# Patient Record
Sex: Female | Born: 1945 | Race: White | Hispanic: No | Marital: Married | Smoking: Former smoker
Health system: Southern US, Community
[De-identification: ages and names within clinical notes are randomized; demographics above are authoritative.]

## PROBLEM LIST (undated history)

## (undated) DIAGNOSIS — F32A Depression, unspecified: Secondary | ICD-10-CM

## (undated) DIAGNOSIS — R011 Cardiac murmur, unspecified: Secondary | ICD-10-CM

## (undated) DIAGNOSIS — C4491 Basal cell carcinoma of skin, unspecified: Secondary | ICD-10-CM

## (undated) DIAGNOSIS — I499 Cardiac arrhythmia, unspecified: Secondary | ICD-10-CM

## (undated) DIAGNOSIS — J40 Bronchitis, not specified as acute or chronic: Secondary | ICD-10-CM

## (undated) DIAGNOSIS — I341 Nonrheumatic mitral (valve) prolapse: Secondary | ICD-10-CM

## (undated) DIAGNOSIS — F329 Major depressive disorder, single episode, unspecified: Secondary | ICD-10-CM

## (undated) DIAGNOSIS — E785 Hyperlipidemia, unspecified: Secondary | ICD-10-CM

## (undated) DIAGNOSIS — F419 Anxiety disorder, unspecified: Secondary | ICD-10-CM

## (undated) DIAGNOSIS — C801 Malignant (primary) neoplasm, unspecified: Secondary | ICD-10-CM

## (undated) DIAGNOSIS — I471 Supraventricular tachycardia: Secondary | ICD-10-CM

## (undated) DIAGNOSIS — I10 Essential (primary) hypertension: Secondary | ICD-10-CM

## (undated) DIAGNOSIS — M1990 Unspecified osteoarthritis, unspecified site: Secondary | ICD-10-CM

## (undated) DIAGNOSIS — C439 Malignant melanoma of skin, unspecified: Secondary | ICD-10-CM

## (undated) DIAGNOSIS — L570 Actinic keratosis: Secondary | ICD-10-CM

## (undated) DIAGNOSIS — Z85820 Personal history of malignant melanoma of skin: Secondary | ICD-10-CM

## (undated) DIAGNOSIS — J45909 Unspecified asthma, uncomplicated: Secondary | ICD-10-CM

## (undated) DIAGNOSIS — A084 Viral intestinal infection, unspecified: Secondary | ICD-10-CM

## (undated) DIAGNOSIS — D229 Melanocytic nevi, unspecified: Secondary | ICD-10-CM

## (undated) HISTORY — DX: Personal history of malignant melanoma of skin: Z85.820

## (undated) HISTORY — DX: Unspecified asthma, uncomplicated: J45.909

## (undated) HISTORY — DX: Anxiety disorder, unspecified: F41.9

## (undated) HISTORY — DX: Viral intestinal infection, unspecified: A08.4

## (undated) HISTORY — DX: Depression, unspecified: F32.A

## (undated) HISTORY — DX: Nonrheumatic mitral (valve) prolapse: I34.1

## (undated) HISTORY — DX: Hyperlipidemia, unspecified: E78.5

## (undated) HISTORY — DX: Essential (primary) hypertension: I10

## (undated) HISTORY — DX: Actinic keratosis: L57.0

## (undated) HISTORY — DX: Supraventricular tachycardia: I47.1

## (undated) HISTORY — DX: Major depressive disorder, single episode, unspecified: F32.9

---
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
## 1973-02-09 HISTORY — PX: ABDOMINAL HYSTERECTOMY: SHX81

## 1974-02-09 HISTORY — PX: ENDOMETRIAL ABLATION: SHX621

## 1978-06-27 LAB — HM PAP SMEAR

## 1997-02-09 HISTORY — PX: MELANOMA EXCISION: SHX5266

## 1997-10-18 DIAGNOSIS — C439 Malignant melanoma of skin, unspecified: Secondary | ICD-10-CM

## 1997-10-18 HISTORY — DX: Malignant melanoma of skin, unspecified: C43.9

## 1997-10-31 ENCOUNTER — Ambulatory Visit (HOSPITAL_BASED_OUTPATIENT_CLINIC_OR_DEPARTMENT_OTHER): Admission: RE | Admit: 1997-10-31 | Discharge: 1997-10-31 | Payer: Self-pay | Admitting: Surgery

## 1999-08-04 ENCOUNTER — Encounter: Payer: Self-pay | Admitting: Obstetrics and Gynecology

## 1999-08-04 ENCOUNTER — Encounter: Admission: RE | Admit: 1999-08-04 | Discharge: 1999-08-04 | Payer: Self-pay | Admitting: Obstetrics and Gynecology

## 2000-08-05 ENCOUNTER — Encounter: Payer: Self-pay | Admitting: Pulmonary Disease

## 2000-08-05 ENCOUNTER — Encounter: Admission: RE | Admit: 2000-08-05 | Discharge: 2000-08-05 | Payer: Self-pay | Admitting: Pulmonary Disease

## 2001-06-10 ENCOUNTER — Encounter: Payer: Self-pay | Admitting: Pulmonary Disease

## 2001-06-10 ENCOUNTER — Encounter: Admission: RE | Admit: 2001-06-10 | Discharge: 2001-06-10 | Payer: Self-pay | Admitting: Pulmonary Disease

## 2001-12-15 ENCOUNTER — Ambulatory Visit (HOSPITAL_COMMUNITY): Admission: RE | Admit: 2001-12-15 | Discharge: 2001-12-15 | Payer: Self-pay

## 2002-06-21 ENCOUNTER — Encounter: Payer: Self-pay | Admitting: Pulmonary Disease

## 2002-06-21 ENCOUNTER — Encounter: Admission: RE | Admit: 2002-06-21 | Discharge: 2002-06-21 | Payer: Self-pay | Admitting: Pulmonary Disease

## 2002-10-23 DIAGNOSIS — C4491 Basal cell carcinoma of skin, unspecified: Secondary | ICD-10-CM

## 2002-10-23 HISTORY — DX: Basal cell carcinoma of skin, unspecified: C44.91

## 2003-05-14 ENCOUNTER — Ambulatory Visit (HOSPITAL_COMMUNITY): Admission: RE | Admit: 2003-05-14 | Discharge: 2003-05-14 | Payer: Self-pay | Admitting: Pulmonary Disease

## 2003-06-27 LAB — HM COLONOSCOPY: HM Colonoscopy: NORMAL

## 2004-04-04 ENCOUNTER — Ambulatory Visit: Payer: Self-pay | Admitting: Gastroenterology

## 2004-04-15 ENCOUNTER — Ambulatory Visit: Payer: Self-pay | Admitting: Gastroenterology

## 2004-04-15 HISTORY — PX: COLONOSCOPY: SHX174

## 2004-06-26 ENCOUNTER — Ambulatory Visit: Payer: Self-pay | Admitting: Pulmonary Disease

## 2005-03-16 ENCOUNTER — Ambulatory Visit: Payer: Self-pay | Admitting: Pulmonary Disease

## 2005-03-20 ENCOUNTER — Ambulatory Visit (HOSPITAL_COMMUNITY): Admission: RE | Admit: 2005-03-20 | Discharge: 2005-03-20 | Payer: Self-pay | Admitting: Pulmonary Disease

## 2005-07-28 ENCOUNTER — Ambulatory Visit: Payer: Self-pay | Admitting: Pulmonary Disease

## 2005-07-28 ENCOUNTER — Ambulatory Visit (HOSPITAL_COMMUNITY): Admission: RE | Admit: 2005-07-28 | Discharge: 2005-07-28 | Payer: Self-pay | Admitting: Pulmonary Disease

## 2005-07-30 ENCOUNTER — Ambulatory Visit: Payer: Self-pay | Admitting: Unknown Physician Specialty

## 2005-08-28 ENCOUNTER — Ambulatory Visit (HOSPITAL_COMMUNITY): Admission: RE | Admit: 2005-08-28 | Discharge: 2005-08-28 | Payer: Self-pay | Admitting: Orthopedic Surgery

## 2006-04-23 ENCOUNTER — Ambulatory Visit: Payer: Self-pay | Admitting: Pulmonary Disease

## 2006-04-23 LAB — CONVERTED CEMR LAB
ALT: 14 units/L (ref 0–40)
Basophils Absolute: 0 10*3/uL (ref 0.0–0.1)
Basophils Relative: 0.5 % (ref 0.0–1.0)
Bilirubin Urine: NEGATIVE
Bilirubin, Direct: 0.1 mg/dL (ref 0.0–0.3)
Calcium: 10.1 mg/dL (ref 8.4–10.5)
Chloride: 104 meq/L (ref 96–112)
Cholesterol: 215 mg/dL (ref 0–200)
Creatinine, Ser: 0.9 mg/dL (ref 0.4–1.2)
Crystals: NEGATIVE
Eosinophils Absolute: 0 10*3/uL (ref 0.0–0.6)
Eosinophils Relative: 0.6 % (ref 0.0–5.0)
GFR calc Af Amer: 82 mL/min
GFR calc non Af Amer: 68 mL/min
Glucose, Bld: 87 mg/dL (ref 70–99)
HCT: 40.7 % (ref 36.0–46.0)
Hemoglobin, Urine: NEGATIVE
Hemoglobin: 14 g/dL (ref 12.0–15.0)
MCHC: 34.5 g/dL (ref 30.0–36.0)
Neutro Abs: 3 10*3/uL (ref 1.4–7.7)
Nitrite: NEGATIVE
RBC: 4.27 M/uL (ref 3.87–5.11)
RDW: 12.1 % (ref 11.5–14.6)
Sodium: 143 meq/L (ref 135–145)
Specific Gravity, Urine: 1.015 (ref 1.000–1.03)
Total CHOL/HDL Ratio: 2.5
Urine Glucose: NEGATIVE mg/dL
Urobilinogen, UA: 0.2 (ref 0.0–1.0)
pH: 7.5 (ref 5.0–8.0)

## 2006-06-02 ENCOUNTER — Ambulatory Visit: Payer: Self-pay | Admitting: Pulmonary Disease

## 2007-06-24 ENCOUNTER — Telehealth (INDEPENDENT_AMBULATORY_CARE_PROVIDER_SITE_OTHER): Payer: Self-pay | Admitting: *Deleted

## 2007-08-19 ENCOUNTER — Encounter: Payer: Self-pay | Admitting: Pulmonary Disease

## 2007-08-26 ENCOUNTER — Encounter: Payer: Self-pay | Admitting: Pulmonary Disease

## 2007-11-10 HISTORY — PX: OTHER SURGICAL HISTORY: SHX169

## 2007-11-30 ENCOUNTER — Ambulatory Visit (HOSPITAL_BASED_OUTPATIENT_CLINIC_OR_DEPARTMENT_OTHER): Admission: RE | Admit: 2007-11-30 | Discharge: 2007-11-30 | Payer: Self-pay | Admitting: *Deleted

## 2008-02-06 ENCOUNTER — Ambulatory Visit: Payer: Self-pay | Admitting: Pulmonary Disease

## 2008-02-06 ENCOUNTER — Telehealth (INDEPENDENT_AMBULATORY_CARE_PROVIDER_SITE_OTHER): Payer: Self-pay | Admitting: *Deleted

## 2008-02-06 DIAGNOSIS — I10 Essential (primary) hypertension: Secondary | ICD-10-CM

## 2008-02-06 DIAGNOSIS — M810 Age-related osteoporosis without current pathological fracture: Secondary | ICD-10-CM

## 2008-02-06 DIAGNOSIS — J42 Unspecified chronic bronchitis: Secondary | ICD-10-CM

## 2008-02-06 DIAGNOSIS — A088 Other specified intestinal infections: Secondary | ICD-10-CM | POA: Insufficient documentation

## 2008-02-06 DIAGNOSIS — Z85820 Personal history of malignant melanoma of skin: Secondary | ICD-10-CM

## 2008-02-06 DIAGNOSIS — Z8679 Personal history of other diseases of the circulatory system: Secondary | ICD-10-CM | POA: Insufficient documentation

## 2008-02-06 DIAGNOSIS — K649 Unspecified hemorrhoids: Secondary | ICD-10-CM | POA: Insufficient documentation

## 2008-02-06 DIAGNOSIS — E785 Hyperlipidemia, unspecified: Secondary | ICD-10-CM | POA: Insufficient documentation

## 2008-02-06 DIAGNOSIS — F489 Nonpsychotic mental disorder, unspecified: Secondary | ICD-10-CM | POA: Insufficient documentation

## 2008-02-06 LAB — CONVERTED CEMR LAB
AST: 23 units/L (ref 0–37)
Albumin: 3.8 g/dL (ref 3.5–5.2)
BUN: 16 mg/dL (ref 6–23)
Basophils Absolute: 0.1 10*3/uL (ref 0.0–0.1)
Basophils Relative: 0.5 % (ref 0.0–3.0)
Bilirubin, Direct: 0.2 mg/dL (ref 0.0–0.3)
Calcium: 9.2 mg/dL (ref 8.4–10.5)
Chloride: 104 meq/L (ref 96–112)
Eosinophils Absolute: 0 10*3/uL (ref 0.0–0.7)
GFR calc Af Amer: 81 mL/min
Monocytes Relative: 4.8 % (ref 3.0–12.0)
Platelets: 143 10*3/uL — ABNORMAL LOW (ref 150–400)
Sed Rate: 22 mm/hr (ref 0–22)
Sodium: 139 meq/L (ref 135–145)
TSH: 1.84 microintl units/mL (ref 0.35–5.50)
Total Bilirubin: 1.2 mg/dL (ref 0.3–1.2)
Total Protein: 7 g/dL (ref 6.0–8.3)

## 2008-04-10 ENCOUNTER — Telehealth (INDEPENDENT_AMBULATORY_CARE_PROVIDER_SITE_OTHER): Payer: Self-pay | Admitting: *Deleted

## 2008-04-10 ENCOUNTER — Telehealth: Payer: Self-pay | Admitting: Pulmonary Disease

## 2008-04-10 ENCOUNTER — Ambulatory Visit: Payer: Self-pay | Admitting: Pulmonary Disease

## 2008-04-10 DIAGNOSIS — J209 Acute bronchitis, unspecified: Secondary | ICD-10-CM

## 2008-06-21 ENCOUNTER — Ambulatory Visit: Payer: Self-pay | Admitting: Pulmonary Disease

## 2008-06-24 LAB — CONVERTED CEMR LAB
AST: 23 units/L (ref 0–37)
Alkaline Phosphatase: 75 units/L (ref 39–117)
Basophils Absolute: 0 10*3/uL (ref 0.0–0.1)
Basophils Relative: 0.9 % (ref 0.0–3.0)
Bilirubin, Direct: 0.1 mg/dL (ref 0.0–0.3)
CO2: 31 meq/L (ref 19–32)
Chloride: 106 meq/L (ref 96–112)
Cholesterol: 193 mg/dL (ref 0–200)
Creatinine, Ser: 0.8 mg/dL (ref 0.4–1.2)
Eosinophils Absolute: 0 10*3/uL (ref 0.0–0.7)
HCT: 40.2 % (ref 36.0–46.0)
LDL Cholesterol: 110 mg/dL — ABNORMAL HIGH (ref 0–99)
Lymphocytes Relative: 31.7 % (ref 12.0–46.0)
Lymphs Abs: 1.3 10*3/uL (ref 0.7–4.0)
MCHC: 33.9 g/dL (ref 30.0–36.0)
MCV: 97.7 fL (ref 78.0–100.0)
Monocytes Absolute: 0.4 10*3/uL (ref 0.1–1.0)
Platelets: 186 10*3/uL (ref 150.0–400.0)
Potassium: 5.1 meq/L (ref 3.5–5.1)
Sed Rate: 19 mm/hr (ref 0–22)
Total Bilirubin: 0.6 mg/dL (ref 0.3–1.2)
Total Protein: 6.8 g/dL (ref 6.0–8.3)

## 2008-08-01 ENCOUNTER — Encounter: Payer: Self-pay | Admitting: Pulmonary Disease

## 2008-08-01 ENCOUNTER — Ambulatory Visit: Payer: Self-pay | Admitting: Internal Medicine

## 2008-10-12 ENCOUNTER — Telehealth: Payer: Self-pay | Admitting: Pulmonary Disease

## 2008-10-24 ENCOUNTER — Telehealth: Payer: Self-pay | Admitting: Pulmonary Disease

## 2008-11-30 ENCOUNTER — Encounter: Payer: Self-pay | Admitting: Pulmonary Disease

## 2008-12-07 ENCOUNTER — Encounter: Payer: Self-pay | Admitting: Pulmonary Disease

## 2008-12-28 ENCOUNTER — Encounter: Payer: Self-pay | Admitting: Pulmonary Disease

## 2008-12-28 HISTORY — PX: NM MYOCAR PERF WALL MOTION: HXRAD629

## 2009-01-05 ENCOUNTER — Emergency Department: Payer: Self-pay | Admitting: Emergency Medicine

## 2009-02-09 HISTORY — PX: WRIST SURGERY: SHX841

## 2009-04-24 ENCOUNTER — Encounter (INDEPENDENT_AMBULATORY_CARE_PROVIDER_SITE_OTHER): Payer: Self-pay | Admitting: *Deleted

## 2009-06-12 ENCOUNTER — Ambulatory Visit: Payer: Self-pay | Admitting: Pulmonary Disease

## 2009-07-10 ENCOUNTER — Ambulatory Visit: Payer: Self-pay | Admitting: Pulmonary Disease

## 2009-07-10 ENCOUNTER — Telehealth (INDEPENDENT_AMBULATORY_CARE_PROVIDER_SITE_OTHER): Payer: Self-pay | Admitting: *Deleted

## 2009-07-11 LAB — CONVERTED CEMR LAB
ALT: 14 units/L (ref 0–35)
Albumin: 4.1 g/dL (ref 3.5–5.2)
Basophils Absolute: 0 10*3/uL (ref 0.0–0.1)
Basophils Relative: 0.6 % (ref 0.0–3.0)
CO2: 30 meq/L (ref 19–32)
Eosinophils Absolute: 0.1 10*3/uL (ref 0.0–0.7)
Glucose, Bld: 83 mg/dL (ref 70–99)
Lymphs Abs: 1.3 10*3/uL (ref 0.7–4.0)
MCV: 94.1 fL (ref 78.0–100.0)
Neutro Abs: 1.7 10*3/uL (ref 1.4–7.7)
Neutrophils Relative %: 46.7 % (ref 43.0–77.0)
Platelets: 187 10*3/uL (ref 150.0–400.0)
Sed Rate: 24 mm/hr — ABNORMAL HIGH (ref 0–22)
Sodium: 141 meq/L (ref 135–145)
TSH: 3.88 microintl units/mL (ref 0.35–5.50)

## 2009-09-16 ENCOUNTER — Telehealth (INDEPENDENT_AMBULATORY_CARE_PROVIDER_SITE_OTHER): Payer: Self-pay | Admitting: *Deleted

## 2009-09-26 ENCOUNTER — Ambulatory Visit: Payer: Self-pay | Admitting: Pulmonary Disease

## 2009-09-28 LAB — CONVERTED CEMR LAB
Cholesterol: 194 mg/dL (ref 0–200)
HDL: 75.9 mg/dL (ref >39.00)
LDL Cholesterol: 110 mg/dL — ABNORMAL HIGH (ref 0–99)
Total CHOL/HDL Ratio: 3
Triglycerides: 42 mg/dL (ref 0.0–149.0)
VLDL: 8.4 mg/dL (ref 0.0–40.0)

## 2009-11-07 ENCOUNTER — Telehealth (INDEPENDENT_AMBULATORY_CARE_PROVIDER_SITE_OTHER): Payer: Self-pay | Admitting: *Deleted

## 2009-11-15 ENCOUNTER — Encounter: Payer: Self-pay | Admitting: Pulmonary Disease

## 2009-11-27 ENCOUNTER — Encounter: Payer: Self-pay | Admitting: Pulmonary Disease

## 2009-12-04 ENCOUNTER — Ambulatory Visit: Payer: Self-pay

## 2009-12-04 ENCOUNTER — Encounter: Payer: Self-pay | Admitting: Pulmonary Disease

## 2010-01-21 ENCOUNTER — Telehealth (INDEPENDENT_AMBULATORY_CARE_PROVIDER_SITE_OTHER): Payer: Self-pay | Admitting: *Deleted

## 2010-02-20 ENCOUNTER — Encounter: Payer: Self-pay | Admitting: Pulmonary Disease

## 2010-03-11 NOTE — Assessment & Plan Note (Signed)
Summary: bronchitis///JJ   CC:  13 month ROV & add-on for recurrent AB....  History of Present Illness: [name] here for a follow up visit... she has multiple medical problems as noted below...     ~  Mar10:  walked in requesting to be seen for "I guess I have bronchitis"...  states 4d hx runny nose w/ clear drainage, croupy cough w/o sputum, HA- all over, sl SOB & sl sharp CP w/ coughing... she notes low grade fever, chills, denies sweats... she is quite fatigued etc... she was at DrMeyerdirks office this AM for f/u of left wrist injury/ tendon surgery & decided to come here to be seen... Rx's Omnicef/ Tussionex/ Mucinex & improved...  ~  Jun 21, 2008:  states she is doing well, no new complaints or concerns...   ~  July 10, 2009:  she saw TP 11mo ago w/ URI- cough, gray mucous, drainage, wheezing, SOB> treated w/ Omnicef, Pred, Zyrtek, Hydromet (never filled this) etc... Improved & almost back to baseline then recurrent symptoms> cough, clear thick phlegm, congestion, no hemoptysis, no CP, no palpit, etc... she is very distraught over these symptoms & we discussed further eval w/ CXR (clear, NAD); and blood work (all essent WNL)... we discussed Rx w/ Depo80, Pred 20mg - 3d taper, Guaifenesin 400mg - 1-2Bid (she was intol to MucinexMax), + rest/ fluids/ Delsym/ etc...    Current Problems:   Hx of BRONCHITIS, RECURRENT (ICD-491.9) - she has a hx of recurrent bronchitic infections... ex-smoker, quit in the 1990's... seen for repeat exac 5/11 & 6/11 as above...  ~  CXR 12/09 showed mild hyperinflation, few chr markings, NAD & stable...  ~  CXR 5/11 showed old left rib fxs, clear, NAD...  HYPERTENSION (ICD-401.9) - on DIOVAN 80mg /d w/ good control... BP = 120/80 today and similar at home, tolerating med well and denies visual changes, CP, palipit, syncope, edema, etc...  MITRAL VALVE PROLAPSE, HX OF (ICD-V12.50) - followed for Cardiology by [facility]... prev intol to Verelan w/ bradycardia... she  has intermittent palpit...  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  ~  last 2DEcho 7/09 showed norm LV size & function, normal valves x mild-mod TR...  ~  EKG 5/10 showed NSR, rsr' V1-2, NAD...  ~  she saw [facility] 10/10 w/ hx palpit/ dizzy> event monitor showed sinus brady, PAC's & short run SVT; he was concerned about poss sinus node dysfunction & continues to follow her closely...  ~  2DEcho 10/10 showed normal LV w/ EF>55%, norm RV, normal valves (no MVP), etc...  ~  Myoview 10/10 showed no evid scar or ischemia, normal LV & EF=73%, no changes...  HYPERCHOLESTEROLEMIA, BORDERLINE (ICD-272.4) - hx mild elevation of cholesterol w/ excellent HDL numbers- on diet alone...  ~  FLP 3/08 showed TChol 215, TG 77, HDL 87, LDL 102  ~  FLP 7/09 by [employer] showed TChol 219, TG 65, HDL 85, LDL 97  ~  FLP 5/10 showed TChol 193, TG 41, HDL 74, LDL 110  HEMORRHOIDS (ICD-455.6) - prev GI eval by DrSam w/ colonoscopy 3/06 showing tortuous colon but essentially WNL...  OSTEOPOROSIS (ICD-733.00) - followed by DrMeisinger for GYN... she is intol to Fosamax and was on Miacalcin nasal spray but stopped this on her own last yr... she fell w/ several "cracked ribs" in 2007- eval & rx by DrDean for Ortho... she refuses Bisphosphonate therapy- "I can't take anything on an empty stomach- I get sick"...  offered Reclast infusion but she inexplicably refuses this Rx as well... she does take calcium and MVI...  ~  BMD here 5/06 showed TScores -1.18 in Spine, and -2.9 in left femoral neck.  ~  labs 5/10 showed Vit D level = 48..,. rec to take OTC Vit D supplement.  ~  BMD here 6/10 showed TScores -1.0 in Spine, and -2.6 in FemNecks> improved on Calcium, MVI, VitD...  PSYCHIATRIC DISORDER (ICD-300.9) - she is on disability from DrPlovsky & take PAXIL 20mg /d,  & ALPRAZOLAM 0.25mg  Bid...  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 she had Derm check by [facility] in 2005 w/ mult AK's frozen w/ liq nitrogen... she tells me that she is followed regularly by DrTafeen...  Health Maintenance - last saw DrMeisinger for GYN in 2008- she is due now... last Mammogram at [facility] in 2008 and due now... she had colonoscopy in 2006 as above...    Preventive Screening-Counseling & Management  Alcohol-Tobacco     Smoking Status: quit  Allergies: 1)  ! Verelan Pm (Verapamil Hcl) 2)  ! Fosamax (Alendronate Sodium) 3)  ! Avapro (Irbesartan)  Comments:  Nurse/Medical Assistant: The patient's medications and allergies were reviewed with the patient and were updated in the Medication and Allergy Lists.  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 emphysema/COPD - father, PGF, brother, sister heart disease - brother, sister, sister, mother, father rheumatism - mother cancer - PGF (lung) stroke - mother   Social History: Reviewed history from 06/12/2009 and no changes required. quit smoking in 1990, x12yrs off-and-on, 1ppd no alcohol married 1 children retired: Audiological scientist  Review of Systems      See HPI       The patient complains of dyspnea on exertion and prolonged cough.  The patient denies anorexia, fever, weight loss, weight gain, vision loss, decreased  hearing, hoarseness, chest pain, syncope, peripheral edema, headaches, hemoptysis, abdominal pain, melena, hematochezia, severe indigestion/heartburn, hematuria, incontinence, muscle weakness, suspicious skin lesions, transient blindness, difficulty walking, depression, unusual weight change, abnormal bleeding, enlarged lymph nodes, and angioedema.    Vital Signs:  Patient profile:   65 year old female Height:      64 inches Weight:      119.31 pounds BMI:     20.55 O2 Sat:      97 % on Room air Temp:     98.3 degrees F oral Pulse rate:   73 / minute BP sitting:   120 / 80  (left arm) Cuff size:   regular  Vitals Entered By: Randell Loop CMA (July 10, 2009 3:24 PM)  O2 Sat at Rest %:  97 O2 Flow:  Room air CC: 13 month ROV & add-on for recurrent AB... Is Patient Diabetic? No Pain Assessment Patient in pain? no      Comments meds updated today   Physical Exam  Additional Exam:  WD, WN, [name] in NAD... she is chr  ill appearing... GENERAL:  Alert & oriented; pleasant & cooperative... HEENT:  [place]/AT, EOM-full, PERRLA, EACs-clear, TMs-wnl, NOSE- sl red, THROAT- sl red w/ dry MM's... NECK:  Supple w/ fairROM; no JVD; normal carotid impulses w/o bruits; no thyromegaly or nodules palpated; no lymphadenopathy. CHEST:  Clear to P & A; without wheezes or rales... few scat rhonchi noted... HEART:  Regular Rhythm; without murmurs/ rubs/ or gallops detected... ABDOMEN:  Soft & nontender; normal bowel sounds; no organomegaly or masses palpated... EXT: without deformities, left wrist splint, mild arthritic changes; no varicose veins/ venous insuffic/ or edema. NEURO:  no focal neuro deficits... DERM:  No lesions noted; no rash etc... scar right post shoulder from melanoma surg...    CXR  Procedure date:  07/10/2009  Findings:      CHEST - 2 VIEW Comparison: 02/06/2008   Findings: Old left fourth and sixth rib fractures. Mild hyperinflation.  Lungs clear.  Heart size and  pulmonary vascularity normal.  No effusion.  Visualized bones unremarkable.   IMPRESSION: No acute disease   Read By:  Corlis Leak. Reuel Boom,  M.D.    MISC. Report  Procedure date:  07/10/2009  Findings:      BMP (METABOL)   Sodium                    141 mEq/L                   135-145   Potassium            [H]  5.4 mEq/L                   3.5-5.1     No visible hemolysis.   Chloride                  106 mEq/L                   96-112   Carbon Dioxide            30 mEq/L                    19-32   Glucose                   83 mg/dL                    16-10   BUN                       14 mg/dL                    9-60   Creatinine                0.9 mg/dL                   4.5-4.0   Calcium                   9.4 mg/dL                   9.8-11.9   GFR                       65.10 mL/min                >60  Hepatic/Liver Function Panel (HEPATIC)   Total Bilirubin  0.4 mg/dL                   1.6-1.0   Direct Bilirubin          0.2 mg/dL                   9.6-0.4   Alkaline Phosphatase      77 U/L                      39-117   AST                       26 U/L                      0-37   ALT                       14 U/L                      0-35   Total Protein             7.1 g/dL                    5.4-0.9   Albumin                   4.1 g/dL                    8.1-1.9  CBC Platelet w/Diff (CBCD)   White Cell Count     [L]  3.6 K/uL                    4.5-10.5   Red Cell Count            4.24 Mil/uL                 3.87-5.11   Hemoglobin                13.7 g/dL                   14.7-82.9   Hematocrit                39.9 %                      36.0-46.0   MCV                       94.1 fl                     78.0-100.0   Platelet Count            187.0 K/uL                  150.0-400.0   Neutrophil %              46.7 %                      43.0-77.0   Lymphocyte %              36.6 %                      12.0-46.0   Monocyte %           [  H]  14.5 %                       3.0-12.0   Eosinophils%              1.6 %                       0.0-5.0   Basophils %               0.6 %        Comments:      TSH (TSH)   FastTSH                   3.88 uIU/mL                 0.35-5.50  Sed Rate (ESR)   Sed Rate             [H]  24 mm/hr                    0-22   Impression & Recommendations:  Problem # 1:  Hx of BRONCHITIS, RECURRENT (ICD-491.9) She has a recurrent bronchitic exac, prob viral w/o purulent phlegm/ fever/ etc... we discussed eval w/ CXR (clear, NAD), and Labs (reviewed- no acute abnormalities)... and Rx w/ Depo80, Pred 20mg - 3d tapering sched, rest, fluids, Guaifenesin- try the 400mg  tabs 1-2 Bid since she doesn't tol the hight doses, etc... Orders: T-2 View CXR (71020TC) TLB-BMP (Basic Metabolic Panel-BMET) (80048-METABOL) TLB-Hepatic/Liver Function Pnl (80076-HEPATIC) TLB-CBC Platelet - w/Differential (85025-CBCD) TLB-TSH (Thyroid Stimulating Hormone) (84443-TSH) TLB-Sedimentation Rate (ESR) (85652-ESR)  Problem # 2:  HYPERTENSION (ICD-401.9) Controlled on med>  continue same. Her updated medication list for this problem includes:    Diovan 80 Mg Tabs (Valsartan) .Marland Kitchen... Take 1 tablet by mouth once a day  Problem # 3:  MITRAL VALVE PROLAPSE, HX OF (ICD-V12.50) Hx palpit/ dizzy followed by DrKelly>  no MVP by latest Echo... may have sinus node dysfuction w/ some bradycardia noted & he is following her closely for any additional problems...  Problem # 4:  HYPERCHOLESTEROLEMIA, BORDERLINE (ICD-272.4) She is maintained on diet Rx alone...  Problem # 5:  OSTEOPOROSIS (ICD-733.00) Her last BMD was improved on her Calcium, MVI, Vit D...  Problem # 6:  PSYCHIATRIC DISORDER (ICD-300.9) She has been followed by DrPlovsky & on disability from him... off Paxil & Alpraz> on LORAZEPAM 1mg  Prn...  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  Complete Medication List: 1)  Aspirin 81 Mg Tbec  (Aspirin) .... Take 1 tab by mouth once daily.Marland KitchenMarland Kitchen 2)  Diovan 80 Mg Tabs (Valsartan) .... Take 1 tablet by mouth once a day 3)  Calcium 600/vitamin D 600-400 Mg-unit Tabs (Calcium carbonate-vitamin d) .... Take 1 tab by mouth two times a day... 4)  Lorazepam 1 Mg Tabs (Lorazepam) .... Take as directed by drplovsky.Marland KitchenMarland Kitchen 5)  Prednisone 20 Mg Tabs (Prednisone) .... Take 1 tab by mouth two times a day for 3d, then 1 tab daily x3d, then 1/2 tab daily til gone... 6)  Guaifenesin 400 Mg Tabs (Guaifenesin) .... Take 2 tabs by mouth two times a day w/ plenty of fluids for hydration.Marland KitchenMarland Kitchen 7)  Delsym 30 Mg/99ml Lqcr (Dextromethorphan polistirex) .... Take 1-2 tsp by mouth two times a day...  Other Orders: Depo- Medrol 80mg  (J1040) Admin of Therapeutic Inj  intramuscular or subcutaneous (54098)  Patient  Instructions: 1)  Today we updated your med list- see below.... 2)  For your refractory asthmatic bronchitis:  rest at home, drink plenty of fluids & try the hot tea w/ lemon & honey to sip...  take the PREDNISONE in the tapering schedule as outlined... use the Guaifenesin 1-2 tabs twice daily w/ the fluids... and the DELSYM 1-2 tsp twice daily for the cough...  finally- take TYLENOL 2 tabs every 4-6H as needed for the aches & pains that accompany this illness... 3)  Today we did a follow up CXR & blood work... please call the "phone tree" in a few days for your lab results.Marland KitchenMarland Kitchen 4)  Call for any questions... Prescriptions: GUAIFENESIN 400 MG TABS (GUAIFENESIN) take 2 tabs by mouth two times a day w/ plenty of fluids for hydration...  #100 x prn   Entered and Authorized by:   Michele Mcalpine MD   Signed by:   Michele Mcalpine MD on 07/10/2009   Method used:   Print then Give to Patient   RxID:   [id number] PREDNISONE 20 MG TABS (PREDNISONE) take 1 tab by mouth two times a day for 3d, then 1 tab daily x3d, then 1/2 tab daily til gone...  #12 x 0   Entered and Authorized by:   Michele Mcalpine MD   Signed by:   Michele Mcalpine MD on 07/10/2009   Method used:   Print then Give to Patient   RxID:   [id number]      Medication Administration  Injection # 1:    Medication: Depo- Medrol 80mg     Diagnosis: ACUTE BRONCHITIS (ICD-466.0)    Route: IM    Site: L deltoid    Exp Date: 12/2011    Lot #: [id number]    Mfr: [employer]    Patient tolerated injection without complications    Given by: Randell Loop CMA (July 10, 2009 4:22 PM)  Orders Added: 1)  Est. Patient Level IV [84696] 2)  T-2 View CXR [71020TC] 3)  TLB-BMP (Basic Metabolic Panel-BMET) [80048-METABOL] 4)  TLB-Hepatic/Liver Function Pnl [80076-HEPATIC] 5)  TLB-CBC Platelet - w/Differential [85025-CBCD] 6)  TLB-TSH (Thyroid Stimulating Hormone) [84443-TSH] 7)  TLB-Sedimentation Rate (ESR) [85652-ESR] 8)  Depo- Medrol 80mg  [J1040] 9)  Admin of Therapeutic Inj  intramuscular or subcutaneous [29528]

## 2010-03-11 NOTE — Progress Notes (Signed)
Summary: want appt  Phone Note Call from Patient Call back at Home Phone [phone]   Caller: Patient Call For: nadel Reason for Call: Talk to Nurse Complaint: Cough/Sore throat, Headache Summary of Call: terrible cough, sob, been going on for a month.  Had antibiotics and still won't go away.  Head hurts, feels funny, gag when she coughs, feels like she needs to vomit.  Want to see SN. Initial call taken by: Eugene Gavia,  July 10, 2009 8:17 AM  Follow-up for Phone Call        called spoke with patient who c/o increased SOB, wheezing, prod cough with whitish slimy mucus, PND with nausea x2days.  pt states that the pred taper and abx did help at last OV.  pt refused appt tomorrow with SN.  per TD, okay to work pt in today at [employer].  pt okay with this time. Boone Master CNA/MA  July 10, 2009 9:35 AM

## 2010-03-11 NOTE — Assessment & Plan Note (Signed)
Summary: Acute NP office visit - bronchitis   CC:  prod cough with thick gray mucus, mostly at night; wheezing, and increased SOB x1week.  History of Present Illness: 65  y/o WF here for a follow up visit & CPX... she has multiple medical problems as noted below...     ~  Mar10:  walked in requesting to be seen for "I guess I have bronchitis"...  states 4d hx runny nose w/ clear drainage, croupy cough w/o sputum, HA- all over, sl SOB & sl sharp CP w/ coughing... she notes low grade fever, chills, denies sweats... she is quite fatigued etc... she was at [facility]Meyerdir[facility] office this AM for f/u of left wrist injury/ tendon surgery & decided to come here to be seen... Rx's Omnicef/ Tussionex/ Mucinex & improved...   ~  Jun 21, 2008:  states she is doing well, no new complaints or concerns...  Jun 12, 2009 --Presents for an acute office visit. Complains of prod cough with thick gray mucus, mostly at night; wheezing, increased SOB x1week. OTC not working. Cough is getting worse, wheezing bad at night. Lots of drainage. Denies chest pain,  orthopnea, hemoptysis, fever, n/v/d, edema, headache.   [name] here for a follow up visit & CPX... she has multiple medical problems as noted below...     ~  Mar10:  walked in requesting to be seen for "I guess I have bronchitis"...  states 4d hx runny nose w/ clear drainage, croupy cough w/o sputum, HA- all over, sl SOB & sl sharp CP w/ coughing... she notes low grade fever, chills, denies sweats... she is quite fatigued etc... she was at [facility] office this AM for f/u of left wrist injury/ tendon surgery & decided to come here to be seen... Rx's Omnicef/ Tussionex/ Mucinex & improved...   ~  Jun 21, 2008:  states she is doing well, no new complaints or concerns...    Current Problems:   Hx of BRONCHITIS, RECURRENT (ICD-491.9) - she has a hx of recurrent bronchitic infections... ex-smoker, quit in the 1990's...  denies sputum, hemoptysis, wheezing, chest  pains, snoring, daytime hypersomnolence, etc...   ~  CXR 12/09 showed mild hyperinflation, few chr markings, NAD & stable...  HYPERTENSION (ICD-401.9) - on AVAPRO 160mg /d w/ good control... BP = 122/70 today and similar at home, tolerating med well and denies visual changes, CP, palipit, syncope, edema, etc...  MITRAL VALVE PROLAPSE, HX OF (ICD-V12.50) - followed for Cardiology by [facility] & last seen in 2009... prev intol to Verelan w/ bradycardia... she has intermittent palpit...  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  ~  last 2DEcho 7/09 showed norm LV size & function, normal valves x mild-mod TR...  ~  EKG 5/10 showed NSR, rsr' V1-2, NAD...  HYPERCHOLESTEROLEMIA, BORDERLINE (ICD-272.4) - hx mild elevation of cholesterol w/ excellent HDL numbers- on diet alone...  ~  FLP 3/08 showed TChol 215, TG 77, HDL 87, LDL 102...  ~  FLP 7/09 by Howell Rucks showed TChol 219, TG 65, HDL 85, LDL 97...  ~  FLP 5/10 showed TChol =     Medications Prior to Update: 1)  Aspirin 81 Mg Tbec (Aspirin) .... Take 1 Tab By Mouth Once Daily.Marland KitchenMarland Kitchen 2)  Diovan 80 Mg Tabs (Valsartan) .... Take 1 Tablet By Mouth Once A Day 3)  Calcium 600/vitamin D 600-400 Mg-Unit Tabs (Calcium Carbonate-Vitamin D) .... Take 1 Tab By Mouth Two Times A Day... 4)  Multiple Vitamin  Tabs (Multiple Vitamin) .... Take 1 Tab By Mouth Once Daily.Marland KitchenMarland Kitchen 5)  Paxil 20 Mg Tabs (Paroxetine Hcl) .... Take As Directed By Drplovsky.Marland KitchenMarland Kitchen 6)  Lorazepam 1 Mg Tabs (Lorazepam) .... Take As Directed By Drplovsky...  Current Medications (verified): 1)  Aspirin 81 Mg Tbec (Aspirin) .... Take 1 Tab By Mouth Once Daily.Marland KitchenMarland Kitchen 2)  Diovan 80 Mg Tabs (Valsartan) .... Take 1 Tablet By Mouth Once A Day 3)  Calcium 600/vitamin D 600-400 Mg-Unit Tabs (Calcium Carbonate-Vitamin D) .... Take 1 Tab By Mouth Two Times A Day... 4)  Lorazepam 1 Mg Tabs (Lorazepam) .... Take As Directed By Drplovsky...  Allergies (verified): 1)  !  Verelan Pm (Verapamil Hcl) 2)  ! Fosamax (Alendronate Sodium) 3)  ! Avapro (Irbesartan)  Past History:  Past Medical History: Last updated: 06/21/2008 Hx of BRONCHITIS, RECURRENT (ICD-491.9) HYPERTENSION (ICD-401.9) MITRAL VALVE PROLAPSE, HX OF (ICD-V12.50) HYPERCHOLESTEROLEMIA, BORDERLINE (ICD-272.4) HEMORRHOIDS (ICD-455.6) OSTEOPOROSIS (ICD-733.00) PSYCHIATRIC DISORDER (ICD-300.9) PERSONAL HISTORY OF MALIGNANT MELANOMA OF SKIN (ICD-V10.82)  Past Surgical History: Last updated: 06/21/2008 S/P surg for endometriosis by Hulda Humphrey in 1976 S/P for superficial spreading melanoma right post shoulder 1999 by DrGruber & DrStreck S/P left thumb tendon repair 10/09 by DrMyerdirks  Family History: Last updated: 06/12/2009 emphysema/COPD - father, PGF, brother, sister heart disease - brother, sister, sister, mother, father rheumatism - mother cancer - PGF (lung) stroke - mother   Social History: Last updated: 06/12/2009 quit smoking in 1990, x63yrs off-and-on, 1ppd no alcohol married 1 children retired: Audiological scientist  Risk Factors: Smoking Status: quit (04/10/2008)  Family History: emphysema/COPD - father, PGF, brother, sister heart disease - brother, sister, sister, mother, father rheumatism - mother cancer - PGF (lung) stroke - mother   Social History: quit smoking in 1990, x17yrs off-and-on, 1ppd no alcohol married 1 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 No Comments Medications reviewed with  patient Daytime contact number verified with patient. Boone Master CNA  Jun 12, 2009 11:52 AM    Physical Exam  Additional Exam:  WD, WN, [name] in NAD... GENERAL:  Alert & oriented; pleasant & cooperative. HEENT:  [place]/AT,   EACs-clear, TMs-wnl, NOSE- sl red, THROAT- sl red w/ dry MM's... NECK:  Supple w/ fairROM; no JVD; normal carotid impulses w/o bruits; no thyromegaly or nodules palpated; no lymphadenopathy. CHEST:  Clear to P & A; without wheezes or rales... few scat rhonchi noted... HEART:  Regular Rhythm; without murmurs/ rubs/ or gallops detected... ABDOMEN:  Soft & nontender; normal bowel sounds; no organomegaly or masses palpated... EXT: without deformities, left wrist splint, mild arthritic changes; no varicose veins/ venous insuffic/ or edema. NEURO:  no focal neuro deficits... DERM:  No lesions noted; no rash etc... scar right post shoulder from melanoma surg...     Impression & Recommendations:  Problem # 1:  ACUTE BRONCHITIS (ICD-466.0)  Omnicef 300mg  two times a day  Prednisone taper over next week.  Hydromet 1/2 -1 tsp every 4-6 as needed cough , may make you sleepy  Zyrtec 10mg  at bedtime as needed drainage.  Mucinex DM two times a day as needed cough/congestion  Increase fluids.  Please contact office for sooner follow up if symptoms do not improve or worsen  Her updated medication list for this problem includes:    Cefdinir 300 Mg Caps (Cefdinir) .Marland Kitchen... 1 by mouth two times a day    Hydromet 5-1.5 Mg/43ml Syrp (Hydrocodone-homatropine) .Marland Kitchen... 1-2 tsp every 4-6 hr as needed cough  Orders: Est. Patient Level IV ([address])  Medications Added to Medication List This Visit: 1)  Cefdinir 300 Mg Caps (Cefdinir) .Marland Kitchen.. 1 by mouth two times a day 2)  Prednisone 10 Mg Tabs (Prednisone) .... 4 tabs for 2 days, then 3 tabs for 2 days, 2 tabs for 2 days, then 1 tab for 2 days, then stop 3)  Hydromet 5-1.5 Mg/22ml Syrp (Hydrocodone-homatropine) .Marland Kitchen.. 1-2 tsp every 4-6 hr as  needed cough  Complete Medication List: 1)  Aspirin 81 Mg Tbec (Aspirin) .... Take 1 tab by mouth once daily.Marland KitchenMarland Kitchen 2)  Diovan 80 Mg Tabs (Valsartan) .... Take 1 tablet by mouth once a day 3)  Calcium 600/vitamin D 600-400 Mg-unit Tabs (Calcium carbonate-vitamin d) .... Take 1 tab by mouth two times a day... 4)  Lorazepam 1 Mg Tabs (Lorazepam) .... Take as directed by drplovsky.Marland KitchenMarland Kitchen 5)  Cefdinir 300 Mg Caps (Cefdinir) .Marland Kitchen.. 1 by mouth two times a day 6)  Prednisone 10 Mg Tabs (Prednisone) .... 4 tabs for 2 days, then 3 tabs for 2 days, 2 tabs for 2 days, then 1 tab for 2 days, then stop 7)  Hydromet 5-1.5 Mg/44ml Syrp (Hydrocodone-homatropine) .Marland Kitchen.. 1-2 tsp every 4-6 hr as needed cough  Patient Instructions: 1)  Omnicef 300mg  two times a day  2)  Prednisone taper over next week.  3)  Hydromet 1/2 -1 tsp every 4-6 as needed cough , may make you sleepy  4)  Zyrtec 10mg  at bedtime as needed drainage.  5)  Mucinex DM two times a day as needed cough/congestion  6)  Increase fluids.  7)  Please contact office for sooner follow up if symptoms do not improve or worsen  Prescriptions: HYDROMET 5-1.5 MG/5ML SYRP (HYDROCODONE-HOMATROPINE) 1-2 tsp every 4-6 hr as needed cough  #8 oz x 0   Entered and Authorized by:   Rubye Oaks NP   Signed by:   Tammy Parrett NP on 06/12/2009   Method used:   Print then Give to Patient   RxID:   [id number] PREDNISONE 10 MG TABS (PREDNISONE) 4 tabs for 2 days, then 3 tabs for 2 days, 2 tabs for 2 days, then 1 tab for 2 days, then stop  #20 x 0   Entered and Authorized by:   Rubye Oaks NP   Signed by:   Tammy Parrett NP on 06/12/2009   Method used:   Electronically to        [facility] [address] (retail)       [address], [address]       [place], [place]  [address]       Ph: [phone]       Fax: [phone]   RxID:   [id number] CEFDINIR 300 MG CAPS (CEFDINIR) 1 by mouth two  times a day  #14 x 0   Entered and  Authorized by:   Rubye Oaks NP   Signed by:   Tammy Parrett NP on 06/12/2009   Method used:   Electronically to        [facility] [address] (retail)       [address], [address]       [place], [place]  [address]       Ph: [phone]       Fax: [phone]   RxID:   [id number]    Immunization History:  Influenza Immunization History:    Influenza:  historical (02/09/2009)

## 2010-03-11 NOTE — Miscellaneous (Signed)
Summary: Flu Vaccine / [facility]  Flu Vaccine / [facility]   Imported By: Lennie Odor 12/05/2009 11:30:16  _____________________________________________________________________  External Attachment:    Type:   Image     Comment:   External Document

## 2010-03-11 NOTE — Progress Notes (Signed)
Summary: bloodwork  Phone Note Call from Patient Call back at Home Phone [phone]   Caller: Patient Call For: nadel Reason for Call: Talk to Nurse Summary of Call: pt has appt on 08/18 - would like blood work order put in system so she can come in before 9 and have it drawn. Initial call taken by: Eugene Gavia,  September 16, 2009 8:33 AM  Follow-up for Phone Call        Patient aware to come fasting for appt on 8/18  @ 9am with SN and labs will be drawn after she is seen that day.  Follow-up by: Michel Bickers CMA,  September 16, 2009 10:09 AM

## 2010-03-11 NOTE — Assessment & Plan Note (Signed)
Summary: cpx/fasting/apc   CC:  2 month ROV & review of mult medical problems....  History of Present Illness: [name] here for a follow up visit... she has multiple medical problems as noted below...     She has hx of  recurrent bronchitic infections, HBP, MVP, Borderline cholesterol, Osteoporosis, Hx of melanoma, and psyche disorder followed by DrPlovsky (on disability for this)...  She is also followed by [facility] for Cards- HBP, MVP, & palpit w/ prev Holter monitor that she reports as neg, but notes that she was allergic to the pads w/ blisters ("from both types")...   ~  July 10, 2009:  she saw TP 36mo ago w/ URI- cough, gray mucous, drainage, wheezing, SOB> treated w/ Omnicef, Pred, Zyrtek, Hydromet (never filled this) etc... Improved & almost back to baseline then recurrent symptoms> cough, clear thick phlegm, congestion, no hemoptysis, no CP, no palpit, etc... she is very distraught over these symptoms & we discussed further eval w/ CXR (clear, NAD); and blood work (all essent WNL)... we discussed Rx w/ Depo80, Pred 20mg - 3d taper, Guaifenesin 400mg - 1-2Bid (she was intol to MucinexMax), + rest/ fluids/ Delsym/ etc >> all symptoms resolved.   ~  September 26, 2009:  she notes all her prev symptoms resolved & back to baseline... BP controlled on diovan & needs refill... due for f/u FLP on diet alone, also due for f/u w/ GYN & Mammogram...  we discussed taking ASA 81mg /d...    Current Problems:   Hx of BRONCHITIS, RECURRENT (ICD-491.9) - she has a hx of recurrent bronchitic infections... ex-smoker, quit in the 1990's... seen for repeat exac 5/11 & 6/11 as above...  ~  CXR 12/09 showed mild hyperinflation, few chr markings, NAD & stable...  ~  CXR 5/11 showed old left rib fxs, clear, NAD...  HYPERTENSION (ICD-401.9) - on DIOVAN 80mg /d w/ good control... BP = 120/80 today and similar at home, tolerating med well and denies visual changes, CP, syncope, edema, etc... she notes occas palpit &  EKG shows PAC.  MITRAL VALVE PROLAPSE, HX OF (ICD-V12.50) - followed for Cardiology by [facility]... prev intol to Verelan w/ bradycardia... she has intermittent palpit...  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  ~  last 2DEcho 7/09 showed norm LV size & function, normal valves x mild-mod TR...  ~  EKG 5/10 showed NSR, rsr' V1-2, NAD...  ~  she saw [facility] 10/10 w/ hx palpit/ dizzy> event monitor showed sinus brady, PAC's & short run SVT; he was concerned about poss sinus node dysfunction & continues to follow her closely...  ~  2DEcho 10/10 showed normal LV w/ EF>55%, norm RV, normal valves (no MVP), etc...  ~  Myoview 10/10 showed no evid scar or ischemia, normal LV & EF=73%, no changes...  ~  EKG 8/11 showed NSR, rare PAC, otherw WNL...  HYPERCHOLESTEROLEMIA, BORDERLINE (ICD-272.4) - hx mild elevation of cholesterol w/ excellent HDL numbers- on diet alone...  ~  FLP 3/08 showed TChol 215, TG 77, HDL 87, LDL 102  ~  FLP 7/09 by [facility] showed TChol 219, TG 65, HDL 85, LDL 97  ~  FLP 5/10 showed TChol 193, TG 41, HDL 74, LDL 110  ~  FLP 8/11 showed TChol 194, TG 42, HDL 76, LDL 110  HEMORRHOIDS (ICD-455.6) - prev GI eval by [facility] w/ colonoscopy 3/06 showing tortuous colon but essentially WNL...  OSTEOPOROSIS (ICD-733.00) - followed by DrMeisinger for GYN... she is  intol to Fosamax and was on Miacalcin nasal spray but stopped this on her own last yr... she fell w/ several "cracked ribs" in 2007- eval & rx by [facility] for Ortho... she refuses Bisphosphonate therapy- "I can't take anything on an empty stomach- I get sick"... offered Reclast infusion but she inexplicably refuses this Rx as well... she does take calcium and MVI...  ~  BMD here 5/06 showed TScores -1.18 in Spine, and -2.9 in left femoral neck.  ~  labs 5/10 showed Vit D level = 48..,. rec to take OTC Vit D supplement.  ~  BMD here 6/10 showed TScores -1.0 in Spine, and -2.6 in FemNecks>  improved on Calcium, MVI, VitD...  PSYCHIATRIC DISORDER (ICD-300.9) - she is on disability from DrPlovsky & take PAXIL 20mg /d,  & ALPRAZOLAM 0.25mg  Bid...  PERSONAL HISTORY OF MALIGNANT MELANOMA OF SKIN (ICD-V10.82) - lesion removed right post shoulder area by [facility]/ [facility] in 1999 (it was a level I lesion- completely excised)... she had Derm check by [facility] in 2005 w/ mult AK's frozen w/ liq nitrogen... she tells me that she is followed regularly by [facility] & everything has been OK.  Health Maintenance - last saw DrMeisinger for GYN in 2008- she is due now... last Mammogram at [facility] in 2008 and due now... she had colonoscopy in 2006 as above...    Preventive Screening-Counseling & Management  Alcohol-Tobacco     Smoking Status: quit     Packs/Day: 1 ppd     Year Quit: 1990's  Allergies: 1)  ! Verelan Pm (Verapamil Hcl) 2)  ! Fosamax (Alendronate Sodium) 3)  ! Avapro (Irbesartan)  Comments:  Nurse/Medical Assistant: The patient's medications and allergies were reviewed with the patient and were updated in the Medication and Allergy Lists.  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 emphysema/COPD - father, PGF, brother, sister heart disease - brother, sister, sister, mother, father rheumatism - mother cancer - PGF (lung) stroke - mother  Social History: Reviewed history from 06/12/2009 and no  changes required. 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  The patient denies anorexia, fever, weight loss, weight gain, vision loss, decreased hearing, hoarseness, chest pain, syncope, peripheral edema, prolonged cough, headaches, hemoptysis, abdominal pain, melena, hematochezia, severe indigestion/heartburn, hematuria, incontinence, suspicious skin lesions, transient blindness, difficulty walking, depression, unusual weight change, abnormal bleeding, enlarged lymph nodes, and angioedema.    Vital Signs:  Patient profile:   65 year old female Height:      64 inches Weight:      124.13 pounds BMI:     21.38 O2 Sat:      92 % on Room air Temp:     98.7 degrees F oral Pulse rate:   59 / minute BP sitting:   124 / 76  (right arm) Cuff size:   regular  Vitals Entered  By: Randell Loop CMA (September 26, 2009 8:53 AM)  O2 Sat at Rest %:  92 O2 Flow:  Room air CC: 2 month ROV & review of mult medical problems... Is Patient Diabetic? No Pain Assessment Patient in pain? no      Comments meds updated today with pt   Physical Exam  Additional Exam:  WD, WN, [name] in NAD... she is chr ill appearing... GENERAL:  Alert & oriented; pleasant & cooperative... HEENT:  [place]/AT, EOM-full, PERRLA, EACs-clear, TMs-wnl, NOSE- sl red, THROAT- sl red w/ dry MM's... NECK:  Supple w/ fairROM; no JVD; normal carotid impulses w/o bruits; no thyromegaly or nodules palpated; no lymphadenopathy. CHEST:  Clear to P & A; without wheezes or rales... few scat rhonchi noted... HEART:  Regular Rhythm; without murmurs/ rubs/ or gallops detected... ABDOMEN:  Soft & nontender; normal bowel sounds; no organomegaly or masses palpated... EXT: without deformities, left wrist splint, mild arthritic changes; no varicose veins/ venous  insuffic/ or edema. NEURO:  no focal neuro deficits... DERM:  No lesions noted; no rash etc... scar right post shoulder from melanoma surg...    EKG  Procedure date:  09/26/2009  Findings:      Sinus bradycardia with rate of:  54/min w/ one PAC... Tracing is otherw WNL, NAD...  SN   MISC. Report  Procedure date:  09/26/2009  Findings:      Lipid Panel (LIPID)   Cholesterol               194 mg/dL                   2-725   Triglycerides             42.0 mg/dL                  3.6-644.0   HDL                       34.74 mg/dL                 >25.95   LDL Cholesterol      [H]  638 mg/dL                   7-56   Impression & Recommendations:  Problem # 1:  Hx of BRONCHITIS, RECURRENT (ICD-491.9) Prev symptoms resolved & back to baseline...  Problem # 2:  HYPERTENSION (ICD-401.9) Controlled>  same med, refill Diovan. Her updated medication list for this problem includes:    Diovan 80 Mg Tabs (Valsartan) .Marland Kitchen... Take 1 tablet by mouth once a day  Problem # 3:  MITRAL VALVE PROLAPSE, HX OF (ICD-V12.50) Followed by Howell Rucks for [facility]...  Problem # 4:  HYPERCHOLESTEROLEMIA, BORDERLINE (ICD-272.4) Stable on diet Rx... Orders: TLB-Lipid Panel (80061-LIPID)  Problem # 5:  OSTEOPOROSIS (ICD-733.00) Followed by drMeisinger & due for f/u GYN eval... she has refused Bisphos Rx.  Problem # 6:  PSYCHIATRIC DISORDER (ICD-300.9) Followed by DrPlovsky...  Problem # 7:  OTHER MEDICAL PROBLEMS AS NOTED>>>  Complete Medication List: 1)  Aspirin 81 Mg Tbec (Aspirin) .... Take 1 tab by mouth once daily.Marland KitchenMarland Kitchen 2)  Diovan 80 Mg Tabs (Valsartan) .... Take 1 tablet by mouth once a day 3)  Calcium 600/vitamin D 600-400 Mg-unit Tabs (Calcium carbonate-vitamin d) .... Take 1 tab by mouth two times a day... 4)  Lorazepam 1 Mg Tabs (Lorazepam) .... Take as directed by drplovsky...  Other Orders: EKG  w/ Interpretation (93000)  Patient Instructions: 1)  Today we updated your med list- see  below.... 2)  We refilled your Diovan Rx... 3)  Today we did your follow up Fasting Lipid Profile... please call the "phone tree" in a few days for your lab results.Marland KitchenMarland Kitchen 4)  Remember to take an 81mg  buffered or coated aspirin daily.Marland KitchenMarland Kitchen 5)  Call for any questions.Marland KitchenMarland Kitchen 6)  Please schedule a follow-up appointment in 1 year, sooner as needed... Prescriptions: DIOVAN 80 MG TABS (VALSARTAN) Take 1 tablet by mouth once a day  #30 x 12   Entered and Authorized by:   Michele Mcalpine MD   Signed by:   Michele Mcalpine MD on 09/26/2009   Method used:   Print then Give to Patient   RxID:   [id number]      CardioPerfect ECG  ID: [id number] Patient: [facility] DOB: 11-08-1945 Age: 65 Years Old Sex: Female Race: White Physician: scott nadel Technician: Randell Loop CMA Height: 64 Weight: 124.13 Status: Unconfirmed Past Medical History:  Hx of BRONCHITIS, RECURRENT (ICD-491.9) HYPERTENSION (ICD-401.9) MITRAL VALVE PROLAPSE, HX OF (ICD-V12.50) HYPERCHOLESTEROLEMIA, BORDERLINE (ICD-272.4) HEMORRHOIDS (ICD-455.6) OSTEOPOROSIS (ICD-733.00) PSYCHIATRIC DISORDER (ICD-300.9) PERSONAL HISTORY OF MALIGNANT MELANOMA OF SKIN (ICD-V10.82)  Recorded: 09/26/2009 09:06 AM P/PR: 93 ms / 125 ms - Heart rate (maximum exercise) QRS: 80 QT/QTc/QTd: 455 ms / 443 ms / 25 ms - Heart rate (maximum exercise)  P/QRS/T axis: 63 deg / 60 deg / 68 deg - Heart rate (maximum exercise)  Heartrate: 53 bpm  Interpretation:  Sinus bradycardia with rate of:  54/min w/ one PAC... Tracing is otherw WNL, NAD...  SN

## 2010-03-11 NOTE — Progress Notes (Signed)
Summary: order for mammogram  Phone Note Call from Patient   Caller: Patient Call For: nadel Summary of Call: pt would like an order sent to [facility] breast care clinic. phone # [phone] Initial call taken by: Rickard Patience,  November 07, 2009 10:03 AM  Follow-up for Phone Call        [facility].  Just need to get more info on why she wants order.  Aundra Millet Reynolds LPN  November 07, 2009 10:25 AM   called and spoke with pt.  pt states she would like an order sent to [facility] to have her routine mammogram done there. ( pt denied any hx of breast ca in her family)  Pt states she lives in [place] and is easier for her to go to [facility] instead of coming to [place].  Pt would like appt for mammogram on any day but Mondays or Fridays Will forward message to SN to address. Arman Filter LPN  November 11, 2009 9:46 AM    Additional Follow-up for Phone Call Additional follow up Details #1::        per SN----ok for order to be sent to [facility] for routine mammogram.   thanks Randell Loop CMA  November 11, 2009 9:53 AM     Additional Follow-up for Phone Call Additional follow up Details #2::    Order was sent to [facility].  Follow-up by: Vernie Murders,  November 11, 2009 10:48 AM

## 2010-03-11 NOTE — Miscellaneous (Signed)
Summary: Orders approval process/[facility]  Orders approval process/[facility]   Imported By: Lester Tescott 11/19/2009 10:38:10  _____________________________________________________________________  External Attachment:    Type:   Image     Comment:   External Document

## 2010-03-11 NOTE — Letter (Signed)
Summary: Colonoscopy Letter  [facility]  [address] [place], [place] [address]   Phone: [phone]  Fax: [phone]      April 24, 2009 MRN: [id number]   [facility] [address] [place], [place]  [address]   Dear Ms. Mcglown,   According to your medical record, it is time for you to schedule a Colonoscopy. The American Cancer Society recommends this procedure as a method to detect early colon cancer. Patients with a family history of colon cancer, or a personal history of colon polyps or inflammatory bowel disease are at increased risk.  This letter has beeen generated based on the recommendations made at the time of your procedure. If you feel that in your particular situation this may no longer apply, please contact our office.  Please call our office at [phone] to schedule this appointment or to update your records at your earliest convenience.  Thank you for cooperating with [place] to provide you with the very best care possible.   Sincerely,  Barbette Hair. Arlyce Dice, M.D.  [facility] [phone]  Appended Document: Colonoscopy Letter pulled chart and reviewed Dr. Nita Sells recall sheet and he states patient due for recall colonoscopy in 03/2014   Clinical Lists Changes  Observations: Added new observation of COLONNXTDUE: 03/2014 (11/14/2009 15:52)

## 2010-03-13 NOTE — Progress Notes (Signed)
  Phone Note Other Incoming   Request: Send information Summary of Call: Patient needs records. I advised her that she would need to sign a Medical Release. 2 release forms were mailed to [address] [place], [place] [address]

## 2010-03-27 NOTE — Letter (Signed)
Summary: [facility]  [facility]   Imported By: Sherian Rein 03/17/2010 10:04:02  _____________________________________________________________________  External Attachment:    Type:   Image     Comment:   External Document

## 2010-04-07 ENCOUNTER — Telehealth (INDEPENDENT_AMBULATORY_CARE_PROVIDER_SITE_OTHER): Payer: Self-pay | Admitting: *Deleted

## 2010-04-07 ENCOUNTER — Other Ambulatory Visit: Payer: Self-pay | Admitting: Pulmonary Disease

## 2010-04-07 ENCOUNTER — Encounter: Payer: Self-pay | Admitting: Pulmonary Disease

## 2010-04-07 ENCOUNTER — Other Ambulatory Visit: Payer: Medicare Other

## 2010-04-07 ENCOUNTER — Ambulatory Visit (INDEPENDENT_AMBULATORY_CARE_PROVIDER_SITE_OTHER)
Admission: RE | Admit: 2010-04-07 | Discharge: 2010-04-07 | Disposition: A | Discharge status: Home / Self Care | Payer: Medicare Other | Source: Ambulatory Visit | Attending: Pulmonary Disease | Admitting: Pulmonary Disease

## 2010-04-07 ENCOUNTER — Ambulatory Visit (INDEPENDENT_AMBULATORY_CARE_PROVIDER_SITE_OTHER): Payer: Medicare Other | Admitting: Pulmonary Disease

## 2010-04-07 DIAGNOSIS — J42 Unspecified chronic bronchitis: Secondary | ICD-10-CM

## 2010-04-07 DIAGNOSIS — J209 Acute bronchitis, unspecified: Secondary | ICD-10-CM

## 2010-04-07 DIAGNOSIS — M810 Age-related osteoporosis without current pathological fracture: Secondary | ICD-10-CM

## 2010-04-07 DIAGNOSIS — Z8679 Personal history of other diseases of the circulatory system: Secondary | ICD-10-CM

## 2010-04-07 DIAGNOSIS — E785 Hyperlipidemia, unspecified: Secondary | ICD-10-CM

## 2010-04-07 DIAGNOSIS — I10 Essential (primary) hypertension: Secondary | ICD-10-CM

## 2010-04-07 DIAGNOSIS — F489 Nonpsychotic mental disorder, unspecified: Secondary | ICD-10-CM

## 2010-04-07 DIAGNOSIS — Z85820 Personal history of malignant melanoma of skin: Secondary | ICD-10-CM

## 2010-04-07 LAB — CBC WITH DIFFERENTIAL/PLATELET
Hemoglobin: 13.8 g/dL (ref 12.0–15.0)
Lymphocytes Relative: 21.8 % (ref 12.0–46.0)
MCV: 94.4 fl (ref 78.0–100.0)
Monocytes Absolute: 1 10*3/uL (ref 0.1–1.0)
Platelets: 218 10*3/uL (ref 150.0–400.0)
RBC: 4.24 Mil/uL (ref 3.87–5.11)
RDW: 12.7 % (ref 11.5–14.6)
WBC: 9.6 10*3/uL (ref 4.5–10.5)

## 2010-04-07 LAB — BASIC METABOLIC PANEL
CO2: 28 mEq/L (ref 19–32)
Calcium: 9.4 mg/dL (ref 8.4–10.5)
Glucose, Bld: 77 mg/dL (ref 70–99)
Potassium: 5.4 mEq/L — ABNORMAL HIGH (ref 3.5–5.1)
Sodium: 139 mEq/L (ref 135–145)

## 2010-04-07 LAB — HEPATIC FUNCTION PANEL
ALT: 9 U/L (ref 0–35)
Albumin: 4.1 g/dL (ref 3.5–5.2)
Bilirubin, Direct: 0.1 mg/dL (ref 0.0–0.3)
Total Protein: 7.3 g/dL (ref 6.0–8.3)

## 2010-04-07 LAB — TSH: TSH: 5.12 u[IU]/mL (ref 0.35–5.50)

## 2010-04-17 NOTE — Assessment & Plan Note (Signed)
Summary: SOB, cough, fever / cj   CC:  6 month ROV & add-on for bronchitis....  History of Present Illness: [name] here for a follow up visit...     She has hx of  recurrent bronchitic infections, HBP, ?MVP, Borderline cholesterol, Osteoporosis, Hx of melanoma, and psyche disorder followed by DrPlovsky (on disability for this).    She is also followed by [facility] for Cards- HBP, ?MVP, & palpit w/ prev Holter monitor that she reports as neg, but notes that she was allergic to the pads w/ blisters ("from both types").   ~  July 10, 2009:  she saw TP 9mo ago w/ URI- cough, gray mucous, drainage, wheezing, SOB> treated w/ Omnicef, Pred, Zyrtek, Hydromet (never filled this) etc... Improved & almost back to baseline then recurrent symptoms> cough, clear thick phlegm, congestion, no hemoptysis, no CP, no palpit, etc... she is very distraught over these symptoms & we discussed further eval w/ CXR (clear, NAD); and blood work (all essent WNL)... we discussed Rx w/ Depo80, Pred 20mg - 3d taper, Guaifenesin 400mg - 1-2Bid (she was intol to MucinexMax), + rest/ fluids/ Delsym/ etc >> all symptoms resolved.   ~  April 07, 2010:  47mo ROV & add-on for recurrent bronchitic symptoms> started w/ a "cold" c/o dry nonproductive cough, low grade temp to 100, SOB, chest discomfort across the costal margins from the coughing, & incr SOB "like I can't get a DB";  also notes weak & BP noted to be running low 100/64 today in office:  **we discussed further eval w/ CXR (clear, NAD); and Labs (all WNL);  Rx w/ Depo80, Pred taper, ZPak, Mucinex, Hydromet, rest, fluids, etc;  hold Diovan in light of low BP til home checks improve.    Pt saw [facility] for Cards f/u 1/12 & stable> hx palpit, norm Myoview 2005 but poor aerobic capacity noted, neg 2DEcho 10/10 w/o MVP seen (& norm LVF), subseq f/u Myoview 11/10 also reported normal...   Current Problems:   Hx of BRONCHITIS, RECURRENT (ICD-491.9) - she has a hx of recurrent  bronchitic infections... ex-smoker, quit in the 1990's... seen for repeat exac 5/11 & 6/11 as above...  ~  CXR 12/09 showed mild hyperinflation, few chr markings, NAD & stable...  ~  CXR 5/11 showed old left rib fxs, clear, NAD...  HYPERTENSION (ICD-401.9) - on DIOVAN 80mg /d w/ good control... BP = 120/80 today and similar at home, tolerating med well and denies visual changes, CP, syncope, edema, etc... she notes occas palpit & EKG shows PAC.  MITRAL VALVE PROLAPSE, HX OF (ICD-V12.50) - followed for Cardiology by [facility]... prev intol to Verelan w/ bradycardia... she has intermittent palpit...  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  ~  last 2DEcho 7/09 showed norm LV size & function, normal valves x mild-mod TR...  ~  EKG 5/10 showed NSR, rsr' V1-2, NAD...  ~  she saw [facility] 10/10 w/ hx palpit/ dizzy> event monitor showed sinus brady, PAC's & short run SVT; he was concerned about poss sinus node dysfunction & continues to follow her closely...  ~  2DEcho 10/10 showed normal LV w/ EF>55%, norm RV, normal valves (no MVP), etc...  ~  Myoview 10/10 showed no evid scar or ischemia, normal LV & EF=73%, no changes...  ~  EKG 8/11 showed NSR, rare PAC, otherw WNL...  HYPERCHOLESTEROLEMIA, BORDERLINE (ICD-272.4) - hx mild elevation of cholesterol w/ excellent HDL numbers- on diet alone...  ~  FLP 3/08 showed TChol 215, TG 77, HDL 87, LDL 102  ~  FLP 7/09 by [facility] showed TChol 219, TG 65, HDL 85, LDL 97  ~  FLP 5/10 showed TChol 193, TG 41, HDL 74, LDL 110  ~  FLP 8/11 showed TChol 194, TG 42, HDL 76, LDL 110  HEMORRHOIDS (ICD-455.6) - prev GI eval by DrSam w/ colonoscopy 3/06 showing tortuous colon but essentially WNL...  OSTEOPOROSIS (ICD-733.00) - followed by DrMeisinger for GYN... she is intol to Fosamax and was on Miacalcin nasal spray but stopped this on her own last yr... she fell w/ several "cracked ribs" in 2007- eval & rx by DrDean for  Ortho... she refuses Bisphosphonate therapy- "I can't take anything on an empty stomach- I get sick"... offered Reclast infusion but she inexplicably refuses this Rx as well... she does take calcium and MVI...  ~  BMD here 5/06 showed TScores -1.18 in Spine, and -2.9 in left femoral neck.  ~  labs 5/10 showed Vit D level = 48..,. rec to take OTC Vit D supplement.  ~  BMD here 6/10 showed TScores -1.0 in Spine, and -2.6 in FemNecks> improved on Calcium, MVI, VitD...  PSYCHIATRIC DISORDER (ICD-300.9) - she is on disability from DrPlovsky & take PAXIL 20mg /d,  & ALPRAZOLAM 0.25mg  Bid...  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 she had Derm check by [facility] in 2005 w/ mult AK's frozen w/ liq nitrogen... she tells me that she is followed regularly by [facility] & everything has been OK.  Health Maintenance - last saw DrMeisinger for GYN in 2008- she is due now... last Mammogram at [facility] in 2008 and due now... she had colonoscopy in 2006 as above...    Preventive Screening-Counseling & Management  Alcohol-Tobacco     Smoking Status: quit     Packs/Day: 1 ppd     Year Quit: 1990's  Allergies: 1)  ! Verelan Pm (Verapamil Hcl) 2)  ! Fosamax (Alendronate Sodium) 3)  ! Avapro (Irbesartan)  Comments:  Nurse/Medical Assistant: The patient's medications and allergies were reviewed with the patient and were updated in the Medication and Allergy Lists.  Past History:  Past Medical History: Hx of BRONCHITIS, RECURRENT (ICD-491.9) HYPERTENSION (ICD-401.9) MITRAL VALVE PROLAPSE, HX OF (ICD-V12.50) HYPERCHOLESTEROLEMIA, BORDERLINE (ICD-272.4) HEMORRHOIDS (ICD-455.6) OSTEOPOROSIS (ICD-733.00) PSYCHIATRIC DISORDER (ICD-300.9) PERSONAL HISTORY OF MALIGNANT MELANOMA OF SKIN (ICD-V10.82)  Past Surgical History: S/P surg for endometriosis by Hulda Humphrey in  1976 S/P for superficial spreading melanoma right post shoulder 1999 by DrGruber & DrStreck S/P left thumb tendon repair 10/09 by DrMyerdirks  Family History: Reviewed history from 09/26/2009 and no changes required. emphysema/COPD - father, PGF, brother, sister heart disease - brother, sister, sister, mother, father rheumatism - mother cancer - PGF (lung) stroke - mother  Social History: Reviewed history from 09/26/2009 and no changes required. quit smoking in 1990, x87yrs off-and-on, 1ppd no alcohol married 1 children retired: Audiological scientist  Review of Systems      See HPI       The patient complains of dyspnea on exertion, prolonged cough, and muscle weakness.  The patient denies anorexia, fever, weight loss, weight gain, vision loss, decreased hearing, hoarseness, chest pain, syncope, peripheral edema, headaches, hemoptysis, abdominal pain, melena, hematochezia, severe indigestion/heartburn, hematuria, incontinence, suspicious skin lesions, transient blindness, difficulty walking, depression, unusual weight change, abnormal bleeding, enlarged lymph nodes, and angioedema.  Vital Signs:  Patient profile:   65 year old female Height:      64 inches Weight:      120.13 pounds BMI:     20.69 O2 Sat:      99 % on Room air Temp:     98.4 degrees F oral Pulse rate:   69 / minute BP sitting:   100 / 64  (right arm) Cuff size:   regular  Vitals Entered By: Randell Loop CMA (April 07, 2010 2:27 PM)  O2 Sat at Rest %:  99 O2 Flow:  Room air CC: 6 month ROV & add-on for bronchitis... Is Patient Diabetic? No Pain Assessment Patient in pain? yes      Onset of pain  lower chest discomfort from coughing Comments no changes in meds today   Physical Exam  Additional Exam:  WD, WN, [name] in NAD... she is chr ill appearing... GENERAL:  Alert & oriented; pleasant & cooperative... HEENT:  [place]/AT, EOM-full, PERRLA, EACs-clear, TMs-wnl, NOSE- sl red, THROAT- sl red w/ dry  MM's... NECK:  Supple w/ fairROM; no JVD; normal carotid impulses w/o bruits; no thyromegaly or nodules palpated; no lymphadenopathy. CHEST:  Clear to P & A; without wheezes or rales... few scat rhonchi noted... HEART:  Regular Rhythm; without murmurs/ rubs/ or gallops detected... ABDOMEN:  Soft & nontender; normal bowel sounds; no organomegaly or masses palpated... EXT: without deformities, left wrist splint, mild arthritic changes; no varicose veins/ venous insuffic/ or edema. NEURO:  no focal neuro deficits... DERM:  No lesions noted; no rash etc... scar right post shoulder from melanoma surg...    Impression & Recommendations:  Problem # 1:  ACUTE BRONCHITIS (ICD-466.0) We discussed AB & Rx similar to prev that has worked so well for her>  Depo80, Pred taper, ZPak, Mucinex, Hydromet, rest fluids, etc... Her updated medication list for this problem includes:    Zithromax Z-pak 250 Mg Tabs (Azithromycin) .Marland Kitchen... Take as directed...    Hydromet 5-1.5 Mg/81ml Syrp (Hydrocodone-homatropine) .Marland Kitchen... 1 tsp by mouth every 4-6h as needed for cough  Orders: T-2 View CXR (71020TC) >> Clear, NAD... TLB-BMP (Basic Metabolic Panel-BMET) (80048-METABOL) >> labs reviewed & essent WNL... TLB-Hepatic/Liver Function Pnl (80076-HEPATIC) TLB-CBC Platelet - w/Differential (85025-CBCD) TLB-TSH (Thyroid Stimulating Hormone) (84443-TSH) Depo- Medrol 80mg  (J1040) Admin of Therapeutic Inj  intramuscular or subcutaneous ([address])  Problem # 2:  HYPERTENSION (ICD-401.9) She is weak & BP trending low>  rec to HOLD her Diovan for now;  check BP at home freq & restart the Diovan when BP returns to the 120-130 range... Her updated medication list for this problem includes:    Diovan 80 Mg Tabs (Valsartan) .Marland Kitchen... Take 1 tablet by mouth once a day  Problem # 3:  MITRAL VALVE PROLAPSE, HX OF (ICD-V12.50) She actually had a neg 2DEcho from DrKelly>  followed for palpit etc & stable...  Problem # 4:  HYPERCHOLESTEROLEMIA,  BORDERLINE (ICD-272.4) Stable on diet alone...  Problem # 5:  PSYCHIATRIC DISORDER (ICD-300.9) Aware>  followed by DrPlovsky...  Problem # 6:  OTHER MEDICAL ISSUES AS NOTED>>>  Complete Medication List: 1)  Aspirin 81 Mg Tbec (Aspirin) .... Take 1 tab by mouth once daily.Marland KitchenMarland Kitchen 2)  Diovan 80 Mg Tabs (Valsartan) .... Take 1 tablet by mouth once a day 3)  Calcium 600/vitamin D 600-400 Mg-unit Tabs (Calcium carbonate-vitamin d) .... Take 1 tab by mouth two times a day... 4)  Lorazepam 1 Mg Tabs (Lorazepam) .... Take as directed by drplovsky.Marland KitchenMarland Kitchen 5)  Zithromax Z-pak 250 Mg Tabs (Azithromycin) .... Take as directed... 6)  Prednisone 10 Mg Tabs (Prednisone) .... Take 1 tab by mouth two times a day x3d, then 1 tab daily x3d, then 1/2 tab daily til gone... 7)  Hydromet 5-1.5 Mg/71ml Syrp (Hydrocodone-homatropine) .Marland Kitchen.. 1 tsp by mouth every 4-6h as needed for cough  Patient Instructions: 1)  Today we updated your med list- see below.... 2)  For your Bronchitis infection:  Take the ZPAK & PREDNISONE taper as directed...  be sure to add the OTC GUAIFENESIN 400mg  tabs> 2 tabs twice daily w/ plenty of fluids...  we also wrote a perscription for HYDROMET cough syrup you can use for cough & the chest wall pain.Marland KitchenMarland Kitchen 3)  Rest at home & drink plenty of fluids.Marland KitchenMarland Kitchen 4)  HOLD your Diovan until your BP is back >120 systolic (this will help your weakness).Marland KitchenMarland Kitchen 5)  Today we did a follow up CXR & non-fasting blood work... please call the "phone tree" in a few days for your lab results.Marland KitchenMarland Kitchen 6)  Call for any questions.Marland KitchenMarland Kitchen 7)  Please schedule a follow-up appointment in 6 months, sooner as needed. Prescriptions: HYDROMET 5-1.5 MG/5ML SYRP (HYDROCODONE-HOMATROPINE) 1 tsp by mouth every 4-6H as needed for cough  #4 oz x 2   Entered and Authorized by:   Michele Mcalpine MD   Signed by:   Michele Mcalpine MD on 04/07/2010   Method used:   Print then Give to Patient   RxID:   [id number] PREDNISONE 10 MG TABS (PREDNISONE) take 1 tab by  mouth two times a day x3d, then 1 tab daily x3d, then 1/2 tab daily til gone...  #12 x 0   Entered and Authorized by:   Michele Mcalpine MD   Signed by:   Michele Mcalpine MD on 04/07/2010   Method used:   Print then Give to Patient   RxID:   [id number] ZITHROMAX Z-PAK 250 MG TABS (AZITHROMYCIN) take as directed...  #1 pack x 2   Entered and Authorized by:   Michele Mcalpine MD   Signed by:   Michele Mcalpine MD on 04/07/2010   Method used:   Print then Give to Patient   RxID:   [id number]      Medication Administration  Injection # 1:    Medication: Depo- Medrol 80mg     Diagnosis: ACUTE BRONCHITIS (ICD-466.0)    Route: IM    Site: RUOQ gluteus    Exp Date: 08/2012    Lot #: [id number]    Mfr: Pharmacia    Patient tolerated injection without complications    Given by: Randell Loop CMA (April 07, 2010 3:47 PM)  Orders Added: 1)  Est. Patient Level IV [01027] 2)  T-2 View CXR [71020TC] 3)  TLB-BMP (Basic Metabolic Panel-BMET) [80048-METABOL] 4)  TLB-Hepatic/Liver Function Pnl [80076-HEPATIC] 5)  TLB-CBC Platelet - w/Differential [85025-CBCD] 6)  TLB-TSH (Thyroid Stimulating Hormone) [84443-TSH] 7)  Depo- Medrol 80mg  [J1040] 8)  Admin of Therapeutic Inj  intramuscular or subcutaneous [25366]

## 2010-04-17 NOTE — Progress Notes (Signed)
Summary: appt  Phone Note Call from Patient Call back at Home Phone [phone]   Caller: Patient Call For: Chloe Perkins Summary of Call: Pt c/o chest congestion, sob x 3-4 days temp x 2 days wants to be seen today offered her appt with TP she refused pls advise.//[facility]  Pt called again inquiring about appt with SN.[facility]  April 07, 2010 9:23 AM  Initial call taken by: Darletta Moll,  April 07, 2010 8:07 AM  Follow-up for Phone Call        Called, spoke with pt.  She c/o SOB, nonprod cough x 5-6 days.  Fever of 100.  Requesting to see SN -  OV scheduled for today at 2:30 -- pt aware. Follow-up by: Gweneth Dimitri RN,  April 07, 2010 9:35 AM

## 2010-06-24 NOTE — Op Note (Signed)
NAMESTARLYNN, KLINKNER             ACCOUNT NO.:  [id number]   MEDICAL RECORD NO.:  [id number]          PATIENT TYPE:  AMB   LOCATION:  DSC                          FACILITY:  [facility]   PHYSICIAN:  Tennis Must Meyerdierks, M.D.DATE OF BIRTH:  Jan 09, 1946   DATE OF PROCEDURE:  11/30/2007  DATE OF DISCHARGE:                               OPERATIVE REPORT   PREOPERATIVE DIAGNOSIS:  Ruptured extensor pollicis longus tendon, left  thumb.   POSTOPERATIVE DIAGNOSIS:  Ruptured extensor pollicis longus tendon, left  thumb.   PROCEDURE:  Extensor indicis proprius tendon transfer to extensor  pollicis longus tendon, left thumb.   SURGEON:  Lowell Bouton, MD   ANESTHESIA:  General.   OPERATIVE FINDINGS:  The patient had a degenerative rupture of her EPL  tendon back at the wrist level.  The tendon appeared healthy distal to  the area of rupture.  She had an extensor indicis proprius tendon and a  functioning extensor digitorum communis tendon to the index finger.   PROCEDURE:  Under general anesthesia with a tourniquet on the left arm,  the left hand was prepped and draped in the usual fashion.  After  exsanguinating the limb, the tourniquet was inflated to 250 mmHg.  A  longitudinal incision was made over the dorsum of the first metacarpal  and carried down through the subcutaneous tissues.  Blunt dissection was  carried down to the extensor tendons and there were two slips of the EPL  tendon.  These were traced proximally and freed of any tenosynovitis.  A  transverse incision was then made over the just distal to the extensor  retinaculum and carried down through the subcutaneous tissues.  The  wrist extensors were identified and then the stump of the EPL tendon was  identified.  It was found to be braided over a distance of about 2 cm.  Hemostat was placed on the end of the tendon and it was pulled back into  the wound and the thumb IP joint fully extended.  There was good  glide.  A transverse incision was then made over just proximal to the MP of the  index finger and blunt dissection was carried down to the extensor  mechanism.  The ulnar tendon which is the extensor indicis proprius was  then transected just proximal to the extensor retinaculum.  It was freed  up proximally with a blunt dissection and then brought out through the  transverse incision in the wrist.  It was felt that the EPL stump was  long enough to perform the repair at the [facility] level.  The EIP tendon was  then braided with a Pulvertaft into the EPL tendon.  Care was taken to  remove the stump that was damage.  Three times the tendon braider was  used and a 3-0 Ethibond suture was used to secure the braided tendon.  Instruments were removed and by tenodesis, there was good tension on the  EPL tendon transfer.  The EIP was placed in with the wrist the extended  to 45 degrees and the IP of the thumb fully extended.  It would  flex  with further extension.  After braiding the tendon in 3 times, it was  brought back on itself and secured with 3 more sutures of 3-0 Ethibond.  Excess EIP tendon was then excised as was the braided stump of the EPL.  The wounds were then irrigated copiously with saline.  Marcaine 0.5% was  placed in the skin edges for pain control.  The wounds were closed with  a 3-0 subcuticular Prolene.  Steri-Strips were applied  followed by sterile dressings and a thumb spica splint with the thumb  and wrist extended.  The tourniquet was released with good circulation  of the hand.  The patient went to the recovery room awake in stable in  good condition.      Lowell Bouton, M.D.  Electronically Signed     EMM/MEDQ  D:  11/30/2007  T:  12/01/2007  Job:  [id number]

## 2010-08-12 ENCOUNTER — Other Ambulatory Visit: Payer: Self-pay | Admitting: *Deleted

## 2010-08-12 MED ORDER — VALSARTAN 80 MG PO TABS: 80.0000 mg | ORAL_TABLET | Freq: Every day | ORAL | Status: DC

## 2010-08-27 ENCOUNTER — Ambulatory Visit (INDEPENDENT_AMBULATORY_CARE_PROVIDER_SITE_OTHER): Payer: Medicare Other | Admitting: Adult Health

## 2010-08-27 ENCOUNTER — Telehealth: Payer: Self-pay | Admitting: Pulmonary Disease

## 2010-08-27 ENCOUNTER — Encounter: Payer: Self-pay | Admitting: Adult Health

## 2010-08-27 ENCOUNTER — Encounter: Payer: Self-pay | Admitting: Internal Medicine

## 2010-08-27 ENCOUNTER — Other Ambulatory Visit (INDEPENDENT_AMBULATORY_CARE_PROVIDER_SITE_OTHER): Payer: Medicare Other

## 2010-08-27 VITALS — BP 132/70 | HR 60 | Temp 97.9°F | Ht 63.0 in | Wt 119.0 lb

## 2010-08-27 DIAGNOSIS — K529 Noninfective gastroenteritis and colitis, unspecified: Secondary | ICD-10-CM | POA: Insufficient documentation

## 2010-08-27 DIAGNOSIS — R197 Diarrhea, unspecified: Secondary | ICD-10-CM

## 2010-08-27 LAB — AMYLASE: Amylase: 116 U/L (ref 27–131)

## 2010-08-27 LAB — BASIC METABOLIC PANEL
BUN: 11 mg/dL (ref 6–23)
CO2: 24 mEq/L (ref 19–32)
Chloride: 108 mEq/L (ref 96–112)
Creatinine, Ser: 1.1 mg/dL (ref 0.4–1.2)

## 2010-08-27 LAB — HEPATIC FUNCTION PANEL
ALT: 17 U/L (ref 0–35)
Albumin: 4.7 g/dL (ref 3.5–5.2)
Bilirubin, Direct: 0.1 mg/dL (ref 0.0–0.3)
Total Protein: 8.2 g/dL (ref 6.0–8.3)

## 2010-08-27 LAB — LIPASE: Lipase: 38 U/L (ref 11.0–59.0)

## 2010-08-27 LAB — CBC WITH DIFFERENTIAL/PLATELET
Basophils Absolute: 0 10*3/uL (ref 0.0–0.1)
Eosinophils Absolute: 0 10*3/uL (ref 0.0–0.7)
Eosinophils Relative: 0.3 % (ref 0.0–5.0)
HCT: 41.6 % (ref 36.0–46.0)
Hemoglobin: 13.9 g/dL (ref 12.0–15.0)
Lymphocytes Relative: 21.5 % (ref 12.0–46.0)
MCHC: 33.5 g/dL (ref 30.0–36.0)
MCV: 95.9 fl (ref 78.0–100.0)
Neutrophils Relative %: 71.2 % (ref 43.0–77.0)
Platelets: 213 10*3/uL (ref 150.0–400.0)
RDW: 12.7 % (ref 11.5–14.6)

## 2010-08-27 LAB — SEDIMENTATION RATE: Sed Rate: 17 mm/hr (ref 0–22)

## 2010-08-27 NOTE — Telephone Encounter (Signed)
Per JJ, TP will see pt but they need to come on now.  Called, spoke with Sammy.  He is aware TP will see pt but they need to go ahead and leave house and come straight here.  He verbalized understanding of this and stated they would be here shortly.

## 2010-08-27 NOTE — Telephone Encounter (Signed)
Added pt on to TP schedule for today at 10.  Thanks   If you could let the pt know.

## 2010-08-27 NOTE — Telephone Encounter (Signed)
Called, spoke with Chloe Perkins.  He was informed TP could see pt today at 10 am.  However, he states they are unable to make it at 10 am bc it will take them 15 min to get ready and 45 min to get here.  Advised would have to call him back to see if she can be worked in later.  Spoke with Chloe Perkins regarding this.  Will forward message to her -- pls advise. Thanks!

## 2010-08-27 NOTE — Progress Notes (Signed)
  Subjective:    Patient ID: Chloe Perkins, female    DOB: [birth year], 65 y.o.   MRN: [id number]  HPI Acute Ov Complains of loose stools on/off for last 6 months. Was seen  At  [facility] 7.15.12 for staff infection in fingernail  and was given bactrim. Had increased   diarrhea, weakness, "felt out of it", decreased energy/appetite, weight loss x7weeks and  went back to UC on 7.17.12 and was given fluids, labs and ekg. Records not available at present.  Ov average she goes 4 times -loose stools in am.  No relation to food. No bloody stools.No travel.  No vomitting. Appetite is better since IV fluids but not great.    Review of Systems Constitutional:   No  weight loss, night sweats,  Fevers, chills, fatigue, or  lassitude.  HEENT:   No headaches,  Difficulty swallowing,  Tooth/dental problems, or  Sore throat,                No sneezing, itching, ear ache, nasal congestion, post nasal drip,   CV:  No chest pain,  Orthopnea, PND, swelling in lower extremities, anasarca, dizziness, palpitations, syncope.   GI  No heartburn, indigestion, abdominal pain,   bloody stools.   Resp: No shortness of breath with exertion or at rest.  No excess mucus, no productive cough,  No non-productive cough,  No coughing up of blood.  No change in color of mucus.  No wheezing.  No chest wall deformity  Skin: no rash or lesions.  GU: no dysuria, change in color of urine, no urgency or frequency.  No flank pain, no hematuria   MS:  No joint pain or swelling.  No decreased range of motion.  No back pain.  Psych:  No change in mood or affect. No depression or anxiety.  No memory loss.         Objective:   Physical Exam GEN: A/Ox3; pleasant , NAD, well nourished   HEENT:  [place]/AT,  EACs-clear, TMs-wnl, NOSE-clear, THROAT-clear, no lesions, no postnasal drip or exudate noted.   NECK:  Supple w/ fair ROM; no JVD; normal carotid impulses w/o bruits; no thyromegaly or nodules palpated; no lymphadenopathy.  RESP   Clear  P & A; w/o, wheezes/ rales/ or rhonchi.no accessory muscle use, no dullness to percussion  CARD:  RRR, no m/r/g  , no peripheral edema, pulses intact, no cyanosis or clubbing.  GI:   Soft & nt; nml bowel sounds; no organomegaly or masses detected. No guarding or rebound   Musco: Warm bil, no deformities or joint swelling noted.   Neuro: alert, no focal deficits noted.    Skin: Warm, no lesions or rashes          Assessment & Plan:

## 2010-08-27 NOTE — Patient Instructions (Signed)
Advance bland diet as tolerated -begin with soups , broths, toast-avoid greasy fatty foods.  Push fluids , gatorades-mix with water , avoid sugary drinks.  Avoid lactose for 5 days  May eat yogurt daily  Begin Acidophlyllis otc daily  May take Immodium x 1 As needed  For diarrhea.  We are referring you to stomach specialist.  Please contact office for sooner follow up if symptoms do not improve or worsen or seek emergency care  I will call with lab results  Begin Prilosec 20mg  daily before meals

## 2010-08-27 NOTE — Telephone Encounter (Signed)
Called, spoke with pt's husband, Sammy.  States pt was seen at Urgent Care in [place] yesterday - was told pt is dehydrated and is "not circulating fluids properly."  States pt was given IVF and was told to see PCP this am.  He states pt is "sick" and "can't wait" - states pt needs to be seen today.  Having diarrhea, some nausea, weak, and no appetite.  Also, has lost 5 lbs since Sunday.    Dr. Lindie Spruce, pls advise if pt can be worked in.  Thanks!

## 2010-08-28 ENCOUNTER — Other Ambulatory Visit: Payer: Medicare Other

## 2010-08-28 DIAGNOSIS — K529 Noninfective gastroenteritis and colitis, unspecified: Secondary | ICD-10-CM

## 2010-09-02 ENCOUNTER — Ambulatory Visit (INDEPENDENT_AMBULATORY_CARE_PROVIDER_SITE_OTHER): Payer: Medicare Other | Admitting: Internal Medicine

## 2010-09-02 ENCOUNTER — Encounter: Payer: Self-pay | Admitting: Internal Medicine

## 2010-09-02 VITALS — BP 88/46 | HR 84 | Ht 63.0 in | Wt 117.0 lb

## 2010-09-02 DIAGNOSIS — K529 Noninfective gastroenteritis and colitis, unspecified: Secondary | ICD-10-CM

## 2010-09-02 DIAGNOSIS — F341 Dysthymic disorder: Secondary | ICD-10-CM

## 2010-09-02 DIAGNOSIS — F32A Depression, unspecified: Secondary | ICD-10-CM

## 2010-09-02 DIAGNOSIS — F329 Major depressive disorder, single episode, unspecified: Secondary | ICD-10-CM

## 2010-09-02 DIAGNOSIS — F411 Generalized anxiety disorder: Secondary | ICD-10-CM

## 2010-09-02 DIAGNOSIS — R197 Diarrhea, unspecified: Secondary | ICD-10-CM

## 2010-09-02 DIAGNOSIS — F419 Anxiety disorder, unspecified: Secondary | ICD-10-CM

## 2010-09-02 DIAGNOSIS — K589 Irritable bowel syndrome without diarrhea: Secondary | ICD-10-CM

## 2010-09-02 DIAGNOSIS — IMO0002 Reserved for concepts with insufficient information to code with codable children: Secondary | ICD-10-CM

## 2010-09-02 DIAGNOSIS — R031 Nonspecific low blood-pressure reading: Secondary | ICD-10-CM

## 2010-09-02 MED ORDER — ALIGN 4 MG PO CAPS: 1.0000 | ORAL_CAPSULE | Freq: Every day | ORAL | Status: DC

## 2010-09-02 NOTE — Patient Instructions (Signed)
Do not take your valsartan today. Take your blood pressure in the morning and call in to Dr. Jodelle Green office with the number and ask them for advice about when to restart that and followup.  I think your gastrointestinal problem is likely irritable bowel syndrome. I don't think he had a workup for that but were going to start  Align, replacing the acidophilus. Take the probiotic until the samples are finished. If you do not feel better significantly with respect to bowel movements over the next couple of weeks call us back. Lower call back if things worsen. Continue minimizing fiber in your diet like to do. Using caffeine and coffee intake as well as sugar free foods may help with the bowel movement issues as well, if you use these foods or candies.  If your finger fails to improve, but Dr. Jodelle Green office now also.

## 2010-09-02 NOTE — Progress Notes (Signed)
  Subjective:    Patient ID: Chloe Perkins, female    DOB: [birth year], 65 y.o.   MRN: [id number]  HPI 65 yo ww here with change in bowels. Several years of multiple formed bowel movements without other GI symptoms for years. Main issue now is weakness and feelking dehydrated and overall poorly. Anorexia is a problem since starting. One week ago she started Septra DS for thumb problem - staph infection. Prior to that se was feeling drained and week but it has intensified since then. BP has been < 110 systolic in the AM lateley and 6o diastolic. Has been taking valsartan.  Past 4 months somewhat worse overall. Some family deaths, increased anxiety lately. Free-floating. Did start acidophilous - feels a little better.    Review of Systems Positive for anxiety lately with situational stressors as mentioned in the history of present illness and some mild depressive symptoms. All other systems negative is about    Objective:   Physical Exam Well-developed well-nourished white woman no acute distress Eyes are anicteric pupils round and react to light Mouth posterior pharynx are free of lesions the mucous membranes appear relatively The neck is supple without thyromegaly or mass there is no supraclavicular or cervical adenopathy The lungs are clear Heart shows S1-S2 without rubs murmurs or gallops The abdomen is soft nontender without organomegaly or mass The lower extremities are free of edema The right thumb shows edema and mild puffiness without discharge at the base of the nail and cuticle region extending to the lateral aspect of the nail also. It is nontender. She says this is improved. There is no discharge. Affect appears slightly flat she is awake alert and oriented otherwise.       Assessment & Plan:

## 2010-09-03 ENCOUNTER — Telehealth: Payer: Self-pay | Admitting: Pulmonary Disease

## 2010-09-03 ENCOUNTER — Encounter: Payer: Self-pay | Admitting: Internal Medicine

## 2010-09-03 DIAGNOSIS — F329 Major depressive disorder, single episode, unspecified: Secondary | ICD-10-CM | POA: Insufficient documentation

## 2010-09-03 DIAGNOSIS — K589 Irritable bowel syndrome without diarrhea: Secondary | ICD-10-CM | POA: Insufficient documentation

## 2010-09-03 DIAGNOSIS — F419 Anxiety disorder, unspecified: Secondary | ICD-10-CM | POA: Insufficient documentation

## 2010-09-03 NOTE — Telephone Encounter (Signed)
Per SN---stay on the diovan 1/2 tab daily  And check BP daily until rov with SN in 1-2 weeks.  thanks

## 2010-09-03 NOTE — Telephone Encounter (Signed)
Pt returning call can be reached at [phone].Chloe Perkins

## 2010-09-03 NOTE — Telephone Encounter (Signed)
ATC pt back.  NA and no option to leave message.  WCB

## 2010-09-03 NOTE — Telephone Encounter (Signed)
Returning call.

## 2010-09-03 NOTE — Telephone Encounter (Signed)
Spoke with pt and notified of recs per SN. Pt verbalized understanding and ov with SN sched for 09/10/10 at 3 pm.

## 2010-09-03 NOTE — Assessment & Plan Note (Signed)
I think related to anorexia from sulfa antibiotic and decreased intake. Think diarrhea is actually IBS and not that significantre: volume loss. Told to hold BP meds, f/u PCP and force fluids.

## 2010-09-03 NOTE — Assessment & Plan Note (Signed)
Right thumb - improving on antibiotics but about to complete course and still with skin changes Told to follow-up PCP or hand surgeon if does not resolve

## 2010-09-03 NOTE — Assessment & Plan Note (Signed)
Overall pattern suggestive of this. Trial of align and low-fiber diet adherence. Consider other testing pending this. ? Celiac testing if still a problem

## 2010-09-03 NOTE — Assessment & Plan Note (Addendum)
?  etiology , May have acute gastroenteritis superimposed on IBS  Stool cx sent  With c diff  Will refer to GI for evaluation for chronic symptoms   Plan:  Advance bland diet as tolerated -begin with soups , broths, toast-avoid greasy fatty foods.  Push fluids , gatorades-mix with water , avoid sugary drinks.  Avoid lactose for 5 days  May eat yogurt daily  Begin Acidophlyllis otc daily  May take Immodium x 1 As needed  For diarrhea.  We are referring you to stomach specialist.  Please contact office for sooner follow up if symptoms do not improve or worsen or seek emergency care  I will call with lab results  Begin Prilosec 20mg  daily before meals

## 2010-09-03 NOTE — Telephone Encounter (Signed)
Pt says her BP was low when seen Dr. Leone Payor yesterday and he told her to stop her Diovan. Pt has checked her BP this morning and it is reading 119/66. She said the only new med for her is Bactrim and she finished this yesterday. She does c/o having dizziness at different times and cannot pinpoint what triggers this. Pls advise. Allergies  Allergen Reactions  . Alendronate Sodium     REACTION: pt states INTOL to Fosamax  . Avapro (Irbesartan)   . Verapamil     REACTION: Intol w/ bradycardia

## 2010-09-03 NOTE — Assessment & Plan Note (Addendum)
I think this is IBS. Trial of align and reduced fiber diet. May have some recent increase due to antibiotics - but do not think C diff

## 2010-09-03 NOTE — Telephone Encounter (Signed)
Per SN---restart the diovan 1/2 tablet by mouth every day and record BP readings daily.  thanks

## 2010-09-03 NOTE — Telephone Encounter (Signed)
LMOMTCB x 1 

## 2010-09-03 NOTE — Telephone Encounter (Signed)
Called and spoke with pt.  Informed her of SN's recs.  Pt verbalized understanding but still had questions.  Wanted to know how long she is to take 1/2 tab of Diovan daily and record her bp readings. Also wanted to know how many times a day she should take her bp.  Please advise.  Thank you.

## 2010-09-10 ENCOUNTER — Ambulatory Visit (INDEPENDENT_AMBULATORY_CARE_PROVIDER_SITE_OTHER): Payer: Medicare Other | Admitting: Pulmonary Disease

## 2010-09-10 ENCOUNTER — Encounter: Payer: Self-pay | Admitting: Pulmonary Disease

## 2010-09-10 DIAGNOSIS — M810 Age-related osteoporosis without current pathological fracture: Secondary | ICD-10-CM

## 2010-09-10 DIAGNOSIS — Z85820 Personal history of malignant melanoma of skin: Secondary | ICD-10-CM

## 2010-09-10 DIAGNOSIS — E785 Hyperlipidemia, unspecified: Secondary | ICD-10-CM

## 2010-09-10 DIAGNOSIS — K529 Noninfective gastroenteritis and colitis, unspecified: Secondary | ICD-10-CM

## 2010-09-10 DIAGNOSIS — K589 Irritable bowel syndrome without diarrhea: Secondary | ICD-10-CM

## 2010-09-10 DIAGNOSIS — F489 Nonpsychotic mental disorder, unspecified: Secondary | ICD-10-CM

## 2010-09-10 DIAGNOSIS — I10 Essential (primary) hypertension: Secondary | ICD-10-CM

## 2010-09-10 DIAGNOSIS — Z8679 Personal history of other diseases of the circulatory system: Secondary | ICD-10-CM

## 2010-09-10 DIAGNOSIS — R197 Diarrhea, unspecified: Secondary | ICD-10-CM

## 2010-09-14 ENCOUNTER — Encounter: Payer: Self-pay | Admitting: Pulmonary Disease

## 2010-09-14 NOTE — Progress Notes (Signed)
Subjective:    Patient ID: Chloe Perkins, female    DOB: 10/17/1945, 65 y.o.   MRN: [id number]  HPI [name] here for a follow up visit...     She has hx of  recurrent bronchitic infections, HBP, ?MVP, Borderline cholesterol, Osteoporosis, Hx of melanoma, and psyche disorder followed by DrPlovsky (on disability for this).    She is also followed by [facility] for Cards- HBP, ?MVP, & palpit w/ prev Holter monitor that she reports as neg, but notes that she was allergic to the pads w/ blisters ("from both types").  ~  July 10, 2009:  she saw TP 32mo ago w/ URI- cough, gray mucous, drainage, wheezing, SOB> treated w/ Omnicef, Pred, Zyrtek, Hydromet (never filled this) etc... Improved & almost back to baseline then recurrent symptoms> cough, clear thick phlegm, congestion, no hemoptysis, no CP, no palpit, etc... she is very distraught over these symptoms & we discussed further eval w/ CXR (clear, NAD); and blood work (all essent WNL)... we discussed Rx w/ Depo80, Pred 20mg - 3d taper, Guaifenesin 400mg - 1-2Bid (she was intol to MucinexMax), + rest/ fluids/ Delsym/ etc >> all symptoms resolved.  ~  April 07, 2010:  52mo ROV & add-on for recurrent bronchitic symptoms> started w/ a "cold" c/o dry nonproductive cough, low grade temp to 100, SOB, chest discomfort across the costal margins from the coughing, & incr SOB "like I can't get a DB";  also notes weak & BP noted to be running low 100/64 today in office:  **we discussed further eval w/ CXR (clear, NAD); and Labs (all WNL);  Rx w/ Depo80, Pred taper, ZPak, Mucinex, Hydromet, rest, fluids, etc;  hold Diovan in light of low BP til home checks improve.    Pt saw [facility] for Cards f/u 1/12 & stable> hx palpit, norm Myoview 2005 but poor aerobic capacity noted, neg 2DEcho 10/10 w/o MVP seen (& norm LVF), subseq f/u Myoview 11/10 also reported normal...  ~  September 10, 2010:  73mo ROV & add-on to recheck BP> recent prob w/ BP that was low in DrGessner's office &  her Diovan was held; then called & we restarted 1/2 tab daily w/ home BP checks 120-140/ 62-72 range;  Recent labs reviewed> BUN & Creat were normal, not dehydrated...  We decided to keep the Diovan at 1/2 tab; she requests ROVs every three months...    She saw DrGessner 7/12 w/ diarrhea, weak, feeling "dehydrated"; had Staph infection of her thumb (paronychia) & given SeptraDS; increased anxiety w/ death in the family... He felt likely IBS & rec Align & fiber adjustment, CDiff was neg...   Current Problems:   Hx of BRONCHITIS, RECURRENT (ICD-491.9) - she has a hx of recurrent bronchitic infections... ex-smoker, quit in the 1990's... seen for repeat exac 5/11 & 6/11 as above... ~  CXR 12/09 showed mild hyperinflation, few chr markings, NAD & stable... ~  CXR 5/11 showed old left rib fxs, clear, NAD.Marland Kitchen. ~  CXR 2/12 showed clear lungs, sl pectus deformity, NAD...  HYPERTENSION (ICD-401.9) - on DIOVAN 80mg - now down to 1/2 tab w/ good control... BP = 120/78 today and similar at home, tolerating med well and denies visual changes, CP, syncope, edema, etc... she notes occas palpit & EKG shows PAC.  MITRAL VALVE PROLAPSE, HX OF (ICD-V12.50) - followed for Cardiology by [facility]... prev intol to Verelan w/ bradycardia... she has intermittent palpit... 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 ~  last 2DEcho 7/09 showed norm LV size & function, normal valves x mild-mod TR... ~  EKG 5/10 showed NSR, rsr' V1-2, NAD... ~  she saw [facility] 10/10 w/ hx palpit/ dizzy> event monitor showed sinus brady, PAC's & short run SVT; he was concerned about poss sinus node dysfunction & continues to follow her closely... ~  2DEcho 10/10 showed normal LV w/ EF>55%, norm RV, normal valves (no MVP), etc... ~  Myoview 10/10 showed no evid scar or ischemia, normal LV & EF=73%, no changes... ~  EKG 8/11 showed NSR, rare PAC, otherw WNL...  HYPERCHOLESTEROLEMIA, BORDERLINE  (ICD-272.4) - hx mild elevation of cholesterol w/ excellent HDL numbers- on diet alone... ~  FLP 3/08 showed TChol 215, TG 77, HDL 87, LDL 102 ~  FLP 7/09 by [facility] showed TChol 219, TG 65, HDL 85, LDL 97 ~  FLP 5/10 showed TChol 193, TG 41, HDL 74, LDL 110 ~  FLP 8/11 showed TChol 194, TG 42, HDL 76, LDL 110  HEMORRHOIDS (ICD-455.6) - prev GI eval by DrSam w/ colonoscopy 3/06 showing tortuous colon but essentially WNL...  OSTEOPOROSIS (ICD-733.00) - followed by DrMeisinger for GYN... she is intol to Fosamax and was on Miacalcin nasal spray but stopped this on her own last yr... she fell w/ several "cracked ribs" in 2007- eval & rx by DrDean for Ortho... she refuses Bisphosphonate therapy- "I can't take anything on an empty stomach- I get sick"... offered Reclast infusion but she inexplicably refuses this Rx as well... she does take calcium and MVI... ~  BMD here 5/06 showed TScores -1.18 in Spine, and -2.9 in left femoral neck. ~  labs 5/10 showed Vit D level = 48..,. rec to take OTC Vit D supplement. ~  BMD here 6/10 showed TScores -1.0 in Spine, and -2.6 in FemNecks> improved on Calcium, MVI, VitD...  PSYCHIATRIC DISORDER (ICD-300.9) - she is on disability from DrPlovsky- prev on Paxil & Alpraz, but now just on ATIVAN 0.5mg - taking 1/2 tab Bid.  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 she had Derm check by [facility] in 2005 w/ mult AK's frozen w/ liq nitrogen... she tells me that she is followed regularly by [facility] & everything has been OK.  Health Maintenance - last saw DrMeisinger for GYN in 2008- she is due now... last Mammogram at [facility] in 2008 and due now... she had colonoscopy in 2006 as above...    Past Surgical History  Procedure Date  . Endometrial ablation 1976  . Melanoma excision 1999    superficial spreading melanoma right post  shoulder  . Left thumb tendon repair 10/09  . Abdominal hysterectomy 1975  . Colonoscopy 04/15/2004    2006: Normal    Outpatient Encounter Prescriptions as of 09/10/2010  Medication Sig Dispense Refill  . aspirin 81 MG tablet Take 81 mg by mouth daily.        . calcium-vitamin D (OSCAL WITH D) 500-200 MG-UNIT per tablet Take 1 tablet by mouth 2 (two) times daily.        Marland Kitchen LORazepam (ATIVAN) 0.5 MG tablet 1/2 tab by mouth twice daily       . Probiotic Product (ALIGN) 4 MG CAPS Take 1 capsule by mouth daily.  28 capsule  0  . valsartan (DIOVAN) 80 MG tablet Take 1/2 tablet by mouth once daily       . DISCONTD:  valsartan (DIOVAN) 80 MG tablet Take 1 tablet (80 mg total) by mouth daily.  30 tablet  5  . DISCONTD: sulfamethoxazole-trimethoprim (BACTRIM DS) 800-160 MG per tablet Take 1 tablet by mouth 2 (two) times daily.         Allergies  Allergen Reactions  . Alendronate Sodium     REACTION: pt states INTOL to Fosamax  . Avapro (Irbesartan)   . Verapamil     REACTION: Intol w/ bradycardia    Current Medications, Allergies, Past Medical History, Past Surgical History, Family History, and Social History were reviewed in [employer] record.    Review of Systems      See HPI - all other systems neg except as noted...  The patient complains of dyspnea on exertion, poor appetite, and muscle weakness.  The patient denies fever, weight loss, weight gain, vision loss, decreased hearing, hoarseness, chest pain, syncope, peripheral edema, headaches, hemoptysis, abdominal pain, melena, hematochezia, severe indigestion/heartburn, hematuria, incontinence, suspicious skin lesions, transient blindness, difficulty walking, depression, unusual weight change, abnormal bleeding, enlarged lymph nodes, and angioedema.     Objective:   Physical Exam    WD, WN, [name] in NAD... she is chr ill appearing... GENERAL:  Alert & oriented; pleasant & cooperative... HEENT:  [place]/AT,  EOM-full, PERRLA, EACs-clear, TMs-wnl, NOSE- sl red, THROAT- sl red w/ dry MM's... NECK:  Supple w/ fairROM; no JVD; normal carotid impulses w/o bruits; no thyromegaly or nodules palpated; no lymphadenopathy. CHEST:  Clear to P & A; without wheezes or rales... few scat rhonchi noted... HEART:  Regular Rhythm; without murmurs/ rubs/ or gallops detected... ABDOMEN:  Soft & nontender; normal bowel sounds; no organomegaly or masses palpated... EXT: without deformities, left wrist splint, mild arthritic changes; no varicose veins/ venous insuffic/ or edema. NEURO:  no focal neuro deficits... DERM:  No lesions noted; no rash etc... scar right post shoulder from melanoma surg...   Assessment & Plan:   HBP>  BP controlled w/ the Diovan, now taking 1/2 of the 80mg  tab daily;  She will continue to monitor BP at home...  MVP>  Followed for Cards by [facility], hx palpit & dizzy- all test neg & she was reassured...  CHOL>  Sl incr LDL chol on diet alone...  GI>  IBS, Hems>  Now on Align & adjusted fiber content, followed by DrGessner- feeling sl better; CDiff neg etc...  Osteoporosis> on Calcium, MVI, VitD- she has refused all other meds/ Bisphosphonates, Reclast etc; followed by DrMeisinger for GYN...  Psyche>  States she is disabled due to her nerves & followed by DrPlovsky on Ativan 0.5mg  1/2 tab Bid at present.Marland KitchenMarland Kitchen

## 2010-09-14 NOTE — Patient Instructions (Signed)
Today we updated your med list in EPIC...  Continue your current meds the same & the Diovan at 1/2 tab daily...  Call for any questions...  Let's plan a follow up visit in 3 months.Marland KitchenMarland Kitchen

## 2010-10-16 HISTORY — PX: US ECHOCARDIOGRAPHY: HXRAD669

## 2010-11-11 ENCOUNTER — Telehealth: Payer: Self-pay | Admitting: Pulmonary Disease

## 2010-11-11 DIAGNOSIS — Z1231 Encounter for screening mammogram for malignant neoplasm of breast: Secondary | ICD-10-CM

## 2010-11-11 LAB — BASIC METABOLIC PANEL
BUN: 13
Calcium: 9.6
Chloride: 105
Creatinine, Ser: 0.81
GFR calc Af Amer: >60
GFR calc non Af Amer: >60

## 2010-11-11 LAB — POCT HEMOGLOBIN-HEMACUE: Hemoglobin: 14

## 2010-11-11 NOTE — Telephone Encounter (Signed)
I see where she is now due for mammo, last one done in 08 per SN's ov note. Order sent to [facility]. Pt aware.

## 2010-11-28 ENCOUNTER — Encounter: Payer: Self-pay | Admitting: Internal Medicine

## 2010-11-28 ENCOUNTER — Ambulatory Visit (INDEPENDENT_AMBULATORY_CARE_PROVIDER_SITE_OTHER): Payer: Medicare Other | Admitting: Internal Medicine

## 2010-11-28 DIAGNOSIS — R002 Palpitations: Secondary | ICD-10-CM

## 2010-11-28 DIAGNOSIS — I4949 Other premature depolarization: Secondary | ICD-10-CM

## 2010-11-28 DIAGNOSIS — I10 Essential (primary) hypertension: Secondary | ICD-10-CM

## 2010-11-28 MED ORDER — FLECAINIDE ACETATE 50 MG PO TABS: 50.0000 mg | ORAL_TABLET | Freq: Two times a day (BID) | ORAL | Status: DC

## 2010-11-28 NOTE — Patient Instructions (Signed)
Your physician has recommended you make the following change in your medication: 1)start Flecainide 50mg  one twice daily  Your physician has requested that you have an exercise tolerance test. For further information please visit [email or web address] Please also follow instruction sheet, as given.  2 weeks with Dr Ladona Ridgel

## 2010-11-28 NOTE — Assessment & Plan Note (Signed)
Her blood pressure is slightly elevated. I have asked her to maintain a low-sodium diet.

## 2010-11-28 NOTE — Progress Notes (Signed)
HPI Chloe Perkins is referred by Drs. Alanda Amass and Nicholaus Bloom for evaluation of palpitations.  The patient has had a h/o tachypalpitations and documented SVT. She has been on calcium channel blockers but despite this continues to have episodes of irregular heart beats for which she remains quite anxious. His never had syncope. She has worn cardiac markers in the past. These have historically demonstrated nonsustained SVT. In addition, she has had bradycardia on beta blockers. No syncope. Allergies  Allergen Reactions  . Alendronate Sodium     REACTION: pt states INTOL to Fosamax  . Avapro (Irbesartan)   . Verapamil     REACTION: Intol w/ bradycardia     Current Outpatient Prescriptions  Medication Sig Dispense Refill  . buPROPion (WELLBUTRIN SR) 150 MG 12 hr tablet Take 150 mg by mouth 2 (two) times daily.        . calcium-vitamin D (OSCAL WITH D) 500-200 MG-UNIT per tablet Take 1 tablet by mouth 2 (two) times daily.        Marland Kitchen diltiazem (CARDIZEM CD) 120 MG 24 hr capsule Take 120 mg by mouth daily.        Marland Kitchen LORazepam (ATIVAN) 0.5 MG tablet 1/2 tab by mouth twice daily       . Probiotic Product (ALIGN) 4 MG CAPS Take 1 capsule by mouth daily.  28 capsule  0  . valsartan (DIOVAN) 80 MG tablet 80 mg.       . flecainide (TAMBOCOR) 50 MG tablet Take 1 tablet (50 mg total) by mouth 2 (two) times daily.  60 tablet  3     Past Medical History  Diagnosis Date  . Hypertension   . Mitral valve prolapse   . HLD (hyperlipidemia)   . Hemorrhoids   . Osteoporosis   . Personal history of malignant melanoma of skin   . Viral gastroenteritis   . Anxiety and depression   . History of melanoma   . Asthmatic bronchitis     ROS:   All systems reviewed and negative except as noted in the HPI.   Past Surgical History  Procedure Date  . Endometrial ablation 1976  . Melanoma excision 1999    superficial spreading melanoma right post shoulder  . Left thumb tendon repair 10/09  . Abdominal  hysterectomy 1975  . Colonoscopy 04/15/2004    2006: Normal     Family History  Problem Relation Age of Onset  . Emphysema Father     PGF, brother, sister  . COPD Father     PGF, brother, sister  . Rheum arthritis Mother   . Lung cancer Paternal Grandfather   . Stroke Mother   . Heart disease Mother     Father, brother, sister  . Ovarian cancer Paternal Aunt   . Breast cancer Cousin      History   Social History  . Marital Status: Married    Spouse Name: N/A    Number of Children: 1  . Years of Education: N/A   Occupational History  . Retired    Social History Main Topics  . Smoking status: Former Smoker -- 0.5 packs/day for 20 years    Types: Cigarettes    Quit date: 02/09/1990  . Smokeless tobacco: Not on file  . Alcohol Use: No  . Drug Use: No  . Sexually Active: Not on file   Other Topics Concern  . Not on file   Social History Narrative  . No narrative on file     There  were no vitals taken for this visit.  Physical Exam:  Well appearing middle-aged woman,NAD HEENT: Unremarkable Neck:  No JVD, no thyromegally Lymphatics:  No adenopathy Back:  No CVA tenderness Lungs:  Clear with no wheezes, rales, or rhonchi. HEART:  Regular rate rhythm, no murmurs, no rubs, no clicks Abd:  soft, positive bowel sounds, no organomegally, no rebound, no guarding Ext:  2 plus pulses, no edema, no cyanosis, no clubbing Skin:  No rashes no nodules Neuro:  CN II through XII intact, motor grossly intact  EKG Normal sinus rhythm with a short PR interval and no ventricular preexcitation  Assess/Plan:

## 2010-11-28 NOTE — Assessment & Plan Note (Signed)
The etiology of her symptoms is not totally clear. 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 She notes that her symptoms worsen with exertion. Once he has begun flecanide, will have her undergo regular exercise treadmill testing. She will continue her calcium channel blocker for now.

## 2010-12-01 ENCOUNTER — Telehealth: Payer: Self-pay | Admitting: Cardiology

## 2010-12-01 NOTE — Telephone Encounter (Signed)
Per pt call she canceled her treadmill test she is gonna try exercise and no medication

## 2010-12-02 NOTE — Telephone Encounter (Signed)
Dr Ladona Ridgel aware and will let Dr Kandis Cocking office know

## 2010-12-10 ENCOUNTER — Encounter: Payer: Medicare Other | Admitting: Internal Medicine

## 2010-12-11 ENCOUNTER — Institutional Professional Consult (permissible substitution): Payer: Medicare Other | Admitting: Internal Medicine

## 2010-12-25 ENCOUNTER — Ambulatory Visit: Payer: Self-pay | Admitting: Pulmonary Disease

## 2011-01-05 ENCOUNTER — Encounter: Payer: Self-pay | Admitting: Pulmonary Disease

## 2011-01-27 DIAGNOSIS — D229 Melanocytic nevi, unspecified: Secondary | ICD-10-CM

## 2011-01-27 HISTORY — DX: Melanocytic nevi, unspecified: D22.9

## 2011-02-18 ENCOUNTER — Encounter: Payer: Self-pay | Admitting: Internal Medicine

## 2011-03-23 ENCOUNTER — Telehealth: Payer: Self-pay | Admitting: Pulmonary Disease

## 2011-03-23 MED ORDER — VALSARTAN 80 MG PO TABS: ORAL_TABLET | ORAL | Status: DC

## 2011-03-23 NOTE — Telephone Encounter (Signed)
I spoke with pt and is requesting a diovan refill sent to [facility] in [place]. I have sent rx and nothing further was needed

## 2011-05-22 ENCOUNTER — Other Ambulatory Visit: Payer: Self-pay | Admitting: Cardiovascular Disease

## 2011-05-22 LAB — CBC WITH DIFFERENTIAL/PLATELET
Basophil %: 1.2 %
Eosinophil #: 0 10*3/uL (ref 0.0–0.7)
Eosinophil %: 1.1 %
HGB: 13.8 g/dL (ref 12.0–16.0)
Lymphocyte %: 26.9 %
MCV: 95 fL (ref 80–100)
Monocyte #: 0.3 x10 3/mm (ref 0.2–0.9)
Neutrophil %: 60.7 %
Platelet: 157 10*3/uL (ref 150–440)
WBC: 3.4 10*3/uL — ABNORMAL LOW (ref 3.6–11.0)

## 2011-05-22 LAB — COMPREHENSIVE METABOLIC PANEL
Anion Gap: 8 (ref 7–16)
Bilirubin,Total: 0.5 mg/dL (ref 0.2–1.0)
Calcium, Total: 9.1 mg/dL (ref 8.5–10.1)
Chloride: 105 mmol/L (ref 98–107)
Co2: 26 mmol/L (ref 21–32)
EGFR (African American): >60
EGFR (Non-African Amer.): >60
Osmolality: 279 (ref 275–301)
Potassium: 4.2 mmol/L (ref 3.5–5.1)
SGOT(AST): 19 U/L (ref 15–37)
Total Protein: 7.3 g/dL (ref 6.4–8.2)

## 2011-05-22 LAB — LIPID PANEL
Cholesterol: 203 mg/dL — ABNORMAL HIGH (ref 0–200)
HDL Cholesterol: 88 mg/dL — ABNORMAL HIGH (ref 40–60)
Triglycerides: 65 mg/dL (ref 0–200)
VLDL Cholesterol, Calc: 13 mg/dL (ref 5–40)

## 2011-05-22 LAB — TSH: Thyroid Stimulating Horm: 6.18 u[IU]/mL — ABNORMAL HIGH

## 2011-10-08 DIAGNOSIS — H43812 Vitreous degeneration, left eye: Secondary | ICD-10-CM | POA: Insufficient documentation

## 2011-10-08 DIAGNOSIS — H31099 Other chorioretinal scars, unspecified eye: Secondary | ICD-10-CM | POA: Insufficient documentation

## 2011-10-08 DIAGNOSIS — D3131 Benign neoplasm of right choroid: Secondary | ICD-10-CM | POA: Insufficient documentation

## 2011-11-06 ENCOUNTER — Telehealth: Payer: Self-pay | Admitting: Pulmonary Disease

## 2011-11-06 NOTE — Telephone Encounter (Signed)
Order has been printed and faxed to the pharmacy per pts request and ok per SN.. Called and lmom to make the pt aware.

## 2011-11-06 NOTE — Telephone Encounter (Signed)
Leigh asking SN about this.  

## 2011-12-02 ENCOUNTER — Telehealth: Payer: Self-pay | Admitting: Pulmonary Disease

## 2011-12-02 DIAGNOSIS — N6459 Other signs and symptoms in breast: Secondary | ICD-10-CM

## 2011-12-02 DIAGNOSIS — Z1231 Encounter for screening mammogram for malignant neoplasm of breast: Secondary | ICD-10-CM

## 2011-12-02 DIAGNOSIS — M810 Age-related osteoporosis without current pathological fracture: Secondary | ICD-10-CM

## 2011-12-02 NOTE — Telephone Encounter (Signed)
Per SN---ok to order both for the pt .  thanks

## 2011-12-02 NOTE — Telephone Encounter (Signed)
Orders for both have been placed per the pts request and the pt is requesting that these both be done at [facility] regional.

## 2011-12-02 NOTE — Telephone Encounter (Signed)
Pt calling to have her mammogram and bone density scheduled both at [facility]. Please advise if okay to order SN thanks

## 2012-01-04 ENCOUNTER — Encounter: Payer: Self-pay | Admitting: Adult Health

## 2012-01-04 ENCOUNTER — Ambulatory Visit (INDEPENDENT_AMBULATORY_CARE_PROVIDER_SITE_OTHER): Payer: Medicare Other | Admitting: Adult Health

## 2012-01-04 ENCOUNTER — Ambulatory Visit: Payer: Medicare Other | Admitting: Adult Health

## 2012-01-04 VITALS — BP 114/60 | HR 84 | Temp 100.3°F | Ht 63.0 in | Wt 119.0 lb

## 2012-01-04 DIAGNOSIS — J42 Unspecified chronic bronchitis: Secondary | ICD-10-CM

## 2012-01-04 MED ORDER — AZITHROMYCIN 250 MG PO TABS: ORAL_TABLET | ORAL | Status: AC

## 2012-01-04 MED ORDER — HYDROCODONE-HOMATROPINE 5-1.5 MG/5ML PO SYRP: 5.0000 mL | ORAL_SOLUTION | Freq: Four times a day (QID) | ORAL | Status: AC | PRN

## 2012-01-04 NOTE — Assessment & Plan Note (Signed)
Flare   Plan Z-Pak take as directed. Mucinex DM twice daily as needed. For cough and congestion. Fluids and rest. Tylenol as needed. For fever. Hydromet 1-2 teaspoons every 4-6 hours as needed. For cough, may make you sleepy. Please contact office for sooner follow up if symptoms do not improve or worsen or seek emergency care

## 2012-01-04 NOTE — Patient Instructions (Addendum)
Z-Pak take as directed. Mucinex DM twice daily as needed. For cough and congestion. Fluids and rest. Tylenol as needed. For fever. Hydromet 1-2 teaspoons every 4-6 hours as needed. For cough, may make you sleepy. Please contact office for sooner follow up if symptoms do not improve or worsen or seek emergency care  follow up with Dr. Kriste Basque  For physical

## 2012-01-04 NOTE — Progress Notes (Signed)
Subjective:    Patient ID: Chloe Perkins, female    DOB: 06/26/1945, 66 y.o.   MRN: [id number]  HPI  66 y/o WF       She has hx of  recurrent bronchitic infections, HBP, ?MVP, Borderline cholesterol, Osteoporosis, Hx of melanoma, and psyche disorder followed by DrPlovsky (on disability for this).    She is also followed by [facility] for Cards- HBP, ?MVP, & palpit w/ prev Holter monitor that she reports as neg, but notes that she was allergic to the pads w/ blisters ("from both types").  ~  July 10, 2009:  she saw TP 57mo ago w/ URI- cough, gray mucous, drainage, wheezing, SOB> treated w/ Omnicef, Pred, Zyrtek, Hydromet (never filled this) etc... Improved & almost back to baseline then recurrent symptoms> cough, clear thick phlegm, congestion, no hemoptysis, no CP, no palpit, etc... she is very distraught over these symptoms & we discussed further eval w/ CXR (clear, NAD); and blood work (all essent WNL)... we discussed Rx w/ Depo80, Pred 20mg - 3d taper, Guaifenesin 400mg - 1-2Bid (she was intol to MucinexMax), + rest/ fluids/ Delsym/ etc >> all symptoms resolved.  ~  April 07, 2010:  3mo ROV & add-on for recurrent bronchitic symptoms> started w/ a "cold" c/o dry nonproductive cough, low grade temp to 100, SOB, chest discomfort across the costal margins from the coughing, & incr SOB "like I can't get a DB";  also notes weak & BP noted to be running low 100/64 today in office:  **we discussed further eval w/ CXR (clear, NAD); and Labs (all WNL);  Rx w/ Depo80, Pred taper, ZPak, Mucinex, Hydromet, rest, fluids, etc;  hold Diovan in light of low BP til home checks improve.    Pt saw [facility] for Cards f/u 1/12 & stable> hx palpit, norm Myoview 2005 but poor aerobic capacity noted, neg 2DEcho 10/10 w/o MVP seen (& norm LVF), subseq f/u Myoview 11/10 also reported normal...  ~  September 10, 2010:  75mo ROV & add-on to recheck BP> recent prob w/ BP that was low in DrGessner's office & her Diovan was held; then  called & we restarted 1/2 tab daily w/ home BP checks 120-140/ 62-72 range;  Recent labs reviewed> BUN & Creat were normal, not dehydrated...  We decided to keep the Diovan at 1/2 tab; she requests ROVs every three months...    She saw DrGessner 7/12 w/ diarrhea, weak, feeling "dehydrated"; had Staph infection of her thumb (paronychia) & given SeptraDS; increased anxiety w/ death in the family... He felt likely IBS & rec Align & fiber adjustment, CDiff was neg...  01/04/2012 ACUTE OV  Complains of Cough with fever for 4 days .  Coughing up thick green /white mucus.  Took advil . Fever has been up to 101 initially, but low grade last couple of days  Husband has been sick as well.  No hemoptysis , chest pain or edema.  No recent travel or abx use.   Last seen > 1year , has CPX planned .      Current Problems:   Hx of BRONCHITIS, RECURRENT (ICD-491.9) - she has a hx of recurrent bronchitic infections... ex-smoker, quit in the 1990's... seen for repeat exac 5/11 & 6/11 as above... ~  CXR 12/09 showed mild hyperinflation, few chr markings, NAD & stable... ~  CXR 5/11 showed old left rib fxs, clear, NAD.Marland Kitchen. ~  CXR 2/12 showed clear lungs, sl pectus deformity, NAD...  HYPERTENSION (ICD-401.9) - on DIOVAN 80mg - now  down to 1/2 tab w/ good control... BP = 120/78 today and similar at home, tolerating med well and denies visual changes, CP, syncope, edema, etc... she notes occas palpit & EKG shows PAC.  MITRAL VALVE PROLAPSE, HX OF (ICD-V12.50) - followed for Cardiology by [facility]... prev intol to Verelan w/ bradycardia... she has intermittent palpit... 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 ~  last 2DEcho 7/09 showed norm LV size & function, normal valves x mild-mod TR... ~  EKG 5/10 showed NSR, rsr' V1-2, NAD... ~  she saw [facility] 10/10 w/ hx palpit/ dizzy> event monitor showed sinus brady, PAC's & short run SVT; he was concerned about poss  sinus node dysfunction & continues to follow her closely... ~  2DEcho 10/10 showed normal LV w/ EF>55%, norm RV, normal valves (no MVP), etc... ~  Myoview 10/10 showed no evid scar or ischemia, normal LV & EF=73%, no changes... ~  EKG 8/11 showed NSR, rare PAC, otherw WNL...  HYPERCHOLESTEROLEMIA, BORDERLINE (ICD-272.4) - hx mild elevation of cholesterol w/ excellent HDL numbers- on diet alone... ~  FLP 3/08 showed TChol 215, TG 77, HDL 87, LDL 102 ~  FLP 7/09 by [facility] showed TChol 219, TG 65, HDL 85, LDL 97 ~  FLP 5/10 showed TChol 193, TG 41, HDL 74, LDL 110 ~  FLP 8/11 showed TChol 194, TG 42, HDL 76, LDL 110  HEMORRHOIDS (ICD-455.6) - prev GI eval by DrSam w/ colonoscopy 3/06 showing tortuous colon but essentially WNL...  OSTEOPOROSIS (ICD-733.00) - followed by DrMeisinger for GYN... she is intol to Fosamax and was on Miacalcin nasal spray but stopped this on her own last yr... she fell w/ several "cracked ribs" in 2007- eval & rx by DrDean for Ortho... she refuses Bisphosphonate therapy- "I can't take anything on an empty stomach- I get sick"... offered Reclast infusion but she inexplicably refuses this Rx as well... she does take calcium and MVI... ~  BMD here 5/06 showed TScores -1.18 in Spine, and -2.9 in left femoral neck. ~  labs 5/10 showed Vit D level = 48..,. rec to take OTC Vit D supplement. ~  BMD here 6/10 showed TScores -1.0 in Spine, and -2.6 in FemNecks> improved on Calcium, MVI, VitD...  PSYCHIATRIC DISORDER (ICD-300.9) - she is on disability from DrPlovsky- prev on Paxil & Alpraz, but now just on ATIVAN 0.5mg - taking 1/2 tab Bid.  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 she had Derm check by [facility] in 2005 w/ mult AK's frozen w/ liq nitrogen... she tells me that she is followed regularly by [facility] & everything has been OK.  Health  Maintenance - last saw DrMeisinger for GYN in 2008- she is due now... last Mammogram at [facility] in 2008 and due now... she had colonoscopy in 2006 as above...    Past Surgical History  Procedure Date  . Endometrial ablation 1976  . Melanoma excision 1999    superficial spreading melanoma right post shoulder  . Left thumb tendon repair 10/09  . Abdominal hysterectomy 1975  . Colonoscopy 04/15/2004    2006: Normal    Outpatient Encounter Prescriptions as of 01/04/2012  Medication Sig Dispense Refill  . buPROPion (WELLBUTRIN XL) 300 MG 24 hr tablet Take 300 mg by mouth daily.      . calcium-vitamin D (OSCAL WITH D) 500-200 MG-UNIT  per tablet Take 1 tablet by mouth 2 (two) times daily.        Marland Kitchen diltiazem (CARDIZEM CD) 120 MG 24 hr capsule Take 120 mg by mouth daily.        Marland Kitchen LORazepam (ATIVAN) 0.5 MG tablet 1/2 tab by mouth twice daily       . valsartan (DIOVAN) 80 MG tablet One tablet once a day  30 tablet  5  . [DISCONTINUED] buPROPion (WELLBUTRIN SR) 150 MG 12 hr tablet Take 150 mg by mouth 2 (two) times daily.       . flecainide (TAMBOCOR) 50 MG tablet Take 1 tablet (50 mg total) by mouth 2 (two) times daily.  60 tablet  3  . Probiotic Product (ALIGN) 4 MG CAPS Take 1 capsule by mouth daily.  28 capsule  0    Allergies  Allergen Reactions  . Alendronate Sodium     REACTION: pt states INTOL to Fosamax  . Avapro (Irbesartan)   . Verapamil     REACTION: Intol w/ bradycardia    Current Medications, Allergies, Past Medical History, Past Surgical History, Family History, and Social History were reviewed in [employer] record.    Review of Systems       Constitutional:   No  weight loss, night sweats,   +Fevers, chills, fatigue, or  lassitude.  HEENT:   No headaches,  Difficulty swallowing,  Tooth/dental problems, or  Sore throat,                No sneezing, itching, ear ache, nasal congestion, post nasal drip,   CV:  No chest pain,  Orthopnea, PND,  swelling in lower extremities, anasarca, dizziness, palpitations, syncope.   GI  No heartburn, indigestion, abdominal pain, nausea, vomiting, diarrhea, change in bowel habits, loss of appetite, bloody stools.   Resp:    No coughing up of blood.   Marland Kitchen  No chest wall deformity  Skin: no rash or lesions.  GU: no dysuria, change in color of urine, no urgency or frequency.  No flank pain, no hematuria   MS:  No joint pain or swelling.  No decreased range of motion.  No back pain.  Psych:  No change in mood or affect. No depression or anxiety.  No memory loss.       Objective:   Physical Exam     WD, WN, 66y/o WF in NAD.  GENERAL:  Alert & oriented; pleasant & cooperative... HEENT:  [place]/AT, EOM-full, PERRLA, EACs-clear, TMs-wnl, NOSE- sl red, THROAT- sl red w/ dry MM's... NECK:  Supple w/ fairROM; no JVD; normal carotid impulses w/o bruits; no thyromegaly or nodules palpated; no lymphadenopathy. CHEST:  Clear to P & A; without wheezes or rales.  HEART:  Regular Rhythm; without murmurs/ rubs/ or gallops detected... ABDOMEN:  Soft & nontender; normal bowel sounds; no organomegaly or masses palpated... EXT: without deformities,  mild arthritic changes in hands ; no varicose veins/ venous insuffic/ or edema. NEURO:  no focal neuro deficits... DERM:  No lesions noted; no rash etc...   Assessment & Plan:

## 2012-01-26 ENCOUNTER — Ambulatory Visit: Payer: Self-pay | Admitting: Pulmonary Disease

## 2012-01-26 ENCOUNTER — Telehealth: Payer: Self-pay | Admitting: Pulmonary Disease

## 2012-01-26 NOTE — Telephone Encounter (Signed)
Forward 2 pages from [facility] to Dr. Alroy Dust for review on 01-26-12 ym

## 2012-02-08 ENCOUNTER — Ambulatory Visit: Payer: Self-pay | Admitting: Pulmonary Disease

## 2012-02-15 ENCOUNTER — Telehealth: Payer: Self-pay | Admitting: Pulmonary Disease

## 2012-02-15 NOTE — Telephone Encounter (Signed)
Forward 2 [facility] to [facility] for review on 02-15-12 ym

## 2012-02-18 ENCOUNTER — Encounter: Payer: Self-pay | Admitting: Pulmonary Disease

## 2012-05-02 ENCOUNTER — Telehealth: Payer: Self-pay | Admitting: Pulmonary Disease

## 2012-05-02 DIAGNOSIS — F419 Anxiety disorder, unspecified: Secondary | ICD-10-CM

## 2012-05-02 DIAGNOSIS — F411 Generalized anxiety disorder: Secondary | ICD-10-CM

## 2012-05-02 DIAGNOSIS — K589 Irritable bowel syndrome without diarrhea: Secondary | ICD-10-CM

## 2012-05-02 DIAGNOSIS — M810 Age-related osteoporosis without current pathological fracture: Secondary | ICD-10-CM

## 2012-05-02 DIAGNOSIS — I10 Essential (primary) hypertension: Secondary | ICD-10-CM

## 2012-05-02 DIAGNOSIS — E785 Hyperlipidemia, unspecified: Secondary | ICD-10-CM

## 2012-05-02 NOTE — Telephone Encounter (Signed)
I spoke with spouse. heis aware message was received. Please advise what labs pt will need thanks Last OV sn 09/10/10 TP 01/04/12 Pending 07/08/11

## 2012-05-02 NOTE — Telephone Encounter (Signed)
ATC patient, No answer. LMOMTCB

## 2012-05-02 NOTE — Telephone Encounter (Signed)
LMOM TCB x1 to inform pt that lab orders were placed for 5.28.14  bmet cbcd Hepatic Lipid Vitamin d tsh

## 2012-05-02 NOTE — Telephone Encounter (Signed)
Patient aware order for labs was placed.

## 2012-05-23 ENCOUNTER — Encounter: Payer: Self-pay | Admitting: Adult Health

## 2012-05-23 ENCOUNTER — Ambulatory Visit (INDEPENDENT_AMBULATORY_CARE_PROVIDER_SITE_OTHER): Payer: Medicare Other | Admitting: Adult Health

## 2012-05-23 ENCOUNTER — Other Ambulatory Visit (INDEPENDENT_AMBULATORY_CARE_PROVIDER_SITE_OTHER): Payer: Medicare Other

## 2012-05-23 ENCOUNTER — Ambulatory Visit (INDEPENDENT_AMBULATORY_CARE_PROVIDER_SITE_OTHER)
Admission: RE | Admit: 2012-05-23 | Discharge: 2012-05-23 | Disposition: A | Discharge status: Home / Self Care | Payer: Medicare Other | Source: Ambulatory Visit | Attending: Adult Health | Admitting: Adult Health

## 2012-05-23 ENCOUNTER — Telehealth: Payer: Self-pay | Admitting: Pulmonary Disease

## 2012-05-23 VITALS — BP 118/68 | HR 109 | Temp 100.4°F | Ht 62.0 in | Wt 120.4 lb

## 2012-05-23 DIAGNOSIS — H539 Unspecified visual disturbance: Secondary | ICD-10-CM

## 2012-05-23 DIAGNOSIS — R51 Headache: Secondary | ICD-10-CM

## 2012-05-23 DIAGNOSIS — R112 Nausea with vomiting, unspecified: Secondary | ICD-10-CM

## 2012-05-23 DIAGNOSIS — R519 Headache, unspecified: Secondary | ICD-10-CM | POA: Insufficient documentation

## 2012-05-23 LAB — BASIC METABOLIC PANEL
CO2: 29 mEq/L (ref 19–32)
Calcium: 9.6 mg/dL (ref 8.4–10.5)
Creatinine, Ser: 1.2 mg/dL (ref 0.4–1.2)
GFR: 48.9 mL/min — ABNORMAL LOW (ref >60.00)
Glucose, Bld: 125 mg/dL — ABNORMAL HIGH (ref 70–99)

## 2012-05-23 LAB — CBC WITH DIFFERENTIAL/PLATELET
Basophils Absolute: 0 10*3/uL (ref 0.0–0.1)
Eosinophils Relative: 0.3 % (ref 0.0–5.0)
HCT: 42.3 % (ref 36.0–46.0)
Hemoglobin: 14.5 g/dL (ref 12.0–15.0)
Lymphs Abs: 0.5 10*3/uL — ABNORMAL LOW (ref 0.7–4.0)
MCV: 93.5 fl (ref 78.0–100.0)
Monocytes Absolute: 0.4 10*3/uL (ref 0.1–1.0)
Monocytes Relative: 7.4 % (ref 3.0–12.0)
Neutro Abs: 4.3 10*3/uL (ref 1.4–7.7)
RDW: 12.6 % (ref 11.5–14.6)

## 2012-05-23 MED ORDER — ONDANSETRON HCL 4 MG PO TABS: 4.0000 mg | ORAL_TABLET | Freq: Three times a day (TID) | ORAL | Status: DC | PRN

## 2012-05-23 MED ORDER — CEFDINIR 300 MG PO CAPS: 300.0000 mg | ORAL_CAPSULE | Freq: Two times a day (BID) | ORAL | Status: DC

## 2012-05-23 NOTE — Assessment & Plan Note (Addendum)
Persistent HA ? Etiology -may be referred pain from neck strain +/- right OM/URI  Although pt has abnormal eye exam with left sided decreased upward gaze .  Will set up for stat CT head along with labs w / ESR , cbc and bmet .  Will have her see her eye doctor for follow up this week.  She says this may be a chronic problem that she has double vision if she looks up for years and eye doctor is aware .   Plan  Omnicef 300mg  Twice daily  For 7 days  Zyrtec 10mg  At bedtime  For 5 days  Zofran 4mg  every 8hrs as needed for nausea.  Fluids and rest  Saline nasal rinses as needed  Alternate ice and heat to neck  We are setting you up for CT head  Need ov with eye doctor for left eye - decreased upward gaze  I will call with lab results.  Please contact office for sooner follow up if symptoms do not improve or worsen or seek emergency care  Follow up Dr. Kriste Basque  Next month for physical and As needed

## 2012-05-23 NOTE — Patient Instructions (Addendum)
Omnicef 300mg  Twice daily  For 7 days  Zyrtec 10mg  At bedtime  For 5 days  Zofran 4mg  every 8hrs as needed for nausea.  Fluids and rest  Saline nasal rinses as needed  Alternate ice and heat to neck  We are setting you up for CT head  Need ov with eye doctor for left eye - decreased upward gaze  I will call with lab results.  Please contact office for sooner follow up if symptoms do not improve or worsen or seek emergency care  Follow up Dr. Kriste Basque  Next month for physical and As needed

## 2012-05-23 NOTE — Progress Notes (Signed)
Subjective:    Patient ID: Chloe Perkins, female    DOB: Nov 23, 1945, 67 y.o.   MRN: [id number]  HPI  67 y/o WF       She has hx of  recurrent bronchitic infections, HBP, ?MVP, Borderline cholesterol, Osteoporosis, Hx of melanoma, and psyche disorder followed by DrPlovsky (on disability for this).    She is also followed by [facility] for Cards- HBP, ?MVP, & palpit w/ prev Holter monitor that she reports as neg, but notes that she was allergic to the pads w/ blisters ("from both types").  ~  July 10, 2009:  she saw TP 68mo ago w/ URI- cough, gray mucous, drainage, wheezing, SOB> treated w/ Omnicef, Pred, Zyrtek, Hydromet (never filled this) etc... Improved & almost back to baseline then recurrent symptoms> cough, clear thick phlegm, congestion, no hemoptysis, no CP, no palpit, etc... she is very distraught over these symptoms & we discussed further eval w/ CXR (clear, NAD); and blood work (all essent WNL)... we discussed Rx w/ Depo80, Pred 20mg - 3d taper, Guaifenesin 400mg - 1-2Bid (she was intol to MucinexMax), + rest/ fluids/ Delsym/ etc >> all symptoms resolved.  ~  April 07, 2010:  68mo ROV & add-on for recurrent bronchitic symptoms> started w/ a "cold" c/o dry nonproductive cough, low grade temp to 100, SOB, chest discomfort across the costal margins from the coughing, & incr SOB "like I can't get a DB";  also notes weak & BP noted to be running low 100/64 today in office:  **we discussed further eval w/ CXR (clear, NAD); and Labs (all WNL);  Rx w/ Depo80, Pred taper, ZPak, Mucinex, Hydromet, rest, fluids, etc;  hold Diovan in light of low BP til home checks improve.    Pt saw [facility] for Cards f/u 1/12 & stable> hx palpit, norm Myoview 2005 but poor aerobic capacity noted, neg 2DEcho 10/10 w/o MVP seen (& norm LVF), subseq f/u Myoview 11/10 also reported normal...  ~  September 10, 2010:  68mo ROV & add-on to recheck BP> recent prob w/ BP that was low in DrGessner's office & her Diovan was held; then  called & we restarted 1/2 tab daily w/ home BP checks 120-140/ 62-72 range;  Recent labs reviewed> BUN & Creat were normal, not dehydrated...  We decided to keep the Diovan at 1/2 tab; she requests ROVs every three months...    She saw DrGessner 7/12 w/ diarrhea, weak, feeling "dehydrated"; had Staph infection of her thumb (paronychia) & given SeptraDS; increased anxiety w/ death in the family... He felt likely IBS & rec Align & fiber adjustment, CDiff was neg...  05/23/2012 ACUTE OV  Complains of right sided headache for 2 weeks.  Has had thick mucus in throat for last 2 days .  Had dry heaves with thick mucus.  Yesterday am vomited after eating.  Has had right ear pain for 2 weeks.  Denies diarhea, f/c/s, SOB, wheezing, chest tightness, congestion, or cough.  NO visual or speech changes , no arm weakness, no syncope or dizziness.  Last seen > 1.5 year , has CPX planned in few weeks with Dr. Kriste Basque   Taking motrin As needed  For headache.  Went to UC 2 days ago, dx with Trigeminal Neuralgia. , rx naprosyn and vicodin.  Seen ENT 1 week, no ear or sinus infection found, no rx given.  Seen ortho 2 weeks with right neck strain, prednisone taper  No records available.  Does have double vision when she looks up -"this  has been like this for years "    Current Problems:   Hx of BRONCHITIS, RECURRENT (ICD-491.9) - she has a hx of recurrent bronchitic infections... ex-smoker, quit in the 1990's... seen for repeat exac 5/11 & 6/11 as above... ~  CXR 12/09 showed mild hyperinflation, few chr markings, NAD & stable... ~  CXR 5/11 showed old left rib fxs, clear, NAD.Marland Kitchen. ~  CXR 2/12 showed clear lungs, sl pectus deformity, NAD...  HYPERTENSION (ICD-401.9) - on DIOVAN 80mg - now down to 1/2 tab w/ good control... BP = 120/78 today and similar at home, tolerating med well and denies visual changes, CP, syncope, edema, etc... she notes occas palpit & EKG shows PAC.  MITRAL VALVE PROLAPSE, HX OF (ICD-V12.50)  - followed for Cardiology by [facility]... prev intol to Verelan w/ bradycardia... she has intermittent palpit... 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 ~  last 2DEcho 7/09 showed norm LV size & function, normal valves x mild-mod TR... ~  EKG 5/10 showed NSR, rsr' V1-2, NAD... ~  she saw [facility] 10/10 w/ hx palpit/ dizzy> event monitor showed sinus brady, PAC's & short run SVT; he was concerned about poss sinus node dysfunction & continues to follow her closely... ~  2DEcho 10/10 showed normal LV w/ EF>55%, norm RV, normal valves (no MVP), etc... ~  Myoview 10/10 showed no evid scar or ischemia, normal LV & EF=73%, no changes... ~  EKG 8/11 showed NSR, rare PAC, otherw WNL...  HYPERCHOLESTEROLEMIA, BORDERLINE (ICD-272.4) - hx mild elevation of cholesterol w/ excellent HDL numbers- on diet alone... ~  FLP 3/08 showed TChol 215, TG 77, HDL 87, LDL 102 ~  FLP 7/09 by [facility] showed TChol 219, TG 65, HDL 85, LDL 97 ~  FLP 5/10 showed TChol 193, TG 41, HDL 74, LDL 110 ~  FLP 8/11 showed TChol 194, TG 42, HDL 76, LDL 110  HEMORRHOIDS (ICD-455.6) - prev GI eval by DrSam w/ colonoscopy 3/06 showing tortuous colon but essentially WNL...  OSTEOPOROSIS (ICD-733.00) - followed by DrMeisinger for GYN... she is intol to Fosamax and was on Miacalcin nasal spray but stopped this on her own last yr... she fell w/ several "cracked ribs" in 2007- eval & rx by DrDean for Ortho... she refuses Bisphosphonate therapy- "I can't take anything on an empty stomach- I get sick"... offered Reclast infusion but she inexplicably refuses this Rx as well... she does take calcium and MVI... ~  BMD here 5/06 showed TScores -1.18 in Spine, and -2.9 in left femoral neck. ~  labs 5/10 showed Vit D level = 48..,. rec to take OTC Vit D supplement. ~  BMD here 6/10 showed TScores -1.0 in Spine, and -2.6 in FemNecks> improved on Calcium, MVI, VitD...  PSYCHIATRIC DISORDER (ICD-300.9) -  she is on disability from DrPlovsky- prev on Paxil & Alpraz, but now just on ATIVAN 0.5mg - taking 1/2 tab Bid.  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 she had Derm check by [facility] in 2005 w/ mult AK's frozen w/ liq nitrogen... she tells me that she is followed regularly by [facility] & everything has been OK.  Health Maintenance - last saw DrMeisinger for GYN in 2008- she is due now... last Mammogram at [facility] in 2008 and due now... she had colonoscopy in 2006 as above...    Past Surgical History  Procedure Laterality Date  . Endometrial ablation  1976  . Melanoma excision  1999    superficial spreading melanoma right post shoulder  . Left thumb tendon repair  10/09  . Abdominal hysterectomy  1975  . Colonoscopy  04/15/2004    2006: Normal    Outpatient Encounter Prescriptions as of 05/23/2012  Medication Sig Dispense Refill  . buPROPion (WELLBUTRIN XL) 300 MG 24 hr tablet Take 300 mg by mouth daily.      . calcium-vitamin D (OSCAL WITH D) 500-200 MG-UNIT per tablet Take 1 tablet by mouth 2 (two) times daily.        Marland Kitchen diltiazem (CARDIZEM CD) 120 MG 24 hr capsule Take 120 mg by mouth daily.        . flecainide (TAMBOCOR) 50 MG tablet Take 1 tablet (50 mg total) by mouth 2 (two) times daily.  60 tablet  3  . LORazepam (ATIVAN) 0.5 MG tablet Take 0.5 mg by mouth 2 (two) times daily.       . Probiotic Product (ALIGN) 4 MG CAPS Take 1 capsule by mouth daily.  28 capsule  0  . valsartan (DIOVAN) 80 MG tablet One tablet once a day  30 tablet  5   No facility-administered encounter medications on file as of 05/23/2012.    Allergies  Allergen Reactions  . Alendronate Sodium     REACTION: pt states INTOL to Fosamax  . Avapro (Irbesartan)   . Verapamil     REACTION: Intol w/ bradycardia    Current Medications, Allergies, Past Medical History, Past  Surgical History, Family History, and Social History were reviewed in [employer] record.    Review of Systems       Constitutional:   No  weight loss, night sweats,   +Fevers, chills, fatigue, or  lassitude.  HEENT:   No Difficulty swallowing,  Tooth/dental problems, or  Sore throat,                No sneezing, itching,  +ear ache, nasal congestion, post nasal drip,   CV:  No chest pain,  Orthopnea, PND, swelling in lower extremities, anasarca, dizziness, palpitations, syncope.   GI  No heartburn, indigestion, abdominal pain, nausea, vomiting, diarrhea, change in bowel habits, loss of appetite, bloody stools.   Resp:    No coughing up of blood.   Marland Kitchen  No chest wall deformity  Skin: no rash or lesions.  GU: no dysuria, change in color of urine, no urgency or frequency.  No flank pain, no hematuria   MS:  Right sided neck and shoulder pain.  Psych:  No change in mood or affect. No depression or anxiety.  No memory loss.       Objective:   Physical Exam     WD, WN, 66y/o WF in NAD.  GENERAL:  Alert & oriented; pleasant & cooperative... HEENT:  [place]/AT,  PERRLA, EACs-clear, TMs right swollen, NOSE- sl red, THROAT- sl red w/ dry MM's...nontender sinus ,  Left upward gaze decreased , right sided EOMI w/o nystagmus.  NECK:  Supple w/ fairROM; no JVD; normal carotid impulses w/o bruits; no thyromegaly or nodules palpated; no lymphadenopathy. CHEST:  Clear to P & A; without wheezes or rales.  HEART:  Regular Rhythm; without murmurs/ rubs/ or gallops detected... ABDOMEN:  Soft & nontender; normal bowel sounds; no organomegaly or masses palpated... EXT: without deformities,  mild arthritic changes in hands ; no varicose veins/ venous insuffic/ or edema. ROM of neck  nml and shoulders nml. Tender along upper right shoulder and lateral neck.  NEURO:  no focal neuro deficits... DERM:  No lesions noted; no rash etc...   Assessment & Plan:

## 2012-05-23 NOTE — Telephone Encounter (Signed)
I spoke with pt spouse and advised him SN had no openings. Pt needs to keep her scheduled appt with TP and SN that is upcoming since she has not seen him sine 2012. He voiced his understanding, nothing further was needed

## 2012-05-25 ENCOUNTER — Emergency Department (HOSPITAL_COMMUNITY): Payer: Medicare Other

## 2012-05-25 ENCOUNTER — Telehealth: Payer: Self-pay | Admitting: Pulmonary Disease

## 2012-05-25 ENCOUNTER — Encounter (HOSPITAL_COMMUNITY): Payer: Self-pay | Admitting: Family Medicine

## 2012-05-25 ENCOUNTER — Emergency Department (HOSPITAL_COMMUNITY)
Admission: EM | Admit: 2012-05-25 | Discharge: 2012-05-25 | Disposition: A | Discharge status: Home / Self Care | Payer: Medicare Other | Attending: Emergency Medicine | Admitting: Emergency Medicine

## 2012-05-25 DIAGNOSIS — Z8619 Personal history of other infectious and parasitic diseases: Secondary | ICD-10-CM | POA: Insufficient documentation

## 2012-05-25 DIAGNOSIS — M810 Age-related osteoporosis without current pathological fracture: Secondary | ICD-10-CM | POA: Insufficient documentation

## 2012-05-25 DIAGNOSIS — Z862 Personal history of diseases of the blood and blood-forming organs and certain disorders involving the immune mechanism: Secondary | ICD-10-CM | POA: Insufficient documentation

## 2012-05-25 DIAGNOSIS — R112 Nausea with vomiting, unspecified: Secondary | ICD-10-CM | POA: Insufficient documentation

## 2012-05-25 DIAGNOSIS — Z79899 Other long term (current) drug therapy: Secondary | ICD-10-CM | POA: Insufficient documentation

## 2012-05-25 DIAGNOSIS — R51 Headache: Secondary | ICD-10-CM | POA: Insufficient documentation

## 2012-05-25 DIAGNOSIS — I10 Essential (primary) hypertension: Secondary | ICD-10-CM | POA: Insufficient documentation

## 2012-05-25 DIAGNOSIS — Z8639 Personal history of other endocrine, nutritional and metabolic disease: Secondary | ICD-10-CM | POA: Insufficient documentation

## 2012-05-25 DIAGNOSIS — Z85820 Personal history of malignant melanoma of skin: Secondary | ICD-10-CM | POA: Insufficient documentation

## 2012-05-25 DIAGNOSIS — J45909 Unspecified asthma, uncomplicated: Secondary | ICD-10-CM | POA: Insufficient documentation

## 2012-05-25 DIAGNOSIS — Z8679 Personal history of other diseases of the circulatory system: Secondary | ICD-10-CM | POA: Insufficient documentation

## 2012-05-25 DIAGNOSIS — H6690 Otitis media, unspecified, unspecified ear: Secondary | ICD-10-CM | POA: Insufficient documentation

## 2012-05-25 DIAGNOSIS — R509 Fever, unspecified: Secondary | ICD-10-CM | POA: Insufficient documentation

## 2012-05-25 DIAGNOSIS — Z87891 Personal history of nicotine dependence: Secondary | ICD-10-CM | POA: Insufficient documentation

## 2012-05-25 DIAGNOSIS — H9319 Tinnitus, unspecified ear: Secondary | ICD-10-CM | POA: Insufficient documentation

## 2012-05-25 DIAGNOSIS — F341 Dysthymic disorder: Secondary | ICD-10-CM | POA: Insufficient documentation

## 2012-05-25 DIAGNOSIS — H6691 Otitis media, unspecified, right ear: Secondary | ICD-10-CM

## 2012-05-25 LAB — CBC WITH DIFFERENTIAL/PLATELET
Basophils Absolute: 0 10*3/uL (ref 0.0–0.1)
Basophils Relative: 1 % (ref 0–1)
Eosinophils Absolute: 0.1 10*3/uL (ref 0.0–0.7)
HCT: 39.4 % (ref 36.0–46.0)
Hemoglobin: 13.7 g/dL (ref 12.0–15.0)
MCH: 31.4 pg (ref 26.0–34.0)
MCHC: 34.8 g/dL (ref 30.0–36.0)
Monocytes Absolute: 0.3 10*3/uL (ref 0.1–1.0)
Monocytes Relative: 10 % (ref 3–12)
Neutro Abs: 2 10*3/uL (ref 1.7–7.7)
Neutrophils Relative %: 68 % (ref 43–77)
RDW: 12.2 % (ref 11.5–15.5)

## 2012-05-25 LAB — POCT I-STAT, CHEM 8
Calcium, Ion: 1.2 mmol/L (ref 1.13–1.30)
Chloride: 102 mEq/L (ref 96–112)
Glucose, Bld: 93 mg/dL (ref 70–99)
HCT: 42 % (ref 36.0–46.0)
Hemoglobin: 14.3 g/dL (ref 12.0–15.0)

## 2012-05-25 LAB — SEDIMENTATION RATE: Sed Rate: 31 mm/hr — ABNORMAL HIGH (ref 0–22)

## 2012-05-25 MED ORDER — SODIUM CHLORIDE 0.9 % IV SOLN
1000.0000 mL | Freq: Once | INTRAVENOUS | Status: AC
  Administered 2012-05-25: 1000 mL via INTRAVENOUS

## 2012-05-25 MED ORDER — ACETAMINOPHEN 325 MG PO TABS
650.0000 mg | ORAL_TABLET | Freq: Once | ORAL | Status: AC
  Administered 2012-05-25: 650 mg via ORAL
  Filled 2012-05-25: qty 2

## 2012-05-25 MED ORDER — ONDANSETRON HCL 4 MG/2ML IJ SOLN
4.0000 mg | Freq: Once | INTRAMUSCULAR | Status: AC
  Administered 2012-05-25: 4 mg via INTRAVENOUS
  Filled 2012-05-25: qty 2

## 2012-05-25 MED ORDER — GADOBENATE DIMEGLUMINE 529 MG/ML IV SOLN
11.0000 mL | Freq: Once | INTRAVENOUS | Status: AC
  Administered 2012-05-25: 11 mL via INTRAVENOUS

## 2012-05-25 NOTE — ED Provider Notes (Addendum)
Pt with 2 week hx of right ear pain radiating to face.  No vision loss.  +tinnitus.  Some mild vertigo like symptoms.  No other neuro deficits.  +fevers until yesterday, tx for right OM.  Symptoms worse with associated vomiting.  Head CT negative.  Will check MRI, labs.   Date: 05/25/2012  Rate: 78  Rhythm: normal sinus rhythm  QRS Axis: normal  Intervals: normal  ST/T Wave abnormalities: normal  Conduction Disutrbances:none  Narrative Interpretation:   Old EKG Reviewed: none available    Rolan Bucco, MD 05/25/12 1751  Rolan Bucco, MD 05/25/12 1932

## 2012-05-25 NOTE — Telephone Encounter (Signed)
Per SN---  Now c/o fever, n/v, persistant headache--this will need to be worked up further--  She will need ER eval and possibly a neuro eval.   She has the zofran for the nausea Motrin for fever And should be doing a clear liquid diet.    The ct scan that was done by TP was negative.

## 2012-05-25 NOTE — Progress Notes (Signed)
Quick Note:  Pt's husband aware per 4.15.14 phone note. ______ 

## 2012-05-25 NOTE — ED Notes (Signed)
Pt in MRI, husband remains in room.

## 2012-05-25 NOTE — ED Notes (Signed)
Per pt was dx with fluid behind her ear and not getting better. Sts now having vomiting and fever.

## 2012-05-25 NOTE — Progress Notes (Signed)
Quick Note:  Pt's husband aware per 4.15.14 phone note. ______

## 2012-05-25 NOTE — ED Provider Notes (Signed)
History     CSN: [id number]  Arrival date & time 05/25/12  1602   First MD Initiated Contact with Patient 05/25/12 1635      Chief Complaint  Patient presents with  . Otalgia  . Vomiting  . Fever    (Consider location/radiation/quality/duration/timing/severity/associated sxs/prior treatment) Patient is a 67 y.o. female presenting with ear pain and fever. The history is provided by the patient, the spouse and medical records.  Otalgia Location:  Right Quality:  Aching and throbbing Severity:  Mild Onset quality:  Gradual Duration:  2 weeks Timing:  Constant Progression:  Waxing and waning Chronicity:  New Associated symptoms: fever, tinnitus and vomiting   Associated symptoms: no abdominal pain, no cough, no diarrhea, no neck pain, no rash and no sore throat   Associated symptoms comment:  Right-sided facial pain overlying distribution of right cheek  Fever:    Duration:  3 days   Progression:  Resolved Fever Associated symptoms: chills, ear pain, nausea and vomiting   Associated symptoms: no chest pain, no confusion, no cough, no diarrhea, no dysuria, no rash and no sore throat     Past Medical History  Diagnosis Date  . Hypertension   . Mitral valve prolapse   . HLD (hyperlipidemia)   . Hemorrhoids   . Osteoporosis   . Personal history of malignant melanoma of skin   . Viral gastroenteritis   . Anxiety and depression   . History of melanoma   . Asthmatic bronchitis     Past Surgical History  Procedure Laterality Date  . Endometrial ablation  1976  . Melanoma excision  1999    superficial spreading melanoma right post shoulder  . Left thumb tendon repair  10/09  . Abdominal hysterectomy  1975  . Colonoscopy  04/15/2004    2006: Normal    Family History  Problem Relation Age of Onset  . Emphysema Father     PGF, brother, sister  . COPD Father     PGF, brother, sister  . Rheum arthritis Mother   . Lung cancer Paternal Grandfather   . Stroke Mother    . Heart disease Mother     Father, brother, sister  . Ovarian cancer Paternal Aunt   . Breast cancer Cousin     History  Substance Use Topics  . Smoking status: Former Smoker -- 0.50 packs/day for 20 years    Types: Cigarettes    Quit date: 02/09/1990  . Smokeless tobacco: Not on file  . Alcohol Use: No    OB History   Grav Para Term Preterm Abortions TAB SAB Ect Mult Living                  Review of Systems  Constitutional: Positive for fever and chills. Negative for activity change and appetite change.  HENT: Positive for ear pain and tinnitus. Negative for sore throat, neck pain and neck stiffness.   Respiratory: Negative for cough, chest tightness, shortness of breath and wheezing.   Cardiovascular: Negative for chest pain and palpitations.  Gastrointestinal: Positive for nausea and vomiting. Negative for abdominal pain, diarrhea and constipation.  Genitourinary: Negative for dysuria, decreased urine volume and difficulty urinating.  Skin: Negative for rash and wound.  Neurological: Negative for dizziness, seizures, syncope, facial asymmetry, weakness and light-headedness.  Psychiatric/Behavioral: Negative for confusion and agitation. The patient is not nervous/anxious.   All other systems reviewed and are negative.    Allergies  Alendronate sodium; Avapro; and Verapamil  Home Medications  Current Outpatient Rx  Name  Route  Sig  Dispense  Refill  . buPROPion (WELLBUTRIN XL) 300 MG 24 hr tablet   Oral   Take 300 mg by mouth daily.         . calcium-vitamin D (OSCAL WITH D) 500-200 MG-UNIT per tablet   Oral   Take 1 tablet by mouth 2 (two) times daily.           . cefdinir (OMNICEF) 300 MG capsule   Oral   Take 1 capsule (300 mg total) by mouth 2 (two) times daily.   14 capsule   0   . diltiazem (CARDIZEM CD) 120 MG 24 hr capsule   Oral   Take 120 mg by mouth daily.           . flecainide (TAMBOCOR) 50 MG tablet   Oral   Take 1 tablet (50 mg  total) by mouth 2 (two) times daily.   60 tablet   3   . LORazepam (ATIVAN) 0.5 MG tablet   Oral   Take 0.5 mg by mouth 2 (two) times daily.          . ondansetron (ZOFRAN) 4 MG tablet   Oral   Take 1 tablet (4 mg total) by mouth every 8 (eight) hours as needed for nausea.   30 tablet   0   . Probiotic Product (ALIGN) 4 MG CAPS   Oral   Take 1 capsule by mouth daily.   28 capsule   0     Take 1 by mouth every day x 1 month Samples given ...   . valsartan (DIOVAN) 80 MG tablet      One tablet once a day   30 tablet   5     BP 148/80  Pulse 94  Temp(Src) 98.1 F (36.7 C) (Oral)  Resp 16  SpO2 96%  Physical Exam  Nursing note and vitals reviewed. Constitutional: She is oriented to person, place, and time. She appears well-developed and well-nourished.  HENT:  Head: Normocephalic and atraumatic.  Right Ear: External ear normal.  Left Ear: External ear normal.  Nose: Nose normal.  Mouth/Throat: Oropharynx is clear and moist. No oropharyngeal exudate.  Mild TTP overlying right face; moderate TTP overlying right pinna; mild TTP overlying right temporal artery   Eyes: Conjunctivae are normal. Pupils are equal, round, and reactive to light.  Left pupil with slightly diminished upward gaze; per pt report, unchanged for "years"   Neck: Normal range of motion. Neck supple.  Cardiovascular: Normal rate, regular rhythm, normal heart sounds and intact distal pulses.  Exam reveals no gallop and no friction rub.   No murmur heard. Pulmonary/Chest: Effort normal and breath sounds normal. No respiratory distress. She has no wheezes. She has no rales. She exhibits no tenderness.  Abdominal: Soft. Bowel sounds are normal. She exhibits no distension and no mass. There is no tenderness. There is no rebound and no guarding.  Musculoskeletal: Normal range of motion. She exhibits no edema and no tenderness.  Neurological: She is alert and oriented to person, place, and time. She  displays normal reflexes. No cranial nerve deficit. She exhibits normal muscle tone. Coordination normal.  Skin: Skin is warm and dry.  Psychiatric: She has a normal mood and affect. Her behavior is normal. Judgment and thought content normal.    ED Course  Procedures (including critical care time)  Labs Reviewed  SEDIMENTATION RATE - Abnormal; Notable for the following:  Sed Rate 31 (*)    All other components within normal limits  CBC WITH DIFFERENTIAL - Abnormal; Notable for the following:    WBC 2.9 (*)    Platelets 141 (*)    Lymphs Abs 0.6 (*)    All other components within normal limits  POCT I-STAT, CHEM 8   Mr Laqueta Jean Wo Contrast  05/25/2012  *RADIOLOGY REPORT*  Clinical Data: Otalgia on the right, vomiting, fever, tinnitus, mild vertigo like symptoms.  MRI HEAD WITHOUT AND WITH CONTRAST  Technique:  Multiplanar, multiecho pulse sequences of the brain and surrounding structures were obtained according to standard protocol without and with intravenous contrast  Contrast: 11mL MULTIHANCE GADOBENATE DIMEGLUMINE 529 MG/ML IV SOLN  Comparison: CT head 05/23/2012.CT head 03/20/2005.  Report of MR head from 2003.  Findings: There is no evidence for acute infarction, intracranial hemorrhage, mass lesion, hydrocephalus, or extra-axial fluid. There is mild age related atrophy.  Mild chronic microvascular ischemic change was described on the prior MR of the orbits, and is likely stable/age related.  No foci of chronic hemorrhage.  No large vessel cortical infarct.  No midline abnormality.  Thin section imaging through the posterior fossa reveals no evidence for posterior fossa lesions or temporal bone inflammatory process. Both right and left petrous apexes are poorly pneumatized, a normal variant.  No abnormal enhancement is seen in the temporal bones post contrast. Post contrast images were obtained through the entire brain, and there is no abnormal enhancement of brain or meninges.  Note is  made that the opening of the eustachian tube into the nasopharynx is more patulous on the left than on the right ( image 3 series 14).  It is possible there may be some right-sided nasopharyngeal inflammation and/or asymmetric adenoidal hypertrophy, but I do not see a  mucosal lesion.  There is no evidence for eustachian tube dysfunction such as asymmetric mastoid effusion on the right.  The major intracranial vessels structures are widely patent, including the dural venous sinuses.  No osseous lesions. No paranasal sinus disease.  Extracranial soft tissues unremarkable, except for bilateral posterior cervical chain lymphadenopathy, incompletely evaluated.  Correlate with physical exam.  IMPRESSION: Mild atrophy and chronic microvascular ischemic change.  No acute intracranial findings.  Special attention was directed to the posterior fossa and right temporal bone where no abnormalities are detected.  The opening of the eustachian tube into the nasopharynx is more narrowed on the right than the left.  The significance of this is uncertain.  If there is signs and symptoms of right-sided pharyngitis, elective ENT consultation may be warranted.  Bilateral posterior cervical lymphadenopathy, incompletely evaluated.   Original Report Authenticated By: Davonna Belling, M.D.      1. Headache   2. Facial pain   3. Otitis media, right      Date: 05/25/2012  Rate: 78 bpm  Rhythm: normal sinus rhythm  QRS Axis: normal  Intervals: normal  ST/T Wave abnormalities: nonspecific ST changes  Conduction Disutrbances:none  Narrative Interpretation: Sinus rhythm without acute ischemia  Old EKG Reviewed: unchanged     MDM  67 yo F presents for 2 weeks of right-sided facial and right ear pain and 3 days of fever/chills and nausea/vomiting. Febrile to 101F at home yesterday; no longer febrile. Started on [place] for acute otitis media 2 days ago by PCP; despite fever resolving, nausea/vomiting and vertigo seem to have  worsened. Exam not c/w otitis media. Vertigo positional in nature, associated with tinnitus, and worsened with palpation  of entire right face; doubt central cause of vertigo. Not localized to right temporal artery region and ESR only slightly elevated; no visual changes 2 weeks after onset of symptoms; doubt temporal arteritis. However, due to fever, vertigo, and tinnitus, brain MRI obtained and negative for acoustic neuroma, cerebellar CVA, or mastoiditis. Pt's symptoms improved with symptomatic treatment; plans to f/u with PCP later this week. Patient given return precautions, including worsening of signs or symptoms. Patient instructed to follow-up with primary care physician.           Clemetine Marker, MD 05/25/12 [phone]

## 2012-05-25 NOTE — ED Provider Notes (Signed)
I saw and evaluated the patient, reviewed the resident's note and I agree with the findings and plan.   Chloe Bucco, MD 05/25/12 [phone]

## 2012-05-25 NOTE — ED Notes (Signed)
Pt st's she started with ear ache first of week and was put on antibiotic for same.  St's she now has nausea and vomiting and ear is not any better.  Also st's she has had elevated temp.

## 2012-05-25 NOTE — Telephone Encounter (Signed)
Spoke with Sammy-pt's husband-aware of recs from SN and will get pt up to get dressed and take her to the ER today to be evaluated.

## 2012-05-25 NOTE — Telephone Encounter (Signed)
Per 4.14.14 labs and CT: Notes Recorded by Julio Sicks, NP on 05/23/2012 at 5:33 PM No evidence of acute issue Would like her to see eye doctor  May need further evaluation of left eye issues w/ neuro referral +/- MRI  Please contact office for sooner follow up if symptoms do not improve or worsen or seek emergency care   Notes Recorded by Julio Sicks, NP on 05/23/2012 at 5:31 PM Labs are unremarkable  Nothing to explain symptoms  Cont w/ ov recs  Please contact office for sooner follow up if symptoms do not improve or worsen or seek emergency care   Called spoke with patient's husband Sammy who reported pt is still: Nausea/vomiting Fever yesterday up to 102.2 - motrin helped Decreased appetite, no BM in 3 days Still has headache Not following BRAT diet Is taking the omnicef as directed Denies abdominal bloating, tenderness TP not in office today or tomorrow Last ov w/ SN 8.1.12, has cpx scheduled for 5.29.14 Dr Kriste Basque please advise, thanks. Allergies  Allergen Reactions  . Alendronate Sodium     REACTION: pt states INTOL to Fosamax  . Avapro (Irbesartan)   . Verapamil     REACTION: Intol w/ bradycardia   **husband is aware of lab/CT results.

## 2012-06-10 ENCOUNTER — Encounter: Payer: Self-pay | Admitting: Pulmonary Disease

## 2012-06-13 ENCOUNTER — Telehealth: Payer: Self-pay | Admitting: Pulmonary Disease

## 2012-06-13 NOTE — Telephone Encounter (Signed)
[email or web address].Jennifer Castillo, CMA  

## 2012-06-13 NOTE — Telephone Encounter (Signed)
Per SN - work pt in tomorrow at 2:30pm with him  [facility]

## 2012-06-13 NOTE — Telephone Encounter (Signed)
Spoke to pt. She states that she doesn't want to see a NP, she wants to see a MD. SN doesn't have anything for several weeks. Pt was asking if there was another MD in our office that she can see. I advised that with her having issues with her BP, SN would need to see her TP because SN is her PCP. She was against this because the last 2 times she saw TP, she was missed dx and had to take a bunch of medications she didn't need. Pt asked several times about being seen by Cardiology.  SN please advise what we should do with this pt. Thanks.

## 2012-06-13 NOTE — Telephone Encounter (Signed)
Spoke to pt's husband. SN had a cancellation at 3pm tomorrow. Pt has been scheduled to see SN 06/14/12 @ 3pm.

## 2012-06-14 ENCOUNTER — Encounter: Payer: Self-pay | Admitting: Pulmonary Disease

## 2012-06-14 ENCOUNTER — Ambulatory Visit (INDEPENDENT_AMBULATORY_CARE_PROVIDER_SITE_OTHER): Payer: Medicare Other | Admitting: Pulmonary Disease

## 2012-06-14 VITALS — BP 142/76 | HR 67 | Temp 98.0°F | Ht 63.0 in | Wt 117.0 lb

## 2012-06-14 DIAGNOSIS — E785 Hyperlipidemia, unspecified: Secondary | ICD-10-CM

## 2012-06-14 DIAGNOSIS — F329 Major depressive disorder, single episode, unspecified: Secondary | ICD-10-CM

## 2012-06-14 DIAGNOSIS — I10 Essential (primary) hypertension: Secondary | ICD-10-CM

## 2012-06-14 DIAGNOSIS — R002 Palpitations: Secondary | ICD-10-CM

## 2012-06-14 DIAGNOSIS — M810 Age-related osteoporosis without current pathological fracture: Secondary | ICD-10-CM

## 2012-06-14 DIAGNOSIS — K2970 Gastritis, unspecified, without bleeding: Secondary | ICD-10-CM | POA: Insufficient documentation

## 2012-06-14 DIAGNOSIS — F341 Dysthymic disorder: Secondary | ICD-10-CM

## 2012-06-14 DIAGNOSIS — R51 Headache: Secondary | ICD-10-CM

## 2012-06-14 MED ORDER — OMEPRAZOLE 20 MG PO CPDR: 20.0000 mg | DELAYED_RELEASE_CAPSULE | Freq: Every day | ORAL | Status: DC

## 2012-06-14 MED ORDER — ATENOLOL 25 MG PO TABS: 25.0000 mg | ORAL_TABLET | Freq: Every day | ORAL | Status: DC

## 2012-06-14 MED ORDER — TRAMADOL HCL 50 MG PO TABS: 50.0000 mg | ORAL_TABLET | Freq: Three times a day (TID) | ORAL | Status: DC | PRN

## 2012-06-14 NOTE — Patient Instructions (Addendum)
Today we updated your med list in our EPIC system...    Continue your current medications the same...  We decided to add the following meds at this time>>    Atenolol 25mg  one tab daily for BP...    Tramadol 50mg - one tab up to 3 times daily as needed for pain, headaches, etc...    Omeprazole 20mg - one tab taken 30 min before the 1st meal of the day...  We will plan your recheck & Physical as prev sched later this month.Marland KitchenMarland Kitchen

## 2012-06-17 ENCOUNTER — Telehealth: Payer: Self-pay | Admitting: Pulmonary Disease

## 2012-06-17 NOTE — Telephone Encounter (Signed)
Per SN---  Will do so when he gets closer to retirement----advised the pt of his recs from SN and Mr, Mcevoy stated that SN told them at her last OV that he was not seeing any sick pts and that if they dont have an appt he will not be seeing pts.  He stated that he will call and speak with primary care on the first floor on Monday and see if they can get an appt set up with a new primary care doctor.  He would like me to ask  SN if this is true or can they stay with SN until he does decide to retire.   SN please advise. Thanks  Allergies  Allergen Reactions  . Alendronate Sodium     REACTION: pt states INTOL to Fosamax  . Avapro (Irbesartan)   . Verapamil     REACTION: Intol w/ bradycardia

## 2012-06-17 NOTE — Telephone Encounter (Signed)
I spoke with the pt and pt spouse and they state that they want Dr. Kriste Basque to refer them to primary doctor on the first floor. They want to stay with this location if anyway possible and they want Dr. Jodelle Green recommendation on who to see. Please advise. Carron Curie, CMA

## 2012-06-21 ENCOUNTER — Telehealth: Payer: Self-pay | Admitting: Internal Medicine

## 2012-06-21 NOTE — Telephone Encounter (Signed)
Left message for patient to call back  

## 2012-06-21 NOTE — Telephone Encounter (Signed)
Patient is scheduled to see Mike Gip PA on 06/23/12 2:00

## 2012-06-22 ENCOUNTER — Telehealth: Payer: Self-pay | Admitting: Internal Medicine

## 2012-06-22 ENCOUNTER — Other Ambulatory Visit: Payer: Self-pay | Admitting: *Deleted

## 2012-06-22 ENCOUNTER — Encounter: Payer: Self-pay | Admitting: Neurology

## 2012-06-22 ENCOUNTER — Ambulatory Visit: Payer: Medicare Other | Admitting: Neurology

## 2012-06-22 ENCOUNTER — Telehealth: Payer: Self-pay | Admitting: Pulmonary Disease

## 2012-06-22 DIAGNOSIS — Z85820 Personal history of malignant melanoma of skin: Secondary | ICD-10-CM

## 2012-06-22 DIAGNOSIS — J45909 Unspecified asthma, uncomplicated: Secondary | ICD-10-CM

## 2012-06-22 DIAGNOSIS — A084 Viral intestinal infection, unspecified: Secondary | ICD-10-CM

## 2012-06-22 DIAGNOSIS — E785 Hyperlipidemia, unspecified: Secondary | ICD-10-CM

## 2012-06-22 DIAGNOSIS — I341 Nonrheumatic mitral (valve) prolapse: Secondary | ICD-10-CM | POA: Insufficient documentation

## 2012-06-22 NOTE — Telephone Encounter (Signed)
I spoke with the patient herself and she wants to keep the appt for tomorrow at 2:00 with Mike Gip PA

## 2012-06-22 NOTE — Telephone Encounter (Signed)
Per SN---  No openings today with SN.  She needs to see GI , will need to see Dr. Leone Payor or PA for eval of symptoms.  thanks

## 2012-06-22 NOTE — Telephone Encounter (Signed)
Called, spoke with pt.  Reports she feels "sick," has nausea, no appetite, can't eat, has a HA, and dizziness when getting up and off and on during the day x 5 wks.  Did have vomiting but has now resolved.  No diarrhea.  Temp of 99.9 off an on x 4 wks.  Feels she is worse since seeing SN on 06/14/12.  Taking zofran with relief from nausea.  Requesting OV with SN TODAY.  SN, pls advise if pt can be worked in today.  Thank you.  Pls try calling # above when calling back.  If no answer, pls call [phone].  Allergies  Allergen Reactions  . Alendronate Sodium     REACTION: pt states INTOL to Fosamax  . Avapro (Irbesartan)   . Verapamil     REACTION: Intol w/ bradycardia

## 2012-06-22 NOTE — Telephone Encounter (Signed)
Attempted to reach the patient and they did not answer, there was no machine.  The patient was offered an appt yesterday for the PA today they declined due to other appts.  If they need scheduled with the MD for follow up that will be taken care of at the PA appt.

## 2012-06-22 NOTE — Telephone Encounter (Signed)
I spoke with spouse and he is aware of SN recs. He stated GI called and pt has an appt with the PA tomorrow at 2:00. He states he is not happy with this but he guess this will have to do since they can't get in the with doctor for 3 weeks from now. Nothing further was needed

## 2012-06-22 NOTE — Telephone Encounter (Signed)
I have left a message for the patient to call back  

## 2012-06-23 ENCOUNTER — Encounter: Payer: Self-pay | Admitting: Neurology

## 2012-06-23 ENCOUNTER — Encounter: Payer: Self-pay | Admitting: Physician Assistant

## 2012-06-23 ENCOUNTER — Ambulatory Visit (INDEPENDENT_AMBULATORY_CARE_PROVIDER_SITE_OTHER): Payer: Medicare Other | Admitting: Neurology

## 2012-06-23 ENCOUNTER — Ambulatory Visit (INDEPENDENT_AMBULATORY_CARE_PROVIDER_SITE_OTHER): Payer: Medicare Other | Admitting: Physician Assistant

## 2012-06-23 ENCOUNTER — Ambulatory Visit (INDEPENDENT_AMBULATORY_CARE_PROVIDER_SITE_OTHER)
Admission: RE | Admit: 2012-06-23 | Discharge: 2012-06-23 | Disposition: A | Discharge status: Home / Self Care | Payer: Medicare Other | Source: Ambulatory Visit | Attending: Physician Assistant | Admitting: Physician Assistant

## 2012-06-23 VITALS — BP 111/71 | HR 71 | Ht 64.0 in | Wt 113.0 lb

## 2012-06-23 VITALS — BP 122/80 | HR 72 | Ht 64.0 in | Wt 114.0 lb

## 2012-06-23 DIAGNOSIS — R110 Nausea: Secondary | ICD-10-CM

## 2012-06-23 DIAGNOSIS — R634 Abnormal weight loss: Secondary | ICD-10-CM

## 2012-06-23 DIAGNOSIS — R51 Headache: Secondary | ICD-10-CM

## 2012-06-23 DIAGNOSIS — R05 Cough: Secondary | ICD-10-CM

## 2012-06-23 MED ORDER — GABAPENTIN 300 MG PO CAPS: 300.0000 mg | ORAL_CAPSULE | Freq: Three times a day (TID) | ORAL | Status: DC

## 2012-06-23 MED ORDER — ONDANSETRON HCL 4 MG PO TABS: ORAL_TABLET | ORAL | Status: DC

## 2012-06-23 MED ORDER — FLUCONAZOLE 100 MG PO TABS: 100.0000 mg | ORAL_TABLET | Freq: Every day | ORAL | Status: DC

## 2012-06-23 NOTE — Progress Notes (Signed)
Chloe Perkins is a 67 years old right-handed Caucasian female, accompanied by her husband, referred by her primary care physician Dr.Nadel and ENT Dr. Jenne Campus for evaluation of right facial pain.  She had past medical history of hypertension,, anxiety about 5 weeks ago, she had fairly acute onset right-sided facial pain, she noticed that when she lying on her right ear, a streak of sharp pain running down from her right frontal, temporal region to her right ear, also to her right jaw, it was constant achy pain, 5 out of 10, sometimes it will exacerbate it to a much more severe 9 out of 10 pain, she has been taking Motrin, which has helped some,  Concurrent with her right ear pain, she also complains of coughing, upset stomach, poor appetite, she has lost 7 pounds over past 5 weeks,.  She denies a previous history of similar headaches or right-sided hemicranial pain  She was evaluated by Dr. Jenne Campus, ENT physician, nasopharyngeal scope, laryngeal scope examination was essentially normal, nasopharynx was clear, Eustachian tube was normal, audiogram showed mild asymmetry hearing loss in the right ear compared to the left, he was at 30 decimal 76%on the right ear, 10 and 92% on the left side,  CT head without contrast was normal, MRI of the brain with and without contrast April 16th 2014, showed mild atrophy, mild chronic microvascular ischemic disease, no other abnormality,   Review of Systems  Out of a complete 14 system review, the patient complains of only the following symptoms, and all other reviewed systems are negative.   Constitutional:   Fevers/chills Cardiovascular: Palpitation Ear/Nose/Throat:  Hearing loss, ringing in ears Skin: N/A Eyes: N/A Respiratory: Cough Gastroitestinal: Constipation    Hematology/Lymphatic:  N/A Endocrine:  N/A Musculoskeletal:N/A Allergy/Immunology: N/A Neurological: Weakness, dizziness. Psychiatric:    Anxiety  PHYSICAL EXAMINATOINS:  Generalized: In  no acute distress  Neck: Supple, no carotid bruits   Cardiac: Regular rate rhythm  Pulmonary: Clear to auscultation bilaterally  Musculoskeletal: No deformity  Neurological examination  Mentation: Alert oriented to time, place, history taking, and causual conversation, very tired looking  Cranial nerve II-XII: Pupils were equal round reactive to light extraocular movements were full, visual field were full on confrontational test. facial sensation and strength were normal. hearing was intact to finger rubbing bilaterally. Uvula tongue midline.  head turning and shoulder shrug and were normal and symmetric.Tongue protrusion into cheek strength was normal.  Motor: normal tone, bulk and strength.  Sensory: Intact to fine touch, pinprick, preserved vibratory sensation, and proprioception at toes.  Coordination: Normal finger to nose, heel-to-shin bilaterally there was no truncal ataxia  Gait: Rising up from seated position without assistance, normal stance, without trunk ataxia, moderate stride, good arm swing, smooth turning, able to perform tiptoe, and heel walking without difficulty.  She had mild difficulty with tandem walking  Romberg signs: Negative  Deep tendon reflexes: Brachioradialis 2/2, biceps 2/2, triceps 2/2, patellar 2/2, Achilles 2/2, plantar responses were flexor bilaterally.  Assessment and plan: 67 years old right-handed Caucasian female, with 5 weeks history of right side pain, constant, essentially normal MRI of the brain,  1, differentiation diagnosis including migraine variants, vs. hemicranial headaches  2, gabapentin 300 mg 3 times a day  3, return to clinic in one to 2 months

## 2012-06-23 NOTE — Patient Instructions (Addendum)
Go to our Radiology department for a chest x-ray. ( basement level) We sent a refill for Zofran and a prescription for Diflucan 100 mg  To [facility], [place]. Try to eat 2 [place] yogarts a day.  [facility], [address] : Number is [phone].

## 2012-06-23 NOTE — Progress Notes (Signed)
Subjective:    Patient ID: Chloe Perkins, female    DOB: 1945-09-24, 67 y.o.   MRN: [id number]  HPI  Seva is a pleasant 75 -year-old female known to Dr. Leone Payor . She has history of IBS, was last seen in 2012 and had a normal colonoscopy done in 2006. Comes in today as an acute work in with complaints of nausea and anorexia.. She states she's been sick for about 6 weeks and had onset of symptoms with cough congestion and pain in her right E. or which she says was severe. She was initially seen by Tammy. Was given a course of antibiotics but the symptoms persisted and patient was seen in the emergency room. At that time she had MRI of her brain which was negative. She relates that she did have some low-grade fevers at the onset of her illness and occasionally still has a temp of 99 range. She was sent to ENT for evaluation. By patient report is not felt to have an ear was started on a course of steroids and Omnicef which she is supposed to take for 10 days. She says she has absolutely no appetite that food has no taste and actually tastes bad that she's having a hard time forcing herself to eat. Her husband is concerned because she is lost about 6 pounds. She says actually her facial pain in your pain is improving but she has a persistent cough which is productive of white yellow sputum. She was seen by neurology earlier today because of the headache and facial pain and was started on a trial of gabapentin. Sedimentation rate and CRP were ordered and are pending . She says she's been nauseated the entire time that she's been on antibiotics, is tired of taking so many medicines and just needs to feel better. She has Zofran to use at home and has been using this fairly regularly and says that it does help. She and her husband expressed frustration because they seen multiple blockers and still do not have a definite diagnosis for her illness..    Review of Systems  Constitutional: Positive for  activity change, appetite change, fatigue and unexpected weight change.  HENT: Positive for hearing loss and ear pain.   Eyes: Negative.   Cardiovascular: Negative.   Gastrointestinal: Positive for nausea.  Endocrine: Negative.   Genitourinary: Negative.   Musculoskeletal: Negative.   Skin: Negative.   Allergic/Immunologic: Negative.   Neurological: Positive for headaches.  Hematological: Negative.   Psychiatric/Behavioral: Negative.    Outpatient Prescriptions Prior to Visit  Medication Sig Dispense Refill  . buPROPion (WELLBUTRIN XL) 300 MG 24 hr tablet Take 300 mg by mouth daily.      . calcium-vitamin D (OSCAL WITH D) 500-200 MG-UNIT per tablet Take 1 tablet by mouth 2 (two) times daily.        . cefdinir (OMNICEF) 300 MG capsule       . diltiazem (CARDIZEM CD) 120 MG 24 hr capsule Take 120 mg by mouth daily.        Marland Kitchen LORazepam (ATIVAN) 0.5 MG tablet Take 0.5 mg by mouth 2 (two) times daily.       . valsartan (DIOVAN) 80 MG tablet One tablet once a day  30 tablet  5  . ondansetron (ZOFRAN) 4 MG tablet Take 1 tablet (4 mg total) by mouth every 8 (eight) hours as needed for nausea.  30 tablet  0  . gabapentin (NEURONTIN) 300 MG capsule Take 1 capsule (300 mg total)  by mouth 3 (three) times daily.  90 capsule  6   No facility-administered medications prior to visit.   Allergies  Allergen Reactions  . Alendronate Sodium     REACTION: pt states INTOL to Fosamax  . Avapro (Irbesartan)   . Verapamil     REACTION: Intol w/ bradycardia   Patient Active Problem List   Diagnosis Date Noted  . Mitral valve prolapse   . HLD (hyperlipidemia)   . Viral gastroenteritis   . History of melanoma   . Asthmatic bronchitis   . Unspecified gastritis and gastroduodenitis without mention of hemorrhage 06/14/2012  . Headache 05/23/2012  . Palpitations 11/28/2010  . Anxiety and depression 09/03/2010  . Paronychia 09/03/2010  . IBS (irritable bowel syndrome) 09/03/2010  . Decreased blood  pressure, not hypotension 09/03/2010  . Chronic diarrhea 08/27/2010  . HYPERCHOLESTEROLEMIA, BORDERLINE 02/06/2008  . HYPERTENSION 02/06/2008  . BRONCHITIS, RECURRENT 02/06/2008  . OSTEOPOROSIS 02/06/2008  . PERSONAL HISTORY OF MALIGNANT MELANOMA OF SKIN 02/06/2008   History  Substance Use Topics  . Smoking status: Former Smoker -- 0.50 packs/day for 20 years    Types: Cigarettes    Quit date: 02/09/1990  . Smokeless tobacco: Never Used  . Alcohol Use: No   family history includes Breast cancer in her cousin; COPD in her father; Emphysema in her father; Heart disease in her mother; Lung cancer in her paternal grandfather; Ovarian cancer in her paternal aunt; Rheum arthritis in her mother; and Stroke in her mother.     Objective:   Physical Exam Well-developed fatigued appearing 67 year old white female in no acute distress, accompanied by her husband blood pressure 122/80 pulse 72 height 5 foot 4 weight 114. HEENT; nontraumatic normocephalic EOMI PERRLA sclera anicteric oropharynx with coated dry tongue no obvious mucosal plaques,Neck; Supple no JVD, Cardiovascular; regular rate and rhythm with S1-S2 no murmur or gallop, Pulmonary ;patient coughing breath sounds are clear bilaterally no rhonchi or wheeze., Abdomen; soft bowel sounds are active there is no tenderness no distention no palpable mass or hepatosplenomegaly bowel sounds are active, Rectal; exam not done, Extremities; no clubbing cyanosis or edema skin warm and dry, Psych; affect flat but appropriate       Assessment & Plan:  #6 67 year old female with a 6 week history of illness with cough congestion right year pain and right facial pain and associated fatigue, anorexia weight loss and nausea. Her ear and facial pain are much improved-other symptoms persist A do not think she has a primary GI problem, but rather side effects from prolonged antibiotics causing nausea and anorexia. She also appears to have an oral thrush.   #2  persistent cough and sputum-rule out pneumonia #3 history of IBS Plan; chest x-ray PA and lateral today Refill Zofran and advise taking 4 mg one half to one hour before each meal Small frequent feedings and add pre-kilo GERD twice daily Start Diflucan 100 mg by mouth daily x7 days 1 refill If chest x-ray is negative, may be able to stop the [facility] early as this is likely causing the nausea and anorexia She will followup with Dr. Leone Payor as needed, and will need pulmonary followup as well with Dr. Kriste Basque.

## 2012-06-23 NOTE — Progress Notes (Signed)
Agree with Ms. Esterwood's assessment and plan. Donovan Gatchel E. Denis Koppel, MD, FACG   

## 2012-06-24 LAB — C-REACTIVE PROTEIN: CRP: 2 mg/L (ref 0.0–4.9)

## 2012-06-27 ENCOUNTER — Telehealth: Payer: Self-pay | Admitting: Physician Assistant

## 2012-06-27 NOTE — Telephone Encounter (Signed)
Left a message for patient to call me. 

## 2012-06-28 ENCOUNTER — Ambulatory Visit (INDEPENDENT_AMBULATORY_CARE_PROVIDER_SITE_OTHER): Payer: Medicare Other | Admitting: Adult Health

## 2012-06-28 ENCOUNTER — Other Ambulatory Visit (INDEPENDENT_AMBULATORY_CARE_PROVIDER_SITE_OTHER): Payer: Medicare Other

## 2012-06-28 ENCOUNTER — Encounter: Payer: Self-pay | Admitting: Adult Health

## 2012-06-28 VITALS — BP 130/82 | HR 84 | Temp 97.9°F | Ht 64.0 in | Wt 113.0 lb

## 2012-06-28 DIAGNOSIS — F419 Anxiety disorder, unspecified: Secondary | ICD-10-CM

## 2012-06-28 DIAGNOSIS — E785 Hyperlipidemia, unspecified: Secondary | ICD-10-CM

## 2012-06-28 DIAGNOSIS — K589 Irritable bowel syndrome without diarrhea: Secondary | ICD-10-CM

## 2012-06-28 DIAGNOSIS — F329 Major depressive disorder, single episode, unspecified: Secondary | ICD-10-CM

## 2012-06-28 DIAGNOSIS — F411 Generalized anxiety disorder: Secondary | ICD-10-CM

## 2012-06-28 DIAGNOSIS — F341 Dysthymic disorder: Secondary | ICD-10-CM

## 2012-06-28 DIAGNOSIS — J45909 Unspecified asthma, uncomplicated: Secondary | ICD-10-CM

## 2012-06-28 LAB — HEPATIC FUNCTION PANEL
ALT: 17 U/L (ref 0–35)
Albumin: 3.9 g/dL (ref 3.5–5.2)
Bilirubin, Direct: 0.1 mg/dL (ref 0.0–0.3)
Total Protein: 7 g/dL (ref 6.0–8.3)

## 2012-06-28 LAB — TSH: TSH: 2.57 u[IU]/mL (ref 0.35–5.50)

## 2012-06-28 NOTE — Patient Instructions (Addendum)
Begin ProBiotic daily -Align .  I will call with labs.  Set up office visit with Dr. Donell Beers to discuss anxiety.  Stress reducers follow up Dr. Kriste Basque  Next week and As needed   Please contact office for sooner follow up if symptoms do not improve or worsen or seek emergency care

## 2012-06-28 NOTE — Progress Notes (Signed)
Subjective:    Patient ID: Chloe Perkins, female    DOB: May 30, 1945, 67 y.o.   MRN: [id number]  HPI  67 y/o WF       She has hx of  recurrent bronchitic infections, HBP, ?MVP, Borderline cholesterol, Osteoporosis, Hx of melanoma, and psyche disorder followed by DrPlovsky (on disability for this).    She is also followed by [facility] for Cards- HBP, ?MVP, & palpit w/ prev Holter monitor that she reports as neg, but notes that she was allergic to the pads w/ blisters ("from both types").  ~  July 10, 2009:  she saw TP 29mo ago w/ URI- cough, gray mucous, drainage, wheezing, SOB> treated w/ Omnicef, Pred, Zyrtek, Hydromet (never filled this) etc... Improved & almost back to baseline then recurrent symptoms> cough, clear thick phlegm, congestion, no hemoptysis, no CP, no palpit, etc... she is very distraught over these symptoms & we discussed further eval w/ CXR (clear, NAD); and blood work (all essent WNL)... we discussed Rx w/ Depo80, Pred 20mg - 3d taper, Guaifenesin 400mg - 1-2Bid (she was intol to MucinexMax), + rest/ fluids/ Delsym/ etc >> all symptoms resolved.  ~  April 07, 2010:  27mo ROV & add-on for recurrent bronchitic symptoms> started w/ a "cold" c/o dry nonproductive cough, low grade temp to 100, SOB, chest discomfort across the costal margins from the coughing, & incr SOB "like I can't get a DB";  also notes weak & BP noted to be running low 100/64 today in office:  **we discussed further eval w/ CXR (clear, NAD); and Labs (all WNL);  Rx w/ Depo80, Pred taper, ZPak, Mucinex, Hydromet, rest, fluids, etc;  hold Diovan in light of low BP til home checks improve.    Pt saw [facility] for Cards f/u 1/12 & stable> hx palpit, norm Myoview 2005 but poor aerobic capacity noted, neg 2DEcho 10/10 w/o MVP seen (& norm LVF), subseq f/u Myoview 11/10 also reported normal...  ~  September 10, 2010:  13mo ROV & add-on to recheck BP> recent prob w/ BP that was low in DrGessner's office & her Diovan was held; then  called & we restarted 1/2 tab daily w/ home BP checks 120-140/ 62-72 range;  Recent labs reviewed> BUN & Creat were normal, not dehydrated...  We decided to keep the Diovan at 1/2 tab; she requests ROVs every three months...    She saw DrGessner 7/12 w/ diarrhea, weak, feeling "dehydrated"; had Staph infection of her thumb (paronychia) & given SeptraDS; increased anxiety w/ death in the family... He felt likely IBS & rec Align & fiber adjustment, CDiff was neg...  05/23/12  ACUTE OV  Complains of right sided headache for 2 weeks.  Has had thick mucus in throat for last 2 days .  Had dry heaves with thick mucus.  Yesterday am vomited after eating.  Has had right ear pain for 2 weeks.  Denies diarhea, f/c/s, SOB, wheezing, chest tightness, congestion, or cough.  NO visual or speech changes , no arm weakness, no syncope or dizziness.  Last seen > 1.5 year , has CPX planned in few weeks with Dr. Kriste Basque   Taking motrin As needed  For headache.  Went to UC 2 days ago, dx with Trigeminal Neuralgia. , rx naprosyn and vicodin.  Seen ENT 1 week, no ear or sinus infection found, no rx given.  Seen ortho 2 weeks with right neck strain, prednisone taper  No records available.  Does have double vision when she looks up -"  this has been like this for years "  >>MRI w/ no acute issues /mild atrophy, chronic microvascular changes>refer to neuro  Omnicef x 7 d   06/28/2012 Acute OV  Pt returns for persistent symptoms   anxiety and weakness are worse - reports no appetite and some anxiety associated w/ eating. Says nausea and constipation are improved.    Was referred to GI last week for nausea and wt loss.  Labs were unrevealing. CXR w/ no acute process.  Found to have thrush, started on Diflucan. Recommended to eat yogurt daily.  She says she has decrease nausea. Appetite is improved but still can not eat. When she goes to eat she gets very nervous and can not eat anything. She feels depressed, anxious,  tearful.  We discussed changing /adding to her Wellbutrin, she declined saying she wants to see her pyschiatrist.  She denies bloody stools, chest pain, rash, dsypnea, v/d ,  Fever, syncope. No suicidal ideations.  Using Ativan As needed  For anxiety. -some help.   She was seen 1 month ago for headache with atypical exam. Sent for MRI w/ no acute findings. She was referred to neuro, started on neurontin that she did not take. Says her symptoms resolved.   Accompanied by her husband.    Current Problems:   Hx of BRONCHITIS, RECURRENT (ICD-491.9) - she has a hx of recurrent bronchitic infections... ex-smoker, quit in the 1990's... seen for repeat exac 5/11 & 6/11 as above... ~  CXR 12/09 showed mild hyperinflation, few chr markings, NAD & stable... ~  CXR 5/11 showed old left rib fxs, clear, NAD.Marland Kitchen. ~  CXR 2/12 showed clear lungs, sl pectus deformity, NAD...  HYPERTENSION (ICD-401.9) - on DIOVAN 80mg - now down to 1/2 tab w/ good control... BP = 120/78 today and similar at home, tolerating med well and denies visual changes, CP, syncope, edema, etc... she notes occas palpit & EKG shows PAC.  MITRAL VALVE PROLAPSE, HX OF (ICD-V12.50) - followed for Cardiology by [facility]... prev intol to Verelan w/ bradycardia... she has intermittent palpit... 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 ~  last 2DEcho 7/09 showed norm LV size & function, normal valves x mild-mod TR... ~  EKG 5/10 showed NSR, rsr' V1-2, NAD... ~  she saw [facility] 10/10 w/ hx palpit/ dizzy> event monitor showed sinus brady, PAC's & short run SVT; he was concerned about poss sinus node dysfunction & continues to follow her closely... ~  2DEcho 10/10 showed normal LV w/ EF>55%, norm RV, normal valves (no MVP), etc... ~  Myoview 10/10 showed no evid scar or ischemia, normal LV & EF=73%, no changes... ~  EKG 8/11 showed NSR, rare PAC, otherw WNL...  HYPERCHOLESTEROLEMIA, BORDERLINE  (ICD-272.4) - hx mild elevation of cholesterol w/ excellent HDL numbers- on diet alone... ~  FLP 3/08 showed TChol 215, TG 77, HDL 87, LDL 102 ~  FLP 7/09 by [facility] showed TChol 219, TG 65, HDL 85, LDL 97 ~  FLP 5/10 showed TChol 193, TG 41, HDL 74, LDL 110 ~  FLP 8/11 showed TChol 194, TG 42, HDL 76, LDL 110  HEMORRHOIDS (ICD-455.6) - prev GI eval by DrSam w/ colonoscopy 3/06 showing tortuous colon but essentially WNL...  OSTEOPOROSIS (ICD-733.00) - followed by DrMeisinger for GYN... she is intol to Fosamax and was on Miacalcin nasal spray but stopped this on her own last yr... she fell w/ several "cracked ribs" in 2007- eval & rx by DrDean  for Ortho... she refuses Bisphosphonate therapy- "I can't take anything on an empty stomach- I get sick"... offered Reclast infusion but she inexplicably refuses this Rx as well... she does take calcium and MVI... ~  BMD here 5/06 showed TScores -1.18 in Spine, and -2.9 in left femoral neck. ~  labs 5/10 showed Vit D level = 48..,. rec to take OTC Vit D supplement. ~  BMD here 6/10 showed TScores -1.0 in Spine, and -2.6 in FemNecks> improved on Calcium, MVI, VitD...  PSYCHIATRIC DISORDER (ICD-300.9) - she is on disability from DrPlovsky- prev on Paxil & Alpraz, but now just on ATIVAN 0.5mg - taking 1/2 tab Bid.  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 she had Derm check by [facility] in 2005 w/ mult AK's frozen w/ liq nitrogen... she tells me that she is followed regularly by [facility] & everything has been OK.  Health Maintenance - last saw DrMeisinger for GYN in 2008- she is due now... last Mammogram at [facility] in 2008 and due now... she had colonoscopy in 2006 as above...    Past Surgical History  Procedure Laterality Date  . Endometrial ablation  1976  . Melanoma excision  1999    superficial spreading melanoma  right post shoulder  . Left thumb tendon repair  10/09  . Abdominal hysterectomy  1975  . Colonoscopy  04/15/2004    2006: Normal  . Wrist surgery Left 2011    Outpatient Encounter Prescriptions as of 06/28/2012  Medication Sig Dispense Refill  . buPROPion (WELLBUTRIN XL) 300 MG 24 hr tablet Take 300 mg by mouth daily.      . calcium-vitamin D (OSCAL WITH D) 500-200 MG-UNIT per tablet Take 1 tablet by mouth 2 (two) times daily.        Marland Kitchen diltiazem (CARDIZEM CD) 120 MG 24 hr capsule Take 120 mg by mouth daily.        . fluconazole (DIFLUCAN) 100 MG tablet Take 1 tablet (100 mg total) by mouth daily.  7 tablet  1  . LORazepam (ATIVAN) 0.5 MG tablet Take 0.5 mg by mouth 2 (two) times daily.       . ondansetron (ZOFRAN) 4 MG tablet Take 1 tab 1/2 to 1 hour before meals as needed for nausea.  30 tablet  0  . traMADol (ULTRAM) 50 MG tablet Take 1 tablet by mouth 3 (three) times daily as needed.      . valsartan (DIOVAN) 80 MG tablet One tablet once a day  30 tablet  5  . [DISCONTINUED] cefdinir (OMNICEF) 300 MG capsule        No facility-administered encounter medications on file as of 06/28/2012.    Allergies  Allergen Reactions  . Alendronate Sodium     REACTION: pt states INTOL to Fosamax  . Avapro (Irbesartan)   . Verapamil     REACTION: Intol w/ bradycardia    Current Medications, Allergies, Past Medical History, Past Surgical History, Family History, and Social History were reviewed in [employer] record.    Review of Systems       Constitutional:   No  weight loss, night sweats,   +Fevers, chills, fatigue, or  lassitude.  HEENT:   No Difficulty swallowing,  Tooth/dental problems, or  Sore throat,                No sneezing, itching,  +  ear ache, nasal congestion, post nasal drip,   CV:  No chest pain,  Orthopnea, PND, swelling in lower extremities, anasarca, dizziness, palpitations, syncope.   GI  No heartburn, indigestion, abdominal pain, nausea,  vomiting, diarrhea, change in bowel habits, loss of appetite, bloody stools.   Resp:    No coughing up of blood.   Marland Kitchen  No chest wall deformity  Skin: no rash or lesions.  GU: no dysuria, change in color of urine, no urgency or frequency.  No flank pain, no hematuria   MS:  Right sided neck and shoulder pain.  Psych:  No change in mood or affect. No depression or anxiety.  No memory loss.       Objective:   Physical Exam     WD, WN, 67y/o WF in NAD.  GENERAL:  Alert & oriented; pleasant & cooperative... HEENT:  [place]/AT,  PERRLA, EACs-clear, TMs nml NECK:  Supple w/ fairROM; no JVD; normal carotid impulses w/o bruits; no thyromegaly or nodules palpated; no lymphadenopathy. CHEST:  Clear to P & A; without wheezes or rales.  HEART:  Regular Rhythm; without murmurs/ rubs/ or gallops detected... ABDOMEN:  Soft & nontender; normal bowel sounds; no organomegaly or masses palpated...no guading or rebound, neg CVA tenderness  EXT: without deformities,  mild arthritic changes in hands ; no varicose veins/ venous insuffic/ or edema.  NEURO:  no focal neuro deficits... DERM:  No lesions noted; no rash etc...   Assessment & Plan:

## 2012-06-28 NOTE — Telephone Encounter (Signed)
Left a message for patient to call me. 

## 2012-06-28 NOTE — Assessment & Plan Note (Addendum)
?   IBS flare  Check labs w/ LFT and TSH   Plan   Begin ProBiotic daily -Align .

## 2012-06-28 NOTE — Assessment & Plan Note (Signed)
Resolved

## 2012-06-28 NOTE — Assessment & Plan Note (Signed)
Worsening symptoms w/ associated anorexia/wt loss  Recommended to begin Zoloft daily  She declined -wants to be seen by Psych  Referral sent for asap ov with Dr. Donell Beers her Psych MD

## 2012-06-29 NOTE — Telephone Encounter (Signed)
Left a message for patient to call me. 

## 2012-06-30 ENCOUNTER — Telehealth: Payer: Self-pay | Admitting: Adult Health

## 2012-06-30 NOTE — Telephone Encounter (Signed)
Patient did not return my calls.

## 2012-06-30 NOTE — Telephone Encounter (Signed)
Notes Recorded by Julio Sicks, NP on 06/28/2012 at 5:34 PM Labs are nml  Nothing to explain her symptoms  Cont w/ ov recs  Please contact office for sooner follow up if symptoms do not improve or worsen or seek emergency care  ---  I spoke with patient about results and she verbalized understanding and had no questions.

## 2012-07-07 ENCOUNTER — Encounter: Payer: Self-pay | Admitting: Pulmonary Disease

## 2012-07-07 ENCOUNTER — Ambulatory Visit (INDEPENDENT_AMBULATORY_CARE_PROVIDER_SITE_OTHER): Payer: Medicare Other | Admitting: Pulmonary Disease

## 2012-07-07 VITALS — BP 120/70 | HR 59 | Temp 98.7°F | Ht 64.0 in | Wt 111.0 lb

## 2012-07-07 DIAGNOSIS — E785 Hyperlipidemia, unspecified: Secondary | ICD-10-CM

## 2012-07-07 DIAGNOSIS — I10 Essential (primary) hypertension: Secondary | ICD-10-CM

## 2012-07-07 DIAGNOSIS — R197 Diarrhea, unspecified: Secondary | ICD-10-CM

## 2012-07-07 DIAGNOSIS — K529 Noninfective gastroenteritis and colitis, unspecified: Secondary | ICD-10-CM

## 2012-07-07 DIAGNOSIS — F341 Dysthymic disorder: Secondary | ICD-10-CM

## 2012-07-07 DIAGNOSIS — I341 Nonrheumatic mitral (valve) prolapse: Secondary | ICD-10-CM

## 2012-07-07 DIAGNOSIS — Z85820 Personal history of malignant melanoma of skin: Secondary | ICD-10-CM

## 2012-07-07 DIAGNOSIS — M810 Age-related osteoporosis without current pathological fracture: Secondary | ICD-10-CM

## 2012-07-07 DIAGNOSIS — I059 Rheumatic mitral valve disease, unspecified: Secondary | ICD-10-CM

## 2012-07-07 DIAGNOSIS — F419 Anxiety disorder, unspecified: Secondary | ICD-10-CM

## 2012-07-07 DIAGNOSIS — K589 Irritable bowel syndrome without diarrhea: Secondary | ICD-10-CM

## 2012-07-07 NOTE — Progress Notes (Signed)
Subjective:     Patient ID: Chloe Perkins, female   DOB: 10-15-1945, 67 y.o.   MRN: [id number]  HPI 67 y/o WF here for a follow up visit...  She has hx of recurrent bronchitic infections, HBP, ?MVP, Borderline cholesterol, Osteoporosis, Hx of melanoma, and psyche disorder followed by DrPlovsky (on disability for this).  She is also followed by [facility] for Cards- HBP, ?MVP, & palpit w/ prev Holter monitor that she reports as neg, but notes that she was allergic to the pads w/ blisters ("from both types").   ~ April 07, 2010: 32mo ROV & add-on for recurrent bronchitic symptoms> started w/ a "cold" c/o dry nonproductive cough, low grade temp to 100, SOB, chest discomfort across the costal margins from the coughing, & incr SOB "like I can't get a DB"; also notes weak & BP noted to be running low 100/64 today in office: **we discussed further eval w/ CXR (clear, NAD); and Labs (all WNL); Rx w/ Depo80, Pred taper, ZPak, Mucinex, Hydromet, rest, fluids, etc; hold Diovan in light of low BP til home checks improve.  Pt saw [facility] for Cards f/u 1/12 & stable> hx palpit, norm Myoview 2005 but poor aerobic capacity noted, neg 2DEcho 10/10 w/o MVP seen (& norm LVF), subseq f/u Myoview 11/10 also reported normal...   ~ September 10, 2010: 47mo ROV & add-on to recheck BP> recent prob w/ BP that was low in DrGessner's office & her Diovan was held; then called & we restarted 1/2 tab daily w/ home BP checks 120-140/ 62-72 range; Recent labs reviewed> BUN & Creat were normal, not dehydrated... We decided to keep the Diovan at 1/2 tab; she requests ROVs every three months...  She saw DrGessner 7/12 w/ diarrhea, weak, feeling "dehydrated"; had Staph infection of her thumb (paronychia) & given SeptraDS; increased anxiety w/ death in the family... He felt likely IBS & rec Align & fiber adjustment, CDiff was neg...   ~ Jun 14, 2012: 21 month ROV & add-on for mult symptoms> she appears quite agitated & c/o trouble w/ her BP  measuring 160+ recently, notes dizzy & nauseated; Urgent care & ER visits 4/14 w/ right earache x2wks, right facial pain & tender on exam; Labs showed Sed=31, MRI showed mild atrophy & sm vessel dis, NAD; thought Trigem Neuralgia & given Vicodin/ Naprosyn; given Omnicef & she went to see ENT in [place]- ?TA & given Pred; She notes HA improved w/ this Rx but not sure what helped the most... We reviewed the following medical problems during today's office visit >>     Hx recurrent bronchitis> prev visits in 2011 & 2012 for refractory episodes, doing satis since then w/o cough, sput, SOB, etc...     HBP> on DiltiazemCD120, Diovan80; BP= 142/76 but she is quite agitated about BP up to 160+ at ENT office recently; we discussed how pain can affect BP etc but she wants additional rx- we decided on Atenolol25mg /s & ROV recheck in 3 wks...     ?MVP, cardiac arrhythmias> followed by Howell Rucks, [facility]- prev Echo w/ thickened redundent MV, Holter showed SBrady/ PACs/ short run SVT; Myoview 2010 was neg/ wnl...     CHOL> on diet alone; Last FLP 8/11 showed TChol 194, TG 42, HDL 76, LDL 110; ?if it's been checked by [facility] in the interim...     GI- tortuous colon, Hems> on Zofran4 from ER for nausea; Last colon 2006 by drSam showed tortuous colon & Hems, otherw wnl....     Osteoporosis>  on Calcium; intol to Fosamax, took Miacalcin for awhile & stopped on her own; refused all other meds including Reclast IV; BMD 2010 showed TScore -2.6 in FemNecks (improved from -2.9)- she had f/u in [place] 12/13 w/ TScores -1.3 in Spine and -3.1 in left [facility]... Despite the deterioration she refuses Bisphos med rx or alternatives...     HAs> they sound mixed in etiology; CT/MRI 4/14 showed some atrophy & sm vessel ischemic dis; can't take Vicodin "it makes me sick" therefore try Tramadol...     Psychiatric disorder> on WellbutrinXL300, Ativan 0.5mg Bid; followed by DrPlovsky & she is on disability from him; she is rather agitated &  asked to call him for follow up...     Hx Melanoma> this occurred 1999 on right post shoulder (level 1 & completely excised); she continues w/ Derm check ups regularly & doing well w/o new lesions etc...  We reviewed prob list, meds, xrays and labs> see below for updates >> she received the 2013 flu vaccine 9/13...  LABS 4/14: Chems- ok/ wnl; CBC- ok/ wnl; Sed Rate= 31 (she was treated via the ER/ ENT w/ Pred taper...  CT Head & MRI Brain 4/14: Age related atrophy & mild chr microvasc ischemic changes, NAD seen...   ~  Jul 07, 2012:  [name] & follow up of mult somatic complaints...  Since she was here 3 wks ago she has had no fewer than 5 doctor visits for various issues as noted below; when we saw her 5/6 we decided to add low dose Atenolol 25mg /d for her BP but she says she couldn't tolerate it therefore stopped it & her BP has since been fine on he Diltiazem & Diovan... We also gave her Omep20 for her stomach and Tramadol50 for her pains- she tells me she is not taking the Omep20 "I didn't need it" and used the Tramadol w/ improvement so not using it now... Furthermore her dizziness & earache/ facial pain have all improved/ resolved.Marland KitchenMarland Kitchen   1) she saw DrYan, Neurology, 5/15 for the right facial pain> she thought this  Might be a migraine variant & prescribed Gabapentin300mg  Tid "it was neuralgia type stuff" she says; she never filled the Neurontin & symptoms have resolved on their own she says.Marland KitchenMarland Kitchen  2) she saw GI- [facility] on 5/15 for N, constip, unable to eat> hx IBS & normal colon in 2006, she wondered if symptoms were due to antibiotic treatment previously, with poss thrush; treated w/ Diflucan, Zofran, & Greek Yogurt daily.Marland KitchenMarland Kitchen  3) she saw TP in follow up> noted anxiety & weakness, poor appetite, CXR was clear/ NAD; she was recommended to start Zoloft but never filled the Rx=> went to see her Psychiatrist.  4) she saw DrPlovsky for increased agitation etc; "I was having a bad time w/ my nerves" he has added  KLONOPIN ODT 0.25Bid & this is helping she says.Marland KitchenMarland Kitchen  5) she saw ?DrHarris, [facility] for a UTI & was started on Septra rx...           Problem List:    Hx of BRONCHITIS, RECURRENT (ICD-491.9) - she has a hx of recurrent bronchitic infections... ex-smoker, quit in the 1990's... seen for repeat exac 5/11 & 6/11 as above...  ~ CXR 12/09 showed mild hyperinflation, few chr markings, NAD & stable...  ~ CXR 5/11 showed old left rib fxs, clear, NAD.Marland Kitchen.  ~ CXR 2/12 showed clear lungs, sl pectus deformity, NAD...   HYPERTENSION (ICD-401.9) - on DIOVAN 80mg - now down to 1/2 tab  w/ good control... BP = 120/78 today and similar at home, tolerating med well and denies visual changes, CP, syncope, edema, etc... she notes occas palpit & EKG shows PAC.  MITRAL VALVE PROLAPSE, HX OF (ICD-V12.50)  Hx PALPITATIONS >>  ~ followed for Cardiology by [facility]... prev intol to Verelan w/ bradycardia... she has intermittent palpit...  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  ~ last 2DEcho 7/09 showed norm LV size & function, normal valves x mild-mod TR...  ~ EKG 5/10 showed NSR, rsr' V1-2, NAD...  ~ she saw [facility] 10/10 w/ hx palpit/ dizzy> event monitor showed sinus brady, PAC's & short run SVT; he was concerned about poss sinus node dysfunction & continues to follow her closely...  ~ 2DEcho 10/10 showed normal LV w/ EF>55%, norm RV, normal valves (no MVP), etc...  ~ Myoview 10/10 showed no evid scar or ischemia, normal LV & EF=73%, no changes...  ~ EKG 8/11 showed NSR, rare PAC, otherw WNL.Marland Kitchen.  ~ Sent by Howell Rucks to DrTaylor for Palpit 10/12> hx documented SVT, hx brady on BBlockers in past, they tried Flecainide but she didn't continue this med...  ~ Last [facility] from [facility] was 8/13> doing satis, he didn't mention eval by DrTaylor, rec same meds &  f/u 65yr...  ~ EKG 4/14 showed NSR, rate78, rsr' & poor R progression V1-3, otherw wnl...   HYPERCHOLESTEROLEMIA,  BORDERLINE (ICD-272.4) - hx mild elevation of cholesterol w/ excellent HDL numbers- on diet alone...  ~ FLP 3/08 showed TChol 215, TG 77, HDL 87, LDL 102  ~ FLP 7/09 by [facility] showed TChol 219, TG 65, HDL 85, LDL 97  ~ FLP 5/10 showed TChol 193, TG 41, HDL 74, LDL 110  ~ FLP 8/11 showed TChol 194, TG 42, HDL 76, LDL 110  ~ FLP reported by [facility] 4/13 showed TChol 203, TG 65, HDL 88, LDL 102   HEMORRHOIDS (ICD-455.6) - prev GI eval by DrSam w/ colonoscopy 3/06 showing tortuous colon but essentially WNL...   OSTEOPOROSIS (ICD-733.00) - followed by DrMeisinger for GYN... she is intol to Fosamax and was on Miacalcin nasal spray but stopped this on her own last yr... she fell w/ several "cracked ribs" in 2007- eval & rx by DrDean for Ortho... she refuses Bisphosphonate therapy- "I can't take anything on an empty stomach- I get sick"... offered Reclast infusion but she inexplicably refuses this Rx as well... she does take calcium and MVI...  ~ BMD here 5/06 showed TScores -1.18 in Spine, and -2.9 in left femoral neck.  ~ labs 5/10 showed Vit D level = 48..,. rec to take OTC Vit D supplement.  ~ BMD here 6/10 showed TScores -1.0 in Spine, and -2.6 in FemNecks> improved on Calcium, MVI, VitD...  ~ on Calcium; intol to Fosamax, took Miacalcin for awhile & stopped on her own; refused all other meds including Reclast IV; BMD 2010 showed TScore -2.6 in FemNecks (improved from -2.9)- she had f/u in [place] 12/13 w/ TScores -1.3 in Spine and -3.1 in left [facility]... Despite the deterioration she refuses Bisphos med rx or alternatives...   PSYCHIATRIC DISORDER (ICD-300.9) - she is on disability from DrPlovsky- prev on Paxil & Alpraz, but now just on ATIVAN 0.5mg - taking 1/2 tab Bid.  ~  5/14:  She saw DrPliovsky due to incr agitation recently & he prescribed KLONOPIN ODT 0.25Bid- she feels this is helping...  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 she had Derm [facility] by [facility] in 2005 w/ mult AK's frozen w/ liq nitrogen... she tells me that she is followed regularly by [facility] & everything has been OK.  Health Maintenance - last saw DrMeisinger for GYN in 2008- she is due now... last Mammogram at [facility] in 2008 and due now... she had colonoscopy in 2006 as above...    Past Surgical History  Procedure Laterality Date  . Endometrial ablation  1976  . Melanoma excision  1999    superficial spreading melanoma right post shoulder  . Left thumb tendon repair  10/09  . Abdominal hysterectomy  1975  . Colonoscopy  04/15/2004    2006: Normal  . Wrist surgery Left 2011    Outpatient Encounter Prescriptions as of 07/07/2012  Medication Sig Dispense Refill  . buPROPion (WELLBUTRIN XL) 300 MG 24 hr tablet Take 300 mg by mouth daily.      . calcium-vitamin D (OSCAL WITH D) 500-200 MG-UNIT per tablet Take 1 tablet by mouth 2 (two) times daily.        . clonazePAM (KLONOPIN) 0.25 MG disintegrating tablet Take 0.25 mg by mouth 2 (two) times daily as needed. Per Dr. Donell Beers      . diltiazem (CARDIZEM CD) 120 MG 24 hr capsule Take 120 mg by mouth daily.        Marland Kitchen LORazepam (ATIVAN) 0.5 MG tablet Take 0.5 mg by mouth 2 (two) times daily.       . traMADol (ULTRAM) 50 MG tablet Take 1 tablet by mouth 3 (three) times daily as needed.      . valsartan (DIOVAN) 80 MG tablet One tablet once a day  30 tablet  5  . [DISCONTINUED] fluconazole (DIFLUCAN) 100 MG tablet Take 1 tablet (100 mg total) by mouth daily.  7 tablet  1  . [DISCONTINUED] ondansetron (ZOFRAN) 4 MG tablet Take 1 tab 1/2 to 1 hour before meals as needed for nausea.  30 tablet  0   No facility-administered encounter medications on file as of 07/07/2012.    Allergies  Allergen Reactions  . Alendronate Sodium     REACTION: pt states INTOL to Fosamax  . Avapro (Irbesartan)   . Verapamil     REACTION: Intol w/  bradycardia    Current Medications, Allergies, Past Medical History, Past Surgical History, Family History, and Social History were reviewed in [employer] record.   Review of Systems    See HPI - all other systems neg except as noted...  The patient complains of dyspnea on exertion, poor appetite, and muscle weakness. The patient denies fever, weight loss, weight gain, vision loss, decreased hearing, hoarseness, chest pain, syncope, peripheral edema, headaches, hemoptysis, abdominal pain, melena, hematochezia, severe indigestion/heartburn, hematuria, incontinence, suspicious skin lesions, transient blindness, difficulty walking, depression, unusual weight change, abnormal bleeding, enlarged lymph nodes, and angioedema.    Objective:   Physical Exam    WD, WN, 67 y/o WF in NAD... she is chr ill appearing...  GENERAL: Alert & oriented; pleasant & cooperative...  HEENT: [place]/AT, EOM-full, PERRLA, EACs-clear, TMs-wnl, NOSE- sl red, THROAT- sl red w/ dry MM's...  NECK: Supple w/ fairROM; no JVD; normal carotid impulses w/o bruits; no thyromegaly or nodules palpated; no lymphadenopathy.  CHEST: Clear to P & A; without wheezes or rales... few scat rhonchi noted...  HEART: Regular Rhythm; without murmurs/ rubs/ or gallops detected...  ABDOMEN: Soft &  nontender; normal bowel sounds; no organomegaly or masses palpated...  EXT: without deformities, left wrist splint, mild arthritic changes; no varicose veins/ venous insuffic/ or edema.  NEURO: no focal neuro deficits...  DERM: No lesions noted; no rash etc... scar right post shoulder from melanoma surg...   RADIOLOGY DATA:  Reviewed in the EPIC EMR & discussed w/ the patient...  LABORATORY DATA:  Reviewed in the EPIC EMR & discussed w/ the patient...   Assessment:      ?HA, Earache, Facial pain, plus n/v feeling bad agitation>> actually better now yet still agitated, hard to know what's going on => all symptoms resolved,  she indicates that the Tramadol helped...   HBP> On Diltiazem + Diovan from [facility]; says she was INTOL to [place] but couldn't say what the reaction was; BP is ok on her 2 meds...  MVP> Followed for Cards by Howell Rucks, hx palpit & dizzy- all test neg & she was reassured...   CHOL> Sl incr LDL chol on diet alone...   GI> IBS, Hems> Now on Align & adjusted fiber content, followed by DrGessner in the past & may need f/u soon...   Osteoporosis> on Calcium, MVI, VitD- she has refused all other meds/ Bisphosphonates, Reclast etc; followed by GYN in [place]...  Psyche> States she is disabled due to her nerves & followed by DrPlovsky on WellbutrinXL300 & Ativan 0.5mg  1/2 tab Bid prn; he recently started KlonopinODT 0.25Bid w/ improvement...     Plan:     Patient's Medications  New Prescriptions   No medications on file  Previous Medications   BUPROPION (WELLBUTRIN XL) 300 MG 24 HR TABLET    Take 300 mg by mouth daily.   CALCIUM-VITAMIN D (OSCAL WITH D) 500-200 MG-UNIT PER TABLET    Take 1 tablet by mouth 2 (two) times daily.     CLONAZEPAM (KLONOPIN) 0.25 MG DISINTEGRATING TABLET    Take 0.25 mg by mouth 2 (two) times daily as needed. Per Dr. Donell Beers   DILTIAZEM (CARDIZEM CD) 120 MG 24 HR CAPSULE    Take 120 mg by mouth daily.     LORAZEPAM (ATIVAN) 0.5 MG TABLET    Take 0.5 mg by mouth 2 (two) times daily.    TRAMADOL (ULTRAM) 50 MG TABLET    Take 1 tablet by mouth 3 (three) times daily as needed.   VALSARTAN (DIOVAN) 80 MG TABLET    One tablet once a day  Modified Medications   No medications on file  Discontinued Medications   FLUCONAZOLE (DIFLUCAN) 100 MG TABLET    Take 1 tablet (100 mg total) by mouth daily.   ONDANSETRON (ZOFRAN) 4 MG TABLET    Take 1 tab 1/2 to 1 hour before meals as needed for nausea.

## 2012-07-07 NOTE — Patient Instructions (Addendum)
Today we updated your med list in our EPIC system...    Continue your current medications the same...  Glad to see that things are settling down for you...  To aide in weight gain>> try eating 3 small meals daily & 3 snacks in between w/ nutritional supplement like Ensure, Sustacal, Boost, or Carnation Instant Breakfast shakes...  Let's plan a follow up visit in 3-58months to track your progress.Marland KitchenMarland Kitchen

## 2012-07-07 NOTE — Progress Notes (Addendum)
Subjective:    Patient ID: Chloe Perkins, female    DOB: November 14, 1945, 67 y.o.   MRN: [id number]  HPI 67 y/o WF here for a follow up visit...     She has hx of  recurrent bronchitic infections, HBP, ?MVP, Borderline cholesterol, Osteoporosis, Hx of melanoma, and psyche disorder followed by DrPlovsky (on disability for this).    She is also followed by [facility] for Cards- HBP, ?MVP, & palpit w/ prev Holter monitor that she reports as neg, but notes that she was allergic to the pads w/ blisters ("from both types").  ~  April 07, 2010:  12mo ROV & add-on for recurrent bronchitic symptoms> started w/ a "cold" c/o dry nonproductive cough, low grade temp to 100, SOB, chest discomfort across the costal margins from the coughing, & incr SOB "like I can't get a DB";  also notes weak & BP noted to be running low 100/64 today in office:  **we discussed further eval w/ CXR (clear, NAD); and Labs (all WNL);  Rx w/ Depo80, Pred taper, ZPak, Mucinex, Hydromet, rest, fluids, etc;  hold Diovan in light of low BP til home checks improve.    Pt saw [facility] for Cards f/u 1/12 & stable> hx palpit, norm Myoview 2005 but poor aerobic capacity noted, neg 2DEcho 10/10 w/o MVP seen (& norm LVF), subseq f/u Myoview 11/10 also reported normal...  ~  September 10, 2010:  59mo ROV & add-on to recheck BP> recent prob w/ BP that was low in DrGessner's office & her Diovan was held; then called & we restarted 1/2 tab daily w/ home BP checks 120-140/ 62-72 range;  Recent labs reviewed> BUN & Creat were normal, not dehydrated...  We decided to keep the Diovan at 1/2 tab; she requests ROVs every three months...    She saw DrGessner 7/12 w/ diarrhea, weak, feeling "dehydrated"; had Staph infection of her thumb (paronychia) & given SeptraDS; increased anxiety w/ death in the family... He felt likely IBS & rec Align & fiber adjustment, CDiff was neg...  ~  Jun 14, 2012:  21 month ROV & add-on for mult symptoms> she appears quite agitated & c/o  trouble w/ her BP measuring 160+ recently, notes dizzy & nauseated, Urgent care & ER visits 4/14 w/ right earache x2wks, right facial pain & tender on exam; Labs showed Sed=31, MRI showed mild atrophy & sm vessel dis, NAD; thought Trigem Neuralgia & given Vicodin/ Naprosyn; given Omnicef & she went to see ENT in [place]- ?TA & given Pred;  She notes HA improved w/ this Rx but not sure what helped the most...  We reviewed the following medical problems during today's office visit >>     Hx recurrent bronchitis> prev visits in 2011 & 2012 for refractory episodes, doing satis since then w/o cough, sput, SOB, etc...    HBP> on DiltiazemCD120, Diovan80;  BP= 142/76 but she is quite agitated about BP up to 160+ at ENT office recently; we discussed how pain can affect BP etc but she wants additional rx- we decided on Atenolol25mg /s & ROV recheck in 3 wks...    ?MVP, cardiac arrhythmias> followed by Howell Rucks, [facility]- prev Echo w/ thickened redundent MV, Holter showed SBrady/ PACs/ short run SVT; Myoview 2010 was neg/ wnl...     CHOL> on diet alone;  Last FLP 8/11 showed TChol 194, TG 42, HDL 76, LDL 110; ?if it's been checked by [facility] in the interim...    GI- tortuous colon, Hems> on Zofran4 from  ER for nausea;  Last colon 2006 by drSam showed tortuous colon & Hems, otherw wnl....    Osteoporosis> on Calcium; intol to Fosamax, took Miacalcin for awhile & stopped on her own; refused all other meds including Reclast IV;  BMD 2010 showed TScore -2.6 in FemNecks (improved from -2.9)- she had f/u in [place] 12/13 w/ TScores -1.3 in Spine and -3.1 in left [facility]... Despite the deterioration she refuses Bisphos med rx or alternatives...    HAs> they sound mixed in etiology; CT/MRI 4/14 showed some atrophy & sm vessel ischemic dis; can't take Vicodin "it makes me sick" therefore try Tramadol...    Psychiatric disorder> on WellbutrinXL300, Ativan 0.5mg Bid; followed by DrPlovsky & she is on disability from him; she is  rather agitated & asked to call him for follow up...    Hx Melanoma> this occurred 1999 on right post shoulder (level 1 & completely excised); she continues w/ Derm check ups regularly & doing well w/o new lesions etc...  We reviewed prob list, meds, xrays and labs> see below for updates >> she received the 2013 flu vaccine 9/13... LABS 4/14:  Chems- ok/ wnl;  CBC- ok/ wnl;  Sed Rate= 31 (she was treated via the ER/ ENT w/ Pred taper...  CT Head & MRI Brain 4/14:  Age related atrophy & mild chr microvasc ischemic changes, NAD seen...   Current Problems:   Hx of BRONCHITIS, RECURRENT (ICD-491.9) - she has a hx of recurrent bronchitic infections... ex-smoker, quit in the 1990's... seen for repeat exac 5/11 & 6/11 as above... ~  CXR 12/09 showed mild hyperinflation, few chr markings, NAD & stable... ~  CXR 5/11 showed old left rib fxs, clear, NAD.Marland Kitchen. ~  CXR 2/12 showed clear lungs, sl pectus deformity, NAD...  HYPERTENSION (ICD-401.9) - on DIOVAN 80mg - now down to 1/2 tab w/ good control... BP = 120/78 today and similar at home, tolerating med well and denies visual changes, CP, syncope, edema, etc... she notes occas palpit & EKG shows PAC.  MITRAL VALVE PROLAPSE, HX OF (ICD-V12.50)  Hx PALPITATIONS >>  ~  followed for Cardiology by [facility]... prev intol to Verelan w/ bradycardia... she has intermittent palpit... 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 ~  last 2DEcho 7/09 showed norm LV size & function, normal valves x mild-mod TR... ~  EKG 5/10 showed NSR, rsr' V1-2, NAD... ~  she saw [facility] 10/10 w/ hx palpit/ dizzy> event monitor showed sinus brady, PAC's & short run SVT; he was concerned about poss sinus node dysfunction & continues to follow her closely... ~  2DEcho 10/10 showed normal LV w/ EF>55%, norm RV, normal valves (no MVP), etc... ~  Myoview 10/10 showed no evid scar or ischemia, normal LV & EF=73%, no changes... ~  EKG 8/11 showed  NSR, rare PAC, otherw WNL.Marland Kitchen. ~  Sent by Howell Rucks to DrTaylor for Palpit 10/12> hx documented SVT, hx brady on BBlockers in past, they tried Flecainide but she didn't continue this med...  ~  Last note from [facility] was 8/13> doing satis, he didn't mention eval by DrTaylor, rec same meds &  f/u 56yr... ~  EKG 4/14 showed NSR, rate78, rsr' & poor R progression V1-3, otherw wnl...  HYPERCHOLESTEROLEMIA, BORDERLINE (ICD-272.4) - hx mild elevation of cholesterol w/ excellent HDL numbers- on diet alone... ~  FLP 3/08 showed TChol 215, TG 77, HDL 87, LDL 102 ~  FLP 7/09 by DrKelly showed TChol 219, TG 65, HDL  85, LDL 97 ~  FLP 5/10 showed TChol 193, TG 41, HDL 74, LDL 110 ~  FLP 8/11 showed TChol 194, TG 42, HDL 76, LDL 110 ~  FLP reported by [facility] 4/13 showed TChol 203, TG 65, HDL 88, LDL 102  HEMORRHOIDS (ICD-455.6) - prev GI eval by DrSam w/ colonoscopy 3/06 showing tortuous colon but essentially WNL...  OSTEOPOROSIS (ICD-733.00) - followed by DrMeisinger for GYN... she is intol to Fosamax and was on Miacalcin nasal spray but stopped this on her own last yr... she fell w/ several "cracked ribs" in 2007- eval & rx by DrDean for Ortho... she refuses Bisphosphonate therapy- "I can't take anything on an empty stomach- I get sick"... offered Reclast infusion but she inexplicably refuses this Rx as well... she does take calcium and MVI... ~  BMD here 5/06 showed TScores -1.18 in Spine, and -2.9 in left femoral neck. ~  labs 5/10 showed Vit D level = 48..,. rec to take OTC Vit D supplement. ~  BMD here 6/10 showed TScores -1.0 in Spine, and -2.6 in FemNecks> improved on Calcium, MVI, VitD... ~  on Calcium; intol to Fosamax, took Miacalcin for awhile & stopped on her own; refused all other meds including Reclast IV;  BMD 2010 showed TScore -2.6 in FemNecks (improved from -2.9)- she had f/u in [place] 12/13 w/ TScores -1.3 in Spine and -3.1 in left [facility]... Despite the deterioration she refuses Bisphos med  rx or alternatives...  PSYCHIATRIC DISORDER (ICD-300.9) - she is on disability from DrPlovsky- prev on Paxil & Alpraz, but now just on ATIVAN 0.5mg - taking 1/2 tab Bid.  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 she had Derm check by [facility] in 2005 w/ mult AK's frozen w/ liq nitrogen... she tells me that she is followed regularly by [facility] & everything has been OK.  Health Maintenance - last saw DrMeisinger for GYN in 2008- she is due now... last Mammogram at [facility] in 2008 and due now... she had colonoscopy in 2006 as above...    Past Surgical History  Procedure Laterality Date  . Endometrial ablation  1976  . Melanoma excision  1999    superficial spreading melanoma right post shoulder  . Left thumb tendon repair  10/09  . Abdominal hysterectomy  1975  . Colonoscopy  04/15/2004    2006: Normal  . Wrist surgery Left 2011    Outpatient Encounter Prescriptions as of 06/14/2012  Medication Sig Dispense Refill  . buPROPion (WELLBUTRIN XL) 300 MG 24 hr tablet Take 300 mg by mouth daily.      . calcium-vitamin D (OSCAL WITH D) 500-200 MG-UNIT per tablet Take 1 tablet by mouth 2 (two) times daily.        Marland Kitchen diltiazem (CARDIZEM CD) 120 MG 24 hr capsule Take 120 mg by mouth daily.        Marland Kitchen LORazepam (ATIVAN) 0.5 MG tablet Take 0.5 mg by mouth 2 (two) times daily.       . valsartan (DIOVAN) 80 MG tablet One tablet once a day  30 tablet  5  . [DISCONTINUED] ondansetron (ZOFRAN) 4 MG tablet Take 1 tablet (4 mg total) by mouth every 8 (eight) hours as needed for nausea.  30 tablet  0  . [DISCONTINUED] atenolol (TENORMIN) 25 MG tablet Take 1 tablet (25 mg total) by mouth daily.  30 tablet  11  . [  DISCONTINUED] omeprazole (PRILOSEC) 20 MG capsule Take 1 capsule (20 mg total) by mouth daily. 30 minutes before the first meal of the day  30 capsule  11  .  [DISCONTINUED] traMADol (ULTRAM) 50 MG tablet Take 1 tablet (50 mg total) by mouth 3 (three) times daily as needed for pain.  90 tablet  5   No facility-administered encounter medications on file as of 06/14/2012.    Allergies  Allergen Reactions  . Alendronate Sodium     REACTION: pt states INTOL to Fosamax  . Avapro (Irbesartan)   . Verapamil     REACTION: Intol w/ bradycardia    Current Medications, Allergies, Past Medical History, Past Surgical History, Family History, and Social History were reviewed in [employer] record.    Review of Systems      See HPI - all other systems neg except as noted...  The patient complains of dyspnea on exertion, poor appetite, and muscle weakness.  The patient denies fever, weight loss, weight gain, vision loss, decreased hearing, hoarseness, chest pain, syncope, peripheral edema, headaches, hemoptysis, abdominal pain, melena, hematochezia, severe indigestion/heartburn, hematuria, incontinence, suspicious skin lesions, transient blindness, difficulty walking, depression, unusual weight change, abnormal bleeding, enlarged lymph nodes, and angioedema.     Objective:   Physical Exam    WD, WN, 67 y/o WF in NAD... she is chr ill appearing... GENERAL:  Alert & oriented; pleasant & cooperative... HEENT:  [place]/AT, EOM-full, PERRLA, EACs-clear, TMs-wnl, NOSE- sl red, THROAT- sl red w/ dry MM's... NECK:  Supple w/ fairROM; no JVD; normal carotid impulses w/o bruits; no thyromegaly or nodules palpated; no lymphadenopathy. CHEST:  Clear to P & A; without wheezes or rales... few scat rhonchi noted... HEART:  Regular Rhythm; without murmurs/ rubs/ or gallops detected... ABDOMEN:  Soft & nontender; normal bowel sounds; no organomegaly or masses palpated... EXT: without deformities, left wrist splint, mild arthritic changes; no varicose veins/ venous insuffic/ or edema. NEURO:  no focal neuro deficits... DERM:  No lesions noted; no rash  etc... scar right post shoulder from melanoma surg...   Assessment & Plan:    ?HA, Earache, Facial pain, plus n/v feeling bad agitation>> actually better now yet still agitated, hard to know what's going on, c/o elev BP etc; we discussed adding Prilosec20 for the acid/ n/v + Atenolol25 for the BP + Tramadol50 for the pain w/ ROV in 3 wks recheck...    HBP>  On diltiazem + Diovan from [facility]; we will add Aten25 & monitor BP response...   MVP>  Followed for Cards by Howell Rucks, hx palpit & dizzy- all test neg & she was reassured...  CHOL>  Sl incr LDL chol on diet alone...  GI>  IBS, Hems>  Now on Align & adjusted fiber content, followed by DrGessner in the past & may need f/u soon...  Osteoporosis> on Calcium, MVI, VitD- she has refused all other meds/ Bisphosphonates, Reclast etc; followed by DrMeisinger for GYN...  Psyche>  States she is disabled due to her nerves & followed by DrPlovsky on WellbutrinXL300 & Ativan 0.5mg  1/2 tab Bid at present.Marland KitchenMarland Kitchen

## 2012-07-29 ENCOUNTER — Ambulatory Visit: Payer: Medicare Other | Admitting: Neurology

## 2012-08-31 ENCOUNTER — Other Ambulatory Visit: Payer: Self-pay | Admitting: Cardiovascular Disease

## 2012-08-31 MED ORDER — VALSARTAN 80 MG PO TABS: ORAL_TABLET | ORAL | Status: DC

## 2012-08-31 NOTE — Telephone Encounter (Signed)
Need refill on her Diovan 80mg  #30

## 2012-08-31 NOTE — Telephone Encounter (Signed)
Refills sent to pharmacy. 

## 2012-09-01 ENCOUNTER — Other Ambulatory Visit: Payer: Self-pay | Admitting: *Deleted

## 2012-09-02 ENCOUNTER — Encounter: Payer: Self-pay | Admitting: Cardiovascular Disease

## 2012-10-18 ENCOUNTER — Ambulatory Visit (INDEPENDENT_AMBULATORY_CARE_PROVIDER_SITE_OTHER): Payer: Medicare Other | Admitting: Pulmonary Disease

## 2012-10-18 ENCOUNTER — Encounter: Payer: Self-pay | Admitting: Pulmonary Disease

## 2012-10-18 VITALS — BP 100/76 | HR 50 | Temp 97.8°F | Ht 63.0 in | Wt 111.2 lb

## 2012-10-18 DIAGNOSIS — F329 Major depressive disorder, single episode, unspecified: Secondary | ICD-10-CM

## 2012-10-18 DIAGNOSIS — I059 Rheumatic mitral valve disease, unspecified: Secondary | ICD-10-CM

## 2012-10-18 DIAGNOSIS — I341 Nonrheumatic mitral (valve) prolapse: Secondary | ICD-10-CM

## 2012-10-18 DIAGNOSIS — F341 Dysthymic disorder: Secondary | ICD-10-CM

## 2012-10-18 DIAGNOSIS — E785 Hyperlipidemia, unspecified: Secondary | ICD-10-CM

## 2012-10-18 DIAGNOSIS — F419 Anxiety disorder, unspecified: Secondary | ICD-10-CM

## 2012-10-18 DIAGNOSIS — M810 Age-related osteoporosis without current pathological fracture: Secondary | ICD-10-CM

## 2012-10-18 DIAGNOSIS — I10 Essential (primary) hypertension: Secondary | ICD-10-CM

## 2012-10-18 DIAGNOSIS — K589 Irritable bowel syndrome without diarrhea: Secondary | ICD-10-CM

## 2012-10-18 NOTE — Patient Instructions (Addendum)
Today we updated your med list in our EPIC system...    Continue your current medications the same...  Call for any questions...  Let's plan a follow up visit in 4-81mo w/ FASTING blood work, sooner if needed for problems.Marland KitchenMarland Kitchen

## 2012-10-18 NOTE — Progress Notes (Signed)
Subjective:     Patient ID: Chloe Perkins, female   DOB: [birth year], 67 y.o.   MRN: [id number]  HPI 67 y/o WF here for a follow up visit...  She has hx of recurrent bronchitic infections, HBP, ?MVP, Borderline cholesterol, Osteoporosis, Hx of melanoma, and psyche disorder followed by DrPlovsky (on disability for this).  She is also followed by [facility] for Cards- HBP, ?MVP, & palpit w/ prev Holter monitor that she reports as neg, but notes that she was allergic to the pads w/ blisters ("from both types").   ~ April 07, 2010: 73mo ROV & add-on for recurrent bronchitic symptoms> started w/ a "cold" c/o dry nonproductive cough, low grade temp to 100, SOB, chest discomfort across the costal margins from the coughing, & incr SOB "like I can't get a DB"; also notes weak & BP noted to be running low 100/64 today in office: **we discussed further eval w/ CXR (clear, NAD); and Labs (all WNL); Rx w/ Depo80, Pred taper, ZPak, Mucinex, Hydromet, rest, fluids, etc; hold Diovan in light of low BP til home checks improve.  Pt saw [facility] for Cards f/u 1/12 & stable> hx palpit, norm Myoview 2005 but poor aerobic capacity noted, neg 2DEcho 10/10 w/o MVP seen (& norm LVF), subseq f/u Myoview 11/10 also reported normal...   ~ September 10, 2010: 20mo ROV & add-on to recheck BP> recent prob w/ BP that was low in DrGessner's office & her Diovan was held; then called & we restarted 1/2 tab daily w/ home BP checks 120-140/ 62-72 range; Recent labs reviewed> BUN & Creat were normal, not dehydrated... We decided to keep the Diovan at 1/2 tab; she requests ROVs every three months...  She saw DrGessner 7/12 w/ diarrhea, weak, feeling "dehydrated"; had Staph infection of her thumb (paronychia) & given SeptraDS; increased anxiety w/ death in the family... He felt likely IBS & rec Align & fiber adjustment, CDiff was neg...   ~ Jun 14, 2012: 21 month ROV & add-on for mult symptoms> she appears quite agitated & c/o trouble w/ her BP  measuring 160+ recently, notes dizzy & nauseated; Urgent care & ER visits 4/14 w/ right earache x2wks, right facial pain & tender on exam; Labs showed Sed=31, MRI showed mild atrophy & sm vessel dis, NAD; thought Trigem Neuralgia & given Vicodin/ Naprosyn; given Omnicef & she went to see ENT in [place]- ?TA & given Pred; She notes HA improved w/ this Rx but not sure what helped the most... We reviewed the following medical problems during today's office visit >>     Hx recurrent bronchitis> prev visits in 2011 & 2012 for refractory episodes, doing satis since then w/o cough, sput, SOB, etc...     HBP> on DiltiazemCD120, Diovan80; BP= 142/76 but she is quite agitated about BP up to 160+ at ENT office recently; we discussed how pain can affect BP etc but she wants additional rx- we decided on Atenolol25mg /s & ROV recheck in 3 wks...     ?MVP, cardiac arrhythmias> followed by Howell Rucks, [facility]- prev Echo w/ thickened redundent MV, Holter showed SBrady/ PACs/ short run SVT; Myoview 2010 was neg/ wnl...     CHOL> on diet alone; Last FLP 8/11 showed TChol 194, TG 42, HDL 76, LDL 110; ?if it's been checked by [facility] in the interim...     GI- tortuous colon, Hems> on Zofran4 from ER for nausea; Last colon 2006 by drSam showed tortuous colon & Hems, otherw wnl....     Osteoporosis>  on Calcium; intol to Fosamax, took Miacalcin for awhile & stopped on her own; refused all other meds including Reclast IV; BMD 2010 showed TScore -2.6 in FemNecks (improved from -2.9)- she had f/u in [place] 12/13 w/ TScores -1.3 in Spine and -3.1 in left [facility]... Despite the deterioration she refuses Bisphos med rx or alternatives...     HAs> they sound mixed in etiology; CT/MRI 4/14 showed some atrophy & sm vessel ischemic dis; can't take Vicodin "it makes me sick" therefore try Tramadol...     Psychiatric disorder> on WellbutrinXL300, Ativan 0.5mg Bid; followed by DrPlovsky & she is on disability from him; she is rather agitated &  asked to call him for follow up...     Hx Melanoma> this occurred 1999 on right post shoulder (level 1 & completely excised); she continues w/ Derm check ups regularly & doing well w/o new lesions etc...  We reviewed prob list, meds, xrays and labs> see below for updates >> she received the 2013 flu vaccine 9/13...  LABS 4/14: Chems- ok/ wnl; CBC- ok/ wnl; Sed Rate= 31 (she was treated via the ER/ ENT w/ Pred taper...  CT Head & MRI Brain 4/14: Age related atrophy & mild chr microvasc ischemic changes, NAD seen...   ~  Jul 07, 2012:  [name] & follow up of mult somatic complaints...  Since she was here 3 wks ago she has had no fewer than 5 doctor visits for various issues as noted below; when we saw her 5/6 we decided to add low dose Atenolol 25mg /d for her BP but she says she couldn't tolerate it therefore stopped it & her BP has since been fine on the Diltiazem & Diovan... We also gave her Omep20 for her stomach and Tramadol50 for her pains- she tells me she is not taking the Omep20 "I didn't need it" and used the Tramadol w/ improvement so not using it now... Furthermore her dizziness & earache/ facial pain have all improved/ resolved.Marland KitchenMarland Kitchen   1) she saw DrYan, Neurology, 5/15 for the right facial pain> she thought this might be a migraine variant & prescribed Gabapentin300mg  Tid "it was neuralgia type stuff" she says; she never filled the Neurontin & symptoms have resolved on their own she says.Marland KitchenMarland Kitchen  2) she saw GI- AE on 5/15 for N, constip, unable to eat> hx IBS & normal colon in 2006, she wondered if symptoms were due to antibiotic treatment previously, with poss thrush; treated w/ Diflucan, Zofran, & Greek Yogurt daily.Marland KitchenMarland Kitchen  3) she saw TP in follow up> noted anxiety & weakness, poor appetite, CXR was clear/ NAD; she was recommended to start Zoloft but never filled the Rx=> went to see her Psychiatrist.  4) she saw DrPlovsky for increased agitation etc; "I was having a bad time w/ my nerves" he has added  KLONOPIN ODT 0.25Bid & this is helping she says.Marland KitchenMarland Kitchen  5) she saw ?DrHarris, [facility] for a UTI & was started on Septra rx...   ~  October 18, 2012:  66mo ROV & Azyriah says that she is doing fine now, no new complaints or concerns, doing satis & no interval specailty visits; wt is stable at 11# for a BMI=19 & rec to add supplement daily... She notes occas palpit & is due for a f/u w/ DrKelly;  On Wellbutrin, Klonopin, & Ativan- due for a follow up w/ DrPlovsky next week "I'm on an even keel now"...  We reviewed prob list, meds, xrays and labs> see below for updates >>  states she'll get the FLu vaccine at [facility]...           Problem List:    Hx of BRONCHITIS, RECURRENT (ICD-491.9) - she has a hx of recurrent bronchitic infections... ex-smoker, quit in the 1990's... seen for repeat exac 5/11 & 6/11 as above...  ~ CXR 12/09 showed mild hyperinflation, few chr markings, NAD & stable...  ~ CXR 5/11 showed old left rib fxs, clear, NAD.Marland Kitchen.  ~ CXR 2/12 showed clear lungs, sl pectus deformity, NAD...   HYPERTENSION (ICD-401.9) - on DIOVAN 80mg - now down to 1/2 tab w/ good control... BP = 120/78 today and similar at home, tolerating med well and denies visual changes, CP, syncope, edema, etc... she notes occas palpit & EKG shows PAC.  MITRAL VALVE PROLAPSE, HX OF (ICD-V12.50)  Hx PALPITATIONS >>  ~ followed for Cardiology by [facility]... prev intol to Verelan w/ bradycardia... she has intermittent palpit...  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  ~ last 2DEcho 7/09 showed norm LV size & function, normal valves x mild-mod TR...  ~ EKG 5/10 showed NSR, rsr' V1-2, NAD...  ~ she saw [facility] 10/10 w/ hx palpit/ dizzy> event monitor showed sinus brady, PAC's & short run SVT; he was concerned about poss sinus node dysfunction & continues to follow her closely...  ~ 2DEcho 10/10 showed normal LV w/ EF>55%, norm RV, normal valves (no MVP), etc...  ~  Myoview 10/10 showed no evid scar or ischemia, normal LV & EF=73%, no changes...  ~ EKG 8/11 showed NSR, rare PAC, otherw WNL.Marland Kitchen.  ~ Sent by Howell Rucks to DrTaylor for Palpit 10/12> hx documented SVT, hx brady on BBlockers in past, they tried Flecainide but she didn't continue this med...  ~ Last note from [facility] was 8/13> doing satis, he didn't mention eval by DrTaylor, rec same meds &  f/u 21yr...  ~ EKG 4/14 showed NSR, rate78, rsr' & poor R progression V1-3, otherw wnl...   HYPERCHOLESTEROLEMIA, BORDERLINE (ICD-272.4) - hx mild elevation of cholesterol w/ excellent HDL numbers- on diet alone...  ~ FLP 3/08 showed TChol 215, TG 77, HDL 87, LDL 102  ~ FLP 7/09 by [facility] showed TChol 219, TG 65, HDL 85, LDL 97  ~ FLP 5/10 showed TChol 193, TG 41, HDL 74, LDL 110  ~ FLP 8/11 showed TChol 194, TG 42, HDL 76, LDL 110  ~ FLP reported by [facility] 4/13 showed TChol 203, TG 65, HDL 88, LDL 102   HEMORRHOIDS (ICD-455.6) - prev GI eval by DrSam w/ colonoscopy 3/06 showing tortuous colon but essentially WNL...   OSTEOPOROSIS (ICD-733.00) - followed by DrMeisinger for GYN... she is intol to Fosamax and was on Miacalcin nasal spray but stopped this on her own last yr... she fell w/ several "cracked ribs" in 2007- eval & rx by DrDean for Ortho... she refuses Bisphosphonate therapy- "I can't take anything on an empty stomach- I get sick"... offered Reclast infusion but she inexplicably refuses this Rx as well... she does take calcium and MVI...  ~ BMD here 5/06 showed TScores -1.18 in Spine, and -2.9 in left femoral neck.  ~ labs 5/10 showed Vit D level = 48..,. rec to take OTC Vit D supplement.  ~ BMD here 6/10 showed TScores -1.0 in Spine, and -2.6 in FemNecks> improved on Calcium, MVI, VitD...  ~ on Calcium; intol to Fosamax, took Miacalcin for awhile & stopped on her own; refused all other meds including Reclast IV; BMD 2010  showed TScore -2.6 in FemNecks (improved from -2.9)- she had f/u in [place] 12/13 w/  TScores -1.3 in Spine and -3.1 in left [facility]... Despite the deterioration she refuses Bisphos med rx or alternatives...   PSYCHIATRIC DISORDER (ICD-300.9) - she is on disability from DrPlovsky- prev on Paxil & Alpraz, but now just on ATIVAN 0.5mg - taking 1/2 tab Bid.  ~  5/14:  She saw DrPliovsky due to incr agitation recently & he prescribed KLONOPIN ODT 0.25Bid- she feels this is helping...  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 she had Derm check by [facility] in 2005 w/ mult AK's frozen w/ liq nitrogen... she tells me that she is followed regularly by [facility] & everything has been OK.  Health Maintenance - last saw DrMeisinger for GYN in 2008- she is due now... last Mammogram at [facility] in 2008 and due now... she had colonoscopy in 2006 as above...    Past Surgical History  Procedure Laterality Date  . Endometrial ablation  1976  . Melanoma excision  1999    superficial spreading melanoma right post shoulder  . Left thumb tendon repair  10/09  . Abdominal hysterectomy  1975  . Colonoscopy  04/15/2004    2006: Normal  . Wrist surgery Left 2011    Outpatient Encounter Prescriptions as of 10/18/2012  Medication Sig Dispense Refill  . buPROPion (WELLBUTRIN XL) 300 MG 24 hr tablet Take 300 mg by mouth daily.      . calcium-vitamin D (OSCAL WITH D) 500-200 MG-UNIT per tablet Take 1 tablet by mouth 2 (two) times daily.        . clonazePAM (KLONOPIN) 0.25 MG disintegrating tablet Take 0.25 mg by mouth 2 (two) times daily as needed. Per Dr. Donell Beers      . diltiazem (CARDIZEM CD) 120 MG 24 hr capsule Take 120 mg by mouth daily.        Marland Kitchen LORazepam (ATIVAN) 0.5 MG tablet Take 0.5 mg by mouth 2 (two) times daily.       . valsartan (DIOVAN) 80 MG tablet One tablet once a day  30 tablet  9  . [DISCONTINUED] traMADol (ULTRAM) 50 MG tablet Take 1 tablet by  mouth 3 (three) times daily as needed.       No facility-administered encounter medications on file as of 10/18/2012.    Allergies  Allergen Reactions  . Alendronate Sodium     REACTION: pt states INTOL to Fosamax  . Avapro [Irbesartan]   . Verapamil     REACTION: Intol w/ bradycardia    Current Medications, Allergies, Past Medical History, Past Surgical History, Family History, and Social History were reviewed in [employer] record.   Review of Systems    See HPI - all other systems neg except as noted...  The patient complains of dyspnea on exertion, poor appetite, and muscle weakness. The patient denies fever, weight loss, weight gain, vision loss, decreased hearing, hoarseness, chest pain, syncope, peripheral edema, headaches, hemoptysis, abdominal pain, melena, hematochezia, severe indigestion/heartburn, hematuria, incontinence, suspicious skin lesions, transient blindness, difficulty walking, depression, unusual weight change, abnormal bleeding, enlarged lymph nodes, and angioedema.    Objective:   Physical Exam    WD, WN, 67 y/o WF in NAD... she is chr ill appearing...  GENERAL: Alert & oriented; pleasant & cooperative...  HEENT: [place]/AT, EOM-full, PERRLA, EACs-clear, TMs-wnl, NOSE- sl red, THROAT-  sl red w/ dry MM's...  NECK: Supple w/ fairROM; no JVD; normal carotid impulses w/o bruits; no thyromegaly or nodules palpated; no lymphadenopathy.  CHEST: Clear to P & A; without wheezes or rales... few scat rhonchi noted...  HEART: Regular Rhythm; without murmurs/ rubs/ or gallops detected...  ABDOMEN: Soft & nontender; normal bowel sounds; no organomegaly or masses palpated...  EXT: without deformities, left wrist splint, mild arthritic changes; no varicose veins/ venous insuffic/ or edema.  NEURO: no focal neuro deficits...  DERM: No lesions noted; no rash etc... scar right post shoulder from melanoma surg...   RADIOLOGY DATA:  Reviewed in the EPIC EMR &  discussed w/ the patient...  LABORATORY DATA:  Reviewed in the EPIC EMR & discussed w/ the patient...   Assessment:      HBP> On Diltiazem + Diovan from [facility]; says she was INTOL to [place] but couldn't say what the reaction was; BP is ok on her 2 meds...  MVP> Followed for Cards by Howell Rucks, hx palpit & dizzy- all test neg & she was reassured...   CHOL> Sl incr LDL chol on diet alone...   GI> IBS, Hems> Now on Align & adjusted fiber content, followed by DrGessner in the past & may need f/u soon...   Osteoporosis> on Calcium, MVI, VitD- she has refused all other meds/ Bisphosphonates, Reclast etc; followed by GYN in [place]...  Psyche> States she is disabled due to her nerves & followed by DrPlovsky on WellbutrinXL300 & Ativan 0.5mg  1/2 tab Bid prn; he recently started KlonopinODT 0.25Bid w/ improvement...     Plan:     Patient's Medications  New Prescriptions   No medications on file  Previous Medications   BUPROPION (WELLBUTRIN XL) 300 MG 24 HR TABLET    Take 300 mg by mouth daily.   CALCIUM-VITAMIN D (OSCAL WITH D) 500-200 MG-UNIT PER TABLET    Take 1 tablet by mouth 2 (two) times daily.     CLONAZEPAM (KLONOPIN) 0.25 MG DISINTEGRATING TABLET    Take 0.25 mg by mouth 2 (two) times daily as needed. Per Dr. Donell Beers   DILTIAZEM (CARDIZEM CD) 120 MG 24 HR CAPSULE    Take 120 mg by mouth daily.     LORAZEPAM (ATIVAN) 0.5 MG TABLET    Take 0.5 mg by mouth 2 (two) times daily.    VALSARTAN (DIOVAN) 80 MG TABLET    One tablet once a day  Modified Medications   No medications on file  Discontinued Medications   TRAMADOL (ULTRAM) 50 MG TABLET    Take 1 tablet by mouth 3 (three) times daily as needed.

## 2012-11-09 ENCOUNTER — Telehealth: Payer: Self-pay | Admitting: Cardiovascular Disease

## 2012-11-09 NOTE — Telephone Encounter (Signed)
Left VM for patient to return call on 11/09/12 at 1548

## 2012-11-09 NOTE — Telephone Encounter (Signed)
Patient's insurance no longer covers Diovan.  Is it ok to switch to generic Valsartan?

## 2012-11-09 NOTE — Telephone Encounter (Signed)
Informed patient that is ok to take generic Diovan (valsartan) as insurance will no longer cover Diovan. Patient verbalized understanding.

## 2012-11-17 ENCOUNTER — Telehealth: Payer: Self-pay | Admitting: Pharmacist Clinician (PhC)/ Clinical Pharmacy Specialist

## 2012-11-17 NOTE — Telephone Encounter (Signed)
Pt was in office with question - states she was once told she had mitral valve prolapse and would always need SBE prophylaxis, but that at her last visit she was told she didn't have prolapse or need abx. Pt unsure.  Reviewed past echocardiogram reports - 2004-2012 - no reports indicate any prolapse.  Advised pt that she does not have the problem and does not need antibiotics for dental work

## 2012-12-29 ENCOUNTER — Telehealth: Payer: Self-pay | Admitting: Pulmonary Disease

## 2012-12-29 DIAGNOSIS — Z1239 Encounter for other screening for malignant neoplasm of breast: Secondary | ICD-10-CM

## 2012-12-29 NOTE — Telephone Encounter (Signed)
Order has been placed for the mammogram. i have called the pt and she is aware. Nothing further is needed.

## 2013-01-09 ENCOUNTER — Other Ambulatory Visit: Payer: Self-pay | Admitting: *Deleted

## 2013-01-09 MED ORDER — VALSARTAN 80 MG PO TABS: 80.0000 mg | ORAL_TABLET | Freq: Every day | ORAL | Status: DC

## 2013-01-10 DIAGNOSIS — C4491 Basal cell carcinoma of skin, unspecified: Secondary | ICD-10-CM

## 2013-01-10 HISTORY — DX: Basal cell carcinoma of skin, unspecified: C44.91

## 2013-01-20 ENCOUNTER — Telehealth: Payer: Self-pay | Admitting: Pharmacist Clinician (PhC)/ Clinical Pharmacy Specialist

## 2013-01-20 MED ORDER — IRBESARTAN 75 MG PO TABS: 75.0000 mg | ORAL_TABLET | Freq: Every day | ORAL | Status: DC

## 2013-01-20 NOTE — Telephone Encounter (Signed)
Received letter from insurance stating will not cover Diovan 80mg  in 2015.  Spoke with patient today, states still taking, and concerned that other meds will not work.  Wants [place] to send PA to insurance to continue coverage.  I explained that some of the alternatives that are available are very good medications and asked if she would be willing to try for a few months.  Will switch her to irbesartan 75mg  daily and gave her this info.  We spoke for about 5 minutes on the different BP meds available and I assured her that I thought there would be no problems with switching to this.  Pt stated willing to try for now.  Will continue to monitor her home BP.  Note: electronic chart lists avepro (irbesartan) as an allergy with no description of reaction.  Called pt second time to ask about this and she does not recall ever taking this med or having reaction to any cardiac med other than verapamil.  It is not listed in either electronic or office paper chart as a med.  Will send rx to pharmacy, pt aware.

## 2013-01-26 ENCOUNTER — Ambulatory Visit: Payer: Self-pay | Admitting: Pulmonary Disease

## 2013-01-26 LAB — HM MAMMOGRAPHY: HM Mammogram: NORMAL

## 2013-01-31 ENCOUNTER — Telehealth: Payer: Self-pay | Admitting: Pulmonary Disease

## 2013-01-31 NOTE — Telephone Encounter (Signed)
Recd records from [facility] Forwarding 2pgs to Dr.Nadel

## 2013-02-03 ENCOUNTER — Telehealth: Payer: Self-pay | Admitting: Pulmonary Disease

## 2013-02-03 ENCOUNTER — Other Ambulatory Visit: Payer: Self-pay | Admitting: Cardiovascular Disease

## 2013-02-03 NOTE — Telephone Encounter (Signed)
Called and spoke with pt and she stated that she had the mammogram and they told her she may need an ultrasound done.  They have forwarded the results to SN.  Pt is aware that SN is out of the office until Monday but we will call her and let her know what SN recs.

## 2013-02-06 NOTE — Telephone Encounter (Signed)
Rx was sent to pharmacy electronically. 

## 2013-02-07 NOTE — Telephone Encounter (Signed)
Called and spoke with [facility].  They will fax these results to SN to review.  They stated that the mammogram was normal and pt was not told that she may need ultrasound done.  Will wait for these results to come through and have SN review.

## 2013-02-07 NOTE — Telephone Encounter (Signed)
SN reviewed the mammogram report that was received today from [facility].   The pt will not need ultrasound due to the mammogram being normal.  Called and spoke with pt and she is aware of SN recs.  Nothing further is needed.

## 2013-02-10 ENCOUNTER — Telehealth: Payer: Self-pay | Admitting: Pulmonary Disease

## 2013-02-10 NOTE — Telephone Encounter (Signed)
Spoke with spouse-- Pt coughing with discomfort in chest and Fever 100.2 x 1-2days.  Using Hydromet cough syrup--little relief  Requesting to be seen today. Aware that SN is not seeing patients today. Allergies  Allergen Reactions  . Alendronate Sodium     REACTION: pt states INTOL to Fosamax  . Verapamil     REACTION: Intol w/ bradycardia   Please advise Dr Lenna Gilford. Thanks.

## 2013-02-10 NOTE — Telephone Encounter (Signed)
Per SN---  Will need to be seen at urgent care for eval  We can call in zpak #1  Take as directed and they can pick up rx for tussionex  #4oz  1 tsp every 12 hours prn cough Take the mucinex 2 po bid, increase fluids and tylenol.  Called and lmomtcb for the pt to make her aware

## 2013-02-20 ENCOUNTER — Telehealth: Payer: Self-pay | Admitting: Cardiovascular Disease

## 2013-02-20 MED ORDER — LOSARTAN POTASSIUM 50 MG PO TABS: 50.0000 mg | ORAL_TABLET | Freq: Every day | ORAL | Status: DC

## 2013-02-20 NOTE — Telephone Encounter (Signed)
Pt c/o irritation in stomach because irbesartan is not "coated".  States it also hurts her throat when she swallows.  Will switch her to losartan 50mg .  Explained that it is a very small tablet, although I do not how coated the tablet is.

## 2013-02-20 NOTE — Telephone Encounter (Signed)
The BP med she is now taking is not coated and is irritating her stomach.  Please call

## 2013-02-20 NOTE — Telephone Encounter (Signed)
Message forwarded to K. Alvstad, PharmD as she spoke w/ pt on 12.12.14 and switched Diovan to Avapro.

## 2013-03-02 HISTORY — PX: SKIN CANCER EXCISION: SHX779

## 2013-03-20 ENCOUNTER — Encounter: Payer: Self-pay | Admitting: Pulmonary Disease

## 2013-05-04 ENCOUNTER — Other Ambulatory Visit (INDEPENDENT_AMBULATORY_CARE_PROVIDER_SITE_OTHER): Payer: Medicare Other

## 2013-05-04 ENCOUNTER — Encounter: Payer: Self-pay | Admitting: Pulmonary Disease

## 2013-05-04 ENCOUNTER — Ambulatory Visit (INDEPENDENT_AMBULATORY_CARE_PROVIDER_SITE_OTHER): Payer: Medicare Other | Admitting: Pulmonary Disease

## 2013-05-04 ENCOUNTER — Other Ambulatory Visit: Payer: Self-pay | Admitting: Pulmonary Disease

## 2013-05-04 VITALS — BP 118/74 | HR 54 | Temp 98.0°F | Ht 63.0 in | Wt 115.2 lb

## 2013-05-04 DIAGNOSIS — R5381 Other malaise: Secondary | ICD-10-CM

## 2013-05-04 DIAGNOSIS — I10 Essential (primary) hypertension: Secondary | ICD-10-CM

## 2013-05-04 DIAGNOSIS — M810 Age-related osteoporosis without current pathological fracture: Secondary | ICD-10-CM

## 2013-05-04 DIAGNOSIS — E785 Hyperlipidemia, unspecified: Secondary | ICD-10-CM

## 2013-05-04 DIAGNOSIS — F329 Major depressive disorder, single episode, unspecified: Secondary | ICD-10-CM

## 2013-05-04 DIAGNOSIS — R5383 Other fatigue: Principal | ICD-10-CM

## 2013-05-04 DIAGNOSIS — R0600 Dyspnea, unspecified: Secondary | ICD-10-CM

## 2013-05-04 DIAGNOSIS — F419 Anxiety disorder, unspecified: Secondary | ICD-10-CM

## 2013-05-04 DIAGNOSIS — I341 Nonrheumatic mitral (valve) prolapse: Secondary | ICD-10-CM

## 2013-05-04 DIAGNOSIS — R002 Palpitations: Secondary | ICD-10-CM

## 2013-05-04 DIAGNOSIS — F32A Depression, unspecified: Secondary | ICD-10-CM

## 2013-05-04 DIAGNOSIS — F341 Dysthymic disorder: Secondary | ICD-10-CM

## 2013-05-04 DIAGNOSIS — R51 Headache: Secondary | ICD-10-CM

## 2013-05-04 DIAGNOSIS — E559 Vitamin D deficiency, unspecified: Secondary | ICD-10-CM

## 2013-05-04 DIAGNOSIS — K589 Irritable bowel syndrome without diarrhea: Secondary | ICD-10-CM

## 2013-05-04 DIAGNOSIS — Z85820 Personal history of malignant melanoma of skin: Secondary | ICD-10-CM

## 2013-05-04 DIAGNOSIS — I059 Rheumatic mitral valve disease, unspecified: Secondary | ICD-10-CM

## 2013-05-04 LAB — CBC WITH DIFFERENTIAL/PLATELET
BASOS PCT: 0.6 % (ref 0.0–3.0)
Basophils Absolute: 0 10*3/uL (ref 0.0–0.1)
EOS ABS: 0 10*3/uL (ref 0.0–0.7)
Eosinophils Relative: 0.9 % (ref 0.0–5.0)
HCT: 42.4 % (ref 36.0–46.0)
HEMOGLOBIN: 14.3 g/dL (ref 12.0–15.0)
Lymphocytes Relative: 22.1 % (ref 12.0–46.0)
Lymphs Abs: 0.8 10*3/uL (ref 0.7–4.0)
MCHC: 33.8 g/dL (ref 30.0–36.0)
MCV: 94.7 fl (ref 78.0–100.0)
MONOS PCT: 10 % (ref 3.0–12.0)
Monocytes Absolute: 0.4 10*3/uL (ref 0.1–1.0)
NEUTROS ABS: 2.4 10*3/uL (ref 1.4–7.7)
Neutrophils Relative %: 66.4 % (ref 43.0–77.0)
Platelets: 201 10*3/uL (ref 150.0–400.0)
RBC: 4.47 Mil/uL (ref 3.87–5.11)
RDW: 12.9 % (ref 11.5–14.6)
WBC: 3.7 10*3/uL — ABNORMAL LOW (ref 4.5–10.5)

## 2013-05-04 LAB — BASIC METABOLIC PANEL
BUN: 17 mg/dL (ref 6–23)
CO2: 30 meq/L (ref 19–32)
CREATININE: 1.1 mg/dL (ref 0.4–1.2)
Calcium: 9.6 mg/dL (ref 8.4–10.5)
Chloride: 106 mEq/L (ref 96–112)
GFR: 51.81 mL/min — ABNORMAL LOW (ref >60.00)
Glucose, Bld: 94 mg/dL (ref 70–99)
Potassium: 4.6 mEq/L (ref 3.5–5.1)
Sodium: 141 mEq/L (ref 135–145)

## 2013-05-04 LAB — HEPATIC FUNCTION PANEL
ALT: 15 U/L (ref 0–35)
AST: 20 U/L (ref 0–37)
Albumin: 4.1 g/dL (ref 3.5–5.2)
Alkaline Phosphatase: 74 U/L (ref 39–117)
BILIRUBIN DIRECT: 0 mg/dL (ref 0.0–0.3)
Total Bilirubin: 0.4 mg/dL (ref 0.3–1.2)
Total Protein: 6.9 g/dL (ref 6.0–8.3)

## 2013-05-04 LAB — LIPID PANEL
CHOL/HDL RATIO: 2
Cholesterol: 202 mg/dL — ABNORMAL HIGH (ref 0–200)
HDL: 83.4 mg/dL (ref >39.00)
LDL CALC: 109 mg/dL — AB (ref 0–99)
Triglycerides: 47 mg/dL (ref 0.0–149.0)
VLDL: 9.4 mg/dL (ref 0.0–40.0)

## 2013-05-04 LAB — TSH: TSH: 6.03 u[IU]/mL — ABNORMAL HIGH (ref 0.35–5.50)

## 2013-05-04 MED ORDER — DILTIAZEM HCL ER COATED BEADS 120 MG PO CP24: ORAL_CAPSULE | ORAL | Status: DC

## 2013-05-04 MED ORDER — LOSARTAN POTASSIUM 50 MG PO TABS: 50.0000 mg | ORAL_TABLET | Freq: Every day | ORAL | Status: DC

## 2013-05-04 NOTE — Patient Instructions (Signed)
Today we updated your med list in our EPIC system...    Continue your current medications the same...  We refilled the meds you requested...  Today we did your follow up FASTING blood work...    We will contact you w/ the results when available...   Call for any questions or if we can be of service in any way.Marland KitchenMarland Kitchen

## 2013-05-04 NOTE — Progress Notes (Signed)
Subjective:     Patient ID: Chloe Perkins, female   DOB: Jan 02, 1946, 68 y.o.   MRN: [id number]  HPI 68 y/o WF here for a follow up visit...  She has hx of recurrent bronchitic infections, HBP, ?MVP, Borderline cholesterol, Osteoporosis, Hx of melanoma, and psyche disorder followed by DrPlovsky (on disability for this).  She is also followed by [facility] for Cards- HBP, ?MVP, & palpit w/ prev Holter monitor that she reports as neg, but notes that she was allergic to the pads w/ blisters ("from both types").   ~ Jun 14, 2012: 21 month ROV & add-on for mult symptoms> she appears quite agitated & c/o trouble w/ her BP measuring 160+ recently, notes dizzy & nauseated; Urgent care & ER visits 4/14 w/ right earache x2wks, right facial pain & tender on exam; Labs showed Sed=31, MRI showed mild atrophy & sm vessel dis, NAD; thought Trigem Neuralgia & given Vicodin/ Naprosyn; given Omnicef & she went to see ENT in [employer]- ?TA & given Pred; She notes HA improved w/ this Rx but not sure what helped the most... We reviewed the following medical problems during today's office visit >>     Hx recurrent bronchitis> prev visits in 2011 & 2012 for refractory episodes, doing satis since then w/o cough, sput, SOB, etc...     HBP> on DiltiazemCD120, Diovan80; BP= 142/76 but she is quite agitated about BP up to 160+ at ENT office recently; we discussed how pain can affect BP etc but she wants additional rx- we decided on Atenolol25mg /s & ROV recheck in 3 wks...     ?MVP, cardiac arrhythmias> followed by Quentin Ore, [facility]- prev Echo w/ thickened redundent MV, Holter showed SBrady/ PACs/ short run SVT; Myoview 2010 was neg/ wnl...     CHOL> on diet alone; Last FLP 8/11 showed TChol 194, TG 42, HDL 76, LDL 110; ?if it's been checked by [facility] in the interim...     GI- tortuous colon, Hems> on Zofran4 from ER for nausea; Last colon 2006 by drSam showed tortuous colon & Hems, otherw wnl....     Osteoporosis> on Calcium; intol  to Fosamax, took Miacalcin for awhile & stopped on her own; refused all other meds including Reclast IV; BMD 2010 showed TScore -2.6 in FemNecks (improved from -2.9)- she had f/u in [place] 12/13 w/ TScores -1.3 in Spine and -3.1 in left [facility]... Despite the deterioration she refuses Bisphos med rx or alternatives...     HAs> they sound mixed in etiology; CT/MRI 4/14 showed some atrophy & sm vessel ischemic dis; can't take Vicodin "it makes me sick" therefore try Tramadol...     Psychiatric disorder> on WellbutrinXL300, Ativan 0.5mg Bid; followed by DrPlovsky & she is on disability from him; she is rather agitated & asked to call him for follow up...     Hx Melanoma> this occurred 1999 on right post shoulder (level 1 & completely excised); she continues w/ Derm check ups regularly & doing well w/o new lesions etc...  We reviewed prob list, meds, xrays and labs> see below for updates >> she received the 2013 flu vaccine 9/13...   LABS 4/14: Chems- ok/ wnl; CBC- ok/ wnl; Sed Rate= 31 (she was treated via the ER/ ENT w/ Pred taper...   CT Head & MRI Brain 4/14: Age related atrophy & mild chr microvasc ischemic changes, NAD seen...   ~  Jul 07, 2012:  [name] & follow up of mult somatic complaints...  Since she was here 3 wks  ago she has had no fewer than 5 doctor visits for various issues as noted below; when we saw her 5/6 we decided to add low dose Atenolol 25mg /d for her BP but she says she couldn't tolerate it therefore stopped it & her BP has since been fine on the Diltiazem & Diovan... We also gave her Omep20 for her stomach and Tramadol50 for her pains- she tells me she is not taking the Omep20 "I didn't need it" and used the Tramadol w/ improvement so not using it now... Furthermore her dizziness & earache/ facial pain have all improved/ resolved.Marland KitchenMarland Kitchen     1) she saw DrYan, Neurology, 5/15 for the right facial pain> she thought this might be a migraine variant & prescribed Gabapentin300mg  Tid "it was  neuralgia type stuff" she says; she never filled the Neurontin & symptoms have resolved on their own she says.Marland KitchenMarland Kitchen    2) she saw GI- AE on 5/15 for N, constip, unable to eat> hx IBS & normal colon in 2006, she wondered if symptoms were due to antibiotic treatment previously, with poss thrush; treated w/ Diflucan, Zofran, & Greek Yogurt daily.Marland KitchenMarland Kitchen    3) she saw TP in follow up> noted anxiety & weakness, poor appetite, CXR was clear/ NAD; she was recommended to start Zoloft but never filled the Rx=> went to see her Psychiatrist.    4) she saw DrPlovsky for increased agitation etc; "I was having a bad time w/ my nerves" he has added KLONOPIN ODT 0.25Bid & this is helping she says.Marland KitchenMarland Kitchen    5) she saw ?DrHarris, [facility] for a UTI & was started on Septra rx...   ~  October 18, 2012:  [name] & Chloe Perkins says that she is doing fine now, no new complaints or concerns, doing satis & no interval specailty visits; wt is stable at 11# for a BMI=19 & rec to add supplement daily... She notes occas palpit & is due for a f/u w/ DrKelly;  On Wellbutrin, Klonopin, & Ativan- due for a follow up w/ DrPlovsky next week "I'm on an even keel now"...  We reviewed prob list, meds, xrays and labs> see below for updates >> states she'll get the FLu vaccine at [facility]...   ~  May 04, 2013:  [name] & Chloe Perkins reports a good interval other than a skin cancer removed from her right cheek 1/15;  We reviewed the following medical problems during today's office visit >>     Hx recurrent bronchitis> prev visits in 2011 & 2012 for refractory episodes, doing satis since then w/o cough, sput, SOB, etc...     HBP> on DiltiazemCD120 & Cozaar50; BP= 118/74 & she is very pleased; denies HA, CP, palpit, SOB, edema...    ?MVP, cardiac arrhythmias> followed by Quentin Ore, [facility]- prev Echo w/ thickened redundent MV, Holter showed SBrady/ PACs/ short run SVT; Myoview 2010 was neg/ wnl...     CHOL> on diet alone; FLP 3/15 shows TChol 202, TG 47, HDL  83, LDL 109      GI- tortuous colon, Hems> Last colon 2006 by DrSam showed tortuous colon & Hems, otherw wnl....     Osteoporosis> on Calcium; intol to Fosamax, took Miacalcin for awhile & stopped on her own; refused all other meds including Reclast IV; BMD 2010 showed TScore -2.6 in FemNecks (improved from -2.9); she had f/u in [place] 12/13 w/ TScores -1.3 in Spine and -3.1 in left FemNeck... Despite the deterioration she refuses Bisphos med rx or alternatives.Marland KitchenMarland Kitchen  HAs> they sound mixed in etiology; CT/MRI 4/14 showed some atrophy & sm vessel ischemic dis; can't take Vicodin "it makes me sick" therefore try Tramadol...     Psychiatric disorder> on WellbutrinXL300, Ativan 0.5mg Bid; followed by DrPlovsky & she is on disability from him...    Hx Melanoma> this occurred 1999 on right post shoulder (level 1 & completely excised); she continues w/ Derm check ups regularly & doing well w/ basal cell removed from right cheek 1/15... We reviewed prob list, meds, xrays and labs> see below for updates >>   LABS 3/15:  FLP- not quite at goals on diet but HDL=83;  Chems- wnl;  CBC- wnl;  TSH=6.03 & she is rec to start Synthroid50 & f/u TSH in 50mo...          Problem List:    Hx of BRONCHITIS, RECURRENT (ICD-491.9) - she has a hx of recurrent bronchitic infections... ex-smoker, quit in the 1990's... seen for repeat exac 5/11 & 6/11 as above...  ~ CXR 12/09 showed mild hyperinflation, few chr markings, NAD & stable...  ~ CXR 5/11 showed old left rib fxs, clear, NAD.Marland Kitchen.  ~ CXR 2/12 showed clear lungs, sl pectus deformity, NAD...   HYPERTENSION (ICD-401.9) - on DIOVAN 80mg - now down to 1/2 tab w/ good control... BP = 120/78 today and similar at home, tolerating med well and denies visual changes, CP, syncope, edema, etc... she notes occas palpit & EKG shows PAC.  MITRAL VALVE PROLAPSE, HX OF (ICD-V12.50)  Hx PALPITATIONS >>  ~ followed for Cardiology by [facility]... prev intol to Verelan w/ bradycardia... she  has intermittent palpit...  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  ~ last 2DEcho 7/09 showed norm LV size & function, normal valves x mild-mod TR...  ~ EKG 5/10 showed NSR, rsr' V1-2, NAD...  ~ she saw [facility] 10/10 w/ hx palpit/ dizzy> event monitor showed sinus brady, PAC's & short run SVT; he was concerned about poss sinus node dysfunction & continues to follow her closely...  ~ 2DEcho 10/10 showed normal LV w/ EF>55%, norm RV, normal valves (no MVP), etc...  ~ Myoview 10/10 showed no evid scar or ischemia, normal LV & EF=73%, no changes...  ~ EKG 8/11 showed NSR, rare PAC, otherw WNL.Marland Kitchen.  ~ Sent by Quentin Ore to DrTaylor for Palpit 10/12> hx documented SVT, hx brady on BBlockers in past, they tried Flecainide but she didn't continue this med...  ~ Last note from [facility] was 8/13> doing satis, he didn't mention eval by DrTaylor, rec same meds &  f/u 41yr...  ~ EKG 4/14 showed NSR, rate78, rsr' & poor R progression V1-3, otherw wnl...  ~  3/15: on [place]; BP= 118/74 & she is very pleased; denies HA, CP, palpit, SOB, edema.  HYPERCHOLESTEROLEMIA, BORDERLINE (ICD-272.4) - hx mild elevation of cholesterol w/ excellent HDL numbers- on diet alone...  ~ [place] 3/08 showed TChol 215, TG 77, HDL 87, LDL 102  ~ FLP 7/09 by [facility] showed TChol 219, TG 65, HDL 85, LDL 97  ~ FLP 5/10 showed TChol 193, TG 41, HDL 74, LDL 110  ~ FLP 8/11 showed TChol 194, TG 42, HDL 76, LDL 110  ~ FLP reported by [facility] 4/13 showed TChol 203, TG 65, HDL 88, LDL 102  ~  FLP 3/15 on diet alone showed TChol 202, TG 47, HDL 83, LDL 109  HEMORRHOIDS (ICD-455.6) - prev GI eval by DrSam w/ colonoscopy 3/06 showing tortuous colon but  essentially WNL...   OSTEOPOROSIS (ICD-733.00) - followed by DrMeisinger for GYN... she is intol to Fosamax and was on Miacalcin nasal spray but stopped this on her own last yr... she fell w/ several "cracked ribs" in 2007- eval & rx  by DrDean for Ortho... she refuses Bisphosphonate therapy- "I can't take anything on an empty stomach- I get sick"... offered Reclast infusion but she inexplicably refuses this Rx as well... she does take calcium and MVI...  ~ BMD here 5/06 showed TScores -1.18 in Spine, and -2.9 in left femoral neck.  ~ labs 5/10 showed Vit D level = 48..,. rec to take OTC Vit D supplement.  ~ BMD here 6/10 showed TScores -1.0 in Spine, and -2.6 in FemNecks> improved on Calcium, MVI, VitD...  ~ on Calcium; intol to Fosamax, took Miacalcin for awhile & stopped on her own; refused all other meds including Reclast IV; BMD 2010 showed TScore -2.6 in FemNecks (improved from -2.9)- she had f/u in [place] 12/13 w/ TScores -1.3 in Spine and -3.1 in left [facility]... Despite the deterioration she refuses Bisphos med rx or alternatives...   PSYCHIATRIC DISORDER (ICD-300.9) - she is on disability from DrPlovsky- prev on Paxil & Alpraz, but now just on ATIVAN 0.5mg - taking 1/2 tab Bid.  ~  5/14:  She saw DrPliovsky due to incr agitation recently & he prescribed KLONOPIN ODT 0.25Bid- she feels this is helping...  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 she had Derm check by [facility] in 2005 w/ mult AK's frozen w/ liq nitrogen... she tells me that she is followed regularly by [facility] & everything has been OK.  ~  1/15: she reports basal cell removed from right cheek area by derm...  Health Maintenance - last saw DrMeisinger for GYN in 2008- she is due now... last Mamm[facility] at [facility] in 2008 and due now... she had colonoscopy in 2006 as above...    Past Surgical History  Procedure Laterality Date  . Endometrial ablation  1976  . Melanoma excision  1999    superficial spreading melanoma right post shoulder  . Left thumb tendon repair  10/09  . Abdominal hysterectomy  1975  .  Colonoscopy  04/15/2004    2006: Normal  . Wrist surgery Left 2011  . Skin cancer excision  03/02/2013    superficial basal cell cancer    Outpatient Encounter Prescriptions as of 05/04/2013  Medication Sig  . buPROPion (WELLBUTRIN XL) 300 MG 24 hr tablet Take 300 mg by mouth daily.  . calcium-vitamin D (OSCAL WITH D) 500-200 MG-UNIT per tablet Take 1 tablet by mouth 2 (two) times daily.    Marland Kitchen CARTIA XT 120 MG 24 hr capsule TAKE 1 CAPSULE BY MOUTH EVERY DAY  . LORazepam (ATIVAN) 0.5 MG tablet Take 0.5 mg by mouth 2 (two) times daily.   Marland Kitchen losartan (COZAAR) 50 MG tablet Take 1 tablet (50 mg total) by mouth daily.  . [DISCONTINUED] clonazePAM (KLONOPIN) 0.25 MG disintegrating tablet Take 0.25 mg by mouth 2 (two) times daily as needed. Per Dr. Casimiro Needle  . [DISCONTINUED] irbesartan (AVAPRO) 75 MG tablet Take 1 tablet (75 mg total) by mouth daily.  . [DISCONTINUED] valsartan (DIOVAN) 80 MG tablet Take 1 tablet (80 mg total) by mouth daily.    Allergies  Allergen Reactions  . Alendronate Sodium     REACTION: pt states INTOL to Fosamax  .  Verapamil     REACTION: Intol w/ bradycardia    Current Medications, Allergies, Past Medical History, Past Surgical History, Family History, and Social History were reviewed in [employer] record.   Review of Systems    See HPI - all other systems neg except as noted...  The patient complains of dyspnea on exertion, poor appetite, and muscle weakness. The patient denies fever, weight loss, weight gain, vision loss, decreased hearing, hoarseness, chest pain, syncope, peripheral edema, headaches, hemoptysis, abdominal pain, melena, hematochezia, severe indigestion/heartburn, hematuria, incontinence, suspicious skin lesions, transient blindness, difficulty walking, depression, unusual weight change, abnormal bleeding, enlarged lymph nodes, and angioedema.    Objective:   Physical Exam    WD, WN, 68 y/o WF in NAD... she is chr ill  appearing...  GENERAL: Alert & oriented; pleasant & cooperative...  HEENT: [place]/AT, EOM-full, PERRLA, EACs-clear, TMs-wnl, NOSE- sl red, THROAT- sl red w/ dry MM's...  NECK: Supple w/ fairROM; no JVD; normal carotid impulses w/o bruits; no thyromegaly or nodules palpated; no lymphadenopathy.  CHEST: Clear to P & A; without wheezes or rales... few scat rhonchi noted...  HEART: Regular Rhythm; without murmurs/ rubs/ or gallops detected...  ABDOMEN: Soft & nontender; normal bowel sounds; no organomegaly or masses palpated...  EXT: without deformities, left wrist splint, mild arthritic changes; no varicose veins/ venous insuffic/ or edema.  NEURO: no focal neuro deficits...  DERM: No lesions noted; no rash etc... scar right post shoulder from melanoma surg...   RADIOLOGY DATA:  Reviewed in the EPIC EMR & discussed w/ the patient...  LABORATORY DATA:  Reviewed in the EPIC EMR & discussed w/ the patient...   Assessment:      HBP> On Diltiazem + Cozaar from [facility]; says she was INTOL to [place] but couldn't say what the reaction was; BP is ok on her 2 meds...  MVP> Followed for Cards by Quentin Ore, hx palpit & dizzy- all test neg & she was reassured...   CHOL> Sl incr LDL chol on diet alone but HDL is excellent...   GI> IBS, Hems> Now on Align & adjusted fiber content, followed by DrGessner in the past & may need f/u soon...   Osteoporosis> on Calcium, MVI, VitD- she has refused all other meds/ Bisphosphonates, Reclast etc; followed by GYN in [place]...  Psyche> States she is disabled due to her nerves & followed by DrPlovsky on WellbutrinXL300 & Ativan 0.5mg  1/2 tab Bid prn; he recently started KlonopinODT 0.25Bid w/ improvement...     Plan:     Patient's Medications  New Prescriptions   No medications on file  Previous Medications   BUPROPION (WELLBUTRIN XL) 300 MG 24 HR TABLET    Take 300 mg by mouth daily.   CALCIUM-VITAMIN D (OSCAL WITH D) 500-200 MG-UNIT PER TABLET    Take 1  tablet by mouth 2 (two) times daily.     LORAZEPAM (ATIVAN) 0.5 MG TABLET    Take 0.5 mg by mouth 2 (two) times daily.   Modified Medications   Modified Medication Previous Medication   DILTIAZEM (CARTIA XT) 120 MG 24 HR CAPSULE CARTIA XT 120 MG 24 hr capsule      TAKE 1 CAPSULE BY MOUTH EVERY DAY    TAKE 1 CAPSULE BY MOUTH EVERY DAY   LEVOTHYROXINE (SYNTHROID, LEVOTHROID) 50 MCG TABLET levothyroxine (SYNTHROID) 50 MCG tablet      1/2 tab by mouth once daily    Take 1 tablet (50 mcg total) by mouth daily before breakfast.  LOSARTAN (COZAAR) 50 MG TABLET losartan (COZAAR) 50 MG tablet      Take 1 tablet (50 mg total) by mouth daily.    Take 1 tablet (50 mg total) by mouth daily.  Discontinued Medications   CLONAZEPAM (KLONOPIN) 0.25 MG DISINTEGRATING TABLET    Take 0.25 mg by mouth 2 (two) times daily as needed. Per Dr. Casimiro Needle   IRBESARTAN (AVAPRO) 75 MG TABLET    Take 1 tablet (75 mg total) by mouth daily.   VALSARTAN (DIOVAN) 80 MG TABLET    Take 1 tablet (80 mg total) by mouth daily.

## 2013-05-05 ENCOUNTER — Other Ambulatory Visit: Payer: Self-pay | Admitting: Pulmonary Disease

## 2013-05-05 LAB — SEDIMENTATION RATE: SED RATE: 4 mm/h (ref 0–40)

## 2013-05-05 MED ORDER — LEVOTHYROXINE SODIUM 50 MCG PO TABS: 50.0000 ug | ORAL_TABLET | Freq: Every day | ORAL | Status: DC

## 2013-05-08 ENCOUNTER — Telehealth: Payer: Self-pay | Admitting: Pulmonary Disease

## 2013-05-08 NOTE — Telephone Encounter (Signed)
Called and spoke with pt. Made her aware she is to continue this medication until she see's new PCP and do f/u labs. Nothing further needed

## 2013-05-10 ENCOUNTER — Telehealth: Payer: Self-pay | Admitting: Pulmonary Disease

## 2013-05-10 NOTE — Telephone Encounter (Signed)
Per SN---  We called in synthroid 50 mcg daily---  Ask her to decrease the dose to 1/2 tablet daily.  thanks

## 2013-05-10 NOTE — Telephone Encounter (Signed)
Called spoke with patient, advised of SN's recs as stated below.  Pt okay with this recommendation and verbalized her understanding.  Med list updated.  Pt aware to call back if her symptoms persist or worsen.  Nothing further needed; will sign off.

## 2013-05-10 NOTE — Telephone Encounter (Signed)
Called spoke with pt. She reports the levothyroxine is causing her skin tof eel real hot, lips red, feel nauseated. Please advise SN thanks  Allergies  Allergen Reactions  . Alendronate Sodium     REACTION: pt states INTOL to Fosamax  . Verapamil     REACTION: Intol w/ bradycardia

## 2013-05-23 ENCOUNTER — Encounter: Payer: Self-pay | Admitting: *Deleted

## 2013-05-29 ENCOUNTER — Encounter: Payer: Self-pay | Admitting: Cardiovascular Disease

## 2013-05-29 ENCOUNTER — Ambulatory Visit (INDEPENDENT_AMBULATORY_CARE_PROVIDER_SITE_OTHER): Payer: Medicare Other | Admitting: Cardiovascular Disease

## 2013-05-29 VITALS — BP 122/70 | HR 54 | Ht 60.0 in | Wt 116.0 lb

## 2013-05-29 DIAGNOSIS — I10 Essential (primary) hypertension: Secondary | ICD-10-CM

## 2013-05-29 DIAGNOSIS — I471 Supraventricular tachycardia, unspecified: Secondary | ICD-10-CM

## 2013-05-29 DIAGNOSIS — R002 Palpitations: Secondary | ICD-10-CM

## 2013-05-29 DIAGNOSIS — E785 Hyperlipidemia, unspecified: Secondary | ICD-10-CM

## 2013-05-29 DIAGNOSIS — I498 Other specified cardiac arrhythmias: Secondary | ICD-10-CM

## 2013-05-29 HISTORY — DX: Supraventricular tachycardia, unspecified: I47.10

## 2013-05-29 HISTORY — DX: Supraventricular tachycardia: I47.1

## 2013-05-29 NOTE — Progress Notes (Signed)
Patient ID: Chloe Perkins, female   DOB: Jul 10, 1945, 68 y.o.   MRN: [id number]     HPI: Chloe Perkins is a 68 y.o. female who presents to the office for one year.  Cardiology evaluation.  Ms. bride is a 68 year old female who has a history of superventricular tachycardia, for which she has been on diltiazem at 120 mg, a history of hypertension, treated with losartan, 50 mg, and recently was just started on levothyroxin, which he is taking 25 mg prescribed by Dr. Leeanne Deed for mild TSH elevation.  Over past year, she denies any episodes of chest pain.  She denies shortness of breath.  She has been unaware of cold or heat intolerance.  She denies recent palpitations.  He tells me Dr. Leeanne Deed will no longer be in primary care and will be exclusively in pulmonary as he slows down to his retirement..  She is also seeking for a referral for primary care evaluation.  I did review her recent laboratory that she had done 3 weeks ago at Dr. Maxie Barb office.  Liver function was normal.  Total cholesterol was at 202, triglycerides 47, HDL 83, LDL 109.  Sedimentation rate was 4.  TSH was minimally increased at 6.03.  Hemoglobin 14.3, hematocrit 42.4.  Past Medical History  Diagnosis Date  . Hypertension   . Mitral valve prolapse   . HLD (hyperlipidemia)   . Hemorrhoids   . Osteoporosis   . Personal history of malignant melanoma of skin   . Viral gastroenteritis   . Anxiety and depression   . History of melanoma   . Asthmatic bronchitis   . SVT (supraventricular tachycardia)     Past Surgical History  Procedure Laterality Date  . Endometrial ablation  1976  . Melanoma excision  1999    superficial spreading melanoma right post shoulder  . Left thumb tendon repair  10/09  . Abdominal hysterectomy  1975  . Colonoscopy  04/15/2004    2006: Normal  . Wrist surgery Left 2011  . Skin cancer excision  03/02/2013    superficial basal cell cancer  . US echocardiography  10/16/2010    mitral valve  leaflets midly thickened,trace MR.  Marland Kitchen Nm myocar perf wall motion  12/28/2008    No ischemia    Allergies  Allergen Reactions  . Alendronate Sodium     REACTION: pt states INTOL to Fosamax  . Verapamil     REACTION: Intol w/ bradycardia    Current Outpatient Prescriptions  Medication Sig Dispense Refill  . buPROPion (WELLBUTRIN XL) 300 MG 24 hr tablet Take 300 mg by mouth daily.      . calcium-vitamin D (OSCAL WITH D) 500-200 MG-UNIT per tablet Take 1 tablet by mouth 2 (two) times daily.        Marland Kitchen diltiazem (CARTIA XT) 120 MG 24 hr capsule TAKE 1 CAPSULE BY MOUTH EVERY DAY  30 capsule  6  . levothyroxine (SYNTHROID, LEVOTHROID) 50 MCG tablet 1/2 tab by mouth once daily      . LORazepam (ATIVAN) 0.5 MG tablet Take 0.5 mg by mouth 2 (two) times daily.       Marland Kitchen losartan (COZAAR) 50 MG tablet Take 1 tablet (50 mg total) by mouth daily.  30 tablet  6   No current facility-administered medications for this visit.    History   Social History  . Marital Status: Married    Spouse Name: sammy Caraher    Number of Children: 1  . Years of  Education: N/A   Occupational History  . Retired    Social History Main Topics  . Smoking status: Former Smoker -- 0.50 packs/day for 20 years    Types: Cigarettes    Quit date: 02/09/1990  . Smokeless tobacco: Never Used  . Alcohol Use: No  . Drug Use: No  . Sexual Activity: Not on file   Other Topics Concern  . Not on file   Social History Narrative   She lives her family, she has one child, retired, no smoke no drink    Family History  Problem Relation Age of Onset  . Emphysema Father     PGF, brother, sister  . COPD Father     PGF, brother, sister  . Rheum arthritis Mother   . Stroke Mother   . Heart disease Mother     Father, brother, sister  . Lung cancer Paternal Grandfather   . Ovarian cancer Paternal Aunt   . Breast cancer Cousin   . Cancer Brother     ROS is negative for fevers, chills or night sweats.  He denies rash.   She denies tremors.  She denies change in vision or hearing.  There is no lymphadenopathy.  She denies wheezing.  She denies PND, or a pop it.  She is unaware of recent palpitations on her current dose of diltiazem.  She denies anginal symptoms.  There is no nausea, vomiting, or diarrhea.  She denies claudication.  She denies significant heat or cold intolerance.  She was started on 50 mcg of Synthroid, but did not seem to tolerate this and has been cutting this in half.  She denies difficulty with sleep.  Other comprehensive 14 point system review is negative.  PE BP 122/70  Pulse 54  Ht 5' (1.524 m)  Wt 116 lb (52.617 kg)  BMI 22.65 kg/m2  General: Alert, oriented, no distress.  Skin: normal turgor, no rashes HEENT: Normocephalic, atraumatic. Pupils round and reactive; sclera anicteric;no lid lag. Extraocular muscles intact;; no xanthelasmas. Nose without nasal septal hypertrophy Mouth/Parynx benign; Mallinpatti scale 2 Neck: No JVD, no carotid bruits; normal carotid upstroke Lungs: clear to ausculatation and percussion; no wheezing or rales Chest wall: no tenderness to palpitation; mild pectus excavatum Heart: RRR, s1 s2 normal; 1/6 systolic murmur.  no diastolic murmur, rub thrills or heaves Abdomen: soft, nontender; no hepatosplenomehaly, BS+; abdominal aorta nontender and not dilated by palpation. Back: no CVA tenderness Pulses 2+ Extremities: no clubbing cyanosis or edema, Homan's sign negative  Neurologic: grossly nonfocal; cranial nerves grossly normal. Psychologic: normal affect and mood.  ECG (independently read by me): Sinus bradycardia 54 beats per minute.  Mild RV conduction delay.  No ectopy.  LABS:  BMET    Component Value Date/Time   NA 141 05/04/2013 0817   K 4.6 05/04/2013 0817   CL 106 05/04/2013 0817   CO2 30 05/04/2013 0817   GLUCOSE 94 05/04/2013 0817   BUN 17 05/04/2013 0817   CREATININE 1.1 05/04/2013 0817   CALCIUM 9.6 05/04/2013 0817   GFRNONAA 65.10 07/10/2009  1612   GFRAA 81 02/06/2008 1254     Hepatic Function Panel     Component Value Date/Time   PROT 6.9 05/04/2013 0817   ALBUMIN 4.1 05/04/2013 0817   AST 20 05/04/2013 0817   ALT 15 05/04/2013 0817   ALKPHOS 74 05/04/2013 0817   BILITOT 0.4 05/04/2013 0817   BILIDIR 0.0 05/04/2013 0817     CBC    Component Value Date/Time  WBC 3.7* 05/04/2013 0817   RBC 4.47 05/04/2013 0817   HGB 14.3 05/04/2013 0817   HCT 42.4 05/04/2013 0817   PLT 201.0 05/04/2013 0817   MCV 94.7 05/04/2013 0817   MCH 31.4 05/25/2012 1659   MCHC 33.8 05/04/2013 0817   RDW 12.9 05/04/2013 0817   LYMPHSABS 0.8 05/04/2013 0817   MONOABS 0.4 05/04/2013 0817   EOSABS 0.0 05/04/2013 0817   BASOSABS 0.0 05/04/2013 0817     BNP No results found for this basename: probnp    Lipid Panel     Component Value Date/Time   CHOL 202* 05/04/2013 0817   TRIG 47.0 05/04/2013 0817   HDL 83.40 05/04/2013 0817   CHOLHDL 2 05/04/2013 0817   VLDL 9.4 05/04/2013 0817   LDLCALC 109* 05/04/2013 0817     RADIOLOGY: No results found.    ASSESSMENT AND PLAN: Ms. Lavora Brisbon is a 68 year old female who has a history of supraventricular tachycardia, as well as hypertension.  Her blood pressure today is well controlled on her combination diltiazem 120 mg and losartan 50 mg.  She's not having any ectopy.  She is unaware of recent palpitations.  Intervals are normal on ECG.  She did have very minimal TSH elevation on a recent TSH.  Dr. Leeanne Deed  started her on Synthroid initially 50 mcg.  She did not tolerate this and has been taking 25 mcg.  She will need followup laboratory on this reduced dose and it is  possible that she may not require any thyroid medication, particularly if her subsequent level is normal.  Review of her chart indicates consistently normal TSH levels over the past 6 years.  She's not having any chest pain.  She does have a very mild systolic murmur and an echo Doppler study in 2012 demonstrated trace mitral regurgitation and  trace tricuspid regurgitation.  Aortic valve was trileaflet and normal.  In 2010.  She did undergo a nuclear perfusion study, which was normal.  I did give her a referral to go from medical to establish primary care with them.  If she prefers to leave the [facility] primary care since Dr. Leeanne Deed will no longer be seeing her.  I will see her in one year for cardiology reevaluation or sooner if problems arise.     Troy Sine, MD, [facility]  05/29/2013 7:01 PM

## 2013-05-29 NOTE — Patient Instructions (Signed)
Your physician recommends that you schedule a follow-up appointment in: 1 year  You have been referred to [facility] for primary care

## 2013-06-14 ENCOUNTER — Telehealth: Payer: Self-pay | Admitting: Neurology

## 2013-06-14 NOTE — Telephone Encounter (Signed)
Message copied by Marcial Pacas on Wed Jun 14, 2013  5:13 PM ------      Message from: [facility], CARMEN      Created: Fri Jun 02, 2013  3:54 PM      Regarding: FW: WID - Dr.Dustin Bumbaugh out of the office until  05-24-2013                   ----- Message -----         From: Rance Muir         Sent: 05/08/2013  11:36 AM           To: Larey Seat, MD      Subject: WID - Dr.Edi Gorniak out of the office until  04-15-#                        ----- Message -----         From: [facility] Lab Results In Interface         Sent: 05/05/2013   5:51 AM           To: Marcial Pacas, MD             ------

## 2013-06-26 ENCOUNTER — Encounter: Payer: Self-pay | Admitting: Internal Medicine

## 2013-06-26 ENCOUNTER — Ambulatory Visit (INDEPENDENT_AMBULATORY_CARE_PROVIDER_SITE_OTHER): Payer: Medicare Other | Admitting: Internal Medicine

## 2013-06-26 ENCOUNTER — Encounter (INDEPENDENT_AMBULATORY_CARE_PROVIDER_SITE_OTHER): Payer: Self-pay

## 2013-06-26 VITALS — BP 120/68 | HR 53 | Temp 98.2°F | Resp 16 | Ht 64.0 in | Wt 115.8 lb

## 2013-06-26 DIAGNOSIS — I10 Essential (primary) hypertension: Secondary | ICD-10-CM

## 2013-06-26 DIAGNOSIS — E039 Hypothyroidism, unspecified: Secondary | ICD-10-CM

## 2013-06-26 DIAGNOSIS — N183 Chronic kidney disease, stage 3 unspecified: Secondary | ICD-10-CM

## 2013-06-26 DIAGNOSIS — K589 Irritable bowel syndrome without diarrhea: Secondary | ICD-10-CM

## 2013-06-26 DIAGNOSIS — E038 Other specified hypothyroidism: Secondary | ICD-10-CM | POA: Insufficient documentation

## 2013-06-26 DIAGNOSIS — E785 Hyperlipidemia, unspecified: Secondary | ICD-10-CM

## 2013-06-26 DIAGNOSIS — Z90710 Acquired absence of both cervix and uterus: Secondary | ICD-10-CM | POA: Insufficient documentation

## 2013-06-26 DIAGNOSIS — M810 Age-related osteoporosis without current pathological fracture: Secondary | ICD-10-CM

## 2013-06-26 NOTE — Assessment & Plan Note (Signed)
Well controlled on current regimen. Renal function stable, no changes today. 

## 2013-06-26 NOTE — Addendum Note (Signed)
Addended by: Royann Shivers A on: 06/26/2013 04:01 PM   Modules accepted: Orders

## 2013-06-26 NOTE — Assessment & Plan Note (Signed)
Discussed today with review of last 2 years of Cr.  Patient is taking losartan for THN,  No NSAIDs and has normal lipids .
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
## 2013-06-26 NOTE — Patient Instructions (Signed)
You need about 1800 mg calcium and 1000 units of vitaqmin D daily to haelp yoru bones fight osteoporosis  I recommend getting 1/2 of this through supplements and 2/3 through diet , if possible  Consider trying the [facility] almond/cocucnut milk blend.  It is high in both calcium and vitamin d   Osteoporosis Throughout your life, your body breaks down old bone and replaces it with new bone. As you get older, your body does not replace bone as quickly as it breaks it down. By the age of 53 years, most people begin to gradually lose bone because of the imbalance between bone loss and replacement. Some people lose more bone than others. Bone loss beyond a specified normal degree is considered osteoporosis.  Osteoporosis affects the strength and durability of your bones. The inside of the ends of your bones and your flat bones, like the bones of your pelvis, look like honeycomb, filled with tiny open spaces. As bone loss occurs, your bones become less dense. This means that the open spaces inside your bones become bigger and the walls between these spaces become thinner. This makes your bones weaker. Bones of a person with osteoporosis can become so weak that they can break (fracture) during minor accidents, such as a simple fall. CAUSES  The following factors have been associated with the development of osteoporosis:  Smoking.  Drinking more than 2 alcoholic drinks several days per week.  Long-term use of certain medicines:  Corticosteroids.  Chemotherapy medicines.  Thyroid medicines.  Antiepileptic medicines.  Gonadal hormone suppression medicine.  Immunosuppression medicine.  Being underweight.  Lack of physical activity.  Lack of exposure to the sun. This can lead to vitamin D deficiency.  Certain medical conditions:  Certain inflammatory bowel diseases, such as Crohn disease and ulcerative colitis.  Diabetes.  Hyperthyroidism.  Hyperparathyroidism. RISK  FACTORS Anyone can develop osteoporosis. However, the following factors can increase your risk of developing osteoporosis:  Gender Women are at higher risk than men.  Age Being older than 109 years increases your risk.  Ethnicity White and Asian people have an increased risk.  Weight Being extremely underweight can increase your risk of osteoporosis.  Family history of osteoporosis Having a family member who has developed osteoporosis can increase your risk. SYMPTOMS  Usually, people with osteoporosis have no symptoms.  DIAGNOSIS  Signs during a physical exam that may prompt your caregiver to suspect osteoporosis include:  Decreased height. This is usually caused by the compression of the bones that form your spine (vertebrae) because they have weakened and become fractured.  A curving or rounding of the upper back (kyphosis). To confirm signs of osteoporosis, your caregiver may request a procedure that uses 2 low-dose X-ray beams with different levels of energy to measure your bone mineral density (dual-energy X-ray absorptiometry [DXA]). Also, your caregiver may check your level of vitamin D. TREATMENT  The goal of osteoporosis treatment is to strengthen bones in order to decrease the risk of bone fractures. There are different types of medicines available to help achieve this goal. Some of these medicines work by slowing the processes of bone loss. Some medicines work by increasing bone density. Treatment also involves making sure that your levels of calcium and vitamin D are adequate. PREVENTION  There are things you can do to help prevent osteoporosis. Adequate intake of calcium and vitamin D can help you achieve optimal bone mineral density. Regular exercise can also help, especially resistance and weight-bearing activities. If you smoke, quitting  smoking is an important part of osteoporosis prevention. MAKE SURE YOU:  Understand these instructions.  Will watch your  condition.  Will get help right away if you are not doing well or get worse. FOR MORE INFORMATION www.osteo.org and EquipmentWeekly.com.ee Document Released: 11/05/2004 Document Revised: 05/23/2012 Document Reviewed: 01/10/2011 [facility] Patient Information 2014 [place], [place].

## 2013-06-26 NOTE — Assessment & Plan Note (Addendum)
T score -3.1 left femur Dec 2013.  Priorfractures.  Prior  trial of fosomax caused gastritis.,  Prolia was discussed but deferred.  Discussed nonpharmacologic treatments

## 2013-06-26 NOTE — Assessment & Plan Note (Addendum)
Did not tolerate synthroid 50 mcg daily.  Isolated TSH of 6 may have been euthyroid.  Repeat tsh and free t4 today .  Given asymptomatic status and history of osteoporosis will avoid supplementing unless TSH is > 8

## 2013-06-26 NOTE — Assessment & Plan Note (Signed)
No episodes in over a year. 

## 2013-06-26 NOTE — Progress Notes (Signed)
Pre-visit discussion using our clinic review tool. No additional management support is needed unless otherwise documented below in the visit note.  

## 2013-06-26 NOTE — Assessment & Plan Note (Signed)
Well controlled on diet alone .  Lab Results  Component Value Date   CHOL 202* 05/04/2013   HDL 83.40 05/04/2013   LDLCALC 109* 05/04/2013   LDLDIRECT 102.2 04/23/2006   TRIG 47.0 05/04/2013   CHOLHDL 2 05/04/2013

## 2013-06-27 ENCOUNTER — Telehealth: Payer: Self-pay | Admitting: Internal Medicine

## 2013-06-27 LAB — COMPREHENSIVE METABOLIC PANEL
ALT: 13 U/L (ref 0–35)
AST: 25 U/L (ref 0–37)
Albumin: 4.2 g/dL (ref 3.5–5.2)
Alkaline Phosphatase: 69 U/L (ref 39–117)
BILIRUBIN TOTAL: 0.2 mg/dL (ref 0.2–1.2)
BUN: 18 mg/dL (ref 6–23)
CO2: 28 mEq/L (ref 19–32)
CREATININE: 1.2 mg/dL (ref 0.4–1.2)
Calcium: 9.5 mg/dL (ref 8.4–10.5)
Chloride: 103 mEq/L (ref 96–112)
GFR: 46.44 mL/min — ABNORMAL LOW (ref >60.00)
Glucose, Bld: 76 mg/dL (ref 70–99)
Potassium: 4.4 mEq/L (ref 3.5–5.1)
Sodium: 138 mEq/L (ref 135–145)
Total Protein: 7.1 g/dL (ref 6.0–8.3)

## 2013-06-27 LAB — TSH: TSH: 0.81 u[IU]/mL (ref 0.35–4.50)

## 2013-06-27 LAB — T4, FREE: Free T4: 0.84 ng/dL (ref 0.60–1.60)

## 2013-06-27 NOTE — Telephone Encounter (Signed)
Relevant patient education mailed to patient.  

## 2013-06-28 ENCOUNTER — Encounter: Payer: Self-pay | Admitting: *Deleted

## 2013-07-22 ENCOUNTER — Other Ambulatory Visit: Payer: Self-pay | Admitting: Internal Medicine

## 2013-07-24 NOTE — Telephone Encounter (Signed)
Ok to refill,  printed rx  

## 2013-07-24 NOTE — Telephone Encounter (Signed)
Last 06/26/13, ok refill?

## 2013-10-27 ENCOUNTER — Telehealth: Payer: Self-pay | Admitting: Cardiovascular Disease

## 2013-10-27 MED ORDER — DILTIAZEM HCL ER COATED BEADS 120 MG PO CP24: ORAL_CAPSULE | ORAL | Status: DC

## 2013-10-27 MED ORDER — LOSARTAN POTASSIUM 50 MG PO TABS: 50.0000 mg | ORAL_TABLET | Freq: Every day | ORAL | Status: DC

## 2013-10-27 NOTE — Telephone Encounter (Signed)
Pt need refills on Losartan 50 mg #30 and,Carttia 120 mg #30, please. Please call to [place].

## 2013-10-27 NOTE — Telephone Encounter (Signed)
Rx was sent to pharmacy electronically. 

## 2013-12-13 ENCOUNTER — Other Ambulatory Visit (INDEPENDENT_AMBULATORY_CARE_PROVIDER_SITE_OTHER): Payer: Medicare Other

## 2013-12-13 ENCOUNTER — Other Ambulatory Visit: Payer: Self-pay | Admitting: Internal Medicine

## 2013-12-13 DIAGNOSIS — N183 Chronic kidney disease, stage 3 unspecified: Secondary | ICD-10-CM

## 2013-12-14 LAB — BASIC METABOLIC PANEL
BUN: 14 mg/dL (ref 6–23)
CHLORIDE: 104 meq/L (ref 96–112)
CO2: 28 mEq/L (ref 19–32)
Calcium: 9.9 mg/dL (ref 8.4–10.5)
Creatinine, Ser: 1.1 mg/dL (ref 0.4–1.2)
GFR: 53.38 mL/min — ABNORMAL LOW (ref >60.00)
GLUCOSE: 84 mg/dL (ref 70–99)
POTASSIUM: 4.6 meq/L (ref 3.5–5.1)
Sodium: 139 mEq/L (ref 135–145)

## 2013-12-14 LAB — MICROALBUMIN / CREATININE URINE RATIO
Creatinine,U: 57.3 mg/dL
Microalb Creat Ratio: 0.5 mg/g (ref 0.0–30.0)
Microalb, Ur: 0.3 mg/dL (ref 0.0–1.9)

## 2013-12-14 LAB — PTH, INTACT AND CALCIUM
Calcium: 9.9 mg/dL (ref 8.4–10.5)
PTH: 20 pg/mL (ref 14–64)

## 2013-12-18 ENCOUNTER — Encounter: Payer: Self-pay | Admitting: *Deleted

## 2013-12-18 ENCOUNTER — Telehealth: Payer: Self-pay

## 2013-12-18 NOTE — Telephone Encounter (Signed)
I cannot overbook anymore.  She had the labs done in May ; there is no urgency here because the  [place].  Not sure whey she decided to wait until last week to become concerned.  If she would like to see a nephrologist I will make the referral.

## 2013-12-18 NOTE — Telephone Encounter (Signed)
First available appointment is 01/24/14, Patient would like some information, stated you had mentioned at her husbands appointment she made need to see a urologist please advise.

## 2013-12-18 NOTE — Telephone Encounter (Signed)
Left message for patient to return call to office. 

## 2013-12-18 NOTE — Telephone Encounter (Signed)
The patient's husband called back.

## 2013-12-18 NOTE — Telephone Encounter (Signed)
Patient came ito office given copy of labs and notified of results patient concerned because at top of lab sheet stated DX of stage 3 chronic kidney disease patient stated she is not aware of this DX please advise.

## 2013-12-18 NOTE — Telephone Encounter (Signed)
Please offer her an appt.  She brought this up last week and we discussed it breifly during her husband's appt, but there was not enough time.

## 2013-12-18 NOTE — Telephone Encounter (Signed)
The patient called and stated she wants to go over lab work results.

## 2013-12-19 ENCOUNTER — Ambulatory Visit: Payer: No Typology Code available for payment source | Admitting: Cardiovascular Disease

## 2013-12-19 NOTE — Telephone Encounter (Signed)
Tried to reach patient today no answer and no voicemail.

## 2013-12-20 NOTE — Telephone Encounter (Signed)
Patient aware of appointment time

## 2013-12-27 ENCOUNTER — Encounter: Payer: Self-pay | Admitting: Cardiovascular Disease

## 2013-12-27 ENCOUNTER — Ambulatory Visit (INDEPENDENT_AMBULATORY_CARE_PROVIDER_SITE_OTHER): Payer: Medicare Other | Admitting: Cardiovascular Disease

## 2013-12-27 VITALS — BP 135/78 | HR 77 | Ht 64.0 in | Wt 120.2 lb

## 2013-12-27 DIAGNOSIS — I10 Essential (primary) hypertension: Secondary | ICD-10-CM

## 2013-12-27 DIAGNOSIS — F329 Major depressive disorder, single episode, unspecified: Secondary | ICD-10-CM

## 2013-12-27 DIAGNOSIS — F419 Anxiety disorder, unspecified: Secondary | ICD-10-CM

## 2013-12-27 DIAGNOSIS — N183 Chronic kidney disease, stage 3 unspecified: Secondary | ICD-10-CM

## 2013-12-27 DIAGNOSIS — F418 Other specified anxiety disorders: Secondary | ICD-10-CM

## 2013-12-27 DIAGNOSIS — F32A Depression, unspecified: Secondary | ICD-10-CM

## 2013-12-27 DIAGNOSIS — I471 Supraventricular tachycardia: Secondary | ICD-10-CM

## 2013-12-27 NOTE — Progress Notes (Signed)
Patient ID: Chloe Perkins, female   DOB: 03-05-1945, 68 y.o.   MRN: [id number]     HPI: Chloe Perkins is a 68 y.o. female who presents to the office for a 7 month cardiology follow-up evaluation.  [name] has a history of SVT, for which she has been on diltiazem at 120 mg, and a history of hypertension, treated with losartan, 50 mg, remotely, she had been on low-dose levothyroxine 25 g but is no longer taking this.  She has recently switched from Dr. Cranston Neighbor detail who no longer is doing primary care to Dr. Helene Kelp total low.  She had had laboratory done 2 weeks ago when I have personally reviewed this.  Her BUN was 14, creatinine 1.1, which has been fairly stable.  However, with her age and female status.  This gives a GFR of 53.3, which places her in stage III chronic kidney disease category.  She did not have evidence for albuminurea.  Last year, her total cholesterol was 202, triglycerides 47, HDL 83, and LDL cholesterol 109.  Presently, she denies chest pain.  She denies PND or orthopnea.  She denies any recurrent tachypalpitations.  She has experienced chronic back pain. She presents for evaluation.   Past Medical History  Diagnosis Date  . Hypertension   . Mitral valve prolapse   . HLD (hyperlipidemia)   . Hemorrhoids   . Osteoporosis   . Personal history of malignant melanoma of skin   . Viral gastroenteritis   . Anxiety and depression   . History of melanoma   . Asthmatic bronchitis   . SVT (supraventricular tachycardia)     Past Surgical History  Procedure Laterality Date  . Endometrial ablation  1976  . Melanoma excision  1999    superficial spreading melanoma right post shoulder  . Left thumb tendon repair  10/09  . Abdominal hysterectomy  1975  . Colonoscopy  04/15/2004    2006: Normal  . Wrist surgery Left 2011  . Skin cancer excision  03/02/2013    superficial basal cell cancer  . US echocardiography  10/16/2010    mitral valve leaflets midly  thickened,trace MR.  Marland Kitchen Nm myocar perf wall motion  12/28/2008    No ischemia    Allergies  Allergen Reactions  . Alendronate Sodium     REACTION: pt states INTOL to Fosamax  . Verapamil     REACTION: Intol w/ bradycardia    Current Outpatient Prescriptions  Medication Sig Dispense Refill  . buPROPion (WELLBUTRIN XL) 300 MG 24 hr tablet Take 300 mg by mouth daily.    . calcium-vitamin D (OSCAL WITH D) 500-200 MG-UNIT per tablet Take 1 tablet by mouth 2 (two) times daily.      Marland Kitchen diltiazem (CARTIA XT) 120 MG 24 hr capsule TAKE 1 CAPSULE BY MOUTH EVERY DAY 30 capsule 7  . FLUVIRIN SUSP   0  . LORazepam (ATIVAN) 0.5 MG tablet Take 0.5 mg by mouth 2 (two) times daily.     Marland Kitchen losartan (COZAAR) 50 MG tablet Take 1 tablet (50 mg total) by mouth daily. 30 tablet 7   No current facility-administered medications for this visit.    History   Social History  . Marital Status: Married    Spouse Name: sammy Meckel    Number of Children: 1  . Years of Education: N/A   Occupational History  . Retired    Social History Main Topics  . Smoking status: Former Smoker -- 0.50 packs/day for  20 years    Types: Cigarettes    Quit date: 02/09/1990  . Smokeless tobacco: Never Used  . Alcohol Use: No  . Drug Use: No  . Sexual Activity: Not on file   Other Topics Concern  . Not on file   Social History Narrative   She lives her family, she has one child, retired, no smoke no drink    Family History  Problem Relation Age of Onset  . Emphysema Father     PGF, brother, sister  . COPD Father     PGF, brother, sister  . Rheum arthritis Mother   . Stroke Mother   . Heart disease Mother     Father, brother, sister  . Lung cancer Paternal Grandfather   . Ovarian cancer Paternal Aunt   . Breast cancer Cousin   . Cancer Brother     ROS General: Negative; No fevers, chills, or night sweats;  HEENT: Negative; No changes in vision or hearing, sinus congestion, difficulty  swallowing Pulmonary: Negative; No cough, wheezing, shortness of breath, hemoptysis Cardiovascular: Negative; No chest pain, presyncope, syncope, palpitations GI: Negative; No nausea, vomiting, diarrhea, or abdominal pain GU: Negative; No dysuria, hematuria, or difficulty voiding Musculoskeletal: she complains of chronic back pain.  Hematologic/Oncology: Negative; no easy bruising, bleeding Endocrine: Negative; no heat/cold intolerance; no diabetes Neuro: Negative; no changes in balance, headaches Skin: Negative; No rashes or skin lesions Psychiatric: Negative; No behavioral problems, depression Sleep: Negative; No snoring, daytime sleepiness, hypersomnolence, bruxism, restless legs, hypnogognic hallucinations, no cataplexy Other comprehensive 14 point system review is negative.   PE BP 135/78 mmHg  Pulse 77  Ht 5\' 4"  (1.626 m)  Wt 120 lb 3.2 oz (54.522 kg)  BMI 20.62 kg/m2  General: Alert, oriented, no distress.  Skin: normal turgor, no rashes HEENT: Normocephalic, atraumatic. Pupils round and reactive; sclera anicteric;no lid lag. Extraocular muscles intact;; no xanthelasmas. Nose without nasal septal hypertrophy Mouth/Parynx benign; Mallinpatti scale 2 Neck: No JVD, no carotid bruits; normal carotid upstroke Lungs: clear to ausculatation and percussion; no wheezing or rales Chest wall: no tenderness to palpitation; mild pectus excavatum Heart: RRR, s1 s2 normal; 1/6 systolic murmur.  no diastolic murmur, rub thrills or heaves Abdomen: soft, nontender; no hepatosplenomehaly, BS+; abdominal aorta nontender and not dilated by palpation. Back: no CVA tenderness Pulses 2+ Extremities: no clubbing cyanosis or edema, Homan's sign negative  Neurologic: grossly nonfocal; cranial nerves grossly normal. Psychologic: normal affect and mood.  ECG (independently read by me0;  Normal sinus rhythm at 77 bpm.  RV conduction delay.  Nondiagnostic small Q waves.  Nonspecific ST changes.  April  2015 ECG (independently read by me): Sinus bradycardia 54 beats per minute.  Mild RV conduction delay.  No ectopy.  LABS:  BMET    Component Value Date/Time   NA 139 12/13/2013 1500   K 4.6 12/13/2013 1500   CL 104 12/13/2013 1500   CO2 28 12/13/2013 1500   GLUCOSE 84 12/13/2013 1500   BUN 14 12/13/2013 1500   CREATININE 1.1 12/13/2013 1500   CALCIUM 9.9 12/13/2013 1500   CALCIUM 9.9 12/13/2013 1500   GFRNONAA 65.10 07/10/2009 1612   GFRAA 81 02/06/2008 1254     Hepatic Function Panel     Component Value Date/Time   PROT 7.1 06/26/2013 1601   ALBUMIN 4.2 06/26/2013 1601   AST 25 06/26/2013 1601   ALT 13 06/26/2013 1601   ALKPHOS 69 06/26/2013 1601   BILITOT 0.2 06/26/2013 1601   BILIDIR 0.0  05/04/2013 0817     CBC    Component Value Date/Time   WBC 3.7* 05/04/2013 0817   RBC 4.47 05/04/2013 0817   HGB 14.3 05/04/2013 0817   HCT 42.4 05/04/2013 0817   PLT 201.0 05/04/2013 0817   MCV 94.7 05/04/2013 0817   MCH 31.4 05/25/2012 1659   MCHC 33.8 05/04/2013 0817   RDW 12.9 05/04/2013 0817   LYMPHSABS 0.8 05/04/2013 0817   MONOABS 0.4 05/04/2013 0817   EOSABS 0.0 05/04/2013 0817   BASOSABS 0.0 05/04/2013 0817     BNP No results found for: PROBNP  Lipid Panel     Component Value Date/Time   CHOL 202* 05/04/2013 0817   TRIG 47.0 05/04/2013 0817   HDL 83.40 05/04/2013 0817   CHOLHDL 2 05/04/2013 0817   VLDL 9.4 05/04/2013 0817   LDLCALC 109* 05/04/2013 0817     RADIOLOGY: No results found.    ASSESSMENT AND PLAN: Ms. Cortlynn Hollinsworth is a 68 year old female who has a history of supraventricular tachycardia, as well as hypertension.  Her blood pressure today is well controlled on her combination diltiazem 120 mg and losartan 50 mg.  She's not having any ectopy.  She is unaware of recent palpitations.  Intervals are normal on ECG.  Previously, she had had mild TSH elevation for which he was started on low dose Synthroid.  She is no longer taking this.  Her  most recent laboratory reveals a creatinine of 1.1 which is slightly improved from 1.2.  She was concerned with being told that she had stage III chronic kidney disease.  She had made an appointment to see a urologist because of her concern, but has not yet seen one.  She denies any dysuria.  She denies any urinary retention.  I told her that her renal function most likely is treated by her age, giving her creatinine clearance of 54.  She is on low-dose ARB therapy, and at present I do not feel this needs to be discontinued.  Her blood pressure is controlled.  She also has a history of anxiety/depression for which she takes Wellbutrin in addition to Ativan.  There are no recurrent episodes of SVT.  I have recommended she continue her current therapy.  She will be seeing Dr. Derrel Nip in follow-up.  As long as she remains stable I will see her in one year for cardiology reevaluation.  Time spent: 25 minutes  Troy Sine, MD, [facility]  12/27/2013 3:44 PM

## 2013-12-27 NOTE — Patient Instructions (Signed)
Your physician wants you to follow-up in: 1 year or sooner if needed with Dr. Kelly. No changes were made today in your therapy. You will receive a reminder letter in the mail two months in advance. If you don't receive a letter, please call our office to schedule the follow-up appointment. 

## 2014-01-10 ENCOUNTER — Telehealth: Payer: Self-pay | Admitting: Internal Medicine

## 2014-01-10 NOTE — Telephone Encounter (Signed)
Patient would like a referral to have a mammogram done at the [facility].

## 2014-01-10 NOTE — Telephone Encounter (Signed)
Pt notified she may call and schedule her own mammogram.

## 2014-01-24 ENCOUNTER — Ambulatory Visit: Payer: No Typology Code available for payment source | Admitting: Internal Medicine

## 2014-02-01 ENCOUNTER — Encounter: Payer: Self-pay | Admitting: Cardiovascular Disease

## 2014-02-27 ENCOUNTER — Ambulatory Visit: Payer: Self-pay | Admitting: Internal Medicine

## 2014-02-27 LAB — HM MAMMOGRAPHY: HM MAMMO: NEGATIVE

## 2014-06-18 ENCOUNTER — Encounter: Payer: Self-pay | Admitting: Internal Medicine

## 2014-07-10 ENCOUNTER — Encounter: Payer: Self-pay | Admitting: Cardiovascular Disease

## 2014-07-10 ENCOUNTER — Ambulatory Visit (INDEPENDENT_AMBULATORY_CARE_PROVIDER_SITE_OTHER): Payer: Medicare Other | Admitting: Cardiovascular Disease

## 2014-07-10 VITALS — BP 138/80 | HR 50 | Ht 63.0 in | Wt 117.4 lb

## 2014-07-10 DIAGNOSIS — I10 Essential (primary) hypertension: Secondary | ICD-10-CM

## 2014-07-10 DIAGNOSIS — I341 Nonrheumatic mitral (valve) prolapse: Secondary | ICD-10-CM

## 2014-07-10 DIAGNOSIS — R002 Palpitations: Secondary | ICD-10-CM | POA: Diagnosis not present

## 2014-07-10 DIAGNOSIS — E785 Hyperlipidemia, unspecified: Secondary | ICD-10-CM

## 2014-07-10 DIAGNOSIS — F419 Anxiety disorder, unspecified: Secondary | ICD-10-CM

## 2014-07-10 DIAGNOSIS — F418 Other specified anxiety disorders: Secondary | ICD-10-CM

## 2014-07-10 DIAGNOSIS — F329 Major depressive disorder, single episode, unspecified: Secondary | ICD-10-CM

## 2014-07-10 DIAGNOSIS — N183 Chronic kidney disease, stage 3 unspecified: Secondary | ICD-10-CM

## 2014-07-10 DIAGNOSIS — Z79899 Other long term (current) drug therapy: Secondary | ICD-10-CM

## 2014-07-10 DIAGNOSIS — I471 Supraventricular tachycardia: Secondary | ICD-10-CM

## 2014-07-10 DIAGNOSIS — F32A Depression, unspecified: Secondary | ICD-10-CM

## 2014-07-10 MED ORDER — DILTIAZEM HCL ER COATED BEADS 120 MG PO CP24: ORAL_CAPSULE | ORAL | Status: DC

## 2014-07-10 MED ORDER — LOSARTAN POTASSIUM 50 MG PO TABS: 75.0000 mg | ORAL_TABLET | Freq: Every day | ORAL | Status: DC

## 2014-07-10 NOTE — Progress Notes (Signed)
Patient ID: Chloe Perkins, female   DOB: 05/05/1945, 69 y.o.   MRN: [id number]     HPI: Chloe Perkins is a 69 y.o. female who presents to the office for a 7 month cardiology follow-up evaluation.  Chloe Perkins has a history of SVT and hypertension, for which she has been on diltiazem at 120 mg  And losartan 50 mg daily.  There is also remote history of thyroid abnormality and in the past she had taken levothyroxine. She remains fairly active. She walks. She has mild renal insufficiency with stage III renal disease.  Has a history of anxiety/depression for which she takes Wellbutrin XL 300 mg daily.  She also is on calcium with vitamin D in light of osteoporosis.  Presently, she denies chest pain.  She denies PND or orthopnea.  She denies any recurrent tachypalpitations.  She has experienced chronic back pain. She presents for evaluation.   Past Medical History  Diagnosis Date  . Hypertension   . Mitral valve prolapse   . HLD (hyperlipidemia)   . Hemorrhoids   . Osteoporosis   . Personal history of malignant melanoma of skin   . Viral gastroenteritis   . Anxiety and depression   . History of melanoma   . Asthmatic bronchitis   . SVT (supraventricular tachycardia)     Past Surgical History  Procedure Laterality Date  . Endometrial ablation  1976  . Melanoma excision  1999    superficial spreading melanoma right post shoulder  . Left thumb tendon repair  10/09  . Abdominal hysterectomy  1975  . Colonoscopy  04/15/2004    2006: Normal  . Wrist surgery Left 2011  . Skin cancer excision  03/02/2013    superficial basal cell cancer  . US echocardiography  10/16/2010    mitral valve leaflets midly thickened,trace MR.  Marland Kitchen Nm myocar perf wall motion  12/28/2008    No ischemia    Allergies  Allergen Reactions  . Alendronate Sodium     REACTION: pt states INTOL to Fosamax  . Verapamil     REACTION: Intol w/ bradycardia    Current Outpatient Prescriptions  Medication Sig  Dispense Refill  . buPROPion (WELLBUTRIN XL) 300 MG 24 hr tablet Take 300 mg by mouth daily.    . calcium-vitamin D (OSCAL WITH D) 500-200 MG-UNIT per tablet Take 1 tablet by mouth 2 (two) times daily.      Marland Kitchen diltiazem (CARTIA XT) 120 MG 24 hr capsule TAKE 1 CAPSULE BY MOUTH AT BEDTIME 30 capsule 7  . FLUVIRIN SUSP   0  . LORazepam (ATIVAN) 0.5 MG tablet Take 0.5 mg by mouth 2 (two) times daily.     Marland Kitchen losartan (COZAAR) 50 MG tablet Take 1.5 tablets (75 mg total) by mouth daily. 45 tablet 7   No current facility-administered medications for this visit.    History   Social History  . Marital Status: Married    Spouse Name: Chloe Perkins  . Number of Children: 1  . Years of Education: N/A   Occupational History  . Retired    Social History Main Topics  . Smoking status: Former Smoker -- 0.50 packs/day for 20 years    Types: Cigarettes    Quit date: 02/09/1990  . Smokeless tobacco: Never Used  . Alcohol Use: No  . Drug Use: No  . Sexual Activity: Not on file   Other Topics Concern  . Not on file   Social History Narrative   She  lives her family, she has one child, retired, no smoke no drink    Family History  Problem Relation Age of Onset  . Emphysema Father     PGF, brother, sister  . COPD Father     PGF, brother, sister  . Rheum arthritis Mother   . Stroke Mother   . Heart disease Mother     Father, brother, sister  . Lung cancer Paternal Grandfather   . Ovarian cancer Paternal Aunt   . Breast cancer Cousin   . Cancer Brother     ROS General: Negative; No fevers, chills, or night sweats;  HEENT: Negative; No changes in vision or hearing, sinus congestion, difficulty swallowing Pulmonary: Negative; No cough, wheezing, shortness of breath, hemoptysis Cardiovascular: Negative; No chest pain, presyncope, syncope, palpitations GI: Negative; No nausea, vomiting, diarrhea, or abdominal pain GU: Negative; No dysuria, hematuria, or difficulty voiding Musculoskeletal:  she complains of chronic back pain.  Hematologic/Oncology: Negative; no easy bruising, bleeding Endocrine: Negative; no heat/cold intolerance; no diabetes Neuro: Negative; no changes in balance, headaches Skin: Negative; No rashes or skin lesions Psychiatric: Negative; No behavioral problems, depression Sleep: Negative; No snoring, daytime sleepiness, hypersomnolence, bruxism, restless legs, hypnogognic hallucinations, no cataplexy Other comprehensive 14 point system review is negative.   PE BP 138/80 mmHg  Pulse 50  Ht _0  (1.6 m)  Wt 117 lb 6.4 oz (53.252 kg)  BMI 20.80 kg/m2  Repeat BP 152/84 Wt Readings from Last 3 Encounters:  07/10/14 117 lb 6.4 oz (53.252 kg)  12/27/13 120 lb 3.2 oz (54.522 kg)  06/26/13 115 lb 12 oz (52.504 kg)   General: Alert, oriented, no distress.  Skin: normal turgor, no rashes HEENT: Normocephalic, atraumatic. Pupils round and reactive; sclera anicteric;no lid lag. Extraocular muscles intact;; no xanthelasmas. Nose without nasal septal hypertrophy Mouth/Parynx benign; Mallinpatti scale 2 Neck: No JVD, no carotid bruits; normal carotid upstroke Lungs: clear to ausculatation and percussion; no wheezing or rales Chest wall: no tenderness to palpitation; mild pectus excavatum Heart: RRR, s1 s2 normal; 1/6 systolic murmur.  no diastolic murmur, rub thrills or heaves Abdomen: soft, nontender; no hepatosplenomehaly, BS+; abdominal aorta nontender and not dilated by palpation. Back: no CVA tenderness Pulses 2+ Extremities: no clubbing cyanosis or edema, Homan's sign negative  Neurologic: grossly nonfocal; cranial nerves grossly normal. Psychologic: normal affect and mood.  ECG (independently read by me): sinus bradycardia 50 bpm.  Mild RV conduction delay  November 2015 ECG (independently read by me0;  Normal sinus rhythm at 77 bpm.  RV conduction delay.  Nondiagnostic small Q waves.  Nonspecific ST changes.  April 2015 ECG (independently read by  me): Sinus bradycardia 54 beats per minute.  Mild RV conduction delay.  No ectopy.  LABS: BMP Latest Ref Rng 12/13/2013 12/13/2013 06/26/2013  Glucose 70 - 99 mg/dL - 84 76  BUN 6 - 23 mg/dL - 14 18  Creatinine 0.4 - 1.2 mg/dL - 1.1 1.2  Sodium 135 - 145 mEq/L - 139 138  Potassium 3.5 - 5.1 mEq/L - 4.6 4.4  Chloride 96 - 112 mEq/L - 104 103  CO2 19 - 32 mEq/L - 28 28  Calcium 8.4 - 10.5 mg/dL 9.9 9.9 9.5   Hepatic Function Latest Ref Rng 06/26/2013 05/04/2013 06/28/2012  Total Protein 6.0 - 8.3 g/dL 7.1 6.9 7.0  Albumin 3.5 - 5.2 g/dL 4.2 4.1 3.9  AST 0 - 37 U/L _1 ALT 0 - 35 U/L _2 Alk Phosphatase 39 -  117 U/L 69 74 59  Total Bilirubin 0.2 - 1.2 mg/dL 0.2 0.4 0.5  Bilirubin, Direct 0.0 - 0.3 mg/dL - 0.0 0.1   CBC Latest Ref Rng 05/04/2013 05/25/2012 05/25/2012  WBC 4.5 - 10.5 K/uL 3.7(L) - 2.9(L)  Hemoglobin 12.0 - 15.0 g/dL 14.3 14.3 13.7  Hematocrit 36.0 - 46.0 % 42.4 42.0 39.4  Platelets 150.0 - 400.0 K/uL 201.0 - 141(L)   Lab Results  Component Value Date   MCV 94.7 05/04/2013   MCV 90.2 05/25/2012   MCV 93.5 05/23/2012   Lab Results  Component Value Date   TSH 0.81 06/26/2013  No results found for: HGBA1C  Lipid Panel     Component Value Date/Time   CHOL 202* 05/04/2013 0817   TRIG 47.0 05/04/2013 0817   HDL 83.40 05/04/2013 0817   CHOLHDL 2 05/04/2013 0817   VLDL 9.4 05/04/2013 0817   LDLCALC 109* 05/04/2013 0817   LDLDIRECT 102.2 04/23/2006 1211     RADIOLOGY: No results found.    ASSESSMENT AND PLAN: Ms. Daziyah Cogan is a 69 year old female who has a history of SVT as well as hypertension. Her blood pressure today is mildly elevated and I am recommending slight additional titration of her losartan to 75 mg daily.  I am recommending complete set of labs be obtained in the fasting state.  This would be helpful to reassess renal function.  She is bradycardic on her current dose of diltiazem at 120 mg daily.  She's not having any further episodes  of SVT.  Anxiety/depression seems to be controlled with her current dose of Wellbutrin XL 300 mg daily.  She is bradycardic but he denies any episodes of dizziness or presyncope.  Adjustments will be made for medical therapy depending upon laboratory results.  As long as she remains stable, I will see her in 6 months for cardiology reevaluation.    Time spent: 25 minutes  Troy Sine, MD, [facility]  07/10/2014 8:39 PM

## 2014-07-10 NOTE — Patient Instructions (Signed)
Your physician wants you to follow-up in: 6 Months You will receive a reminder letter in the mail two months in advance. If you don't receive a letter, please call our office to schedule the follow-up appointment.  Your physician has recommended you make the following change in your medication: Increase Losartan to 1 1/2 tablet daily and take diltiazem at bedtime  Your physician recommends that you return for lab work in: CBC, CMP, TSH, FASTING LIPIDS

## 2014-10-23 ENCOUNTER — Other Ambulatory Visit: Payer: Self-pay | Admitting: Sports Medicine

## 2014-10-23 DIAGNOSIS — M545 Low back pain: Secondary | ICD-10-CM

## 2014-10-25 ENCOUNTER — Encounter: Payer: Self-pay | Admitting: Internal Medicine

## 2014-11-04 ENCOUNTER — Ambulatory Visit
Admission: RE | Admit: 2014-11-04 | Discharge: 2014-11-04 | Disposition: A | Discharge status: Home / Self Care | Payer: Medicare Other | Source: Ambulatory Visit | Attending: Sports Medicine | Admitting: Sports Medicine

## 2014-11-04 DIAGNOSIS — M545 Low back pain: Secondary | ICD-10-CM

## 2014-11-08 ENCOUNTER — Encounter: Payer: Self-pay | Admitting: Family Medicine

## 2014-11-08 ENCOUNTER — Ambulatory Visit (INDEPENDENT_AMBULATORY_CARE_PROVIDER_SITE_OTHER): Payer: Medicare Other | Admitting: Family Medicine

## 2014-11-08 VITALS — BP 140/80 | HR 75 | Temp 98.1°F | Ht 64.0 in | Wt 118.1 lb

## 2014-11-08 DIAGNOSIS — H9202 Otalgia, left ear: Secondary | ICD-10-CM | POA: Diagnosis not present

## 2014-11-08 NOTE — Progress Notes (Signed)
Pre visit review using our clinic review tool, if applicable. No additional management support is needed unless otherwise documented below in the visit note. 

## 2014-11-08 NOTE — Patient Instructions (Signed)
Your exam was normal.  Use tramadol and tylenol as needed.  If it persists please let Dr. Derrel Nip know.  Take care and I hope you feel better.  Dr. Lacinda Axon

## 2014-11-08 NOTE — Assessment & Plan Note (Signed)
Normal exam today. The etiology of her ear pain is unclear. Advised PRN Tramadol (she already has this) and tylenol. Return if pain persists/worsens.

## 2014-11-08 NOTE — Progress Notes (Signed)
   Subjective:  Patient ID: Chloe Perkins, female    DOB: 10-27-1945  Age: 69 y.o. MRN: [id number]  CC: Ear pain (left)  HPI: 69 year old female presents to the clinic with complaints of left ear pain.  Left ear pain  Started 3 days ago.  She reports the pain is around the ear, "not inside".  Pain is moderated to severe and described as sharp.    Occurs intermittently but quite frequently.  She feels that this may be from using headphones during recent MRI.  No associated fever, chills, URI symptoms.  No relieving factors. She has tried tramadol.  No known exacerbating factors.   Social Hx  - Former smoker.  Review of Systems  Constitutional: Negative.   HENT: Positive for ear pain.    Objective:  BP 140/80 mmHg  Pulse 75  Temp(Src) 98.1 F (36.7 C) (Oral)  Ht 5\' 4"  (1.626 m)  Wt 118 lb 2 oz (53.581 kg)  BMI 20.27 kg/m2  SpO2 99%  BP/Weight 11/08/2014 07/10/2014 17/00/1749  Systolic BP 449 675 916  Diastolic BP 80 80 78  Wt. (Lbs) 118.13 117.4 120.2  BMI 20.27 20.8 20.62   Physical Exam  Constitutional: She is oriented to person, place, and time. She appears well-developed and well-nourished. No distress.  HENT:  Head: Normocephalic and atraumatic.  Normal TM's bilaterally.   Neck: Neck supple.  Cardiovascular: Normal rate and regular rhythm.   Pulmonary/Chest: Effort normal. No respiratory distress. She has no wheezes. She has no rales.  Lymphadenopathy:    She has no cervical adenopathy.  Neurological: She is alert and oriented to person, place, and time.  Psychiatric:  Depressed mood and affect.  Vitals reviewed.  Lab Results  Component Value Date   WBC 3.7* 05/04/2013   HGB 14.3 05/04/2013   HCT 42.4 05/04/2013   PLT 201.0 05/04/2013   GLUCOSE 84 12/13/2013   CHOL 202* 05/04/2013   TRIG 47.0 05/04/2013   HDL 83.40 05/04/2013   LDLDIRECT 102.2 04/23/2006   LDLCALC 109* 05/04/2013   ALT 13 06/26/2013   AST 25 06/26/2013   NA 139  12/13/2013   K 4.6 12/13/2013   CL 104 12/13/2013   CREATININE 1.1 12/13/2013   BUN 14 12/13/2013   CO2 28 12/13/2013   TSH 0.81 06/26/2013   MICROALBUR 0.3 12/13/2013   Assessment & Plan:   Problem List Items Addressed This Visit    Left ear pain - Primary    Normal exam today. The etiology of her ear pain is unclear. Advised PRN Tramadol (she already has this) and tylenol. Return if pain persists/worsens.        Follow-up: PRN.  Thersa Salt, DO

## 2014-11-22 ENCOUNTER — Telehealth: Payer: Self-pay | Admitting: Cardiovascular Disease

## 2014-11-22 NOTE — Telephone Encounter (Signed)
Labwork ordered previously, not yet drawn.  Printed order set for patient to take to [facility] or lab of choice. Confirmed home address to mail to patient via telephone call to patient.

## 2014-11-22 NOTE — Telephone Encounter (Signed)
Pt need lab order before appt on 12-12-14 please.

## 2014-11-28 ENCOUNTER — Telehealth: Payer: Self-pay | Admitting: Cardiovascular Disease

## 2014-11-28 DIAGNOSIS — Z79899 Other long term (current) drug therapy: Secondary | ICD-10-CM

## 2014-11-28 DIAGNOSIS — E785 Hyperlipidemia, unspecified: Secondary | ICD-10-CM

## 2014-11-28 DIAGNOSIS — I10 Essential (primary) hypertension: Secondary | ICD-10-CM

## 2014-11-28 DIAGNOSIS — E039 Hypothyroidism, unspecified: Secondary | ICD-10-CM

## 2014-11-28 LAB — CBC AND DIFFERENTIAL
HCT: 41 % (ref 36–46)
Hemoglobin: 14.3 g/dL (ref 12.0–16.0)
PLATELETS: 202 10*3/uL (ref 150–399)
WBC: 3.3 10*3/mL

## 2014-11-28 LAB — HEPATIC FUNCTION PANEL
ALT: 12 U/L (ref 7–35)
AST: 20 U/L (ref 13–35)
Alkaline Phosphatase: 91 U/L (ref 25–125)
Bilirubin, Total: 0.4 mg/dL

## 2014-11-28 LAB — BASIC METABOLIC PANEL
BUN: 16 mg/dL (ref 4–21)
Creatinine: 1 mg/dL (ref 0.5–1.1)
Glucose: 87 mg/dL
Potassium: 5.1 mmol/L (ref 3.4–5.3)
Sodium: 140 mmol/L (ref 137–147)

## 2014-11-28 NOTE — Telephone Encounter (Signed)
Mailed lab slip Patient did not do lab work 06/2014

## 2014-11-28 NOTE — Telephone Encounter (Signed)
Mrs.Barrick is calling because she is needing a lab order before her appt with Dr. Claiborne Billings, the appt is on Nov 1,2016 Please mail order to her . Please call if you have any questions .   Thanks

## 2014-11-29 ENCOUNTER — Encounter: Payer: Self-pay | Admitting: Internal Medicine

## 2014-12-03 ENCOUNTER — Telehealth: Payer: Self-pay | Admitting: Internal Medicine

## 2014-12-03 NOTE — Telephone Encounter (Signed)
Patient scheduled for CPE 12/26/14

## 2014-12-03 NOTE — Telephone Encounter (Signed)
Patient recently had labs done,  Nonfasting,  All were normal,  Needs to schedule an annual as I have not seen her in over a year

## 2014-12-11 ENCOUNTER — Encounter: Payer: Self-pay | Admitting: Cardiovascular Disease

## 2014-12-11 ENCOUNTER — Ambulatory Visit (INDEPENDENT_AMBULATORY_CARE_PROVIDER_SITE_OTHER): Payer: Medicare Other | Admitting: Cardiovascular Disease

## 2014-12-11 VITALS — BP 116/76 | HR 66 | Ht 63.0 in | Wt 116.9 lb

## 2014-12-11 DIAGNOSIS — F419 Anxiety disorder, unspecified: Secondary | ICD-10-CM

## 2014-12-11 DIAGNOSIS — E785 Hyperlipidemia, unspecified: Secondary | ICD-10-CM

## 2014-12-11 DIAGNOSIS — F329 Major depressive disorder, single episode, unspecified: Secondary | ICD-10-CM

## 2014-12-11 DIAGNOSIS — F418 Other specified anxiety disorders: Secondary | ICD-10-CM

## 2014-12-11 DIAGNOSIS — F32A Depression, unspecified: Secondary | ICD-10-CM

## 2014-12-11 DIAGNOSIS — R002 Palpitations: Secondary | ICD-10-CM

## 2014-12-11 DIAGNOSIS — R011 Cardiac murmur, unspecified: Secondary | ICD-10-CM | POA: Diagnosis not present

## 2014-12-11 DIAGNOSIS — I471 Supraventricular tachycardia: Secondary | ICD-10-CM | POA: Diagnosis not present

## 2014-12-11 DIAGNOSIS — I10 Essential (primary) hypertension: Secondary | ICD-10-CM | POA: Diagnosis not present

## 2014-12-11 LAB — CBC
HCT: 42.5 % (ref 36.0–46.0)
Hemoglobin: 14.3 g/dL (ref 12.0–15.0)
MCH: 32.1 pg (ref 26.0–34.0)
MCHC: 33.6 g/dL (ref 30.0–36.0)
MCV: 95.5 fL (ref 78.0–100.0)
MPV: 10.6 fL (ref 8.6–12.4)
PLATELETS: 229 10*3/uL (ref 150–400)
RBC: 4.45 MIL/uL (ref 3.87–5.11)
RDW: 12.9 % (ref 11.5–15.5)
WBC: 5 10*3/uL (ref 4.0–10.5)

## 2014-12-11 NOTE — Patient Instructions (Signed)
Your physician has requested that you have an echocardiogram. Echocardiography is a painless test that uses sound waves to create images of your heart. It provides your doctor with information about the size and shape of your heart and how well your heart's chambers and valves are working. This procedure takes approximately one hour. There are no restrictions for this procedure.  Your physician wants you to follow-up in: 6 months or sooner if needed. You will receive a reminder letter in the mail two months in advance. If you don't receive a letter, please call our office to schedule the follow-up appointment.  If you need a refill on your cardiac medications before your next appointment, please call your pharmacy.    

## 2014-12-11 NOTE — Progress Notes (Signed)
Patient ID: Chloe Perkins, female   DOB: Feb 20, 1945, 69 y.o.   MRN: [id number]     HPI: Chloe Perkins is a 69 y.o. female who presents to the office for a 6 month cardiology follow-up evaluation.  Chloe Perkins has a history of SVT and hypertension, for which she has been on diltiazem at 120 mg  and losartan 50 mg daily.  She has  also remote history of thyroid abnormality and in the past  had taken levothyroxine. She remains fairly active. She walks. She has mild renal insufficiency with stage III renal disease.  Has a history of anxiety/depression for which she takes Wellbutrin XL 300 mg daily.  She also is on calcium with vitamin D in light of osteoporosis.  Presently, she denies chest pain.  She denies PND or orthopnea.  She denies any recurrent tachypalpitations.  She has experienced chronic back pain.  She was concerned about potential risk for silent heart attack.  Apparently her mother had suffered a silent MI.  The patient admits to occasional episodes of lightheadedness.  She denies syncope.  She is unaware of significant palpitations.  She presents for evaluation.   Past Medical History  Diagnosis Date  . Hypertension   . Mitral valve prolapse   . HLD (hyperlipidemia)   . Hemorrhoids   . Osteoporosis   . Personal history of malignant melanoma of skin   . Viral gastroenteritis   . Anxiety and depression   . History of melanoma   . Asthmatic bronchitis   . SVT (supraventricular tachycardia) [facility])     Past Surgical History  Procedure Laterality Date  . Endometrial ablation  1976  . Melanoma excision  1999    superficial spreading melanoma right post shoulder  . Left thumb tendon repair  10/09  . Abdominal hysterectomy  1975  . Colonoscopy  04/15/2004    2006: Normal  . Wrist surgery Left 2011  . Skin cancer excision  03/02/2013    superficial basal cell cancer  . US echocardiography  10/16/2010    mitral valve leaflets midly thickened,trace MR.  Marland Kitchen Nm myocar perf wall  motion  12/28/2008    No ischemia    Allergies  Allergen Reactions  . Alendronate Sodium     REACTION: pt states INTOL to Fosamax  . Verapamil     REACTION: Intol w/ bradycardia    Current Outpatient Prescriptions  Medication Sig Dispense Refill  . buPROPion (WELLBUTRIN XL) 300 MG 24 hr tablet Take 300 mg by mouth daily.    . calcium-vitamin D (OSCAL WITH D) 500-200 MG-UNIT per tablet Take 1 tablet by mouth 2 (two) times daily.      . diclofenac (VOLTAREN) 75 MG EC tablet as needed.  0  . diltiazem (CARTIA XT) 120 MG 24 hr capsule TAKE 1 CAPSULE BY MOUTH AT BEDTIME 30 capsule 7  . FLUVIRIN SUSP   0  . LORazepam (ATIVAN) 0.5 MG tablet Take 0.5 mg by mouth 2 (two) times daily.     Marland Kitchen losartan (COZAAR) 50 MG tablet Take 1.5 tablets (75 mg total) by mouth daily. 45 tablet 7  . traMADol (ULTRAM) 50 MG tablet as needed.  0   No current facility-administered medications for this visit.    Social History   Social History  . Marital Status: Married    Spouse Name: sammy Mac  . Number of Children: 1  . Years of Education: N/A   Occupational History  . Retired    Science writer  History Main Topics  . Smoking status: Former Smoker -- 0.50 packs/day for 20 years    Types: Cigarettes    Quit date: 02/09/1990  . Smokeless tobacco: Never Used  . Alcohol Use: No  . Drug Use: No  . Sexual Activity: Not on file   Other Topics Concern  . Not on file   Social History Narrative   She lives her family, she has one child, retired, no smoke no drink    Family History  Problem Relation Age of Onset  . Emphysema Father     PGF, brother, sister  . COPD Father     PGF, brother, sister  . Rheum arthritis Mother   . Stroke Mother   . Heart disease Mother     Father, brother, sister  . Lung cancer Paternal Grandfather   . Ovarian cancer Paternal Aunt   . Breast cancer Cousin   . Cancer Brother     ROS General: Negative; No fevers, chills, or night sweats;  HEENT: Negative; No  changes in vision or hearing, sinus congestion, difficulty swallowing Pulmonary: Negative; No cough, wheezing, shortness of breath, hemoptysis Cardiovascular: Negative; No chest pain, presyncope, syncope, palpitations GI: Negative; No nausea, vomiting, diarrhea, or abdominal pain GU: Negative; No dysuria, hematuria, or difficulty voiding Musculoskeletal: she complains of chronic back pain.  Hematologic/Oncology: Negative; no easy bruising, bleeding Endocrine: Negative; no heat/cold intolerance; no diabetes Neuro: Negative; no changes in balance, headaches Skin: Negative; No rashes or skin lesions Psychiatric: Positive for anxiety/depression on Wellbutrin Sleep: Negative; No snoring, daytime sleepiness, hypersomnolence, bruxism, restless legs, hypnogognic hallucinations, no cataplexy Other comprehensive 14 point system review is negative.   PE BP 116/76 mmHg  Pulse 66  Ht _0  (1.6 m)  Wt 116 lb 14.4 oz (53.025 kg)  BMI 20.71 kg/m2  Repeat BP 128/78 supine and 126/78 standing.  No significant orthostatic pulse rise.  Wt Readings from Last 3 Encounters:  12/11/14 116 lb 14.4 oz (53.025 kg)  11/08/14 118 lb 2 oz (53.581 kg)  07/10/14 117 lb 6.4 oz (53.252 kg)   General: Alert, oriented, no distress.  Skin: normal turgor, no rashes HEENT: Normocephalic, atraumatic. Pupils round and reactive; sclera anicteric;no lid lag. Extraocular muscles intact;; no xanthelasmas. Nose without nasal septal hypertrophy Mouth/Parynx benign; Mallinpatti scale 2 Neck: No JVD, no carotid bruits; normal carotid upstroke Lungs: clear to ausculatation and percussion; no wheezing or rales Chest wall: no tenderness to palpitation; mild pectus excavatum Heart: RRR, s1 s2 normal; 6-7/6 systolic murmur in the aortic region; no diastolic murmur, rub thrills or heaves Abdomen: soft, nontender; no hepatosplenomehaly, BS+; abdominal aorta nontender and not dilated by palpation. Back: no CVA tenderness Pulses  2+ Extremities: no clubbing cyanosis or edema, Homan's sign negative  Neurologic: grossly nonfocal; cranial nerves grossly normal. Psychologic: normal affect and mood.  ECG (independently read by me): Normal sinus rhythm at 66 bpm.  Mild RV conduction delay.  Nondiagnostic small inferior Q waves.  ECG (independently read by me): sinus bradycardia 50 bpm.  Mild RV conduction delay  November 2015 ECG (independently read by me0;  Normal sinus rhythm at 77 bpm.  RV conduction delay.  Nondiagnostic small Q waves.  Nonspecific ST changes.  April 2015 ECG (independently read by me): Sinus bradycardia 54 beats per minute.  Mild RV conduction delay.  No ectopy.  LABS: BMP Latest Ref Rng 11/28/2014 12/13/2013 12/13/2013  Glucose 70 - 99 mg/dL - - 84  BUN 4 - 21 mg/dL 16 - 14  Creatinine 0.5 - 1.1 mg/dL 1.0 - 1.1  Sodium 137 - 147 mmol/L 140 - 139  Potassium 3.4 - 5.3 mmol/L 5.1 - 4.6  Chloride 96 - 112 mEq/L - - 104  CO2 19 - 32 mEq/L - - 28  Calcium 8.4 - 10.5 mg/dL - 9.9 9.9   Hepatic Function Latest Ref Rng 11/28/2014 06/26/2013 05/04/2013  Total Protein 6.0 - 8.3 g/dL - 7.1 6.9  Albumin 3.5 - 5.2 g/dL - 4.2 4.1  AST 13 - 35 U/L _0 ALT 7 - 35 U/L _1 Alk Phosphatase 25 - 125 U/L 91 69 74  Total Bilirubin 0.2 - 1.2 mg/dL - 0.2 0.4  Bilirubin, Direct 0.0 - 0.3 mg/dL - - 0.0   CBC Latest Ref Rng 11/28/2014 05/04/2013 05/25/2012  WBC - 3.3 3.7(L) -  Hemoglobin 12.0 - 16.0 g/dL 14.3 14.3 14.3  Hematocrit 36 - 46 % 41 42.4 42.0  Platelets 150 - 399 K/L 202 201.0 -   Lab Results  Component Value Date   MCV 94.7 05/04/2013   MCV 90.2 05/25/2012   MCV 93.5 05/23/2012   Lab Results  Component Value Date   TSH 0.81 06/26/2013  No results found for: HGBA1C  Lipid Panel     Component Value Date/Time   CHOL 202* 05/04/2013 0817   CHOL 203* 05/22/2011 0759   TRIG 47.0 05/04/2013 0817   TRIG 65 05/22/2011 0759   HDL 83.40 05/04/2013 0817   HDL 88* 05/22/2011 0759   CHOLHDL  2 05/04/2013 0817   VLDL 9.4 05/04/2013 0817   VLDL 13 05/22/2011 0759   LDLCALC 109* 05/04/2013 0817   LDLCALC 102* 05/22/2011 0759   LDLDIRECT 102.2 04/23/2006 1211     RADIOLOGY: No results found.    ASSESSMENT AND PLAN: Chloe Perkins is a 69 year old female who has a history of SVT as well as hypertension.  When I last saw her, her blood pressure was mildly elevated and I further titrated her losartan to 75 mg daily.  She also is on diltiazem slow release 120 mg.  Her blood pressure today is controlled.  She is not orthostatic.  She has experienced very rare episodes of isolated palpitation and very rare episodes of dizziness.   Anxiety/depression seems to be controlled with her current dose of Wellbutrin XL 300 mg daily.  Her ECG shows sinus rhythm with rates in the 60s with mild RV conduction delay.  She was concerned that her mother had suffered a silent heart attack and was wondering about how we would know if she had suffered one herself. With her cardiac murmur, I am recommending that she undergo a 2-D echo Doppler study for further evaluation.  Her last echo Doppler study was in 2012.  Prior to coming to the office, she had blood work done in the fasting state this morning.  These results are not yet available for my review.  I will contact her if adjustments need to be made to her medical regimen.  I will contact her regarding her echo Doppler study and if stable, I will see her in 6 months for reevaluation.  If her echo Doppler study shows abnormality requiring close follow-up  I will then see her for further evaluation.      Time spent: 25 minutes  Troy Sine, MD, [facility]  12/11/2014 2:21 PM

## 2014-12-12 LAB — LIPID PANEL
Cholesterol: 216 mg/dL — ABNORMAL HIGH (ref 125–200)
HDL: 85 mg/dL (ref >=46)
LDL CALC: 116 mg/dL (ref <130)
TRIGLYCERIDES: 75 mg/dL (ref <150)
Total CHOL/HDL Ratio: 2.5 Ratio (ref <=5.0)
VLDL: 15 mg/dL (ref <30)

## 2014-12-12 LAB — COMPREHENSIVE METABOLIC PANEL
ALT: 15 U/L (ref 6–29)
AST: 19 U/L (ref 10–35)
Albumin: 3.9 g/dL (ref 3.6–5.1)
Alkaline Phosphatase: 80 U/L (ref 33–130)
BUN: 15 mg/dL (ref 7–25)
CHLORIDE: 105 mmol/L (ref 98–110)
CO2: 27 mmol/L (ref 20–31)
CREATININE: 1.03 mg/dL — AB (ref 0.60–0.93)
Calcium: 9.2 mg/dL (ref 8.6–10.4)
GLUCOSE: 90 mg/dL (ref 65–99)
Potassium: 4.9 mmol/L (ref 3.5–5.3)
SODIUM: 139 mmol/L (ref 135–146)
TOTAL PROTEIN: 6.4 g/dL (ref 6.1–8.1)
Total Bilirubin: 0.5 mg/dL (ref 0.2–1.2)

## 2014-12-12 LAB — TSH: TSH: 3.372 u[IU]/mL (ref 0.350–4.500)

## 2014-12-17 ENCOUNTER — Telehealth: Payer: Self-pay | Admitting: Cardiovascular Disease

## 2014-12-17 NOTE — Telephone Encounter (Signed)
Patient called and notified that MD has not viewed results. She will be notified when there is a result note. She voiced understanding.

## 2014-12-17 NOTE — Telephone Encounter (Signed)
Patient would like lab results from last week.

## 2014-12-20 ENCOUNTER — Other Ambulatory Visit: Payer: Self-pay

## 2014-12-20 ENCOUNTER — Ambulatory Visit (HOSPITAL_COMMUNITY): Payer: Medicare Other | Attending: Cardiology

## 2014-12-20 DIAGNOSIS — Z87891 Personal history of nicotine dependence: Secondary | ICD-10-CM | POA: Insufficient documentation

## 2014-12-20 DIAGNOSIS — R011 Cardiac murmur, unspecified: Secondary | ICD-10-CM | POA: Insufficient documentation

## 2014-12-20 DIAGNOSIS — I10 Essential (primary) hypertension: Secondary | ICD-10-CM

## 2014-12-25 ENCOUNTER — Telehealth: Payer: Self-pay | Admitting: Cardiovascular Disease

## 2014-12-25 MED ORDER — LOSARTAN POTASSIUM 50 MG PO TABS: 75.0000 mg | ORAL_TABLET | Freq: Every day | ORAL | Status: DC

## 2014-12-25 MED ORDER — DILTIAZEM HCL ER COATED BEADS 120 MG PO CP24: ORAL_CAPSULE | ORAL | Status: DC

## 2014-12-25 NOTE — Telephone Encounter (Signed)
Sent to [facility] in results box; but echo good Nl LV fxn ; mild turbulence below AV as cauuse of murmur; labs good x chol 216, LDL 116, HDL excellent.

## 2014-12-25 NOTE — Telephone Encounter (Signed)
Lab results and echo results reported to patient, meds refilled at her request. All questions answered to pt satisfaction. No further concerns.

## 2014-12-25 NOTE — Telephone Encounter (Signed)
Pt would like her lab results from 12-11-14-and echo results from 12-20-14 please.

## 2014-12-26 ENCOUNTER — Encounter: Payer: Medicare Other | Admitting: Internal Medicine

## 2014-12-27 ENCOUNTER — Telehealth: Payer: Self-pay | Admitting: *Deleted

## 2014-12-27 NOTE — Telephone Encounter (Signed)
-----   Message from Troy Sine, MD sent at 12/25/2014  1:02 PM EST ----- NL LV fxn; no major valve abnl; There is turbulent flow at the base of the aortic valve, right coronary cusp (likely cause of murmur).

## 2014-12-27 NOTE — Telephone Encounter (Signed)
Patient called and informed of echo and lab results.patient voiced understanding.

## 2015-01-18 ENCOUNTER — Ambulatory Visit: Payer: Medicare Other | Admitting: Internal Medicine

## 2015-01-29 ENCOUNTER — Other Ambulatory Visit (HOSPITAL_COMMUNITY): Payer: Self-pay | Admitting: Psychiatry

## 2015-02-07 ENCOUNTER — Ambulatory Visit (INDEPENDENT_AMBULATORY_CARE_PROVIDER_SITE_OTHER): Payer: Medicare Other | Admitting: Nurse Practitioner

## 2015-02-07 ENCOUNTER — Encounter: Payer: Self-pay | Admitting: Nurse Practitioner

## 2015-02-07 VITALS — BP 120/80 | HR 64 | Temp 99.2°F | Resp 14 | Wt 120.0 lb

## 2015-02-07 DIAGNOSIS — J069 Acute upper respiratory infection, unspecified: Secondary | ICD-10-CM | POA: Insufficient documentation

## 2015-02-07 DIAGNOSIS — B9789 Other viral agents as the cause of diseases classified elsewhere: Secondary | ICD-10-CM

## 2015-02-07 DIAGNOSIS — R300 Dysuria: Secondary | ICD-10-CM | POA: Insufficient documentation

## 2015-02-07 LAB — POCT URINALYSIS DIPSTICK
Bilirubin, UA: NEGATIVE
GLUCOSE UA: NEGATIVE
Ketones, UA: NEGATIVE
Nitrite, UA: NEGATIVE
PROTEIN UA: NEGATIVE
Spec Grav, UA: 1.025
UROBILINOGEN UA: 0.2
pH, UA: 6

## 2015-02-07 MED ORDER — GUAIFENESIN-CODEINE 100-10 MG/5ML PO SYRP: 5.0000 mL | ORAL_SOLUTION | Freq: Every day | ORAL | Status: DC

## 2015-02-07 NOTE — Assessment & Plan Note (Signed)
1 day of symptoms POCT urine and symptoms are somewhat possible for UTI, but will obtain culture before prescribing abx. Will follow. Azo recommended for symptoms

## 2015-02-07 NOTE — Progress Notes (Signed)
Patient ID: Chloe Perkins, female    DOB: April 22, 1945  Age: 69 y.o. MRN: [id number]  CC: Cough and Dysuria   HPI Chloe Perkins presents for CC of cough/congestion x 2 days and dysuria x 1 day.   1) Cough- mucous white, Sinus pressure frontal  Sick contacts- denies  OTC cough syrup for children   2) UTI- Frequency, urgency, dysuria "sent chills up my body when I peed".   History Chloe Perkins has a past medical history of Hypertension; Mitral valve prolapse; HLD (hyperlipidemia); Hemorrhoids; Osteoporosis; Personal history of malignant melanoma of skin; Viral gastroenteritis; Anxiety and depression; History of melanoma; Asthmatic bronchitis; and SVT (supraventricular tachycardia) ([place]).   Chloe Perkins has past surgical history that includes Endometrial ablation (1976); Melanoma excision (1999); left thumb tendon repair (10/09); Abdominal hysterectomy (1975); Colonoscopy (04/15/2004); Wrist surgery (Left, 2011); Skin cancer excision (03/02/2013); US ECHOCARDIOGRAPHY (10/16/2010); and NM MYOCAR PERF WALL MOTION (12/28/2008).   Her family history includes Breast cancer in her cousin; COPD in her father; Cancer in her brother; Emphysema in her father; Heart disease in her mother; Lung cancer in her paternal grandfather; Ovarian cancer in her paternal aunt; Rheum arthritis in her mother; Stroke in her mother.Chloe Perkins reports that Chloe Perkins quit smoking about 25 years ago. Her smoking use included Cigarettes. Chloe Perkins has a 10 pack-year smoking history. Chloe Perkins has never used smokeless tobacco. Chloe Perkins reports that Chloe Perkins does not drink alcohol or use illicit drugs.  Outpatient Prescriptions Prior to Visit  Medication Sig Dispense Refill  . buPROPion (WELLBUTRIN XL) 300 MG 24 hr tablet Take 300 mg by mouth daily.    . calcium-vitamin D (OSCAL WITH D) 500-200 MG-UNIT per tablet Take 1 tablet by mouth 2 (two) times daily.      . diclofenac (VOLTAREN) 75 MG EC tablet as needed.  0  . diltiazem (CARTIA XT) 120 MG 24 hr capsule TAKE 1 CAPSULE  BY MOUTH AT BEDTIME 30 capsule 7  . LORazepam (ATIVAN) 0.5 MG tablet Take 0.5 mg by mouth 2 (two) times daily.     Marland Kitchen losartan (COZAAR) 50 MG tablet Take 1.5 tablets (75 mg total) by mouth daily. 45 tablet 7  . traMADol (ULTRAM) 50 MG tablet as needed.  0  . FLUVIRIN SUSP   0   No facility-administered medications prior to visit.    ROS Review of Systems  Constitutional: Negative for fever, chills, diaphoresis and fatigue.  HENT: Positive for congestion, postnasal drip, rhinorrhea, sinus pressure, sneezing and sore throat. Negative for ear pain.   Eyes: Negative for visual disturbance.  Respiratory: Positive for cough. Negative for chest tightness, shortness of breath and wheezing.   Gastrointestinal: Negative for nausea, vomiting and diarrhea.  Genitourinary: Positive for dysuria, urgency and frequency. Negative for hematuria, flank pain, decreased urine volume, difficulty urinating and pelvic pain.  Skin: Negative for rash.    Objective:  BP 120/80 mmHg  Pulse 64  Temp(Src) 99.2 F (37.3 C) (Oral)  Resp 14  Wt 120 lb (54.432 kg)  SpO2 96%  Physical Exam  Constitutional: Chloe Perkins is oriented to person, place, and time. Chloe Perkins appears well-developed and well-nourished. No distress.  HENT:  Head: Normocephalic and atraumatic.  Right Ear: External ear normal.  Left Ear: External ear normal.  Mouth/Throat: No oropharyngeal exudate.  Oropharynx and nasal mucosa edematous TMs clear bilaterally  Eyes: EOM are normal. Pupils are equal, round, and reactive to light. Right eye exhibits no discharge. Left eye exhibits no discharge. No scleral icterus.  Neck: Normal range of  motion. Neck supple.  Cardiovascular: Normal rate, regular rhythm and normal heart sounds.  Exam reveals no gallop and no friction rub.   No murmur heard. Pulmonary/Chest: Effort normal and breath sounds normal. No respiratory distress. Chloe Perkins has no wheezes. Chloe Perkins has no rales. Chloe Perkins exhibits no tenderness.  Lymphadenopathy:     Chloe Perkins has no cervical adenopathy.  Neurological: Chloe Perkins is alert and oriented to person, place, and time. No cranial nerve deficit. Chloe Perkins exhibits normal muscle tone. Coordination normal.  Skin: Skin is warm and dry. No rash noted. Chloe Perkins is not diaphoretic.  Psychiatric: Chloe Perkins has a normal mood and affect. Her behavior is normal. Judgment and thought content normal.   Assessment & Plan:   Chloe Perkins was seen today for cough and dysuria.  Diagnoses and all orders for this visit:  Dysuria -     POCT urinalysis dipstick -     Urine culture  Viral URI with cough  Other orders -     Cancel: chlorpheniramine-HYDROcodone (TUSSIONEX PENNKINETIC ER) 10-8 MG/5ML SUER; Take 5 mLs by mouth at bedtime as needed for cough. -     guaiFENesin-codeine (ROBITUSSIN AC) 100-10 MG/5ML syrup; Take 5 mLs by mouth at bedtime.   I have discontinued Chloe Perkins FLUVIRIN. I am also having her start on guaiFENesin-codeine. Additionally, I am having her maintain her calcium-vitamin D, LORazepam, buPROPion, traMADol, diclofenac, diltiazem, and losartan.  Meds ordered this encounter  Medications  . guaiFENesin-codeine (ROBITUSSIN AC) 100-10 MG/5ML syrup    Sig: Take 5 mLs by mouth at bedtime.    Dispense:  180 mL    Refill:  0    Order Specific Question:  Supervising Provider    Answer:  [name] [[id number]     Follow-up: Return if symptoms worsen or fail to improve.

## 2015-02-07 NOTE — Addendum Note (Signed)
Addended by: Nanci Pina on: 02/07/2015 01:37 PM   Modules accepted: Miquel Dunn

## 2015-02-07 NOTE — Assessment & Plan Note (Signed)
New onset Robitussin AC printed, signed, and given to pt to take to the pharmacy for symptoms. Instructions on how to take given verbally and on AVS.  Mucinex plain and flonase OTC advised.  FU prn worsening/failure to improve.

## 2015-02-07 NOTE — Patient Instructions (Addendum)
Mucinex plain- helps thin mucous and move it out  Robitussin or Delsym cough syrup (plain kinds) over the counter to help with cough during the day  Flonase- 2 sprays each nostril  5 mL (1 teaspoon) of cough syrup. Don't drive or make important decisions while taking this....just sleep and rest.   We will call with urine culture results.   Azo OTC for chills when urinating

## 2015-02-09 LAB — URINE CULTURE
COLONY COUNT: NO GROWTH
ORGANISM ID, BACTERIA: NO GROWTH

## 2015-02-14 ENCOUNTER — Telehealth: Payer: Self-pay | Admitting: *Deleted

## 2015-02-14 NOTE — Telephone Encounter (Signed)
Spoke with patient and verbalized Urine culture results.

## 2015-02-14 NOTE — Telephone Encounter (Signed)
Patient has requested a return call in regards to lab result on 02/07/15.

## 2015-03-18 ENCOUNTER — Ambulatory Visit
Admission: RE | Admit: 2015-03-18 | Discharge: 2015-03-18 | Disposition: A | Discharge status: Home / Self Care | Payer: Medicare Other | Source: Ambulatory Visit | Attending: Internal Medicine | Admitting: Internal Medicine

## 2015-03-18 ENCOUNTER — Other Ambulatory Visit: Payer: Self-pay | Admitting: Internal Medicine

## 2015-03-18 ENCOUNTER — Encounter: Payer: Self-pay | Admitting: Internal Medicine

## 2015-03-18 ENCOUNTER — Telehealth: Payer: Self-pay

## 2015-03-18 ENCOUNTER — Ambulatory Visit (INDEPENDENT_AMBULATORY_CARE_PROVIDER_SITE_OTHER): Payer: Medicare Other | Admitting: Internal Medicine

## 2015-03-18 VITALS — BP 130/84 | HR 59 | Temp 97.9°F | Resp 12 | Ht 63.0 in | Wt 119.5 lb

## 2015-03-18 DIAGNOSIS — E559 Vitamin D deficiency, unspecified: Secondary | ICD-10-CM

## 2015-03-18 DIAGNOSIS — Z1239 Encounter for other screening for malignant neoplasm of breast: Secondary | ICD-10-CM

## 2015-03-18 DIAGNOSIS — R9389 Abnormal findings on diagnostic imaging of other specified body structures: Secondary | ICD-10-CM

## 2015-03-18 DIAGNOSIS — R05 Cough: Secondary | ICD-10-CM | POA: Diagnosis not present

## 2015-03-18 DIAGNOSIS — Z0000 Encounter for general adult medical examination without abnormal findings: Secondary | ICD-10-CM

## 2015-03-18 DIAGNOSIS — I951 Orthostatic hypotension: Secondary | ICD-10-CM | POA: Diagnosis not present

## 2015-03-18 DIAGNOSIS — M810 Age-related osteoporosis without current pathological fracture: Secondary | ICD-10-CM

## 2015-03-18 DIAGNOSIS — Z0001 Encounter for general adult medical examination with abnormal findings: Secondary | ICD-10-CM | POA: Diagnosis not present

## 2015-03-18 DIAGNOSIS — I471 Supraventricular tachycardia: Secondary | ICD-10-CM

## 2015-03-18 DIAGNOSIS — Z23 Encounter for immunization: Secondary | ICD-10-CM | POA: Diagnosis not present

## 2015-03-18 DIAGNOSIS — N183 Chronic kidney disease, stage 3 unspecified: Secondary | ICD-10-CM

## 2015-03-18 DIAGNOSIS — R42 Dizziness and giddiness: Secondary | ICD-10-CM

## 2015-03-18 DIAGNOSIS — R059 Cough, unspecified: Secondary | ICD-10-CM

## 2015-03-18 DIAGNOSIS — I10 Essential (primary) hypertension: Secondary | ICD-10-CM

## 2015-03-18 LAB — BASIC METABOLIC PANEL
BUN: 16 mg/dL (ref 6–23)
CALCIUM: 10.4 mg/dL (ref 8.4–10.5)
CO2: 29 mEq/L (ref 19–32)
Chloride: 103 mEq/L (ref 96–112)
Creatinine, Ser: 1.02 mg/dL (ref 0.40–1.20)
GFR: 56.81 mL/min — AB (ref >60.00)
GLUCOSE: 98 mg/dL (ref 70–99)
Potassium: 4.8 mEq/L (ref 3.5–5.1)
SODIUM: 139 meq/L (ref 135–145)

## 2015-03-18 LAB — VITAMIN B12: VITAMIN B 12: 186 pg/mL — AB (ref 211–911)

## 2015-03-18 LAB — VITAMIN D 25 HYDROXY (VIT D DEFICIENCY, FRACTURES): VITD: 71.23 ng/mL (ref 30.00–100.00)

## 2015-03-18 MED ORDER — TETANUS-DIPHTH-ACELL PERTUSSIS 5-2.5-18.5 LF-MCG/0.5 IM SUSP: 0.5000 mL | Freq: Once | INTRAMUSCULAR | Status: DC

## 2015-03-18 NOTE — Telephone Encounter (Signed)
Chloe Perkins from [facility] states that the chest xray nodular density projects over the spine on the lateral view and the radiologist is recommending an order for CT of the chest w/o contrast. Please advise

## 2015-03-18 NOTE — Patient Instructions (Signed)
Your chest x ray has been ordered .   There is no sign of pneumonia or bronchitis on your exam, however.  Your persistent cough may be due to "Post nasal drip"  YOu can try adding benadryl (dipenhydramine, 12.5 to 25 mg) taken at bedtime .  You can continue daytime  Zyrtec for allergies if it helps your sneezing  Please use   Neil's Sinus rinse to flush your sinuses twice daily;  This helps prevent congestion and infection from viruses  And bacteria that you come into contact with  I will prescribe a prednisone taper to help the resolve the inflammation   Menopause is a normal process in which your reproductive ability comes to an end. This process happens gradually over a span of months to years, usually between the ages of 71 and 64. Menopause is complete when you have missed 12 consecutive menstrual periods. It is important to talk with your health care provider about some of the most common conditions that affect postmenopausal women, such as heart disease, cancer, and bone loss (osteoporosis). Adopting a healthy lifestyle and getting preventive care can help to promote your health and wellness. Those actions can also lower your chances of developing some of these common conditions. WHAT SHOULD I KNOW ABOUT MENOPAUSE? During menopause, you may experience a number of symptoms, such as:  Moderate-to-severe hot flashes.  Night sweats.  Decrease in sex drive.  Mood swings.  Headaches.  Tiredness.  Irritability.  Memory problems.  Insomnia. Choosing to treat or not to treat menopausal changes is an individual decision that you make with your health care provider. WHAT SHOULD I KNOW ABOUT HORMONE REPLACEMENT THERAPY AND SUPPLEMENTS? Hormone therapy products are effective for treating symptoms that are associated with menopause, such as hot flashes and night sweats. Hormone replacement carries certain risks, especially as you become older. If you are thinking about using estrogen or  estrogen with progestin treatments, discuss the benefits and risks with your health care provider. WHAT SHOULD I KNOW ABOUT HEART DISEASE AND STROKE? Heart disease, heart attack, and stroke become more likely as you age. This may be due, in part, to the hormonal changes that your body experiences during menopause. These can affect how your body processes dietary fats, triglycerides, and cholesterol. Heart attack and stroke are both medical emergencies. There are many things that you can do to help prevent heart disease and stroke:  Have your blood pressure checked at least every 1-2 years. High blood pressure causes heart disease and increases the risk of stroke.  If you are 71-71 years old, ask your health care provider if you should take aspirin to prevent a heart attack or a stroke.  Do not use any tobacco products, including cigarettes, chewing tobacco, or electronic cigarettes. If you need help quitting, ask your health care provider.  It is important to eat a healthy diet and maintain a healthy weight.  Be sure to include plenty of vegetables, fruits, low-fat dairy products, and lean protein.  Avoid eating foods that are high in solid fats, added sugars, or salt (sodium).  Get regular exercise. This is one of the most important things that you can do for your health.  Try to exercise for at least 150 minutes each week. The type of exercise that you do should increase your heart rate and make you sweat. This is known as moderate-intensity exercise.  Try to do strengthening exercises at least twice each week. Do these in addition to the moderate-intensity exercise.  Know your numbers.Ask your health care provider to check your cholesterol and your blood glucose. Continue to have your blood tested as directed by your health care provider. WHAT SHOULD I KNOW ABOUT CANCER SCREENING? There are several types of cancer. Take the following steps to reduce your risk and to catch any cancer  development as early as possible. Breast Cancer  Practice breast self-awareness.  This means understanding how your breasts normally appear and feel.  It also means doing regular breast self-exams. Let your health care provider know about any changes, no matter how small.  If you are 71 or older, have a clinician do a breast exam (clinical breast exam or CBE) every year. Depending on your age, family history, and medical history, it may be recommended that you also have a yearly breast X-ray (mammogram).  If you have a family history of breast cancer, talk with your health care provider about genetic screening.  If you are at high risk for breast cancer, talk with your health care provider about having an MRI and a mammogram every year.  Breast cancer (BRCA) gene test is recommended for women who have family members with BRCA-related cancers. Results of the assessment will determine the need for genetic counseling and BRCA1 and for BRCA2 testing. BRCA-related cancers include these types:  Breast. This occurs in males or females.  Ovarian.  Tubal. This may also be called fallopian tube cancer.  Cancer of the abdominal or pelvic lining (peritoneal cancer).  Prostate.  Pancreatic. Cervical, Uterine, and Ovarian Cancer Your health care provider may recommend that you be screened regularly for cancer of the pelvic organs. These include your ovaries, uterus, and vagina. This screening involves a pelvic exam, which includes checking for microscopic changes to the surface of your cervix (Pap test).  For women ages 71-65, health care providers may recommend a pelvic exam and a Pap test every three years. For women ages 71-65, they may recommend the Pap test and pelvic exam, combined with testing for human papilloma virus (HPV), every five years. Some types of HPV increase your risk of cervical cancer. Testing for HPV may also be done on women of any age who have unclear Pap test  results.  Other health care providers may not recommend any screening for nonpregnant women who are considered low risk for pelvic cancer and have no symptoms. Ask your health care provider if a screening pelvic exam is right for you.  If you have had past treatment for cervical cancer or a condition that could lead to cancer, you need Pap tests and screening for cancer for at least 20 years after your treatment. If Pap tests have been discontinued for you, your risk factors (such as having a new sexual partner) need to be reassessed to determine if you should start having screenings again. Some women have medical problems that increase the chance of getting cervical cancer. In these cases, your health care provider may recommend that you have screening and Pap tests more often.  If you have a family history of uterine cancer or ovarian cancer, talk with your health care provider about genetic screening.  If you have vaginal bleeding after reaching menopause, tell your health care provider.  There are currently no reliable tests available to screen for ovarian cancer. Lung Cancer Lung cancer screening is recommended for adults 24-34 years old who are at high risk for lung cancer because of a history of smoking. A yearly low-dose CT scan of the lungs is recommended  if you:  Currently smoke.  Have a history of at least 30 pack-years of smoking and you currently smoke or have quit within the past 15 years. A pack-year is smoking an average of one pack of cigarettes per day for one year. Yearly screening should:  Continue until it has been 15 years since you quit.  Stop if you develop a health problem that would prevent you from having lung cancer treatment. Colorectal Cancer  This type of cancer can be detected and can often be prevented.  Routine colorectal cancer screening usually begins at age 2 and continues through age 24.  If you have risk factors for colon cancer, your health care  provider may recommend that you be screened at an earlier age.  If you have a family history of colorectal cancer, talk with your health care provider about genetic screening.  Your health care provider may also recommend using home test kits to check for hidden blood in your stool.  A small camera at the end of a tube can be used to examine your colon directly (sigmoidoscopy or colonoscopy). This is done to check for the earliest forms of colorectal cancer.  Direct examination of the colon should be repeated every 5-10 years until age 53. However, if early forms of precancerous polyps or small growths are found or if you have a family history or genetic risk for colorectal cancer, you may need to be screened more often. Skin Cancer  Check your skin from head to toe regularly.  Monitor any moles. Be sure to tell your health care provider:  About any new moles or changes in moles, especially if there is a change in a mole's shape or color.  If you have a mole that is larger than the size of a pencil eraser.  If any of your family members has a history of skin cancer, especially at a young age, talk with your health care provider about genetic screening.  Always use sunscreen. Apply sunscreen liberally and repeatedly throughout the day.  Whenever you are outside, protect yourself by wearing long sleeves, pants, a wide-brimmed hat, and sunglasses. WHAT SHOULD I KNOW ABOUT OSTEOPOROSIS? Osteoporosis is a condition in which bone destruction happens more quickly than new bone creation. After menopause, you may be at an increased risk for osteoporosis. To help prevent osteoporosis or the bone fractures that can happen because of osteoporosis, the following is recommended:  If you are 41-44 years old, get at least 1,000 mg of calcium and at least 600 mg of vitamin D per day.  If you are older than age 44 but younger than age 15, get at least 1,200 mg of calcium and at least 600 mg of vitamin D  per day.  If you are older than age 25, get at least 1,200 mg of calcium and at least 800 mg of vitamin D per day. Smoking and excessive alcohol intake increase the risk of osteoporosis. Eat foods that are rich in calcium and vitamin D, and do weight-bearing exercises several times each week as directed by your health care provider. WHAT SHOULD I KNOW ABOUT HOW MENOPAUSE AFFECTS [place]? Depression may occur at any age, but it is more common as you become older. Common symptoms of depression include:  Low or sad mood.  Changes in sleep patterns.  Changes in appetite or eating patterns.  Feeling an overall lack of motivation or enjoyment of activities that you previously enjoyed.  Frequent crying spells. Talk with your  health care provider if you think that you are experiencing depression. WHAT SHOULD I KNOW ABOUT IMMUNIZATIONS? It is important that you get and maintain your immunizations. These include:  Tetanus, diphtheria, and pertussis (Tdap) booster vaccine.  Influenza every year before the flu season begins.  Pneumonia vaccine.  Shingles vaccine. Your health care provider may also recommend other immunizations.   This information is not intended to replace advice given to you by your health care provider. Make sure you discuss any questions you have with your health care provider.   Document Released: 03/20/2005 Document Revised: 02/16/2014 Document Reviewed: 09/28/2013 Elsevier Interactive Patient Education [employer].

## 2015-03-18 NOTE — Progress Notes (Signed)
Patient ID: Chloe Perkins, female    DOB: 1945/11/01  Age: 70 y.o. MRN: [id number]  The patient is here for annual Medicare wellness examination and management of other chronic and acute problems.  S/p hysterectomy Mammogram jan 2016 colonoscopy 2006 osteoporosis treated with alendronate for a year developed gastritis after a year.  Last DEXA 2013.   risk factors are reflected in the social history.  The roster of all physicians providing medical care to patient - is listed in the Snapshot section of the chart.  Activities of daily living:  The patient is 100% independent in all ADLs: dressing, toileting, feeding as well as independent mobility  Home safety : The patient has smoke detectors in the home. They wear seatbelts.  There are no firearms at home. There is no violence in the home.   There is no risks for hepatitis, STDs or HIV. There is no   history of blood transfusion. They have no travel history to infectious disease endemic areas of the world.  The patient has seen their dentist in the last six month. They have seen their eye doctor in the last year. They admit to slight hearing difficulty with regard to whispered voices and some television programs.  They have deferred audiologic testing in the last year.  They do not  have excessive sun exposure. Discussed the need for sun protection: hats, long sleeves and use of sunscreen if there is significant sun exposure.   Diet: the importance of a healthy diet is discussed. They do have a healthy diet.  The benefits of regular aerobic exercise were discussed. She walks 4 times per week ,  20 minutes.   Depression screen: there are no signs or vegative symptoms of depression- irritability, change in appetite, anhedonia, sadness/tearfullness.  Cognitive assessment: the patient manages all their financial and personal affairs and is actively engaged. They could relate day,date,year and events; recalled 2/3 objects at 3 minutes;  performed clock-face test normally.  The following portions of the patient's history were reviewed and updated as appropriate: allergies, current medications, past family history, past medical history,  past surgical history, past social history  and problem list.  Visual acuity was not assessed per patient preference since she has regular follow up with her ophthalmologist. Hearing and body mass index were assessed and reviewed.   During the course of the visit the patient was educated and counseled about appropriate screening and preventive services including : fall prevention , diabetes screening, nutrition counseling, colorectal cancer screening, and recommended immunizations.    CC: The primary encounter diagnosis was Screening for breast cancer. Diagnoses of Vitamin D deficiency, Orthostasis, Dizziness and giddiness, Cough, Osteoporosis, Need for prophylactic vaccination against Streptococcus pneumoniae (pneumococcus), Essential hypertension, SVT (supraventricular tachycardia) (Chloe Perkins), CKD (chronic kidney disease) stage 3, GFR 30-59 ml/min, and Encounter for preventive health examination were also pertinent to this visit. She was last seen by me in May 2015, has had several evaluations by other providers in office which were reviewed with patient  today in light of her current voiced dissatisfaction with treatment plan and ongoing  concerns:    1) Cough:  Treated by Morey Hummingbird in late December for cough and congestion  Which started 2 days prior to presentation .   Was treated for viral URI and UA with culture was also  done for additional c/o bilateral flank pain .  Was not having dysuria . Urine culture was negative.  Patient states that her respiratory symptoms became much worse,  Lasted  two weeks,  But did not call the office or seek additional care elsewhere. Continues to have intermittent cough.     2) Screening for CAD:  patient had workup as outlined below,  State that she never received the  results from cardiology:   EKG and  ECHO Nov 2016 by Claiborne Billings: . Chart says she was called on two separate occasions,  Nov 15 and  Nov 17th by Charlotte Sanes and by Mariann Laster  (staff) and told that they were  normal. Patient states that "they didn't know what the results meant ." Patient has NOT been having chest pain or angina equivalent.   q waves inferior leads considered nondiagnostic by Dr Claiborne Billings  EF 60 to 65%  Aortic valve has turbulent flow at base of right coronary cusp. Otherwise normal   Normal stress test  In 2010   3) Has been having recurrent  dizziness for the last several weeks .   Most Severe episode started while lying in bed Saturday mornng  Became much worse when she sat up,   Felt pre syncopal but did not faint.  Denies nausea, but was not hungry all day,  felt "bad" all day .  Has frequent palpitations. So not sure if they were worse that day.  Does not drink more than 8 ounces of water daily.    Home bps have been 150 high  and as low as [id number]  Systolic ,  Pulse has been in the low 50s  Up to 65.  Taking diltiazem at bedtime and the losartan in the morning. Dose was recently increased to   75 mg daily .     History Chloe Perkins has a past medical history of Hypertension; Mitral valve prolapse; HLD (hyperlipidemia); Hemorrhoids; Osteoporosis; Personal history of malignant melanoma of skin; Viral gastroenteritis; Anxiety and depression; History of melanoma; Asthmatic bronchitis; and SVT (supraventricular tachycardia) (Chloe Perkins).   She has past surgical history that includes Endometrial ablation (1976); Melanoma excision (1999); left thumb tendon repair (10/09); Abdominal hysterectomy (1975); Colonoscopy (04/15/2004); Wrist surgery (Left, 2011); Skin cancer excision (03/02/2013); US ECHOCARDIOGRAPHY (10/16/2010); and NM MYOCAR PERF WALL MOTION (12/28/2008).   Her family history includes Breast cancer in her cousin; COPD in her father; Cancer in her brother; Emphysema in her father; Heart disease in her  mother; Lung cancer in her paternal grandfather; Ovarian cancer in her paternal aunt; Rheum arthritis in her mother; Stroke in her mother.She reports that she quit smoking about 25 years ago. Her smoking use included Cigarettes. She has a 10 pack-year smoking history. She has never used smokeless tobacco. She reports that she does not drink alcohol or use illicit drugs.  Outpatient Prescriptions Prior to Visit  Medication Sig Dispense Refill  . buPROPion (WELLBUTRIN XL) 300 MG 24 hr tablet Take 300 mg by mouth daily.    . calcium-vitamin D (OSCAL WITH D) 500-200 MG-UNIT per tablet Take 1 tablet by mouth 2 (two) times daily.      Marland Kitchen diltiazem (CARTIA XT) 120 MG 24 hr capsule TAKE 1 CAPSULE BY MOUTH AT BEDTIME 30 capsule 7  . LORazepam (ATIVAN) 0.5 MG tablet Take 0.5 mg by mouth 2 (two) times daily.     Marland Kitchen losartan (COZAAR) 50 MG tablet Take 1.5 tablets (75 mg total) by mouth daily. 45 tablet 7  . diclofenac (VOLTAREN) 75 MG EC tablet as needed. Reported on 03/18/2015  0  . guaiFENesin-codeine (ROBITUSSIN AC) 100-10 MG/5ML syrup Take 5 mLs by mouth at bedtime. 180 mL 0  .  traMADol (ULTRAM) 50 MG tablet as needed.  0   No facility-administered medications prior to visit.    Review of Systems   Patient denies headache, fevers, malaise, unintentional weight loss, skin rash, eye pain, sinus congestion and sinus pain, sore throat, dysphagia,  hemoptysis , \ dyspnea, wheezing, chest pain,, orthopnea, edema, abdominal pain, nausea, melena, diarrhea, constipation, flank pain, dysuria, hematuria, urinary  Frequency, nocturia, numbness, tingling, seizures,  Focal weakness, Loss of consciousness,  Tremor, insomnia, depression, anxiety, and suicidal ideation.     Objective:  BP 130/84 mmHg  Pulse 59  Temp(Src) 97.9 F (36.6 C) (Oral)  Resp 12  Ht 5\' 3"  (1.6 m)  Wt 119 lb 8 oz (54.205 kg)  BMI 21.17 kg/m2  SpO2 97%  Physical Exam   General appearance: alert, cooperative and appears stated age Head:  Normocephalic, without obvious abnormality, atraumatic Eyes: conjunctivae/corneas clear. PERRL, EOM's intact. Fundi benign. Ears: normal TM's and external ear canals both ears Nose: Nares normal. Septum midline. Mucosa normal. No drainage or sinus tenderness. Throat: lips, mucosa, and tongue normal; teeth and gums normal Neck: no adenopathy, no carotid bruit, no JVD, supple, symmetrical, trachea midline and thyroid not enlarged, symmetric, no tenderness/mass/nodules Lungs: clear to auscultation bilaterally Breasts: normal appearance, no masses or tenderness Heart: regular rate and rhythm, S1, S2 normal, no murmur, click, rub or gallop Abdomen: soft, non-tender; bowel sounds normal; no masses,  no organomegaly Extremities: extremities normal, atraumatic, no cyanosis or edema Pulses: 2+ and symmetric Skin: Skin color, texture, turgor normal. No rashes or lesions Neurologic: Alert and oriented X 3, normal strength and tone. Normal symmetric reflexes. Normal coordination and gait.     Assessment & Plan:   Problem List Items Addressed This Visit    Essential hypertension    Elevated only in supine position.  Given the 20 pt drop with upright position and the symptoms of dizziness,  No changes were made today.  Lab Results  Component Value Date   CREATININE 1.02 03/18/2015   Lab Results  Component Value Date   NA 139 03/18/2015   K 4.8 03/18/2015   CL 103 03/18/2015   CO2 29 03/18/2015         Osteoporosis    She deferred treatment last year.  Repeat DEXA advised and Prolia recommended. History of gastritis with alendronate       Relevant Orders   DG Bone Density   SVT (supraventricular tachycardia) (HCC)    Managed with diltiazem.  Most recetn ehco and EKG Nov 2016 reviewed with patient today.       CKD (chronic kidney disease) stage 3, GFR 30-59 ml/min    Repeat labs show stable cr and normal electrolytes.  Vit D is nigh normal.     Lab Results  Component Value Date    CREATININE 1.02 03/18/2015         Orthostasis   Relevant Orders   Basic metabolic panel (Completed)   Dizziness and giddiness    Patient is a difficult historian due to anxiety/attitude,  so it is not clear if the orthostasis that was noted on today's exam is the entire source of her symptoms.  She has a history of melanoma, and a new pulmonary nodule on chest x ray.  She reports chronic dizziness with tilting head back and to my knowledge has no history of carotid stenosis or PAD.  She was advised to increase her water intake given signs of dehydration on exam (tenting, orthostatic drop in BP  and slight bump in Cr) .  She denies , to the best of my attempts to get a good description , a history of true vertigo and has no sinus symptoms.       Relevant Orders   B12 (Completed)   Cough    Lung exam is clear, but given chronicity of cough and remote history of tobacco abuse ,  Chest x ray was done.  Solitary nodule was found,  patient notified, and chest CT has been ordered. Will likely need pulmonary consult and PFTS      Relevant Orders   DG Chest 2 View (Completed)   Encounter for preventive health examination    Annual comprehensive preventive exam was done as well as an evaluation and management of chronic conditions .  A total of 60 minutes was spent with patient in face to face time today due to the multitude of issues raised by patient.   During the course of the visit the patient was educated and counseled about appropriate screening and preventive services including :  diabetes screening, lipid analysis with projected  10 year  risk for CAD , nutrition counseling, breast, cervical and colorectal cancer screening, and recommended immunizations.  Printed recommendations for health maintenance screenings was given       Other Visit Diagnoses    Screening for breast cancer    -  Primary    Relevant Orders    MM DIGITAL SCREENING BILATERAL    Vitamin D deficiency        Relevant  Orders    VITAMIN D 25 Hydroxy (Vit-D Deficiency, Fractures) (Completed)    Need for prophylactic vaccination against Streptococcus pneumoniae (pneumococcus)        Relevant Orders    Pneumococcal conjugate vaccine 13-valent (Completed)       I have discontinued Ms. Danford's traMADol, diclofenac, and guaiFENesin-codeine. I am also having her start on Tdap. Additionally, I am having her maintain her calcium-vitamin D, LORazepam, buPROPion, diltiazem, and losartan.  Meds ordered this encounter  Medications  . Tdap (BOOSTRIX) 5-2.5-18.5 LF-MCG/0.5 injection    Sig: Inject 0.5 mLs into the muscle once.    Dispense:  0.5 mL    Refill:  0    Medications Discontinued During This Encounter  Medication Reason  . diclofenac (VOLTAREN) 75 MG EC tablet Completed Course  . guaiFENesin-codeine (ROBITUSSIN AC) 100-10 MG/5ML syrup Completed Course  . traMADol (ULTRAM) 50 MG tablet Completed Course    Follow-up: Return in about 4 weeks (around 04/15/2015).   [name], MD

## 2015-03-18 NOTE — Progress Notes (Signed)
Pre-visit discussion using our clinic review tool. No additional management support is needed unless otherwise documented below in the visit note.  

## 2015-03-18 NOTE — Telephone Encounter (Signed)
CT chest has been ordered and patient has been advised.

## 2015-03-19 ENCOUNTER — Ambulatory Visit (INDEPENDENT_AMBULATORY_CARE_PROVIDER_SITE_OTHER): Payer: Medicare Other | Admitting: Surgical

## 2015-03-19 ENCOUNTER — Telehealth: Payer: Self-pay

## 2015-03-19 ENCOUNTER — Telehealth: Payer: Self-pay | Admitting: Surgical

## 2015-03-19 DIAGNOSIS — M810 Age-related osteoporosis without current pathological fracture: Secondary | ICD-10-CM | POA: Insufficient documentation

## 2015-03-19 DIAGNOSIS — R42 Dizziness and giddiness: Secondary | ICD-10-CM | POA: Insufficient documentation

## 2015-03-19 DIAGNOSIS — R05 Cough: Secondary | ICD-10-CM | POA: Insufficient documentation

## 2015-03-19 DIAGNOSIS — I951 Orthostatic hypotension: Secondary | ICD-10-CM | POA: Insufficient documentation

## 2015-03-19 DIAGNOSIS — E538 Deficiency of other specified B group vitamins: Secondary | ICD-10-CM

## 2015-03-19 DIAGNOSIS — Z0000 Encounter for general adult medical examination without abnormal findings: Secondary | ICD-10-CM | POA: Insufficient documentation

## 2015-03-19 DIAGNOSIS — R059 Cough, unspecified: Secondary | ICD-10-CM | POA: Insufficient documentation

## 2015-03-19 MED ORDER — CYANOCOBALAMIN 1000 MCG/ML IJ SOLN
1000.0000 ug | Freq: Once | INTRAMUSCULAR | Status: AC
  Administered 2015-03-19: 1000 ug via INTRAMUSCULAR

## 2015-03-19 NOTE — Telephone Encounter (Signed)
Pt wants to know about nodule that was found on chest xray. She was told that Juliann Pulse would go over her report with her when she came in for her B12 shot today. Please advise, thanks

## 2015-03-19 NOTE — Telephone Encounter (Signed)
Patient came in for nurse visit and was asking about her xray results. She is wanting to get more clarification on the nodule location. Can you please give the patient a call about this.

## 2015-03-19 NOTE — Assessment & Plan Note (Signed)
Managed with diltiazem.  Most recetn ehco and EKG Nov 2016 reviewed with patient today.

## 2015-03-19 NOTE — Assessment & Plan Note (Addendum)
Annual comprehensive preventive exam was done as well as an evaluation and management of chronic conditions .  A total of 60 minutes was spent with patient in face to face time today due to the multitude of issues raised by patient.   During the course of the visit the patient was educated and counseled about appropriate screening and preventive services including :  diabetes screening, lipid analysis with projected  10 year  risk for CAD , nutrition counseling, breast, cervical and colorectal cancer screening, and recommended immunizations.  Printed recommendations for health maintenance screenings was given

## 2015-03-19 NOTE — Assessment & Plan Note (Signed)
Repeat labs show stable cr and normal electrolytes.  Vit D is nigh normal.     Lab Results  Component Value Date   CREATININE 1.02 03/18/2015

## 2015-03-19 NOTE — Assessment & Plan Note (Addendum)
Patient is a difficult historian due to anxiety/attitude,  so it is not clear if the orthostasis that was noted on today's exam is the entire source of her symptoms.  She has a history of melanoma, and a new pulmonary nodule on chest x ray.  She reports chronic dizziness with tilting head back and to my knowledge has no history of carotid stenosis or PAD.  She was advised to increase her water intake given signs of dehydration on exam (tenting, orthostatic drop in BP  and slight bump in Cr) .  She denies , to the best of my attempts to get a good description , a history of true vertigo and has no sinus symptoms.

## 2015-03-19 NOTE — Assessment & Plan Note (Signed)
Lung exam is clear, but given chronicity of cough and remote history of tobacco abuse ,  Chest x ray was done.  Solitary nodule was found,  patient notified, and chest CT has been ordered. Will likely need pulmonary consult and PFTS

## 2015-03-19 NOTE — Progress Notes (Signed)
Patient was given B12 injection in right deltoid. Patient tolerated.

## 2015-03-19 NOTE — Assessment & Plan Note (Addendum)
She deferred treatment last year.  Repeat DEXA advised and Prolia recommended. History of gastritis with alendronate

## 2015-03-19 NOTE — Assessment & Plan Note (Signed)
Elevated only in supine position.  Given the 20 pt drop with upright position and the symptoms of dizziness,  No changes were made today.  Lab Results  Component Value Date   CREATININE 1.02 03/18/2015   Lab Results  Component Value Date   NA 139 03/18/2015   K 4.8 03/18/2015   CL 103 03/18/2015   CO2 29 03/18/2015

## 2015-03-20 ENCOUNTER — Ambulatory Visit: Payer: Medicare Other

## 2015-03-20 NOTE — Telephone Encounter (Signed)
Patient notified that the reason could not tell where the nodule is located is undetermined and needs further eval with CT scan.

## 2015-03-22 ENCOUNTER — Telehealth: Payer: Self-pay

## 2015-03-22 ENCOUNTER — Ambulatory Visit
Admission: RE | Admit: 2015-03-22 | Discharge: 2015-03-22 | Disposition: A | Discharge status: Home / Self Care | Payer: Medicare Other | Source: Ambulatory Visit | Attending: Internal Medicine | Admitting: Internal Medicine

## 2015-03-22 DIAGNOSIS — Z85820 Personal history of malignant melanoma of skin: Secondary | ICD-10-CM | POA: Insufficient documentation

## 2015-03-22 DIAGNOSIS — R938 Abnormal findings on diagnostic imaging of other specified body structures: Secondary | ICD-10-CM | POA: Diagnosis not present

## 2015-03-22 DIAGNOSIS — I700 Atherosclerosis of aorta: Secondary | ICD-10-CM | POA: Insufficient documentation

## 2015-03-22 DIAGNOSIS — Z87891 Personal history of nicotine dependence: Secondary | ICD-10-CM | POA: Insufficient documentation

## 2015-03-22 DIAGNOSIS — R911 Solitary pulmonary nodule: Secondary | ICD-10-CM | POA: Diagnosis not present

## 2015-03-22 DIAGNOSIS — R05 Cough: Secondary | ICD-10-CM | POA: Insufficient documentation

## 2015-03-22 DIAGNOSIS — R9389 Abnormal findings on diagnostic imaging of other specified body structures: Secondary | ICD-10-CM

## 2015-03-22 NOTE — Telephone Encounter (Signed)
Pt is requesting her test results, Please advise, thanks

## 2015-03-25 ENCOUNTER — Ambulatory Visit (INDEPENDENT_AMBULATORY_CARE_PROVIDER_SITE_OTHER): Payer: Medicare Other

## 2015-03-25 DIAGNOSIS — E538 Deficiency of other specified B group vitamins: Secondary | ICD-10-CM

## 2015-03-25 MED ORDER — CYANOCOBALAMIN 1000 MCG/ML IJ SOLN
1000.0000 ug | Freq: Once | INTRAMUSCULAR | Status: AC
  Administered 2015-03-25: 1000 ug via INTRAMUSCULAR

## 2015-03-25 NOTE — Progress Notes (Signed)
Patient came in for B12 injection.  Received in Left deltoid.  Patient tolerated well.  

## 2015-03-26 ENCOUNTER — Ambulatory Visit: Payer: Medicare Other

## 2015-04-01 ENCOUNTER — Other Ambulatory Visit: Payer: Self-pay | Admitting: Internal Medicine

## 2015-04-01 ENCOUNTER — Ambulatory Visit
Admission: RE | Admit: 2015-04-01 | Discharge: 2015-04-01 | Disposition: A | Discharge status: Home / Self Care | Payer: Medicare Other | Source: Ambulatory Visit | Attending: Internal Medicine | Admitting: Internal Medicine

## 2015-04-01 ENCOUNTER — Ambulatory Visit (INDEPENDENT_AMBULATORY_CARE_PROVIDER_SITE_OTHER): Payer: Medicare Other

## 2015-04-01 DIAGNOSIS — M810 Age-related osteoporosis without current pathological fracture: Secondary | ICD-10-CM | POA: Diagnosis not present

## 2015-04-01 DIAGNOSIS — Z13820 Encounter for screening for osteoporosis: Secondary | ICD-10-CM | POA: Diagnosis present

## 2015-04-01 DIAGNOSIS — Z1231 Encounter for screening mammogram for malignant neoplasm of breast: Secondary | ICD-10-CM | POA: Diagnosis present

## 2015-04-01 DIAGNOSIS — Z1239 Encounter for other screening for malignant neoplasm of breast: Secondary | ICD-10-CM

## 2015-04-01 DIAGNOSIS — E538 Deficiency of other specified B group vitamins: Secondary | ICD-10-CM | POA: Diagnosis not present

## 2015-04-01 HISTORY — DX: Malignant (primary) neoplasm, unspecified: C80.1

## 2015-04-01 MED ORDER — CYANOCOBALAMIN 1000 MCG/ML IJ SOLN
1000.0000 ug | Freq: Once | INTRAMUSCULAR | Status: AC
  Administered 2015-04-01: 1000 ug via INTRAMUSCULAR

## 2015-04-01 NOTE — Progress Notes (Signed)
Patient came in for B12 injection.  Received in Left deltoid.  Patient tolerated well.  

## 2015-04-02 ENCOUNTER — Ambulatory Visit: Payer: Medicare Other

## 2015-04-03 ENCOUNTER — Telehealth: Payer: Self-pay | Admitting: Internal Medicine

## 2015-04-03 NOTE — Telephone Encounter (Signed)
Pt called to check the status of her Bone density test she had done on Monday 04/01/2015. Call pt @ [phone]. Thank you!

## 2015-04-15 ENCOUNTER — Ambulatory Visit: Payer: Medicare Other | Admitting: Internal Medicine

## 2015-04-30 ENCOUNTER — Ambulatory Visit: Payer: Medicare Other

## 2015-05-01 ENCOUNTER — Ambulatory Visit (INDEPENDENT_AMBULATORY_CARE_PROVIDER_SITE_OTHER): Payer: Medicare Other | Admitting: Internal Medicine

## 2015-05-01 ENCOUNTER — Encounter: Payer: Self-pay | Admitting: Internal Medicine

## 2015-05-01 VITALS — BP 138/72 | HR 94 | Temp 98.0°F | Resp 12 | Ht 63.0 in | Wt 118.5 lb

## 2015-05-01 DIAGNOSIS — R05 Cough: Secondary | ICD-10-CM | POA: Diagnosis not present

## 2015-05-01 DIAGNOSIS — E538 Deficiency of other specified B group vitamins: Secondary | ICD-10-CM

## 2015-05-01 DIAGNOSIS — R059 Cough, unspecified: Secondary | ICD-10-CM

## 2015-05-01 DIAGNOSIS — M810 Age-related osteoporosis without current pathological fracture: Secondary | ICD-10-CM | POA: Diagnosis not present

## 2015-05-01 DIAGNOSIS — R911 Solitary pulmonary nodule: Secondary | ICD-10-CM

## 2015-05-01 NOTE — Progress Notes (Signed)
Subjective:  Patient ID: Chloe Perkins, female    DOB: 1945/08/23  Age: 70 y.o. MRN: [id number]  CC: The primary encounter diagnosis was Osteoporosis. Diagnoses of Vitamin B 12 deficiency, Cough, Solitary pulmonary nodule, and B12 deficiency were also pertinent to this visit.  HPI ANQUANETTE BIONDO presents for follow up on DEXA and CT chest to eval for cough in former smoker wit histor of melanoma   CT chest reviewed with patient  All questions answered,  Stability of pulmonary  nodule suggested and prior evaluations reviewed.  DEXA scna results showing osteoporosis dicussed.  All available forms of therapy discussed.  Outpatient Prescriptions Prior to Visit  Medication Sig Dispense Refill  . buPROPion (WELLBUTRIN XL) 300 MG 24 hr tablet Take 300 mg by mouth daily.    . calcium-vitamin D (OSCAL WITH D) 500-200 MG-UNIT per tablet Take 1 tablet by mouth 2 (two) times daily.      Marland Kitchen diltiazem (CARTIA XT) 120 MG 24 hr capsule TAKE 1 CAPSULE BY MOUTH AT BEDTIME 30 capsule 7  . LORazepam (ATIVAN) 0.5 MG tablet Take 0.5 mg by mouth 2 (two) times daily.     Marland Kitchen losartan (COZAAR) 50 MG tablet Take 1.5 tablets (75 mg total) by mouth daily. 45 tablet 7  . Tdap (BOOSTRIX) 5-2.5-18.5 LF-MCG/0.5 injection Inject 0.5 mLs into the muscle once. (Patient not taking: Reported on 05/01/2015) 0.5 mL 0   No facility-administered medications prior to visit.    Review of Systems;  Patient denies headache, fevers, malaise, unintentional weight loss, skin rash, eye pain, sinus congestion and sinus pain, sore throat, dysphagia,  hemoptysis , cough, dyspnea, wheezing, chest pain, palpitations, orthopnea, edema, abdominal pain, nausea, melena, diarrhea, constipation, flank pain, dysuria, hematuria, urinary  Frequency, nocturia, numbness, tingling, seizures,  Focal weakness, Loss of consciousness,  Tremor, insomnia, depression, anxiety, and suicidal ideation.      Objective:  BP 138/72 mmHg  Pulse 94  Temp(Src)  98 F (36.7 C) (Oral)  Resp 12  Ht 5\' 3"  (1.6 m)  Wt 118 lb 8 oz (53.751 kg)  BMI 21.00 kg/m2  SpO2 98%  BP Readings from Last 3 Encounters:  05/01/15 138/72  03/18/15 130/84  02/07/15 120/80    Wt Readings from Last 3 Encounters:  05/01/15 118 lb 8 oz (53.751 kg)  03/18/15 119 lb 8 oz (54.205 kg)  02/07/15 120 lb (54.432 kg)    General appearance: alert, cooperative and appears stated age Ears: normal TM's and external ear canals both ears Throat: lips, mucosa, and tongue normal; teeth and gums normal Neck: no adenopathy, no carotid bruit, supple, symmetrical, trachea midline and thyroid not enlarged, symmetric, no tenderness/mass/nodules Back: symmetric, no curvature. ROM normal. No CVA tenderness. Lungs: clear to auscultation bilaterally Heart: regular rate and rhythm, S1, S2 normal, no murmur, click, rub or gallop    No results found for: HGBA1C  Lab Results  Component Value Date   CREATININE 1.02 03/18/2015   CREATININE 1.03* 12/11/2014   CREATININE 1.0 11/28/2014    Lab Results  Component Value Date   WBC 5.0 12/11/2014   HGB 14.3 12/11/2014   HCT 42.5 12/11/2014   PLT 229 12/11/2014   GLUCOSE 98 03/18/2015   CHOL 216* 12/11/2014   TRIG 75 12/11/2014   HDL 85 12/11/2014   LDLDIRECT 102.2 04/23/2006   LDLCALC 116 12/11/2014   ALT 15 12/11/2014   AST 19 12/11/2014   NA 139 03/18/2015   K 4.8 03/18/2015   CL 103 03/18/2015  CREATININE 1.02 03/18/2015   BUN 16 03/18/2015   CO2 29 03/18/2015   TSH 3.372 12/11/2014   MICROALBUR 0.3 12/13/2013    Dg Bone Density  04/01/2015  EXAM: DUAL X-RAY ABSORPTIOMETRY (DXA) FOR BONE MINERAL DENSITY IMPRESSION: Dear Dr. Deborra Medina, Your patient [name] completed a BMD test on 04/01/2015 using the [place] (analysis version: 14.10) manufactured by [employer]. The following summarizes the results of our evaluation. PATIENT BIOGRAPHICAL: Name: Chloe, Perkins Patient ID: [id number] Birth  Date: 03-Dec-1945 Height: 63.0 in. Gender: Female Exam Date: 04/01/2015 Weight: 117.2 lbs. Indications: Advanced Age, Caucasian, History of Fracture (Adult), Hysterectomy, Postmenopausal Fractures: coccyx, left hand, Pelvis Treatments: CALCIUM VIT D ASSESSMENT: The BMD measured at Femur Neck Right is 0.613 g/cm2 with a T-score of -3.1. This patient is considered osteoporotic according to [facility]) criteria. Site Region Measured Measured WHO Young Adult BMD Date       Age      Classification T-score AP Spine L1-L4 3/01/2015 70.8 Osteopenia -1.2 1.044 g/cm2 AP Spine L1-L4 02/08/2012 67.6 Osteopenia -1.3 1.032 g/cm2 DualFemur Neck Right 04/01/2015 70.8 Osteoporosis -3.1 0.613 g/cm2 DualFemur Neck Right 02[age]08/2012 67.6 Osteoporosis -3.0 0.621 g/cm2 World Health Organization [facility]) criteria for post-menopausal, Caucasian Women: Normal:       T-score at or above -1 SD Osteopenia:   T-score between -1 and -2.5 SD Osteoporosis: T-score at or below -2.5 SD RECOMMENDATIONS: [place] recommends that FDA-approved medical therapies be considered in postmenopausal women and men age 37 or older with a: 1. Hip or vertebral (clinical or morphometric) fracture. 2. T-score of < -2.5 at the spine or hip. 3. Ten-year fracture probability by FRAX of 3% or greater for hip fracture or 20% or greater for major osteoporotic fracture. All treatment decisions require clinical judgment and consideration of individual patient factors, including patient preferences, co-morbidities, previous drug use, risk factors not captured in the FRAX model (e.g. falls, vitamin D deficiency, increased bone turnover, interval significant decline in bone density) and possible under - or over-estimation of fracture risk by FRAX. All patients should ensure an adequate intake of dietary calcium (1200 mg/d) and vitamin D (800 IU daily) unless contraindicated. FOLLOW-UP: People with diagnosed cases of osteoporosis or at  high risk for fracture should have regular bone mineral density tests. For patients eligible for Medicare, routine testing is allowed once every 2 years. The testing frequency can be increased to one year for patients who have rapidly progressing disease, those who are receiving or discontinuing medical therapy to restore bone mass, or have additional risk factors. I have reviewed this report, and agree with the above findings. [facility] Electronically Signed   By: David  Martinique M.D.   On: 04/01/2015 13:21   Mm Screening Breast Tomo Bilateral  04/02/2015  CLINICAL DATA:  Screening. EXAM: DIGITAL SCREENING BILATERAL MAMMOGRAM WITH 3D TOMO WITH CAD COMPARISON:  Previous exam(s). ACR Breast Density Category c: The breast tissue is heterogeneously dense, which may obscure small masses. FINDINGS: There are no findings suspicious for malignancy. Images were processed with CAD. IMPRESSION: No mammographic evidence of malignancy. A result letter of this screening mammogram will be mailed directly to the patient. RECOMMENDATION: Screening mammogram in one year. (Code:SM-B-01Y) BI-RADS CATEGORY  1: Negative. Electronically Signed   By: Marin Olp M.D.   On: 04/02/2015 08:03    Assessment & Plan:   Problem List Items Addressed This Visit    Osteoporosis - Primary    She continues  to defer treatment  Due to concern of side effects of new medications and  History of gastritis with alendronate. .  Calcium, exercise and vitamin d requirements reviewed. No history of fractures.          Cough    Her prlonged cough has finally resolved.       Solitary pulmonary nodule    Noncalcified 1 cm RLL nodules,  Presumed to have been present on 2007 study per radiologist .. One YEAR FOLLOW up imaging has been advised.       B12 deficiency    She has received 3 weekly injections and will continue monthly injections .  His grandduaghter is a CMA and may decide to continue giving her the medication instread  of coming here.        Other Visit Diagnoses    Vitamin B 12 deficiency        Relevant Medications    cyanocobalamin ((VITAMIN B-12)) injection 1,000 mcg (Completed)     A total of 25 minutes of face to face time was spent with patient more than half of which was spent in counselling about the above mentioned conditions  and coordination of care   I am having Ms. Norbeck maintain her calcium-vitamin D, LORazepam, buPROPion, diltiazem, losartan, and Tdap. We administered cyanocobalamin.  Meds ordered this encounter  Medications  . cyanocobalamin ((VITAMIN B-12)) injection 1,000 mcg    Sig:     There are no discontinued medications.  Follow-up: Return in about 1 month (around 06/01/2015), or b12 injection   6 mlths with me .   [name], MD

## 2015-05-01 NOTE — Patient Instructions (Signed)
You have decided  NOT to start any medication to treat your osteoporosis.  Please try to get daily calcium intake of  1800 mg daily through diet and supplements ,  And continue walking every day and using low weight dumbbells to strengthen your arms   We will repeat your CT scan in Feb 2018 to folllow up on the pulmonary nodule  You are due for monthly B12 shots starting around April 20th.  If your granddaughter can give these to you ,  We can arrange to send the medication to your pharmacy  Otherwise, continue coming here for 6 months, then we will switch you to an orally dissolved supplement

## 2015-05-01 NOTE — Progress Notes (Signed)
Pre-visit discussion using our clinic review tool. No additional management support is needed unless otherwise documented below in the visit note.  

## 2015-05-03 MED ORDER — CYANOCOBALAMIN 1000 MCG/ML IJ SOLN
1000.0000 ug | Freq: Once | INTRAMUSCULAR | Status: AC
  Administered 2015-05-01: 1000 ug via INTRAMUSCULAR

## 2015-05-04 DIAGNOSIS — E538 Deficiency of other specified B group vitamins: Secondary | ICD-10-CM | POA: Insufficient documentation

## 2015-05-04 DIAGNOSIS — R911 Solitary pulmonary nodule: Secondary | ICD-10-CM | POA: Insufficient documentation

## 2015-05-04 NOTE — Assessment & Plan Note (Signed)
Noncalcified 1 cm RLL nodules,  Presumed to have been present on 2007 study per radiologist .. One YEAR FOLLOW up imaging has been advised.

## 2015-05-04 NOTE — Assessment & Plan Note (Signed)
She continues to defer treatment  Due to concern of side effects of new medications and  History of gastritis with alendronate. .  Calcium, exercise and vitamin d requirements reviewed. No history of fractures.

## 2015-05-04 NOTE — Assessment & Plan Note (Signed)
Her prlonged cough has finally resolved.

## 2015-05-04 NOTE — Assessment & Plan Note (Signed)
She has received 3 weekly injections and will continue monthly injections .  His grandduaghter is a CMA and may decide to continue giving her the medication instread of coming here.

## 2015-05-27 ENCOUNTER — Other Ambulatory Visit (HOSPITAL_COMMUNITY): Payer: Self-pay | Admitting: Psychiatry

## 2015-06-04 ENCOUNTER — Ambulatory Visit (INDEPENDENT_AMBULATORY_CARE_PROVIDER_SITE_OTHER): Payer: Medicare Other

## 2015-06-04 DIAGNOSIS — E538 Deficiency of other specified B group vitamins: Secondary | ICD-10-CM

## 2015-06-04 MED ORDER — CYANOCOBALAMIN 1000 MCG/ML IJ SOLN
1000.0000 ug | Freq: Once | INTRAMUSCULAR | Status: AC
  Administered 2015-06-04: 1000 ug via INTRAMUSCULAR

## 2015-06-04 NOTE — Progress Notes (Signed)
Patient in today for B 12 injection. Injection given in Right Deltoid. Patient tolerated well.

## 2015-07-09 ENCOUNTER — Ambulatory Visit (INDEPENDENT_AMBULATORY_CARE_PROVIDER_SITE_OTHER): Payer: Medicare Other | Admitting: *Deleted

## 2015-07-09 DIAGNOSIS — E538 Deficiency of other specified B group vitamins: Secondary | ICD-10-CM | POA: Diagnosis not present

## 2015-07-09 MED ORDER — CYANOCOBALAMIN 1000 MCG/ML IJ SOLN
1000.0000 ug | Freq: Once | INTRAMUSCULAR | Status: AC
  Administered 2015-07-09: 1000 ug via INTRAMUSCULAR

## 2015-08-14 ENCOUNTER — Ambulatory Visit (INDEPENDENT_AMBULATORY_CARE_PROVIDER_SITE_OTHER): Payer: Medicare Other

## 2015-08-14 DIAGNOSIS — E538 Deficiency of other specified B group vitamins: Secondary | ICD-10-CM

## 2015-08-14 MED ORDER — CYANOCOBALAMIN 1000 MCG/ML IJ SOLN
1000.0000 ug | Freq: Once | INTRAMUSCULAR | Status: AC
  Administered 2015-08-14: 1000 ug via INTRAMUSCULAR

## 2015-08-14 NOTE — Progress Notes (Signed)
Patient came in for b12 injection.  Received in Right deltoid.  Patient tolerated well.  

## 2015-08-23 ENCOUNTER — Ambulatory Visit: Payer: Medicare Other | Admitting: Cardiovascular Disease

## 2015-09-02 ENCOUNTER — Ambulatory Visit (INDEPENDENT_AMBULATORY_CARE_PROVIDER_SITE_OTHER): Payer: Medicare Other | Admitting: Cardiovascular Disease

## 2015-09-02 ENCOUNTER — Encounter: Payer: Self-pay | Admitting: Cardiovascular Disease

## 2015-09-02 VITALS — BP 148/80 | HR 60 | Ht 64.0 in | Wt 117.0 lb

## 2015-09-02 DIAGNOSIS — R011 Cardiac murmur, unspecified: Secondary | ICD-10-CM

## 2015-09-02 DIAGNOSIS — F419 Anxiety disorder, unspecified: Secondary | ICD-10-CM

## 2015-09-02 DIAGNOSIS — F418 Other specified anxiety disorders: Secondary | ICD-10-CM | POA: Diagnosis not present

## 2015-09-02 DIAGNOSIS — I10 Essential (primary) hypertension: Secondary | ICD-10-CM | POA: Diagnosis not present

## 2015-09-02 DIAGNOSIS — I6529 Occlusion and stenosis of unspecified carotid artery: Secondary | ICD-10-CM

## 2015-09-02 DIAGNOSIS — I341 Nonrheumatic mitral (valve) prolapse: Secondary | ICD-10-CM | POA: Diagnosis not present

## 2015-09-02 DIAGNOSIS — R002 Palpitations: Secondary | ICD-10-CM

## 2015-09-02 DIAGNOSIS — F329 Major depressive disorder, single episode, unspecified: Secondary | ICD-10-CM

## 2015-09-02 DIAGNOSIS — F32A Depression, unspecified: Secondary | ICD-10-CM

## 2015-09-02 MED ORDER — LOSARTAN POTASSIUM 50 MG PO TABS: 75.0000 mg | ORAL_TABLET | Freq: Every day | ORAL | 3 refills | Status: DC

## 2015-09-02 MED ORDER — DILTIAZEM HCL ER COATED BEADS 180 MG PO CP24: 180.0000 mg | ORAL_CAPSULE | Freq: Every day | ORAL | 3 refills | Status: DC

## 2015-09-02 NOTE — Patient Instructions (Signed)
Your physician has requested that you have a carotid duplex. This test is an ultrasound of the carotid arteries in your neck. It looks at blood flow through these arteries that supply the brain with blood. Allow one hour for this exam. There are no restrictions or special instructions.  Your physician has recommended you make the following change in your medication: the diltiazem has been increased to 180 mg daily. A new prescription has been sent to your pharmacy.  Your physician wants you to follow-up in: 1 year or sooner if needed. You will receive a reminder letter in the mail two months in advance. If you don't receive a letter, please call our office to schedule the follow-up appointment.

## 2015-09-03 ENCOUNTER — Encounter: Payer: Self-pay | Admitting: Cardiovascular Disease

## 2015-09-03 NOTE — Progress Notes (Signed)
Patient ID: Chloe Perkins, female   DOB: 1945/08/19, 70 y.o.   MRN: [id number]    Primary M.D.: Dr. Deborra Medina  HPI: Chloe Perkins is a 70 y.o. female who presents to the office for a 9 month cardiology follow-up evaluation.  [name] has a history of SVT and hypertension, for which she has been on diltiazem at 120 mg  and losartan 50 mg daily.  She has  also remote history of thyroid abnormality and in the past  had taken levothyroxine.  She has mild renal insufficiency with stage III renal disease.  Has a history of anxiety/depression for which she takes Wellbutrin XL 300 mg daily.  She also is on calcium with vitamin D in light of osteoporosis.  Recently, she believes that she has been experiencing more episodes of palpitations which are short-lived and at times occur daily.  She denies associated presyncope or syncope.  She denies chest pain.  She recently underwent a panoramix dental x-ray and was told by her dentist that she may have calcification of her carotids.   She has a history of osteoporosis, as well as vitamin B12 deficiency and has previously been found to have stable solitary pulmonary nodule.  Since I last saw her, she had a follow-up echo Doppler study in November 2016.  This showed an ejection fraction at 60-65%.  She had normal diastolic parameters.  There was turbulent flow at the base of her aortic valve in the region of the right coronary cusp which was most likely the cause of her systolic murmur.  She presents for reevaluation  Past Medical History:  Diagnosis Date  . Anxiety and depression   . Asthmatic bronchitis   . Cancer ([place])    MELANOMA  . Hemorrhoids   . History of melanoma   . HLD (hyperlipidemia)   . Hypertension   . Mitral valve prolapse   . Osteoporosis   . Personal history of malignant melanoma of skin   . SVT (supraventricular tachycardia) ([place])   . Viral gastroenteritis     Past Surgical History:  Procedure Laterality Date  .  ABDOMINAL HYSTERECTOMY  1975  . COLONOSCOPY  04/15/2004   2006: Normal  . ENDOMETRIAL ABLATION  1976  . left thumb tendon repair  10/09  . MELANOMA EXCISION  1999   superficial spreading melanoma right post shoulder  . NM MYOCAR PERF WALL MOTION  12/28/2008   No ischemia  . SKIN CANCER EXCISION  03/02/2013   superficial basal cell cancer  . US ECHOCARDIOGRAPHY  10/16/2010   mitral valve leaflets midly thickened,trace MR.  . WRIST SURGERY Left 2011    Allergies  Allergen Reactions  . Alendronate Sodium     REACTION: pt states INTOL to Fosamax  . Verapamil     REACTION: Intol w/ bradycardia    Current Outpatient Prescriptions  Medication Sig Dispense Refill  . buPROPion (WELLBUTRIN XL) 300 MG 24 hr tablet Take 300 mg by mouth daily.    . calcium-vitamin D (OSCAL WITH D) 500-200 MG-UNIT per tablet Take 1 tablet by mouth 2 (two) times daily.      Marland Kitchen LORazepam (ATIVAN) 0.5 MG tablet Take 0.5 mg by mouth 2 (two) times daily.     Marland Kitchen losartan (COZAAR) 50 MG tablet Take 1.5 tablets (75 mg total) by mouth daily. 135 tablet 3  . Tdap (BOOSTRIX) 5-2.5-18.5 LF-MCG/0.5 injection Inject 0.5 mLs into the muscle once. 0.5 mL 0  . diltiazem (CARDIZEM CD) 180 MG 24 hr  capsule Take 1 capsule (180 mg total) by mouth daily. 90 capsule 3   No current facility-administered medications for this visit.     Social History   Social History  . Marital status: Married    Spouse name: sammy Freas  . Number of children: 1  . Years of education: N/A   Occupational History  . Retired    Social History Main Topics  . Smoking status: Former Smoker    Packs/day: 0.50    Years: 20.00    Types: Cigarettes    Quit date: 02/09/1990  . Smokeless tobacco: Never Used  . Alcohol use No  . Drug use: No  . Sexual activity: Not on file   Other Topics Concern  . Not on file   Social History Narrative   She lives her family, she has one child, retired, no smoke no drink    Family History  Problem Relation  Age of Onset  . Emphysema Father     PGF, brother, sister  . COPD Father     PGF, brother, sister  . Rheum arthritis Mother   . Stroke Mother   . Heart disease Mother     Father, brother, sister  . Lung cancer Paternal Grandfather   . Ovarian cancer Paternal Aunt   . Breast cancer Cousin   . Cancer Brother     ROS General: Negative; No fevers, chills, or night sweats;  HEENT: Negative; No changes in vision or hearing, sinus congestion, difficulty swallowing Pulmonary: Negative; No cough, wheezing, shortness of breath, hemoptysis Cardiovascular: Negative; No chest pain, presyncope, syncope, palpitations GI: Negative; No nausea, vomiting, diarrhea, or abdominal pain GU: Negative; No dysuria, hematuria, or difficulty voiding Musculoskeletal: she complains of chronic back pain.  Hematologic/Oncology: Negative; no easy bruising, bleeding Endocrine: Negative; no heat/cold intolerance; no diabetes Neuro: Negative; no changes in balance, headaches Skin: Negative; No rashes or skin lesions Psychiatric: Positive for anxiety/depression on Wellbutrin Sleep: Negative; No snoring, daytime sleepiness, hypersomnolence, bruxism, restless legs, hypnogognic hallucinations, no cataplexy Other comprehensive 14 point system review is negative.   PE BP (!) 148/80   Pulse 60   Ht _0  (1.626 m)   Wt 117 lb (53.1 kg)   BMI 20.08 kg/m   Repeat BP 142/78.  Wt Readings from Last 3 Encounters:  09/02/15 117 lb (53.1 kg)  05/01/15 118 lb 8 oz (53.8 kg)  03/18/15 119 lb 8 oz (54.2 kg)   General: Alert, oriented, no distress.  Skin: normal turgor, no rashes HEENT: Normocephalic, atraumatic. Pupils round and reactive; sclera anicteric;no lid lag. Extraocular muscles intact;; no xanthelasmas. Nose without nasal septal hypertrophy Mouth/Parynx benign; Mallinpatti scale 2 Neck: No JVD, without clearly discernible carotid bruits; normal carotid upstroke Lungs: clear to ausculatation and percussion;  no wheezing or rales Chest wall: no tenderness to palpitation; mild pectus excavatum Heart: RRR, s1 s2 normal; 4-1/3 systolic murmur in the aortic region; no diastolic murmur, rub thrills or heaves Abdomen: soft, nontender; no hepatosplenomehaly, BS+; abdominal aorta nontender and not dilated by palpation. Back: no CVA tenderness Pulses 2+ Extremities: no clubbing cyanosis or edema, Homan's sign negative  Neurologic: grossly nonfocal; cranial nerves grossly normal. Psychologic: normal affect and mood.  ECG (independently read by me): Sinus rhythm at 60 bpm with PAC.  Normal intervals.  November 2016 ECG (independently read by me): Normal sinus rhythm at 66 bpm.  Mild RV conduction delay.  Nondiagnostic small inferior Q waves.  ECG (independently read by me): sinus bradycardia 50 bpm.  Mild RV conduction delay  November 2015 ECG (independently read by me0;  Normal sinus rhythm at 77 bpm.  RV conduction delay.  Nondiagnostic small Q waves.  Nonspecific ST changes.  April 2015 ECG (independently read by me): Sinus bradycardia 54 beats per minute.  Mild RV conduction delay.  No ectopy.  LABS: BMP Latest Ref Rng & Units 03/18/2015 12/11/2014 11/28/2014  Glucose 70 - 99 mg/dL 98 90 -  BUN 6 - 23 mg/dL _0 Creatinine 0.40 - 1.20 mg/dL 1.02 1.03(H) 1.0  Sodium 135 - 145 mEq/L 139 139 140  Potassium 3.5 - 5.1 mEq/L 4.8 4.9 5.1  Chloride 96 - 112 mEq/L 103 105 -  CO2 19 - 32 mEq/L 29 27 -  Calcium 8.4 - 10.5 mg/dL 10.4 9.2 -   Hepatic Function Latest Ref Rng & Units 12/11/2014 11/28/2014 06/26/2013  Total Protein 6.1 - 8.1 g/dL 6.4 - 7.1  Albumin 3.6 - 5.1 g/dL 3.9 - 4.2  AST 10 - 35 U/L _1 ALT 6 - 29 U/L _2 Alk Phosphatase 33 - 130 U/L 80 91 69  Total Bilirubin 0.2 - 1.2 mg/dL 0.5 - 0.2  Bilirubin, Direct 0.0 - 0.3 mg/dL - - -   CBC Latest Ref Rng & Units 12/11/2014 11/28/2014 05/04/2013  WBC 4.0 - 10.5 K/uL 5.0 3.3 3.7(L)  Hemoglobin 12.0 - 15.0 g/dL 14.3 14.3 14.3    Hematocrit 36.0 - 46.0 % 42.5 41 42.4  Platelets 150 - 400 K/uL 229 202 201.0   Lab Results  Component Value Date   MCV 95.5 12/11/2014   MCV 94.7 05/04/2013   MCV 90.2 05/25/2012   Lab Results  Component Value Date   TSH 3.372 12/11/2014  No results found for: HGBA1C  Lipid Panel     Component Value Date/Time   CHOL 216 (H) 12/11/2014 0859   CHOL 203 (H) 05/22/2011 0759   TRIG 75 12/11/2014 0859   TRIG 65 05/22/2011 0759   HDL 85 12/11/2014 0859   HDL 88 (H) 05/22/2011 0759   CHOLHDL 2.5 12/11/2014 0859   VLDL 15 12/11/2014 0859   VLDL 13 05/22/2011 0759   LDLCALC 116 12/11/2014 0859   LDLCALC 102 (H) 05/22/2011 0759   LDLDIRECT 102.2 04/23/2006 1211     RADIOLOGY: No results found.    ASSESSMENT AND PLAN: Ms. Tressie Ragin is a 70 year old female who has a history of SVT as well as hypertension.  Most recently, she has been maintained on long-acting Cardizem 120 g daily in addition to losartan 75 mg daily.  She has noticed a sensation of more recent short-lived palpitations which at times may last for several minutes and have occurred at times daily.  She denies associated chest tightness.  She denies any recent increase caffeinated beverage.  I am recommending further titration of her diltiazem to 180 mg daily.  She was recently told that she has calcification of her carotids on a dental x-ray.  I will schedule her for carotid duplex imaging for further evaluation. The increase diltiazem dose will also improve her mild blood pressure elevation.  Present she will continue her current dose of losartan.  In the past she had very mild renal insufficiency.  Her last creatinine measured in February was improved at 1.02.  She sees Dr. Wendall Stade who will be checking follow-up laboratory.  I will contact her guarding the results of her carotid study.  I reviewed her echo Doppler study which was done  in November 2016.  She had normal systolic and diastolic function.  Turbulent flow is  noted at the base of the aortic valve in the region of the right carotid artery cusp, which most likely is the cause for her systolic aortic murmur.  She has a history of anxiety/depression , which seems to be seems to be controlled with her current dose of Wellbutrin XL 300 mg daily.  As long as she remains stable, I will see her in one year for cardiology reevaluation.  Time spent: 25 minutes  Troy Sine, MD, [facility]  09/03/2015 3:43 PM

## 2015-09-09 ENCOUNTER — Ambulatory Visit (HOSPITAL_COMMUNITY)
Admission: RE | Admit: 2015-09-09 | Discharge: 2015-09-09 | Disposition: A | Discharge status: Home / Self Care | Payer: Medicare Other | Source: Ambulatory Visit | Attending: Internal Medicine | Admitting: Internal Medicine

## 2015-09-09 DIAGNOSIS — F329 Major depressive disorder, single episode, unspecified: Secondary | ICD-10-CM | POA: Diagnosis not present

## 2015-09-09 DIAGNOSIS — I6529 Occlusion and stenosis of unspecified carotid artery: Secondary | ICD-10-CM | POA: Diagnosis present

## 2015-09-09 DIAGNOSIS — I10 Essential (primary) hypertension: Secondary | ICD-10-CM | POA: Diagnosis not present

## 2015-09-09 DIAGNOSIS — I6523 Occlusion and stenosis of bilateral carotid arteries: Secondary | ICD-10-CM | POA: Insufficient documentation

## 2015-09-09 DIAGNOSIS — F419 Anxiety disorder, unspecified: Secondary | ICD-10-CM | POA: Insufficient documentation

## 2015-09-09 DIAGNOSIS — E785 Hyperlipidemia, unspecified: Secondary | ICD-10-CM | POA: Diagnosis not present

## 2015-09-13 ENCOUNTER — Telehealth: Payer: Self-pay | Admitting: Cardiovascular Disease

## 2015-09-13 NOTE — Telephone Encounter (Signed)
Returned call to patient-made aware that carotid US results have not been read at this time by MD.  Once read by MD, primary nurse will call with results.  Pt verbalized understanding.

## 2015-09-13 NOTE — Telephone Encounter (Signed)
New message   Pt is calling for rn to report her results for her   09/09/2015     Time: 10:00 AM    Visit Type: CAROTID [id number]

## 2015-09-17 ENCOUNTER — Ambulatory Visit: Payer: Medicare Other

## 2015-09-19 ENCOUNTER — Telehealth: Payer: Self-pay | Admitting: Cardiovascular Disease

## 2015-09-19 NOTE — Telephone Encounter (Signed)
Want the results of the doppler test done 2 weeks ago.

## 2015-09-23 NOTE — Telephone Encounter (Signed)
Pt calling again,she called last week. She still waiting to get her doppler,please let her know something today. If you dont have them please let her know that.

## 2015-09-23 NOTE — Telephone Encounter (Signed)
Returned call to patient. She states she had her carotid doppler done on 7/31 and was told she would have the results back in 3 days. There is no result note available on this study. She called in about this on 8/4 as well. Advised patient that I will have to defer to the MD to provide the result note and that she will be contacted when we have the information to communicate to her. Apologized for the inconvenience.   Message routed to Dr. Claiborne Billings

## 2015-09-24 ENCOUNTER — Ambulatory Visit (INDEPENDENT_AMBULATORY_CARE_PROVIDER_SITE_OTHER): Payer: Medicare Other

## 2015-09-24 DIAGNOSIS — E538 Deficiency of other specified B group vitamins: Secondary | ICD-10-CM

## 2015-09-24 MED ORDER — CYANOCOBALAMIN 1000 MCG/ML IJ SOLN
1000.0000 ug | Freq: Once | INTRAMUSCULAR | Status: AC
  Administered 2015-09-24: 1000 ug via INTRAMUSCULAR

## 2015-09-24 NOTE — Progress Notes (Signed)
Patient came in for b12 injection, received in Left deltoid.  Patient tolerated well.

## 2015-10-29 ENCOUNTER — Ambulatory Visit: Payer: Medicare Other

## 2015-10-31 ENCOUNTER — Ambulatory Visit (INDEPENDENT_AMBULATORY_CARE_PROVIDER_SITE_OTHER): Payer: Medicare Other | Admitting: *Deleted

## 2015-10-31 DIAGNOSIS — E538 Deficiency of other specified B group vitamins: Secondary | ICD-10-CM

## 2015-10-31 MED ORDER — CYANOCOBALAMIN 1000 MCG/ML IJ SOLN
1000.0000 ug | Freq: Once | INTRAMUSCULAR | Status: AC
  Administered 2015-10-31: 1000 ug via INTRAMUSCULAR

## 2015-10-31 NOTE — Progress Notes (Addendum)
Patient presents today for B12 injection.  Injection given in right deltoid.   Reviewed.  Dr Nicki Reaper

## 2015-12-11 ENCOUNTER — Encounter (INDEPENDENT_AMBULATORY_CARE_PROVIDER_SITE_OTHER): Payer: Self-pay

## 2015-12-11 ENCOUNTER — Ambulatory Visit (INDEPENDENT_AMBULATORY_CARE_PROVIDER_SITE_OTHER): Payer: Medicare Other

## 2015-12-11 DIAGNOSIS — E538 Deficiency of other specified B group vitamins: Secondary | ICD-10-CM

## 2015-12-11 MED ORDER — CYANOCOBALAMIN 1000 MCG/ML IJ SOLN
1000.0000 ug | Freq: Once | INTRAMUSCULAR | Status: AC
  Administered 2015-12-11: 1000 ug via INTRAMUSCULAR

## 2015-12-11 NOTE — Progress Notes (Addendum)
Patient presents for B12 injection.  Injected left deltoid patient tolerated well.    Reviewed.  Dr Nicki Reaper

## 2015-12-12 ENCOUNTER — Telehealth: Payer: Self-pay | Admitting: Internal Medicine

## 2015-12-12 NOTE — Progress Notes (Signed)
  I have reviewed the above information and agree with above.   Teresa Tullo, MD 

## 2015-12-12 NOTE — Telephone Encounter (Signed)
I called pt and left a vm to call office to sch AWV. Thank you! °

## 2015-12-31 ENCOUNTER — Encounter: Payer: Self-pay | Admitting: Cardiovascular Disease

## 2015-12-31 ENCOUNTER — Ambulatory Visit (INDEPENDENT_AMBULATORY_CARE_PROVIDER_SITE_OTHER): Payer: Medicare Other | Admitting: Cardiovascular Disease

## 2015-12-31 VITALS — BP 128/58 | HR 61 | Ht 64.0 in | Wt 119.0 lb

## 2015-12-31 DIAGNOSIS — I6529 Occlusion and stenosis of unspecified carotid artery: Secondary | ICD-10-CM | POA: Diagnosis not present

## 2015-12-31 DIAGNOSIS — R002 Palpitations: Secondary | ICD-10-CM

## 2015-12-31 DIAGNOSIS — I10 Essential (primary) hypertension: Secondary | ICD-10-CM

## 2015-12-31 DIAGNOSIS — I471 Supraventricular tachycardia: Secondary | ICD-10-CM

## 2015-12-31 DIAGNOSIS — E7800 Pure hypercholesterolemia, unspecified: Secondary | ICD-10-CM

## 2015-12-31 DIAGNOSIS — E039 Hypothyroidism, unspecified: Secondary | ICD-10-CM | POA: Diagnosis not present

## 2015-12-31 DIAGNOSIS — I341 Nonrheumatic mitral (valve) prolapse: Secondary | ICD-10-CM | POA: Diagnosis not present

## 2015-12-31 MED ORDER — DILTIAZEM HCL ER COATED BEADS 240 MG PO CP24: 240.0000 mg | ORAL_CAPSULE | Freq: Every day | ORAL | 3 refills | Status: DC

## 2015-12-31 NOTE — Patient Instructions (Addendum)
Increase Cardizem to 240 mg every night  Fasting Lab  ( cmet,lipid,tsh,cbc )   Your physician recommends that you schedule a follow-up appointment in: 3 months  Wed 04/01/16 at 11:15 am

## 2016-01-05 NOTE — Progress Notes (Signed)
Patient ID: Chloe Perkins, female   DOB: 12/07/1945, 70 y.o.   MRN: [id number]    Primary M.D.: Dr. Deborra Medina  HPI: Chloe Perkins is a 70 y.o. female who presents to the office for a 5 month cardiology follow-up evaluation.  Chloe Perkins has a history of SVT and hypertension, for which she has been on diltiazem at 120 mg  and losartan 50 mg daily.  She has  also remote history of thyroid abnormality and in the past  had taken levothyroxine.  She has mild renal insufficiency with stage III renal disease.  Has a history of anxiety/depression for which she takes Wellbutrin XL 300 mg daily.  She also is on calcium with vitamin D in light of osteoporosis.  Recently, she believes that she has been experiencing more episodes of palpitations which are short-lived and at times occur daily.  She denies associated presyncope or syncope.  She denies chest pain.  She recently underwent a panoramix dental x-ray and was told by her dentist that she may have calcification of her carotids.   She has a history of osteoporosis, as well as vitamin B12 deficiency and has previously been found to have stable solitary pulmonary nodule.  She had a follow-up echo Doppler study in November 2016 which showed an ejection fraction at 60-65%.  She had normal diastolic parameters.  There was turbulent flow at the base of her aortic valve in the region of the right coronary cusp which was most likely the cause of her systolic murmur.    Since I last saw her, he has felt well.  However, she continues to experience transient Pap palpitations which lasts less than 5 seconds, almost on a daily basis.  She has been using caffeine.  She underwent a carotid duplex study in July 2017 which showed heterogeneous plaque bilaterally and 1-39% bilateral ICA stenoses with normal subclavian arteries, and patent vertebral arteries with antegrade flow.  She presents for reevaluation  Past Medical History:  Diagnosis Date  . Anxiety and  depression   . Asthmatic bronchitis   . Cancer ([place])    MELANOMA  . Hemorrhoids   . History of melanoma   . HLD (hyperlipidemia)   . Hypertension   . Mitral valve prolapse   . Osteoporosis   . Personal history of malignant melanoma of skin   . SVT (supraventricular tachycardia) ([place])   . Viral gastroenteritis     Past Surgical History:  Procedure Laterality Date  . ABDOMINAL HYSTERECTOMY  1975  . COLONOSCOPY  04/15/2004   2006: Normal  . ENDOMETRIAL ABLATION  1976  . left thumb tendon repair  10/09  . MELANOMA EXCISION  1999   superficial spreading melanoma right post shoulder  . NM MYOCAR PERF WALL MOTION  12/28/2008   No ischemia  . SKIN CANCER EXCISION  03/02/2013   superficial basal cell cancer  . US ECHOCARDIOGRAPHY  10/16/2010   mitral valve leaflets midly thickened,trace MR.  . WRIST SURGERY Left 2011    Allergies  Allergen Reactions  . Alendronate Sodium     REACTION: pt states INTOL to Fosamax  . Verapamil     REACTION: Intol w/ bradycardia    Current Outpatient Prescriptions  Medication Sig Dispense Refill  . buPROPion (WELLBUTRIN XL) 300 MG 24 hr tablet Take 300 mg by mouth daily.    . calcium-vitamin D (OSCAL WITH D) 500-200 MG-UNIT per tablet Take 1 tablet by mouth 2 (two) times daily.      Marland Kitchen  LORazepam (ATIVAN) 0.5 MG tablet Take 0.5 mg by mouth 2 (two) times daily.     Marland Kitchen losartan (COZAAR) 50 MG tablet Take 1.5 tablets (75 mg total) by mouth daily. 135 tablet 3  . Tdap (BOOSTRIX) 5-2.5-18.5 LF-MCG/0.5 injection Inject 0.5 mLs into the muscle once. 0.5 mL 0  . diltiazem (CARDIZEM CD) 240 MG 24 hr capsule Take 1 capsule (240 mg total) by mouth daily. 90 capsule 3   No current facility-administered medications for this visit.     Social History   Social History  . Marital status: Married    Spouse name: Chloe Perkins  . Number of children: 1  . Years of education: N/A   Occupational History  . Retired    Social History Main Topics  . Smoking  status: Former Smoker    Packs/day: 0.50    Years: 20.00    Types: Cigarettes    Quit date: 02/09/1990  . Smokeless tobacco: Never Used  . Alcohol use No  . Drug use: No  . Sexual activity: Not on file   Other Topics Concern  . Not on file   Social History Narrative   She lives her family, she has one child, retired, no smoke no drink    Family History  Problem Relation Age of Onset  . Emphysema Father     PGF, brother, sister  . COPD Father     PGF, brother, sister  . Rheum arthritis Mother   . Stroke Mother   . Heart disease Mother     Father, brother, sister  . Lung cancer Paternal Grandfather   . Ovarian cancer Paternal Aunt   . Breast cancer Cousin   . Cancer Brother     ROS General: Negative; No fevers, chills, or night sweats;  HEENT: Negative; No changes in vision or hearing, sinus congestion, difficulty swallowing Pulmonary: Negative; No cough, wheezing, shortness of breath, hemoptysis Cardiovascular: Negative; No chest pain, presyncope, syncope, palpitations GI: Negative; No nausea, vomiting, diarrhea, or abdominal pain GU: Negative; No dysuria, hematuria, or difficulty voiding Musculoskeletal: she complains of chronic back pain.  Hematologic/Oncology: Negative; no easy bruising, bleeding Endocrine: Negative; no heat/cold intolerance; no diabetes Neuro: Negative; no changes in balance, headaches Skin: Negative; No rashes or skin lesions Psychiatric: Positive for anxiety/depression on Wellbutrin Sleep: Negative; No snoring, daytime sleepiness, hypersomnolence, bruxism, restless legs, hypnogognic hallucinations, no cataplexy Other comprehensive 14 point system review is negative.   PE BP (!) 128/58   Pulse 61   Ht 5' 4"  (1.626 m)   Wt 119 lb (54 kg)   BMI 20.43 kg/m   Repeat BP 140/70.  Wt Readings from Last 3 Encounters:  12/31/15 119 lb (54 kg)  09/02/15 117 lb (53.1 kg)  05/01/15 118 lb 8 oz (53.8 kg)   General: Alert, oriented, no distress.    Skin: normal turgor, no rashes HEENT: Normocephalic, atraumatic. Pupils round and reactive; sclera anicteric;no lid lag. Extraocular muscles intact;; no xanthelasmas. Nose without nasal septal hypertrophy Mouth/Parynx benign; Mallinpatti scale 2 Neck: No JVD, without clearly discernible carotid bruits; normal carotid upstroke Lungs: clear to ausculatation and percussion; no wheezing or rales Chest wall: no tenderness to palpitation; mild pectus excavatum Heart: RRR, s1 s2 normal; 8-6/5 systolic murmur in the aortic region; no diastolic murmur, rub thrills or heaves Abdomen: soft, nontender; no hepatosplenomehaly, BS+; abdominal aorta nontender and not dilated by palpation. Back: no CVA tenderness Pulses 2+ Extremities: no clubbing cyanosis or edema, Homan's sign negative  Neurologic: grossly nonfocal;  cranial nerves grossly normal. Psychologic: normal affect and mood.  ECG (independently read by me): Normal sinus rhythm at 61 bpm.  No ectopy.  Normal intervals.  July 2017 ECG (independently read by me): Sinus rhythm at 60 bpm with PAC.  Normal intervals.  November 2016 ECG (independently read by me): Normal sinus rhythm at 66 bpm.  Mild RV conduction delay.  Nondiagnostic small inferior Q waves.  ECG (independently read by me): sinus bradycardia 50 bpm.  Mild RV conduction delay  November 2015 ECG (independently read by me0;  Normal sinus rhythm at 77 bpm.  RV conduction delay.  Nondiagnostic small Q waves.  Nonspecific ST changes.  April 2015 ECG (independently read by me): Sinus bradycardia 54 beats per minute.  Mild RV conduction delay.  No ectopy.  LABS: BMP Latest Ref Rng & Units 03/18/2015 12/11/2014 11/28/2014  Glucose 70 - 99 mg/dL 98 90 -  BUN 6 - 23 mg/dL 16 15 16   Creatinine 0.40 - 1.20 mg/dL 1.02 1.03(H) 1.0  Sodium 135 - 145 mEq/L 139 139 140  Potassium 3.5 - 5.1 mEq/L 4.8 4.9 5.1  Chloride 96 - 112 mEq/L 103 105 -  CO2 19 - 32 mEq/L 29 27 -  Calcium 8.4 - 10.5 mg/dL  10.4 9.2 -   Hepatic Function Latest Ref Rng & Units 12/11/2014 11/28/2014 06/26/2013  Total Protein 6.1 - 8.1 g/dL 6.4 - 7.1  Albumin 3.6 - 5.1 g/dL 3.9 - 4.2  AST 10 - 35 U/L 19 20 25   ALT 6 - 29 U/L 15 12 13   Alk Phosphatase 33 - 130 U/L 80 91 69  Total Bilirubin 0.2 - 1.2 mg/dL 0.5 - 0.2  Bilirubin, Direct 0.0 - 0.3 mg/dL - - -   CBC Latest Ref Rng & Units 12/11/2014 11/28/2014 05/04/2013  WBC 4.0 - 10.5 K/uL 5.0 3.3 3.7(L)  Hemoglobin 12.0 - 15.0 g/dL 14.3 14.3 14.3  Hematocrit 36.0 - 46.0 % 42.5 41 42.4  Platelets 150 - 400 K/uL 229 202 201.0   Lab Results  Component Value Date   MCV 95.5 12/11/2014   MCV 94.7 05/04/2013   MCV 90.2 05/25/2012   Lab Results  Component Value Date   TSH 3.372 12/11/2014  No results found for: HGBA1C  Lipid Panel     Component Value Date/Time   CHOL 216 (H) 12/11/2014 0859   CHOL 203 (H) 05/22/2011 0759   TRIG 75 12/11/2014 0859   TRIG 65 05/22/2011 0759   HDL 85 12/11/2014 0859   HDL 88 (H) 05/22/2011 0759   CHOLHDL 2.5 12/11/2014 0859   VLDL 15 12/11/2014 0859   VLDL 13 05/22/2011 0759   LDLCALC 116 12/11/2014 0859   LDLCALC 102 (H) 05/22/2011 0759   LDLDIRECT 102.2 04/23/2006 1211     RADIOLOGY: No results found.    ASSESSMENT AND PLAN: Ms. Camdyn Laden is a 71 year old female who has a history of SVT as well as hypertension.  She had been maintained on long-acting Cardizem 120 g daily in addition to losartan 75 mg daily.  I last saw her she was having short-lived episodes of palpitations and I further titrated her diltiazem to 180 mg daily.  She has continued to experience short-lived palpitations almost on a daily basis.  Her blood pressure today is controlled, but I will further titrate Cardizem to 240 mg to take at bedtime.  She was found to have  calcification of her carotids on a dental x-ray.  She underwent carotid duplex imaging which showed  mild heterogeneous plaque with narrowing in the 1-39% range bilaterally, not  felt to be significant.  With her mild plaque.  I have recommended the addition of statin therapy with her LDL of 116.  She will undergo repeat lab work and if her lipid studies remain at that level.  I will start her on atorvastatin or rosuvastatin.  I will see her in 3 months for reevaluation.   Time spent: 25 minutes  Troy Sine, MD, [facility]  01/05/2016 8:58 PM

## 2016-01-14 ENCOUNTER — Ambulatory Visit: Payer: Medicare Other

## 2016-01-15 ENCOUNTER — Ambulatory Visit (INDEPENDENT_AMBULATORY_CARE_PROVIDER_SITE_OTHER): Payer: Medicare Other | Admitting: Orthopedic Surgery

## 2016-01-21 ENCOUNTER — Ambulatory Visit: Payer: Medicare Other

## 2016-01-22 ENCOUNTER — Ambulatory Visit: Payer: Medicare Other

## 2016-01-24 ENCOUNTER — Ambulatory Visit (INDEPENDENT_AMBULATORY_CARE_PROVIDER_SITE_OTHER): Payer: Medicare Other

## 2016-01-24 DIAGNOSIS — E538 Deficiency of other specified B group vitamins: Secondary | ICD-10-CM | POA: Diagnosis not present

## 2016-01-24 MED ORDER — CYANOCOBALAMIN 1000 MCG/ML IJ SOLN
1000.0000 ug | Freq: Once | INTRAMUSCULAR | Status: AC
  Administered 2016-01-24: 1000 ug via INTRAMUSCULAR

## 2016-01-24 NOTE — Progress Notes (Signed)
Patient comes in for B 12 injection.  Injected right deltoid.  Patient tolerated injection well.   

## 2016-01-26 NOTE — Progress Notes (Signed)
  I have reviewed the above information and agree with above.   Cassie Henkels, MD 

## 2016-02-21 ENCOUNTER — Other Ambulatory Visit: Payer: Self-pay | Admitting: Internal Medicine

## 2016-02-21 DIAGNOSIS — Z1231 Encounter for screening mammogram for malignant neoplasm of breast: Secondary | ICD-10-CM

## 2016-02-24 ENCOUNTER — Ambulatory Visit: Payer: Medicare Other

## 2016-02-25 ENCOUNTER — Ambulatory Visit (INDEPENDENT_AMBULATORY_CARE_PROVIDER_SITE_OTHER): Payer: Medicare Other

## 2016-02-25 ENCOUNTER — Ambulatory Visit: Payer: Medicare Other

## 2016-02-25 DIAGNOSIS — E538 Deficiency of other specified B group vitamins: Secondary | ICD-10-CM

## 2016-02-25 MED ORDER — CYANOCOBALAMIN 1000 MCG/ML IJ SOLN
1000.0000 ug | Freq: Once | INTRAMUSCULAR | Status: AC
  Administered 2016-02-25: 1000 ug via INTRAMUSCULAR

## 2016-02-25 NOTE — Progress Notes (Addendum)
Patient comes in B 12 injection.  Injected left deltoid.  Patient tolerated injection well.    Reviewed.  Dr Nicki Reaper

## 2016-03-12 ENCOUNTER — Ambulatory Visit (INDEPENDENT_AMBULATORY_CARE_PROVIDER_SITE_OTHER): Payer: No Typology Code available for payment source | Admitting: Orthopedic Surgery

## 2016-03-17 ENCOUNTER — Ambulatory Visit: Payer: Medicare Other

## 2016-03-23 ENCOUNTER — Ambulatory Visit (INDEPENDENT_AMBULATORY_CARE_PROVIDER_SITE_OTHER): Payer: Medicare Other

## 2016-03-23 VITALS — BP 122/64 | HR 66 | Temp 98.3°F | Resp 14 | Ht 64.0 in | Wt 118.4 lb

## 2016-03-23 DIAGNOSIS — Z0000 Encounter for general adult medical examination without abnormal findings: Secondary | ICD-10-CM | POA: Diagnosis not present

## 2016-03-23 NOTE — Progress Notes (Signed)
Subjective:   Chloe Perkins is a 71 y.o. female who presents for Medicare Annual (Subsequent) preventive examination.  Review of Systems:  No ROS.  Medicare Wellness Visit. Cardiac Risk Factors include: advanced age (>72men, >9 women);hypertension     Objective:     Vitals: BP 122/64 (BP Location: Right Arm, Patient Position: Sitting, Cuff Size: Normal)   Pulse 66   Temp 98.3 F (36.8 C) (Oral)   Resp 14   Ht 5\' 4"  (1.626 m)   Wt 118 lb 6.4 oz (53.7 kg)   SpO2 97%   BMI 20.32 kg/m   Body mass index is 20.32 kg/m.   Tobacco History  Smoking Status  . Former Smoker  . Packs/day: 0.50  . Years: 20.00  . Types: Cigarettes  . Quit date: 02/09/1990  Smokeless Tobacco  . Never Used     Counseling given: Not Answered   Past Medical History:  Diagnosis Date  . Anxiety and depression   . Asthmatic bronchitis   . Cancer ([place])    MELANOMA  . Hemorrhoids   . History of melanoma   . HLD (hyperlipidemia)   . Hypertension   . Mitral valve prolapse   . Osteoporosis   . Personal history of malignant melanoma of skin   . SVT (supraventricular tachycardia) ([place])   . Viral gastroenteritis    Past Surgical History:  Procedure Laterality Date  . ABDOMINAL HYSTERECTOMY  1975  . COLONOSCOPY  04/15/2004   2006: Normal  . ENDOMETRIAL ABLATION  1976  . left thumb tendon repair  10/09  . MELANOMA EXCISION  1999   superficial spreading melanoma right post shoulder  . NM MYOCAR PERF WALL MOTION  12/28/2008   No ischemia  . SKIN CANCER EXCISION  03/02/2013   superficial basal cell cancer  . US ECHOCARDIOGRAPHY  10/16/2010   mitral valve leaflets midly thickened,trace MR.  . WRIST SURGERY Left 2011   Family History  Problem Relation Age of Onset  . Emphysema Father     PGF, brother, sister  . COPD Father     PGF, brother, sister  . Heart disease Father   . Rheum arthritis Mother   . Stroke Mother   . Heart disease Mother     Father, brother, sister  . Lung cancer  Paternal Grandfather   . Ovarian cancer Paternal Aunt   . Breast cancer Cousin   . Cancer Brother   . Heart disease Brother   . Heart disease Sister    History  Sexual Activity  . Sexual activity: Yes    Outpatient Encounter Prescriptions as of 03/23/2016  Medication Sig  . buPROPion (WELLBUTRIN XL) 300 MG 24 hr tablet Take 300 mg by mouth daily.  . calcium-vitamin D (OSCAL WITH D) 500-200 MG-UNIT per tablet Take 1 tablet by mouth 2 (two) times daily.    Marland Kitchen diltiazem (CARDIZEM CD) 240 MG 24 hr capsule Take 1 capsule (240 mg total) by mouth daily.  Marland Kitchen LORazepam (ATIVAN) 0.5 MG tablet Take 0.5 mg by mouth 2 (two) times daily.   Marland Kitchen losartan (COZAAR) 50 MG tablet Take 1.5 tablets (75 mg total) by mouth daily.  . [DISCONTINUED] Tdap (BOOSTRIX) 5-2.5-18.5 LF-MCG/0.5 injection Inject 0.5 mLs into the muscle once.   No facility-administered encounter medications on file as of 03/23/2016.     Activities of Daily Living In your present state of health, do you have any difficulty performing the following activities: 03/23/2016  Hearing? N  Vision? N  Difficulty  concentrating or making decisions? N  Walking or climbing stairs? N  Dressing or bathing? N  Doing errands, shopping? N  Preparing Food and eating ? N  Using the Toilet? N  In the past six months, have you accidently leaked urine? N  Do you have problems with loss of bowel control? N  Managing your Medications? N  Managing your Finances? N  Housekeeping or managing your Housekeeping? N  Some recent data might be hidden    Patient Care Team: [name], MD as PCP - General (Internal Medicine) Gatha Mayer, MD as Consulting Physician (Gastroenterology) Beverly Gust, MD (Unknown Physician Specialty)    Assessment:    This is a routine wellness examination for [place]. The goal of the wellness visit is to assist the patient how to close the gaps in care and create a preventative care plan for the patient.   Taking  calcium VIT D as appropriate/Osteoporosis reviewed.  Medications reviewed; taking without issues or barriers.  Safety issues reviewed; smoke detectors in the home. No firearms in the home. Wears seatbelts when driving or riding with others. No violence in the home.  No identified risk were noted; The patient was oriented x 3; appropriate in dress and manner and no objective failures at ADL's or IADL's.   BMI; discussed the importance of a healthy diet, water intake and exercise. Educational material provided.  HTN; followed by PCP.  Hepatitis C screening; discussed.  Educational material provided.  ZOSTAVAX vaccine deferred per patient request. Educational material provided.  Patient Concerns: None at this time. Follow up with PCP as needed.  Exercise Activities and Dietary recommendations Current Exercise Habits: Home exercise routine (Active around the home), Type of exercise: walking, Time (Minutes): 20, Frequency (Times/Week): 2, Weekly Exercise (Minutes/Week): 40, Intensity: Moderate  Goals    . Increase physical activity          Walk 3 days a week, 30 minutes, moderate pace      Fall Risk Fall Risk  03/23/2016 03/18/2015 02/07/2015 02/07/2015  Falls in the past year? No No No No   Depression Screen PHQ 2/9 Scores 03/23/2016 03/18/2015  PHQ - 2 Score 0 0     Cognitive Function MMSE - Mini Mental State Exam 03/23/2016  Orientation to time 5  Orientation to Place 5  Registration 3  Attention/ Calculation 5  Recall 3  Language- name 2 objects 2  Language- repeat 1  Language- follow 3 step command 3  Language- read & follow direction 1  Write a sentence 1  Copy design 1  Total score 30        Immunization History  Administered Date(s) Administered  . Influenza Split 11/27/2010, 11/05/2011, 12/09/2012, 11/09/2013  . Influenza Whole 02/09/2009  . Influenza, High Dose Seasonal PF 11/11/2015  . Influenza-Unspecified 12/08/2014  . Pneumococcal Conjugate-13  03/18/2015  . Pneumococcal Polysaccharide-23 12/10/2011  . Tdap 07/09/2015   Screening Tests Health Maintenance  Topic Date Due  . Hepatitis C Screening  [birth year]  . ZOSTAVAX  06/04/2004  . COLONOSCOPY  04/16/2014  . MAMMOGRAM  03/31/2017  . TETANUS/TDAP  07/08/2025  . INFLUENZA VACCINE  Completed  . DEXA SCAN  Completed  . PNA vac Low Risk Adult  Completed      Plan:   End of life planning; Advance aging; Advanced directives discussed. No HCPOA/Living Will.  Additional information provided to help her start the conversation with her family.  Copy of HCPOA/Living Will short forms requested upon completion.  Time spent on this topic is 17 minutes.  Medicare Attestation I have personally reviewed: The patient's medical and social history Their use of alcohol, tobacco or illicit drugs Their current medications and supplements The patient's functional ability including ADLs,fall risks, home safety risks, cognitive, and hearing and visual impairment Diet and physical activities Evidence for depression   The patient's weight, height, BMI, and visual acuity have been recorded in the chart.  I have made referrals and provided education to the patient based on review of the above and I have provided the patient with a written personalized care plan for preventive services.    During the course of the visit the patient was educated and counseled about the following appropriate screening and preventive services:   Vaccines to include Pneumoccal, Influenza, Hepatitis B, Td, Zostavax, HCV  Electrocardiogram  Cardiovascular Disease  Colorectal cancer screening  Bone density screening  Diabetes screening  Glaucoma screening  Mammography/PAP  Nutrition counseling   Patient Instructions (the written plan) was given to the patient.   Varney Biles, LPN  [phone]

## 2016-03-23 NOTE — Patient Instructions (Addendum)
  Chloe Perkins , Thank you for taking time to come for your Medicare Wellness Visit. I appreciate your ongoing commitment to your health goals. Please review the following plan we discussed and let me know if I can assist you in the future.   Follow up with Dr. Derrel Nip as needed.  These are the goals we discussed: Goals    . Increase physical activity          Walk 3 days a week, 30 minutes, moderate pace       This is a list of the screening recommended for you and due dates:  Health Maintenance  Topic Date Due  .  Hepatitis C: One time screening is recommended by Center for Disease Control  (CDC) for  adults born from [birth year] through 1966.   [birth year]  . Shingles Vaccine  06/04/2004  . Colon Cancer Screening  04/16/2014  . Mammogram  03/31/2017  . Tetanus Vaccine  07/08/2025  . Flu Shot  Completed  . DEXA scan (bone density measurement)  Completed  . Pneumonia vaccines  Completed

## 2016-03-24 NOTE — Progress Notes (Signed)
  I have reviewed the above information and agree with above.   Trixy Loyola, MD 

## 2016-03-25 ENCOUNTER — Other Ambulatory Visit
Admission: RE | Admit: 2016-03-25 | Discharge: 2016-03-25 | Disposition: A | Discharge status: Home / Self Care | Payer: Medicare Other | Source: Ambulatory Visit | Attending: Cardiovascular Disease | Admitting: Cardiovascular Disease

## 2016-03-25 DIAGNOSIS — I10 Essential (primary) hypertension: Secondary | ICD-10-CM | POA: Insufficient documentation

## 2016-03-25 DIAGNOSIS — I341 Nonrheumatic mitral (valve) prolapse: Secondary | ICD-10-CM | POA: Diagnosis not present

## 2016-03-25 DIAGNOSIS — E039 Hypothyroidism, unspecified: Secondary | ICD-10-CM | POA: Diagnosis not present

## 2016-03-25 LAB — LIPID PANEL
CHOL/HDL RATIO: 2.5 ratio
Cholesterol: 215 mg/dL — ABNORMAL HIGH (ref 0–200)
HDL: 87 mg/dL (ref >40)
LDL CALC: 114 mg/dL — AB (ref 0–99)
Triglycerides: 71 mg/dL (ref <150)
VLDL: 14 mg/dL (ref 0–40)

## 2016-03-25 LAB — COMPREHENSIVE METABOLIC PANEL
ALBUMIN: 4.4 g/dL (ref 3.5–5.0)
ALT: 14 U/L (ref 14–54)
AST: 23 U/L (ref 15–41)
Alkaline Phosphatase: 73 U/L (ref 38–126)
Anion gap: 6 (ref 5–15)
BUN: 13 mg/dL (ref 6–20)
CHLORIDE: 106 mmol/L (ref 101–111)
CO2: 27 mmol/L (ref 22–32)
CREATININE: 1.07 mg/dL — AB (ref 0.44–1.00)
Calcium: 9.5 mg/dL (ref 8.9–10.3)
GFR calc Af Amer: 59 mL/min — ABNORMAL LOW (ref >60)
GFR calc non Af Amer: 51 mL/min — ABNORMAL LOW (ref >60)
Glucose, Bld: 95 mg/dL (ref 65–99)
POTASSIUM: 4.2 mmol/L (ref 3.5–5.1)
SODIUM: 139 mmol/L (ref 135–145)
Total Bilirubin: 0.6 mg/dL (ref 0.3–1.2)
Total Protein: 7.2 g/dL (ref 6.5–8.1)

## 2016-03-25 LAB — CBC WITH DIFFERENTIAL/PLATELET
BASOS ABS: 0 10*3/uL (ref 0–0.1)
Basophils Relative: 1 %
EOS PCT: 1 %
Eosinophils Absolute: 0 10*3/uL (ref 0–0.7)
HCT: 40.5 % (ref 35.0–47.0)
Hemoglobin: 13.8 g/dL (ref 12.0–16.0)
LYMPHS PCT: 23 %
Lymphs Abs: 0.8 10*3/uL — ABNORMAL LOW (ref 1.0–3.6)
MCH: 32.4 pg (ref 26.0–34.0)
MCHC: 34.1 g/dL (ref 32.0–36.0)
MCV: 95.1 fL (ref 80.0–100.0)
MONO ABS: 0.3 10*3/uL (ref 0.2–0.9)
Monocytes Relative: 10 %
Neutro Abs: 2.2 10*3/uL (ref 1.4–6.5)
Neutrophils Relative %: 65 %
PLATELETS: 170 10*3/uL (ref 150–440)
RBC: 4.26 MIL/uL (ref 3.80–5.20)
RDW: 12.7 % (ref 11.5–14.5)
WBC: 3.4 10*3/uL — AB (ref 3.6–11.0)

## 2016-03-25 LAB — TSH: TSH: 5.358 u[IU]/mL — AB (ref 0.350–4.500)

## 2016-03-31 ENCOUNTER — Ambulatory Visit: Payer: Medicare Other

## 2016-04-01 ENCOUNTER — Encounter: Payer: Self-pay | Admitting: Cardiovascular Disease

## 2016-04-01 ENCOUNTER — Telehealth: Payer: Self-pay | Admitting: *Deleted

## 2016-04-01 ENCOUNTER — Ambulatory Visit (INDEPENDENT_AMBULATORY_CARE_PROVIDER_SITE_OTHER): Payer: Medicare Other | Admitting: Cardiovascular Disease

## 2016-04-01 VITALS — BP 100/80 | HR 53 | Ht 64.0 in | Wt 118.0 lb

## 2016-04-01 DIAGNOSIS — I6529 Occlusion and stenosis of unspecified carotid artery: Secondary | ICD-10-CM | POA: Diagnosis not present

## 2016-04-01 DIAGNOSIS — R002 Palpitations: Secondary | ICD-10-CM | POA: Diagnosis not present

## 2016-04-01 DIAGNOSIS — E7800 Pure hypercholesterolemia, unspecified: Secondary | ICD-10-CM | POA: Diagnosis not present

## 2016-04-01 DIAGNOSIS — I10 Essential (primary) hypertension: Secondary | ICD-10-CM | POA: Diagnosis not present

## 2016-04-01 DIAGNOSIS — I471 Supraventricular tachycardia: Secondary | ICD-10-CM | POA: Diagnosis not present

## 2016-04-01 NOTE — Patient Instructions (Signed)
Your physician wants you to follow-up in: 1 year or sooner if needed. You will receive a reminder letter in the mail two months in advance. If you don't receive a letter, please call our office to schedule the follow-up appointment.   If you need a refill on your cardiac medications before your next appointment, please call your pharmacy.   

## 2016-04-01 NOTE — Telephone Encounter (Signed)
Received patient as walk in requesting call regarding recent lab work.   Chart review-patient had OV this AM with Dr. Claiborne Billings at 11:15 (after walk in).    Attempt to call patient to answer questions or concerns (should have been reviewed at [facility]?).  No answer, continues to ring without going to VM.  Notified by front office-patient requested lab work to be mailed to her after OV with Dr. Claiborne Billings.  Lab results printed and mailed to patient.

## 2016-04-01 NOTE — Progress Notes (Signed)
Patient ID: Chloe Perkins, female   DOB: 01/22/1946, 71 y.o.   MRN: [id number]    Primary M.D.: Dr. Deborra Medina  HPI: Chloe Perkins is a 71 y.o. female who presents to the office for a 3 month cardiology follow-up evaluation.  Ms. Roderick has a history of SVT and hypertension, for which she has been on diltiazem at 120 mg  and losartan 50 mg daily.  She has  also remote history of thyroid abnormality and in the past  had taken levothyroxine.  She has mild renal insufficiency with stage III renal disease.  Has a history of anxiety/depression for which she takes Wellbutrin XL 300 mg daily.  She also is on calcium with vitamin D in light of osteoporosis.  Recently, she believes that she has been experiencing more episodes of palpitations which are short-lived and at times occur daily.  She denies associated presyncope or syncope.  She denies chest pain.  She recently underwent a panoramix dental x-ray and was told by her dentist that she may have calcification of her carotids.   She has a history of osteoporosis, as well as vitamin B12 deficiency and has previously been found to have stable solitary pulmonary nodule.  She had a follow-up echo Doppler study in November 2016 which showed an ejection fraction at 60-65%.  She had normal diastolic parameters.  There was turbulent flow at the base of her aortic valve in the region of the right coronary cusp which was most likely the cause of her systolic murmur.    I last saw her, she was experiencing  transient palpitations lasting less than 5 seconds, almost on a daily basis.  She has been using caffeine.  She underwent a carotid duplex study in July 2017 which showed heterogeneous plaque bilaterally and 1-39% bilateral ICA stenoses with normal subclavian arteries, and patent vertebral arteries with antegrade flow.  I saw her, I further titrated her Cardizem and suggested that she take 240 mg at bedtime.  She's continued to take losartan 75 mg in the  morning.  These palpitations have improved with the titration of Cardizem and reduction of caffeine and now occur infrequently.  She denies chest pain.  She denies significant shortness of breath.  Eyes any weight change of significance.  She had repeat blood work obtained one week ago.  Her lipid studies remained borderline elevated with a total cholesterol 2:15, and LDL of 114.  Triglycerides were excellent at 71 and HD at 87.  Past Medical History:  Diagnosis Date  . Anxiety and depression   . Asthmatic bronchitis   . Cancer ([place])    MELANOMA  . Hemorrhoids   . History of melanoma   . HLD (hyperlipidemia)   . Hypertension   . Mitral valve prolapse   . Osteoporosis   . Personal history of malignant melanoma of skin   . SVT (supraventricular tachycardia) ([place])   . Viral gastroenteritis     Past Surgical History:  Procedure Laterality Date  . ABDOMINAL HYSTERECTOMY  1975  . COLONOSCOPY  04/15/2004   2006: Normal  . ENDOMETRIAL ABLATION  1976  . left thumb tendon repair  10/09  . MELANOMA EXCISION  1999   superficial spreading melanoma right post shoulder  . NM MYOCAR PERF WALL MOTION  12/28/2008   No ischemia  . SKIN CANCER EXCISION  03/02/2013   superficial basal cell cancer  . US ECHOCARDIOGRAPHY  10/16/2010   mitral valve leaflets midly thickened,trace MR.  . WRIST SURGERY  Left 2011    Allergies  Allergen Reactions  . Alendronate Sodium Other (See Comments)    REACTION: pt states INTOL to Fosamax  . Verapamil Other (See Comments)    REACTION: Intol w/ bradycardia    Current Outpatient Prescriptions  Medication Sig Dispense Refill  . buPROPion (WELLBUTRIN XL) 300 MG 24 hr tablet Take 300 mg by mouth daily.    . calcium-vitamin D (OSCAL WITH D) 500-200 MG-UNIT per tablet Take 1 tablet by mouth 2 (two) times daily.      Marland Kitchen LORazepam (ATIVAN) 0.5 MG tablet Take 0.5 mg by mouth 2 (two) times daily.     Marland Kitchen losartan (COZAAR) 50 MG tablet Take 1.5 tablets (75 mg total) by  mouth daily. 135 tablet 3  . diltiazem (CARDIZEM CD) 240 MG 24 hr capsule Take 1 capsule (240 mg total) by mouth daily. 90 capsule 3   No current facility-administered medications for this visit.     Social History   Social History  . Marital status: Married    Spouse name: sammy Sopp  . Number of children: 1  . Years of education: N/A   Occupational History  . Retired    Social History Main Topics  . Smoking status: Former Smoker    Packs/day: 0.50    Years: 20.00    Types: Cigarettes    Quit date: 02/09/1990  . Smokeless tobacco: Never Used  . Alcohol use No  . Drug use: No  . Sexual activity: Yes   Other Topics Concern  . Not on file   Social History Narrative   She lives her family, she has one child, retired, no smoke no drink    Family History  Problem Relation Age of Onset  . Emphysema Father     PGF, brother, sister  . COPD Father     PGF, brother, sister  . Heart disease Father   . Rheum arthritis Mother   . Stroke Mother   . Heart disease Mother     Father, brother, sister  . Lung cancer Paternal Grandfather   . Ovarian cancer Paternal Aunt   . Breast cancer Cousin   . Cancer Brother   . Heart disease Brother   . Heart disease Sister     ROS General: Negative; No fevers, chills, or night sweats;  HEENT: Negative; No changes in vision or hearing, sinus congestion, difficulty swallowing Pulmonary: Negative; No cough, wheezing, shortness of breath, hemoptysis Cardiovascular: Negative; No chest pain, presyncope, syncope, palpitations GI: Negative; No nausea, vomiting, diarrhea, or abdominal pain GU: Negative; No dysuria, hematuria, or difficulty voiding Musculoskeletal: she complains of chronic back pain.  Hematologic/Oncology: Negative; no easy bruising, bleeding Endocrine: Negative; no heat/cold intolerance; no diabetes Neuro: Negative; no changes in balance, headaches Skin: Negative; No rashes or skin lesions Psychiatric: Positive for  anxiety/depression on Wellbutrin Sleep: Negative; No snoring, daytime sleepiness, hypersomnolence, bruxism, restless legs, hypnogognic hallucinations, no cataplexy Other comprehensive 14 point system review is negative.   PE BP 100/80   Pulse (!) 53   Ht 5' 4"  (1.626 m)   Wt 118 lb (53.5 kg)   BMI 20.25 kg/m    Repeat BP By me was 150/84 supine and 140/80 standing.  She states her blood pressure at home typically runs in the 240 to 9:73 systolic range.  Wt Readings from Last 3 Encounters:  04/01/16 118 lb (53.5 kg)  03/23/16 118 lb 6.4 oz (53.7 kg)  12/31/15 119 lb (54 kg)   General: Alert, oriented,  no distress.  Skin: normal turgor, no rashes HEENT: Normocephalic, atraumatic. Pupils round and reactive; sclera anicteric;no lid lag. Extraocular muscles intact;; no xanthelasmas. Nose without nasal septal hypertrophy Mouth/Parynx benign; Mallinpatti scale 2 Neck: No JVD, without clearly discernible carotid bruits; normal carotid upstroke Lungs: clear to ausculatation and percussion; no wheezing or rales Chest wall: no tenderness to palpitation; mild pectus excavatum Heart: RRR, s1 s2 normal; 7-4/8 systolic murmur in the aortic region; no diastolic murmur, rub thrills or heaves Abdomen: soft, nontender; no hepatosplenomehaly, BS+; abdominal aorta nontender and not dilated by palpation. Back: no CVA tenderness Pulses 2+ Extremities: no clubbing cyanosis or edema, Homan's sign negative  Neurologic: grossly nonfocal; cranial nerves grossly normal. Psychologic: normal affect and mood.  ECG (independently read by me): Sinus bradycardia 53 bpm..  Mild RV conduction delay.  Normal intervals.  November 2017 ECG (independently read by me): Normal sinus rhythm at 61 bpm.  No ectopy.  Normal intervals.  July 2017 ECG (independently read by me): Sinus rhythm at 60 bpm with PAC.  Normal intervals.  November 2016 ECG (independently read by me): Normal sinus rhythm at 66 bpm.  Mild RV  conduction delay.  Nondiagnostic small inferior Q waves.  ECG (independently read by me): sinus bradycardia 50 bpm.  Mild RV conduction delay  November 2015 ECG (independently read by me0;  Normal sinus rhythm at 77 bpm.  RV conduction delay.  Nondiagnostic small Q waves.  Nonspecific ST changes.  April 2015 ECG (independently read by me): Sinus bradycardia 54 beats per minute.  Mild RV conduction delay.  No ectopy.  LABS: BMP Latest Ref Rng & Units 03/25/2016 03/18/2015 12/11/2014  Glucose 65 - 99 mg/dL 95 98 90  BUN 6 - 20 mg/dL 13 16 15   Creatinine 0.44 - 1.00 mg/dL 1.07(H) 1.02 1.03(H)  Sodium 135 - 145 mmol/L 139 139 139  Potassium 3.5 - 5.1 mmol/L 4.2 4.8 4.9  Chloride 101 - 111 mmol/L 106 103 105  CO2 22 - 32 mmol/L 27 29 27   Calcium 8.9 - 10.3 mg/dL 9.5 10.4 9.2   Hepatic Function Latest Ref Rng & Units 03/25/2016 12/11/2014 11/28/2014  Total Protein 6.5 - 8.1 g/dL 7.2 6.4 -  Albumin 3.5 - 5.0 g/dL 4.4 3.9 -  AST 15 - 41 U/L 23 19 20   ALT 14 - 54 U/L 14 15 12   Alk Phosphatase 38 - 126 U/L 73 80 91  Total Bilirubin 0.3 - 1.2 mg/dL 0.6 0.5 -  Bilirubin, Direct 0.0 - 0.3 mg/dL - - -   CBC Latest Ref Rng & Units 03/25/2016 12/11/2014 11/28/2014  WBC 3.6 - 11.0 K/uL 3.4(L) 5.0 3.3  Hemoglobin 12.0 - 16.0 g/dL 13.8 14.3 14.3  Hematocrit 35.0 - 47.0 % 40.5 42.5 41  Platelets 150 - 440 K/uL 170 229 202   Lab Results  Component Value Date   MCV 95.1 03/25/2016   MCV 95.5 12/11/2014   MCV 94.7 05/04/2013   Lab Results  Component Value Date   TSH 5.358 (H) 03/25/2016  No results found for: HGBA1C  Lipid Panel     Component Value Date/Time   CHOL 215 (H) 03/25/2016 0722   CHOL 203 (H) 05/22/2011 0759   TRIG 71 03/25/2016 0722   TRIG 65 05/22/2011 0759   HDL 87 03/25/2016 0722   HDL 88 (H) 05/22/2011 0759   CHOLHDL 2.5 03/25/2016 0722   VLDL 14 03/25/2016 0722   VLDL 13 05/22/2011 0759   LDLCALC 114 (H) 03/25/2016 2707  LDLCALC 102 (H) 05/22/2011 0759   LDLDIRECT 102.2  04/23/2006 1211     RADIOLOGY: No results found.  IMPRESSION:  1. Essential hypertension   2. Pure hypercholesterolemia   3. Palpitations   4. SVT (supraventricular tachycardia) ([facility])   5. Carotid artery plaque, unspecified laterality      ASSESSMENT AND PLAN: Ms. Sala Tague is a 71 year old female who has a history of SVT as well as hypertension.  She had been maintained on long-acting Cardizem 120 g daily in addition to losartan 75 mg daily.  Palpitations have improved with the titration of Cardizem which she now takes 2 at 40 mg at bedtime.  She has continued to take losartan 75 mg in the morning.  She will continue to monitor blood pressure and if her systolic blood pressure continues to be in the 140s further titration of losartan to 100 mg will be recommended.  Her ECG remained stable.  I reviewed her laboratory.  I discussed improvement in diet.  She does not eat much red meat.  She denies significant sugar intake.  Her BMI is excellent at 20.25.  At present, she first not to be on treatment for her lipids.  We discussed trying to get LDL less than 100.  As long as she remains stable, I'll see her in one year for reevaluation.  Time spent: 25 minutes  Troy Sine, MD, [facility]  04/01/2016 7:01 PM

## 2016-04-02 ENCOUNTER — Ambulatory Visit
Admission: RE | Admit: 2016-04-02 | Discharge: 2016-04-02 | Disposition: A | Discharge status: Home / Self Care | Payer: Medicare Other | Source: Ambulatory Visit | Attending: Internal Medicine | Admitting: Internal Medicine

## 2016-04-02 ENCOUNTER — Ambulatory Visit (INDEPENDENT_AMBULATORY_CARE_PROVIDER_SITE_OTHER): Payer: Medicare Other

## 2016-04-02 ENCOUNTER — Ambulatory Visit: Payer: Medicare Other

## 2016-04-02 DIAGNOSIS — Z1231 Encounter for screening mammogram for malignant neoplasm of breast: Secondary | ICD-10-CM | POA: Insufficient documentation

## 2016-04-02 DIAGNOSIS — E538 Deficiency of other specified B group vitamins: Secondary | ICD-10-CM

## 2016-04-02 MED ORDER — CYANOCOBALAMIN 1000 MCG/ML IJ SOLN
1000.0000 ug | Freq: Once | INTRAMUSCULAR | Status: AC
  Administered 2016-04-02: 1000 ug via INTRAMUSCULAR

## 2016-04-02 NOTE — Progress Notes (Addendum)
Patient comes in for B 12 injection.  Injected right  deltoid.  Patient tolerated injection well.    I have reviewed the above information and agree with above.   Teresa Tullo, MD 

## 2016-04-24 ENCOUNTER — Encounter: Payer: Self-pay | Admitting: Family Medicine

## 2016-04-24 ENCOUNTER — Ambulatory Visit (INDEPENDENT_AMBULATORY_CARE_PROVIDER_SITE_OTHER): Payer: Medicare Other | Admitting: Family Medicine

## 2016-04-24 DIAGNOSIS — B349 Viral infection, unspecified: Secondary | ICD-10-CM | POA: Diagnosis not present

## 2016-04-24 DIAGNOSIS — I6529 Occlusion and stenosis of unspecified carotid artery: Secondary | ICD-10-CM

## 2016-04-24 MED ORDER — ONDANSETRON HCL 4 MG PO TABS: 4.0000 mg | ORAL_TABLET | Freq: Three times a day (TID) | ORAL | 0 refills | Status: DC | PRN

## 2016-04-24 MED ORDER — HYDROCODONE-HOMATROPINE 5-1.5 MG/5ML PO SYRP: 2.5000 mL | ORAL_SOLUTION | Freq: Three times a day (TID) | ORAL | 0 refills | Status: DC | PRN

## 2016-04-24 NOTE — Assessment & Plan Note (Signed)
New acute problem. Exam unremarkable. Likely viral in etiology. Treating with Hycodan and Zofran.

## 2016-04-24 NOTE — Progress Notes (Signed)
Pre visit review using our clinic review tool, if applicable. No additional management support is needed unless otherwise documented below in the visit note. 

## 2016-04-24 NOTE — Progress Notes (Signed)
Subjective:  Patient ID: Chloe Perkins, female    DOB: 1945/03/18  Age: 71 y.o. MRN: [id number]  CC: Cough, HA, Nausea  HPI:  71 year old female presents with the above complaints.  Patient states that she has not felt well for the past few days. She states that she's had a nonproductive cough, sore throat, headache, and nausea. Cough is dry. Symptoms are moderate in severity. She's been using Mucinex without improvement. No associated fever. No known exacerbating or relieving factors. No other complaints or concerns at this time.  Social Hx   Social History   Social History  . Marital status: Married    Spouse name: sammy Alesi  . Number of children: 1  . Years of education: N/A   Occupational History  . Retired    Social History Main Topics  . Smoking status: Former Smoker    Packs/day: 0.50    Years: 20.00    Types: Cigarettes    Quit date: 02/09/1990  . Smokeless tobacco: Never Used  . Alcohol use No  . Drug use: No  . Sexual activity: Yes   Other Topics Concern  . None   Social History Narrative   She lives her family, she has one child, retired, no smoke no drink    Review of Systems  Constitutional: Negative for fever.  HENT: Positive for sore throat.   Respiratory: Positive for cough.   Gastrointestinal: Positive for nausea.   Objective:  BP (!) 144/72   Pulse 71   Temp 98.6 F (37 C) (Oral)   Wt 120 lb (54.4 kg)   SpO2 93%   BMI 20.60 kg/m   BP/Weight 04/24/2016 04/01/2016 1/63/8453  Systolic BP 646 803 212  Diastolic BP 72 80 64  Wt. (Lbs) 120 118 118.4  BMI 20.6 20.25 20.32   Physical Exam  Constitutional: She is oriented to person, place, and time. She appears well-developed. No distress.  HENT:  Mouth/Throat: Oropharynx is clear and moist.  Cardiovascular: Normal rate and regular rhythm.   Pulmonary/Chest: Effort normal and breath sounds normal.  Abdominal: Soft. She exhibits no distension. There is no tenderness.  Neurological:  She is alert and oriented to person, place, and time.  Vitals reviewed.   Lab Results  Component Value Date   WBC 3.4 (L) 03/25/2016   HGB 13.8 03/25/2016   HCT 40.5 03/25/2016   PLT 170 03/25/2016   GLUCOSE 95 03/25/2016   CHOL 215 (H) 03/25/2016   TRIG 71 03/25/2016   HDL 87 03/25/2016   LDLDIRECT 102.2 04/23/2006   LDLCALC 114 (H) 03/25/2016   ALT 14 03/25/2016   AST 23 03/25/2016   NA 139 03/25/2016   K 4.2 03/25/2016   CL 106 03/25/2016   CREATININE 1.07 (H) 03/25/2016   BUN 13 03/25/2016   CO2 27 03/25/2016   TSH 5.358 (H) 03/25/2016   MICROALBUR 0.3 12/13/2013    Assessment & Plan:   Problem List Items Addressed This Visit    Viral illness    New acute problem. Exam unremarkable. Likely viral in etiology. Treating with Hycodan and Zofran.        Meds ordered this encounter  Medications  . HYDROcodone-homatropine (HYCODAN) 5-1.5 MG/5ML syrup    Sig: Take 2.5-5 mLs by mouth every 8 (eight) hours as needed for cough.    Dispense:  120 mL    Refill:  0  . ondansetron (ZOFRAN) 4 MG tablet    Sig: Take 1 tablet (4 mg total) by mouth  every 8 (eight) hours as needed for nausea or vomiting.    Dispense:  20 tablet    Refill:  0   Follow-up: PRN  [place]

## 2016-04-24 NOTE — Patient Instructions (Signed)
This is viral. ° °Medications as prescribed. ° °Take care ° °Dr. Yared Barefoot  °

## 2016-05-05 ENCOUNTER — Ambulatory Visit (INDEPENDENT_AMBULATORY_CARE_PROVIDER_SITE_OTHER): Payer: Medicare Other | Admitting: *Deleted

## 2016-05-05 DIAGNOSIS — E538 Deficiency of other specified B group vitamins: Secondary | ICD-10-CM | POA: Diagnosis not present

## 2016-05-05 MED ORDER — CYANOCOBALAMIN 1000 MCG/ML IJ SOLN
1000.0000 ug | Freq: Once | INTRAMUSCULAR | Status: AC
  Administered 2016-05-05: 1000 ug via INTRAMUSCULAR

## 2016-05-05 NOTE — Progress Notes (Addendum)
Patient presented for B 12 injection to left deltoid, voiced no discomfort during injection and showed no signs of discomfort during injection.  Reviewed.  Dr Nicki Reaper

## 2016-05-19 ENCOUNTER — Encounter (INDEPENDENT_AMBULATORY_CARE_PROVIDER_SITE_OTHER): Payer: Self-pay | Admitting: Orthopedic Surgery

## 2016-05-19 ENCOUNTER — Ambulatory Visit (INDEPENDENT_AMBULATORY_CARE_PROVIDER_SITE_OTHER): Payer: Medicare Other | Admitting: Orthopedic Surgery

## 2016-05-19 ENCOUNTER — Ambulatory Visit (INDEPENDENT_AMBULATORY_CARE_PROVIDER_SITE_OTHER): Payer: Medicare Other

## 2016-05-19 VITALS — Ht 64.0 in | Wt 120.0 lb

## 2016-05-19 DIAGNOSIS — M4302 Spondylolysis, cervical region: Secondary | ICD-10-CM | POA: Diagnosis not present

## 2016-05-19 DIAGNOSIS — M542 Cervicalgia: Secondary | ICD-10-CM

## 2016-05-19 DIAGNOSIS — I6529 Occlusion and stenosis of unspecified carotid artery: Secondary | ICD-10-CM | POA: Diagnosis not present

## 2016-05-19 MED ORDER — PREDNISONE 10 MG PO TABS: 20.0000 mg | ORAL_TABLET | Freq: Every day | ORAL | 0 refills | Status: DC

## 2016-05-19 NOTE — Progress Notes (Signed)
Office Visit Note   Patient: Chloe Perkins           Date of Birth: [birth year]           MRN: [id number] Visit Date: 05/19/2016              Requested by: [name], MD [address] [place], [place] [address] PCP: [name], MD  Chief Complaint  Patient presents with  . Neck - Pain      HPI: Patient is 71 year old woman who states that she's had right-sided neck pain for about 5-6 days denies any specific injury states she has decreased range of motion pain radiates to the base of her skull and down to her shoulder. She states she has pain with sitting or standing and has tingling in the posterior aspect of her neck tramadol has not provided any relief she has no radicular symptoms no weakness. Patient states she did go to an urgent care and receive the prescription for baclofen and Ultram which have not helped.  Assessment & Plan: Visit Diagnoses:  1. Cervicalgia   2. Spondylolysis, cervical region     Plan: Patient is given a prescription for physical therapy for range of motion modalities traction. She is called a prescription for prednisone to take 20 mg a day and wean off as she feels better follow-up in 4 weeks if she is still symptomatic. She has no radicular symptoms.  Follow-Up Instructions: Return if symptoms worsen or fail to improve.   Ortho Exam  Patient is alert, oriented, no adenopathy, well-dressed, normal affect, normal respiratory effort. Patient has a normal gait. Examination she has full range of motion of cervical spine with flexion and lateral rotation and lateral bending. She has tenderness to palpation the paraspinous muscles on the right. The vertebral processes are nontender to palpation the thoracic outlet is nontender to palpation she has good motor strength in all motor groups of both upper extremities with no focal motor weakness no radicular symptoms.  Imaging: Xr Cervical Spine 2 Or 3 Views  Result Date:  05/19/2016 Two-view radiographs of the cervical spine does show some degenerative disc disease with some osteophytic bone spurs there is some joint space narrowing there are no apical lung lesions.   Labs: Lab Results  Component Value Date   ESRSEDRATE 4 05/04/2013   ESRSEDRATE 4 06/23/2012   ESRSEDRATE 31 (H) 05/25/2012   CRP 2.0 06/23/2012   LABORGA NO GROWTH 02/07/2015    Orders:  Orders Placed This Encounter  Procedures  . XR Cervical Spine 2 or 3 views   Meds ordered this encounter  Medications  . predniSONE (DELTASONE) 10 MG tablet    Sig: Take 2 tablets (20 mg total) by mouth daily with breakfast. Wean off prednisone as symptoms resolve    Dispense:  60 tablet    Refill:  0     Procedures: No procedures performed  Clinical Data: No additional findings.  ROS:  All other systems negative, except as noted in the HPI. Review of Systems  Objective: Vital Signs: Ht 5\' 4"  (1.626 m)   Wt 120 lb (54.4 kg)   BMI 20.60 kg/m   Specialty Comments:  No specialty comments available.  PMFS History: Patient Active Problem List   Diagnosis Date Noted  . 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 Viral illness 04/24/2016  . Solitary pulmonary nodule 05/04/2015  . B12 deficiency 05/04/2015  . Orthostasis 03/19/2015  . Dizziness and giddiness 03/19/2015  .  Cough 03/19/2015  . Encounter for preventive health examination 03/19/2015  . S/P hysterectomy 06/26/2013  . CKD (chronic kidney disease) stage 3, GFR 30-59 ml/min 06/26/2013  . Hypothyroidism 06/26/2013  . SVT (supraventricular tachycardia) ([place]) 05/29/2013  . Mitral valve prolapse   . HLD (hyperlipidemia)   . Headache(784.0) 05/23/2012  . Anxiety and depression 09/03/2010  . IBS (irritable bowel syndrome) 09/03/2010  . HYPERCHOLESTEROLEMIA, BORDERLINE 02/06/2008  . Essential hypertension 02/06/2008  . Osteoporosis 02/06/2008  . PERSONAL HISTORY OF MALIGNANT MELANOMA OF SKIN 02/06/2008   Past Medical  History:  Diagnosis Date  . Anxiety and depression   . Asthmatic bronchitis   . Cancer ([place])    MELANOMA  . Hemorrhoids   . History of melanoma   . HLD (hyperlipidemia)   . Hypertension   . Mitral valve prolapse   . Osteoporosis   . Personal history of malignant melanoma of skin   . SVT (supraventricular tachycardia) ([place])   . Viral gastroenteritis     Family History  Problem Relation Age of Onset  . Emphysema Father     PGF, brother, sister  . COPD Father     PGF, brother, sister  . Heart disease Father   . Rheum arthritis Mother   . Stroke Mother   . Heart disease Mother     Father, brother, sister  . Lung cancer Paternal Grandfather   . Ovarian cancer Paternal Aunt   . Breast cancer Cousin   . Cancer Brother   . Heart disease Brother   . Heart disease Sister     Past Surgical History:  Procedure Laterality Date  . ABDOMINAL HYSTERECTOMY  1975  . COLONOSCOPY  04/15/2004   2006: Normal  . ENDOMETRIAL ABLATION  1976  . left thumb tendon repair  10/09  . MELANOMA EXCISION  1999   superficial spreading melanoma right post shoulder  . NM MYOCAR PERF WALL MOTION  12/28/2008   No ischemia  . SKIN CANCER EXCISION  03/02/2013   superficial basal cell cancer  . US ECHOCARDIOGRAPHY  10/16/2010   mitral valve leaflets midly thickened,trace MR.  . WRIST SURGERY Left 2011   Social History   Occupational History  . Retired    Social History Main Topics  . Smoking status: Former Smoker    Packs/day: 0.50    Years: 20.00    Types: Cigarettes    Quit date: 02/09/1990  . Smokeless tobacco: Never Used  . Alcohol use No  . Drug use: No  . Sexual activity: Yes

## 2016-06-09 ENCOUNTER — Ambulatory Visit (INDEPENDENT_AMBULATORY_CARE_PROVIDER_SITE_OTHER): Payer: Medicare Other | Admitting: *Deleted

## 2016-06-09 DIAGNOSIS — E538 Deficiency of other specified B group vitamins: Secondary | ICD-10-CM

## 2016-06-09 MED ORDER — CYANOCOBALAMIN 1000 MCG/ML IJ SOLN
1000.0000 ug | Freq: Once | INTRAMUSCULAR | Status: AC
  Administered 2016-06-09: 1000 ug via INTRAMUSCULAR

## 2016-06-09 NOTE — Progress Notes (Addendum)
Patient presented for B 12 injection to right deltoid , patient voiced no concerns and showed no signs of discomfort during injection.  Reviewed.  Dr Nicki Reaper

## 2016-07-15 ENCOUNTER — Ambulatory Visit: Payer: Medicare Other

## 2016-07-16 ENCOUNTER — Ambulatory Visit (INDEPENDENT_AMBULATORY_CARE_PROVIDER_SITE_OTHER): Payer: Medicare Other | Admitting: *Deleted

## 2016-07-16 DIAGNOSIS — E538 Deficiency of other specified B group vitamins: Secondary | ICD-10-CM

## 2016-07-16 MED ORDER — CYANOCOBALAMIN 1000 MCG/ML IJ SOLN
1000.0000 ug | Freq: Once | INTRAMUSCULAR | Status: AC
  Administered 2016-07-16: 1000 ug via INTRAMUSCULAR

## 2016-07-16 NOTE — Progress Notes (Signed)
  I have reviewed the above information and agree with above.   Bresha Hosack, MD 

## 2016-07-16 NOTE — Progress Notes (Signed)
Patient presented for B 12 injection to left deltoid, patient voiced no concerns nor showed any signs of distress during injection. 

## 2016-07-18 ENCOUNTER — Other Ambulatory Visit: Payer: Self-pay | Admitting: Cardiovascular Disease

## 2016-08-19 ENCOUNTER — Ambulatory Visit (INDEPENDENT_AMBULATORY_CARE_PROVIDER_SITE_OTHER): Payer: Medicare Other | Admitting: *Deleted

## 2016-08-19 DIAGNOSIS — E538 Deficiency of other specified B group vitamins: Secondary | ICD-10-CM

## 2016-08-19 MED ORDER — CYANOCOBALAMIN 1000 MCG/ML IJ SOLN
1000.0000 ug | Freq: Once | INTRAMUSCULAR | Status: AC
  Administered 2016-08-19: 1000 ug via INTRAMUSCULAR

## 2016-08-19 NOTE — Progress Notes (Addendum)
Patient presented for B 12 injection to right deltoid, patient voiced no concerns nor showed any signs of distress during injection.  Reviewed.  Dr Scott 

## 2016-09-23 ENCOUNTER — Ambulatory Visit (INDEPENDENT_AMBULATORY_CARE_PROVIDER_SITE_OTHER): Payer: Medicare Other | Admitting: *Deleted

## 2016-09-23 ENCOUNTER — Ambulatory Visit: Payer: Medicare Other

## 2016-09-23 DIAGNOSIS — E538 Deficiency of other specified B group vitamins: Secondary | ICD-10-CM

## 2016-09-23 MED ORDER — CYANOCOBALAMIN 1000 MCG/ML IJ SOLN
1000.0000 ug | Freq: Once | INTRAMUSCULAR | Status: AC
  Administered 2016-09-23: 1000 ug via INTRAMUSCULAR

## 2016-09-23 NOTE — Progress Notes (Signed)
Patient presented for B 12 injection to left deltoid, patient voiced no concerns nor showed any signs of distress during injection. 

## 2016-09-30 ENCOUNTER — Ambulatory Visit: Payer: Medicare Other

## 2016-10-27 ENCOUNTER — Encounter: Payer: Self-pay | Admitting: Internal Medicine

## 2016-10-27 ENCOUNTER — Ambulatory Visit (INDEPENDENT_AMBULATORY_CARE_PROVIDER_SITE_OTHER): Payer: Medicare Other | Admitting: Internal Medicine

## 2016-10-27 VITALS — BP 142/70 | HR 57 | Temp 98.0°F | Resp 14 | Ht 64.0 in | Wt 119.0 lb

## 2016-10-27 DIAGNOSIS — R911 Solitary pulmonary nodule: Secondary | ICD-10-CM

## 2016-10-27 DIAGNOSIS — E039 Hypothyroidism, unspecified: Secondary | ICD-10-CM | POA: Diagnosis not present

## 2016-10-27 DIAGNOSIS — R42 Dizziness and giddiness: Secondary | ICD-10-CM | POA: Diagnosis not present

## 2016-10-27 DIAGNOSIS — R918 Other nonspecific abnormal finding of lung field: Secondary | ICD-10-CM | POA: Diagnosis not present

## 2016-10-27 DIAGNOSIS — E538 Deficiency of other specified B group vitamins: Secondary | ICD-10-CM

## 2016-10-27 DIAGNOSIS — F329 Major depressive disorder, single episode, unspecified: Secondary | ICD-10-CM

## 2016-10-27 DIAGNOSIS — M810 Age-related osteoporosis without current pathological fracture: Secondary | ICD-10-CM | POA: Diagnosis not present

## 2016-10-27 DIAGNOSIS — I10 Essential (primary) hypertension: Secondary | ICD-10-CM

## 2016-10-27 DIAGNOSIS — F419 Anxiety disorder, unspecified: Secondary | ICD-10-CM | POA: Diagnosis not present

## 2016-10-27 DIAGNOSIS — I6529 Occlusion and stenosis of unspecified carotid artery: Secondary | ICD-10-CM | POA: Diagnosis not present

## 2016-10-27 MED ORDER — CYANOCOBALAMIN 1000 MCG/ML IJ SOLN
1000.0000 ug | Freq: Once | INTRAMUSCULAR | Status: AC
Start: 2016-10-27 — End: 2016-10-27
  Administered 2016-10-27: 1000 ug via INTRAMUSCULAR

## 2016-10-27 NOTE — Assessment & Plan Note (Addendum)
With no history of fractures,   Alendronate inolerant.  Prolia advised but deferred by patient. continue calcium supplementation.

## 2016-10-27 NOTE — Assessment & Plan Note (Addendum)
Found In 2017 after an Er evaluation noted a nodule.  1 cm posterior right sided,  By Chest Ct .  Thought to be seen on 2007 Ct.  One year follow up advised .  She has a remote history of tobacco abuse.

## 2016-10-27 NOTE — Assessment & Plan Note (Signed)
Subclinical..  Intolerant of low dose synthroid

## 2016-10-27 NOTE — Patient Instructions (Addendum)
Taking an antibiotic can create an imbalance in the normal population of bacteria that live in the small intestine.  This imbalance can persist for 3 months.   Taking a probiotic ( Align, Floraque or Culturelle), the generic version of one of these over the counter medications, or an alternative form (kombucha,  Yogurt, or another dietary source) for a minimum of 3 weeks may help prevent a serious antibiotic associated diarrhea  Called clostridium dificile colitis that occurs when the bacteria population is altered .  Taking a probiotic may also prevent vaginitis due to yeast infections and can be continued indefinitely If you feel that it improves your digestion or your elimination (bowels).     Please consider starting Prolia to prevent fractures and improve your bone density  Please reconsider putting off your colonoscopy   Your chest CT has been ordered to follow up on your right sided lung nodule.  If it has not changed since last year,  this will the  LAST ONE

## 2016-10-27 NOTE — Assessment & Plan Note (Signed)
Managed by Dr Plovsky with twice daily lorazepam and wellbutrin 

## 2016-10-27 NOTE — Assessment & Plan Note (Addendum)
Occurs with supine position, chronic.  No hemodynamically significant carotid stenosis by doppler studies.

## 2016-10-27 NOTE — Progress Notes (Signed)
Patient ID: Chloe Perkins, female    DOB: 02/04/1946  Age: 71 y.o. MRN: [id number]  The patient is here for  management of other chronic and acute problems.Last seen March 2017 by me.   :  Defers breast exam, mammogram normal in February 2018. DEFERS COLONOScOPY. Last one 2006 . 5 yr follow up was adviseed  DEFERS PROLIA FOR MANAGEMENT OF OSTEOPOROSIS DEFERS FLU VACCINE   Osteoporosis, did not tolerate fosomax due to persisent  Nausea. Marland Kitchen disucssed Prolia,  Does not want it  Walking daily fro 30 minutes.   No chest pain.  Sees Chloe Perkins annually  Last colonoscopy 2006 5 yr follow up advised.  Not done   Does ont want to have anything doe right done.  Bowels move  Daily no constipation   Chronic dizziness  with supine position    The risk factors are reflected in the social history.  The roster of all physicians providing medical care to patient - is listed in the Snapshot section of the chart.  Activities of daily living:  The patient is 100% independent in all ADLs: dressing, toileting, feeding as well as independent mobility  Home safety : The patient has smoke detectors in the home. They wear seatbelts.  There are no firearms at home. There is no violence in the home.   There is no risks for hepatitis, STDs or HIV. There is no   history of blood transfusion. They have no travel history to infectious disease endemic areas of the world.  The patient has seen their dentist in the last six month. They have seen their eye doctor in the last year. T They have deferred audiologic testing in the last year.  They do not  have excessive sun exposure. Discussed the need for sun protection: hats, long sleeves and use of sunscreen if there is significant sun exposure.   Diet: the importance of a healthy diet is discussed. They do have a healthy diet. Depression screen: there are no signs or vegative symptoms of untreated depression- irritability, change in apetite, anhedonia,  sadness/tearfullness.  She is under treatment  for depression by Chloe Chloe Perkins  Cognitive assessment: the patient manages all their financial and personal affairs and is actively engaged. They could relate day,date,year and events; recalled 2/3 objects at 3 minutes; performed clock-face test normally.  The following portions of the patient's history were reviewed and updated as appropriate: allergies, current medications, past family history, past medical history,  past surgical history, past social history  and problem list.  Visual acuity was not assessed per patient preference since she has regular follow up with her ophthalmologist. Hearing and body mass index were assessed and reviewed.   During the course of the visit the patient was educated and counseled about appropriate screening and preventive services including : fall prevention , diabetes screening, nutrition counseling, colorectal cancer screening, and recommended immunizations.    CC: The primary encounter diagnosis was Solitary pulmonary nodule on lung CT. Diagnoses of Abnormal findings on diagnostic imaging of lung, B12 deficiency, Essential hypertension, Age-related osteoporosis without current pathological fracture, Anxiety and depression, Hypothyroidism, unspecified type, Dizziness and giddiness, and Solitary pulmonary nodule were also pertinent to this visit.    Getting PT for neck arthritis  Has been having dysuria.  Since Friday,  Current being treated for UTI with cipro for Pyuria  Last UTI was years ago  Mild nausea  From the antibiotic    Using lorazepam twice daily described by Chloe Perkins  Chloe. Casimiro Needle (  along with wellbutrin)   Blood pressure medications prescribed by heart doctor  Chloe Perkins.   History Chloe Perkins has a past medical history of Anxiety and depression; Asthmatic bronchitis; Cancer ([place]); Hemorrhoids; History of melanoma; HLD (hyperlipidemia); Hypertension; Mitral valve prolapse; Osteoporosis; Personal history of  malignant melanoma of skin; SVT (supraventricular tachycardia) ([place]); and Viral gastroenteritis.   She has a past surgical history that includes Endometrial ablation (1976); Melanoma excision (1999); left thumb tendon repair (10/09); Abdominal hysterectomy (1975); Colonoscopy (04/15/2004); Wrist surgery (Left, 2011); Skin cancer excision (03/02/2013); US ECHOCARDIOGRAPHY (10/16/2010); and NM MYOCAR PERF WALL MOTION (12/28/2008).   Her family history includes Breast cancer in her cousin; COPD in her father; Cancer in her brother; Emphysema in her father; Heart disease in her brother, father, mother, and sister; Lung cancer in her paternal grandfather; Ovarian cancer in her paternal aunt; Rheum arthritis in her mother; Stroke in her mother.She reports that she quit smoking about 26 years ago. Her smoking use included Cigarettes. She has a 10.00 pack-year smoking history. She has never used smokeless tobacco. She reports that she does not drink alcohol or use drugs.  Outpatient Medications Prior to Visit  Medication Sig Dispense Refill  . buPROPion (WELLBUTRIN XL) 300 MG 24 hr tablet Take 300 mg by mouth daily.    . calcium-vitamin D (OSCAL WITH D) 500-200 MG-UNIT per tablet Take 1 tablet by mouth 2 (two) times daily.      Marland Kitchen LORazepam (ATIVAN) 0.5 MG tablet Take 0.5 mg by mouth 2 (two) times daily.     Marland Kitchen losartan (COZAAR) 50 MG tablet TAKE 1 AND 1/2 TABLETS(75 MG) BY MOUTH DAILY 135 tablet 2  . traMADol (ULTRAM) 50 MG tablet     . diltiazem (CARDIZEM CD) 240 MG 24 hr capsule Take 1 capsule (240 mg total) by mouth daily. 90 capsule 3  . ondansetron (ZOFRAN) 4 MG tablet Take 1 tablet (4 mg total) by mouth every 8 (eight) hours as needed for nausea or vomiting. (Patient not taking: Reported on 10/27/2016) 20 tablet 0  . predniSONE (DELTASONE) 10 MG tablet Take 2 tablets (20 mg total) by mouth daily with breakfast. Wean off prednisone as symptoms resolve (Patient not taking: Reported on 10/27/2016) 60 tablet 0    No facility-administered medications prior to visit.     Review of Systems   Patient denies headache, fevers, malaise, unintentional weight loss, skin rash, eye pain, sinus congestion and sinus pain, sore throat, dysphagia,  hemoptysis , cough, dyspnea, wheezing, chest pain, palpitations, orthopnea, edema, abdominal pain, nausea, melena, diarrhea, constipation, flank pain, dysuria, hematuria, urinary  Frequency, nocturia, numbness, tingling, seizures,  Focal weakness, Loss of consciousness,  Tremor, insomnia, depression, anxiety, and suicidal ideation.      Objective:  BP (!) 142/70 (BP Location: Left Arm, Patient Position: Sitting, Cuff Size: Normal)   Pulse (!) 57   Temp 98 F (36.7 C) (Oral)   Resp 14   Ht 5\' 4"  (1.626 m)   Wt 119 lb (54 kg)   SpO2 97%   BMI 20.43 kg/m   Physical Exam   General appearance: alert, cooperative and appears stated age Ears: normal TM's and external ear canals both ears Throat: lips, mucosa, and tongue normal; teeth and gums normal Neck: no adenopathy, no carotid bruit, supple, symmetrical, trachea midline and thyroid not enlarged, symmetric, no tenderness/mass/nodules Back: symmetric, no curvature. ROM normal. No CVA tenderness. Lungs: clear to auscultation bilaterally Heart: regular rate and rhythm, S1, S2 normal, no murmur, click, rub or  gallop Abdomen: soft, non-tender; bowel sounds normal; no masses,  no organomegaly Pulses: 2+ and symmetric Skin: Skin color, texture, turgor normal. No rashes or lesions Lymph nodes: Cervical, supraclavicular, and axillary nodes normal. Neuro: CNs 2-12 intact. DTRs 2+/4 in biceps, brachioradialis, patellars and achilles. Muscle strength 5/5 in upper and lower exremities. Fine resting tremor bilaterally both hands cerebellar function normal. Romberg negative.  No pronator drift.   Gait normal.     Assessment & Plan:   Problem List Items Addressed This Visit    Anxiety and depression    Managed by Chloe  Chloe Needle with twice daily lorazepam and wellbutrin      B12 deficiency   Relevant Medications   cyanocobalamin ((VITAMIN B-12)) injection 1,000 mcg (Completed)   Dizziness and giddiness    Occurs with supine position, chronic.  No hemodynamically significant carotid stenosis by doppler studies.       Essential hypertension    Well controlled on current regimen. Renal function stable, no changes today.  Lab Results  Component Value Date   CREATININE 1.07 (H) 03/25/2016   .astlyte       Hypothyroidism    Subclinical..  Intolerant of low dose synthroid      Osteoporosis    With no history of fractures,   Alendronate inolerant.  Prolia advised but deferred by patient. continue calcium supplementation.       Solitary pulmonary nodule    Found In 2017 after an Er evaluation noted a nodule.  1 cm posterior right sided,  By Chest Ct .  Thought to be seen on 2007 Ct.  One year follow up advised .  She has a remote history of tobacco abuse.        Other Visit Diagnoses    Solitary pulmonary nodule on lung CT    -  Primary   Relevant Orders   CT CHEST NODULE FOLLOW UP LOW DOSE W/O   Abnormal findings on diagnostic imaging of lung         A total of 40 minutes was spent with patient more than half of which was spent in counseling patient on the above mentioned issues , reviewing and explaining recent labs and imaging studies done, and coordination of care.  I have discontinued Ms. Kurka's ondansetron, traMADol, and predniSONE. I am also having her maintain her calcium-vitamin D, LORazepam, buPROPion, diltiazem, and losartan. We administered cyanocobalamin.  Meds ordered this encounter  Medications  . cyanocobalamin ((VITAMIN B-12)) injection 1,000 mcg    Medications Discontinued During This Encounter  Medication Reason  . ondansetron (ZOFRAN) 4 MG tablet Patient has not taken in last 30 days  . predniSONE (DELTASONE) 10 MG tablet Patient has not taken in last 30 days  .  traMADol (ULTRAM) 50 MG tablet Patient has not taken in last 30 days    Follow-up: Return in about 1 year (around 10/27/2017).   [name], MD

## 2016-10-27 NOTE — Assessment & Plan Note (Signed)
Well controlled on current regimen. Renal function stable, no changes today.  Lab Results  Component Value Date   CREATININE 1.07 (H) 03/25/2016   .astlyte

## 2016-11-02 ENCOUNTER — Encounter: Payer: Self-pay | Admitting: Cardiovascular Disease

## 2016-11-02 ENCOUNTER — Ambulatory Visit (INDEPENDENT_AMBULATORY_CARE_PROVIDER_SITE_OTHER): Payer: Medicare Other | Admitting: Cardiovascular Disease

## 2016-11-02 VITALS — BP 100/66 | HR 64 | Ht 64.0 in | Wt 116.0 lb

## 2016-11-02 DIAGNOSIS — R002 Palpitations: Secondary | ICD-10-CM

## 2016-11-02 DIAGNOSIS — F419 Anxiety disorder, unspecified: Secondary | ICD-10-CM | POA: Diagnosis not present

## 2016-11-02 DIAGNOSIS — I10 Essential (primary) hypertension: Secondary | ICD-10-CM

## 2016-11-02 DIAGNOSIS — I471 Supraventricular tachycardia: Secondary | ICD-10-CM

## 2016-11-02 DIAGNOSIS — F329 Major depressive disorder, single episode, unspecified: Secondary | ICD-10-CM | POA: Diagnosis not present

## 2016-11-02 DIAGNOSIS — I6529 Occlusion and stenosis of unspecified carotid artery: Secondary | ICD-10-CM | POA: Diagnosis not present

## 2016-11-02 DIAGNOSIS — E7800 Pure hypercholesterolemia, unspecified: Secondary | ICD-10-CM

## 2016-11-02 DIAGNOSIS — I341 Nonrheumatic mitral (valve) prolapse: Secondary | ICD-10-CM | POA: Diagnosis not present

## 2016-11-02 DIAGNOSIS — F32A Depression, unspecified: Secondary | ICD-10-CM

## 2016-11-02 MED ORDER — LOSARTAN POTASSIUM 50 MG PO TABS: 75.0000 mg | ORAL_TABLET | Freq: Every day | ORAL | 3 refills | Status: DC

## 2016-11-02 MED ORDER — DILTIAZEM HCL ER COATED BEADS 240 MG PO CP24: 240.0000 mg | ORAL_CAPSULE | Freq: Every day | ORAL | 3 refills | Status: DC

## 2016-11-02 MED ORDER — METOPROLOL SUCCINATE ER 25 MG PO TB24: 12.5000 mg | ORAL_TABLET | Freq: Every day | ORAL | 3 refills | Status: DC

## 2016-11-02 NOTE — Progress Notes (Signed)
Patient ID: Chloe Perkins, female   DOB: 10/02/1945, 71 y.o.   MRN: [id number]    Primary M.D.: Dr. Deborra Medina  HPI: Chloe Perkins is a 71 y.o. female who presents to the office for a 7 month cardiology follow-up evaluation.  Chloe Perkins has a history of SVT and hypertension, for which she has been on diltiazem at 120 mg  and losartan 50 mg daily.  She has  also remote history of thyroid abnormality and in the past  had taken levothyroxine.  She has mild renal insufficiency with stage III renal disease.  Has a history of anxiety/depression for which she takes Wellbutrin XL 300 mg daily.  She also is on calcium with vitamin D in light of osteoporosis.  Recently, she believes that she has been experiencing more episodes of palpitations which are short-lived and at times occur daily.  She denies associated presyncope or syncope.  She denies chest pain.  She recently underwent a panoramix dental x-ray and was told by her dentist that she may have calcification of her carotids.   She has a history of osteoporosis, as well as vitamin B12 deficiency and has previously been found to have stable solitary pulmonary nodule.  She had a follow-up echo Doppler study in November 2016 which showed an ejection fraction at 60-65%.  She had normal diastolic parameters.  There was turbulent flow at the base of her aortic valve in the region of the right coronary cusp which was most likely the cause of her systolic murmur.    I last saw her, she was experiencing  transient palpitations lasting less than 5 seconds, almost on a daily basis.  She has been using caffeine.  She underwent a carotid duplex study in July 2017 which showed heterogeneous plaque bilaterally and 1-39% bilateral ICA stenoses with normal subclavian arteries, and patent vertebral arteries with antegrade flow.  I saw her, I further titrated her Cardizem and suggested that she take 240 mg at bedtime.  She's continued to take losartan 75 mg in the  morning.  Since I last saw her in February 2018, she states that she has continued to experience short-lived episodes of asymptomatic palpitations which may last anywhere from 5-10 seconds, but seemed to occur on a daily basis.  She has been on slow acting diltiazem at 240 mg daily and has continued to take losartan 75 mg daily for hypertension.  She also is on Ativan and Wellbutrin.  She denies any episodes of chest pain.  She denies shortness of breath.  She denies presyncope or syncope.  She denies difficulty with sleep.  She presents for evaluation.  Past Medical History:  Diagnosis Date  . Anxiety and depression   . Asthmatic bronchitis   . Cancer ([place])    MELANOMA  . Hemorrhoids   . History of melanoma   . HLD (hyperlipidemia)   . Hypertension   . Mitral valve prolapse   . Osteoporosis   . Personal history of malignant melanoma of skin   . SVT (supraventricular tachycardia) ([place])   . Viral gastroenteritis     Past Surgical History:  Procedure Laterality Date  . ABDOMINAL HYSTERECTOMY  1975  . COLONOSCOPY  04/15/2004   2006: Normal  . ENDOMETRIAL ABLATION  1976  . left thumb tendon repair  10/09  . MELANOMA EXCISION  1999   superficial spreading melanoma right post shoulder  . NM MYOCAR PERF WALL MOTION  12/28/2008   No ischemia  . SKIN CANCER EXCISION  03/02/2013   superficial basal cell cancer  . US ECHOCARDIOGRAPHY  10/16/2010   mitral valve leaflets midly thickened,trace MR.  . WRIST SURGERY Left 2011    Allergies  Allergen Reactions  . Alendronate Sodium Other (See Comments)    REACTION: pt states INTOL to Fosamax  . Verapamil Other (See Comments)    REACTION: Intol w/ bradycardia    Current Outpatient Prescriptions  Medication Sig Dispense Refill  . buPROPion (WELLBUTRIN XL) 300 MG 24 hr tablet Take 300 mg by mouth daily.    . calcium-vitamin D (OSCAL WITH D) 500-200 MG-UNIT per tablet Take 1 tablet by mouth 2 (two) times daily.      Marland Kitchen diltiazem (CARDIZEM CD)  240 MG 24 hr capsule Take 1 capsule (240 mg total) by mouth daily. 90 capsule 3  . LORazepam (ATIVAN) 0.5 MG tablet Take 0.5 mg by mouth 2 (two) times daily.     Marland Kitchen losartan (COZAAR) 50 MG tablet Take 1.5 tablets (75 mg total) by mouth daily. 135 tablet 3  . metoprolol succinate (TOPROL XL) 25 MG 24 hr tablet Take 0.5 tablets (12.5 mg total) by mouth daily. 45 tablet 3   No current facility-administered medications for this visit.     Social History   Social History  . Marital status: Married    Spouse name: sammy Gaillard  . Number of children: 1  . Years of education: N/A   Occupational History  . Retired    Social History Main Topics  . Smoking status: Former Smoker    Packs/day: 0.50    Years: 20.00    Types: Cigarettes    Quit date: 02/09/1990  . Smokeless tobacco: Never Used  . Alcohol use No  . Drug use: No  . Sexual activity: Yes   Other Topics Concern  . Not on file   Social History Narrative   She lives her family, she has one child, retired, no smoke no drink    Family History  Problem Relation Age of Onset  . Emphysema Father        PGF, brother, sister  . COPD Father        PGF, brother, sister  . Heart disease Father   . Rheum arthritis Mother   . Stroke Mother   . Heart disease Mother        Father, brother, sister  . Lung cancer Paternal Grandfather   . Ovarian cancer Paternal Aunt   . Breast cancer Cousin   . Cancer Brother   . Heart disease Brother   . Heart disease Sister     ROS General: Negative; No fevers, chills, or night sweats;  HEENT: Negative; No changes in vision or hearing, sinus congestion, difficulty swallowing Pulmonary: Negative; No cough, wheezing, shortness of breath, hemoptysis Cardiovascular: Negative; No chest pain, presyncope, syncope, palpitations GI: Negative; No nausea, vomiting, diarrhea, or abdominal pain GU: Negative; No dysuria, hematuria, or difficulty voiding Musculoskeletal: she complains of chronic back pain.   Hematologic/Oncology: Negative; no easy bruising, bleeding Endocrine: Negative; no heat/cold intolerance; no diabetes Neuro: Negative; no changes in balance, headaches Skin: Negative; No rashes or skin lesions Psychiatric: Positive for anxiety/depression on Wellbutrin Sleep: Negative; No snoring, daytime sleepiness, hypersomnolence, bruxism, restless legs, hypnogognic hallucinations, no cataplexy Other comprehensive 14 point system review is negative.   PE BP 100/66   Pulse 64   Ht 5' 4"  (1.626 m)   Wt 116 lb (52.6 kg)   BMI 19.91 kg/m    Repeat BP By  me was 118/70  Wt Readings from Last 3 Encounters:  11/02/16 116 lb (52.6 kg)  10/27/16 119 lb (54 kg)  05/19/16 120 lb (54.4 kg)   General: Alert, oriented, no distress.  Skin: normal turgor, no rashes, warm and dry HEENT: Normocephalic, atraumatic. Pupils equal round and reactive to light; sclera anicteric; extraocular muscles intact;  Nose without nasal septal hypertrophy Mouth/Parynx benign; Mallinpatti scale 2 Neck: No JVD, no carotid bruits; normal carotid upstroke Lungs: clear to ausculatation and percussion; no wheezing or rales Chest wall: without tenderness to palpitation Heart: PMI not displaced, RRR; however, during prolonged auscultation.  I heard a burst of tachycardia for approximately 8-10 beats which resolve spontaneously.  She was aware when questioned if she had felt this, s1 s2 normal, 6-9/6 systolic murmur, no diastolic murmur, no rubs, gallops, thrills, or heaves Abdomen: soft, nontender; no hepatosplenomehaly, BS+; abdominal aorta nontender and not dilated by palpation. Back: no CVA tenderness Pulses 2+ Musculoskeletal: full range of motion, normal strength, no joint deformities Extremities: no clubbing cyanosis or edema, Homan's sign negative  Neurologic: grossly nonfocal; Cranial nerves grossly wnl Psychologic: Normal mood and affect    ECG (independently read by me): Normal sinus rhythm at 64 bpm,  PA-C, mild RV conduction delay.  Nonspecific ST changes.  February 2017 ECG (independently read by me): Sinus bradycardia 53 bpm..  Mild RV conduction delay.  Normal intervals.  November 2017 ECG (independently read by me): Normal sinus rhythm at 61 bpm.  No ectopy.  Normal intervals.  July 2017 ECG (independently read by me): Sinus rhythm at 60 bpm with PAC.  Normal intervals.  November 2016 ECG (independently read by me): Normal sinus rhythm at 66 bpm.  Mild RV conduction delay.  Nondiagnostic small inferior Q waves.  ECG (independently read by me): sinus bradycardia 50 bpm.  Mild RV conduction delay  November 2015 ECG (independently read by me0;  Normal sinus rhythm at 77 bpm.  RV conduction delay.  Nondiagnostic small Q waves.  Nonspecific ST changes.  April 2015 ECG (independently read by me): Sinus bradycardia 54 beats per minute.  Mild RV conduction delay.  No ectopy.  LABS: BMP Latest Ref Rng & Units 03/25/2016 03/18/2015 12/11/2014  Glucose 65 - 99 mg/dL 95 98 90  BUN 6 - 20 mg/dL 13 16 15   Creatinine 0.44 - 1.00 mg/dL 1.07(H) 1.02 1.03(H)  Sodium 135 - 145 mmol/L 139 139 139  Potassium 3.5 - 5.1 mmol/L 4.2 4.8 4.9  Chloride 101 - 111 mmol/L 106 103 105  CO2 22 - 32 mmol/L 27 29 27   Calcium 8.9 - 10.3 mg/dL 9.5 10.4 9.2   Hepatic Function Latest Ref Rng & Units 03/25/2016 12/11/2014 11/28/2014  Total Protein 6.5 - 8.1 g/dL 7.2 6.4 -  Albumin 3.5 - 5.0 g/dL 4.4 3.9 -  AST 15 - 41 U/L 23 19 20   ALT 14 - 54 U/L 14 15 12   Alk Phosphatase 38 - 126 U/L 73 80 91  Total Bilirubin 0.3 - 1.2 mg/dL 0.6 0.5 -  Bilirubin, Direct 0.0 - 0.3 mg/dL - - -   CBC Latest Ref Rng & Units 03/25/2016 12/11/2014 11/28/2014  WBC 3.6 - 11.0 K/uL 3.4(L) 5.0 3.3  Hemoglobin 12.0 - 16.0 g/dL 13.8 14.3 14.3  Hematocrit 35.0 - 47.0 % 40.5 42.5 41  Platelets 150 - 440 K/uL 170 229 202   Lab Results  Component Value Date   MCV 95.1 03/25/2016   MCV 95.5 12/11/2014   MCV 94.7 05/04/2013  Lab Results    Component Value Date   TSH 5.358 (H) 03/25/2016  No results found for: HGBA1C  Lipid Panel     Component Value Date/Time   CHOL 215 (H) 03/25/2016 0722   CHOL 203 (H) 05/22/2011 0759   TRIG 71 03/25/2016 0722   TRIG 65 05/22/2011 0759   HDL 87 03/25/2016 0722   HDL 88 (H) 05/22/2011 0759   CHOLHDL 2.5 03/25/2016 0722   VLDL 14 03/25/2016 0722   VLDL 13 05/22/2011 0759   LDLCALC 114 (H) 03/25/2016 0722   LDLCALC 102 (H) 05/22/2011 0759   LDLDIRECT 102.2 04/23/2006 1211     RADIOLOGY: No results found.  IMPRESSION:  1. Essential hypertension   2. Mitral valve prolapse   3. Pure hypercholesterolemia   4. SVT (supraventricular tachycardia) ([place])   5. Anxiety and depression   6. Palpitations     ASSESSMENT AND PLAN: Ms. Tuana Perkins is a 71 year old female who has a history of SVT as well as hypertension.In the past, she was experiencing more frequent palpitations which led to further dose escalation of Cardizem up to 240 mg with improvement.  Her blood pressure today is stable with this dose of diltiazem in addition to losartan 75 mg daily.  However, on exam today, she again had another asymptomatic burst of increased heart rate, which lasted 5-10 seconds.  She was aware of this sensation.  For this reason, I'm will add low-dose metoprolol succinate since she had had bradycardic issues in the past.  Will initiate this at only 12.5 mg to see if this can work synergistically with her Cardizem and reduce these almost daily sensations.  I have reviewed laboratory from 7 months ago.  At that time her LDL cholesterol was increased at 114.  We discussed trying to get her LDL less than 100 if at all possible, or lower since studies have suggested potential subclinical atherosclerosis development at even  this level.  She will monitor her heart rate and blood pressure.  I will see her in 6 months for follow-up evaluation. Time spent: 25 minutes  Troy Sine, MD, [facility]   11/02/2016 11:46 AM

## 2016-11-02 NOTE — Patient Instructions (Signed)
Medication Instructions:  START metoprolol succinate (Toprol XL) 12.5 mg (1/2 tablet) daily-monitor BP and HR at home.  Follow-Up: Your physician wants you to follow-up in: 6 MONTHS with Dr. Claiborne Billings or sooner if needed. You will receive a reminder letter in the mail two months in advance. If you don't receive a letter, please call our office to schedule the follow-up appointment.   Any Other Special Instructions Will Be Listed Below (If Applicable).     If you need a refill on your cardiac medications before your next appointment, please call your pharmacy.

## 2016-11-05 ENCOUNTER — Telehealth: Payer: Self-pay | Admitting: Cardiovascular Disease

## 2016-11-05 NOTE — Telephone Encounter (Signed)
Returned the call to the patient. She stated that she was started on Metoprolol 12.5 mg daily on 9/24. Since then her heart rate has been in the 40's and she has been feeling weak.  Yesterday 155/75 Heart rate 44 Last night  144/70 Heart rate 47 Today        136/61 Heart rate 48  Will route to the provider for his recommendation.

## 2016-11-05 NOTE — Telephone Encounter (Signed)
New message    Pt c/o medication issue:  1. Name of Medication: metoprolol   2. How are you currently taking this medication (dosage and times per day)? 25 mg half tab daily  3. Are you having a reaction (difficulty breathing--STAT)? No   4. What is your medication issue? Pt states that her HR is in the low 40's.

## 2016-11-05 NOTE — Telephone Encounter (Signed)
Will change Cardizem from 240 mg daily down to 120 mg.  Continue taking the Toprol, but hold if heart rate is less than 54.

## 2016-11-06 ENCOUNTER — Ambulatory Visit: Payer: Medicare Other

## 2016-11-06 MED ORDER — DILTIAZEM HCL ER COATED BEADS 120 MG PO CP24: 120.0000 mg | ORAL_CAPSULE | Freq: Every day | ORAL | 3 refills | Status: DC

## 2016-11-06 NOTE — Telephone Encounter (Signed)
Returned the call to the patient to inform her of Dr. Evette Georges recommendation. The diltiazem has been reduced to 120 mg and called in for her, per her request. She has been instructed to check her heart rate before taking the metoprolol and to hold if it is less than 54. She verbalized her understanding.

## 2016-11-09 ENCOUNTER — Ambulatory Visit
Admission: RE | Admit: 2016-11-09 | Discharge: 2016-11-09 | Disposition: A | Discharge status: Home / Self Care | Payer: Medicare Other | Source: Ambulatory Visit | Attending: Internal Medicine | Admitting: Internal Medicine

## 2016-11-09 DIAGNOSIS — I2510 Atherosclerotic heart disease of native coronary artery without angina pectoris: Secondary | ICD-10-CM | POA: Insufficient documentation

## 2016-11-09 DIAGNOSIS — R911 Solitary pulmonary nodule: Secondary | ICD-10-CM | POA: Insufficient documentation

## 2016-11-09 DIAGNOSIS — I700 Atherosclerosis of aorta: Secondary | ICD-10-CM | POA: Diagnosis not present

## 2016-11-12 ENCOUNTER — Telehealth: Payer: Self-pay | Admitting: Cardiovascular Disease

## 2016-11-12 ENCOUNTER — Telehealth: Payer: Self-pay | Admitting: Internal Medicine

## 2016-11-12 MED ORDER — ROSUVASTATIN CALCIUM 10 MG PO TABS
10.0000 mg | ORAL_TABLET | Freq: Every day | ORAL | 3 refills | Status: DC
Start: 2016-11-12 — End: 2017-05-05

## 2016-11-12 NOTE — Telephone Encounter (Signed)
Pt spouse called stating that the pt is very upset that she has all this blockage and she is going to have a stroke and that Dr. Derrel Nip is not going to do anything. Please advise, thank you!  Call pt @ [phone]

## 2016-11-12 NOTE — Telephone Encounter (Signed)
Spoke with pt's husband(on DPR) and reread the CT results exactly like Dr. Derrel Nip wrote them. The pt's husband was very abrasive when speaking with me. He was very unhappy with how his wife felt after she received her CT scan results. I called the pt with her results and told her exactly what Dr. Derrel Nip had wrote in the message and the pt stated that she would get in touch with her cardiologist because he is the one that handles this stuff. When the husband called back he stated that he was upset with Dr. Derrel Nip because she was not going to do anything about this and how Dr. Loura Back should have "taken 3-4 minutes out of her day to call her with the results and let her know what she needed to do about it". I explained to the husband that Dr. Derrel Nip suggested that the pt start taking a cholesterol medication called lipitor. The husband went on to say that "hardly anybody in this world takes those medications anymore" and that he didn't like any of the statin medications. I also explained to him that Dr. Derrel Nip sees patients throughout the day that's why the CMA makes the phone calls for her. The husband stated that they have a phone call into the pt's cardiologist to let him know about this and see what he wants her to do.

## 2016-11-12 NOTE — Telephone Encounter (Signed)
Dr. Claiborne Billings called and spoke to husband in regards to concerns.  Patient to start rosuvastatin 10mg  daily. Prescription sent to preferred pharmacy.   Husband is aware.

## 2016-11-12 NOTE — Telephone Encounter (Signed)
Spoke with patients husband and he is very upset about findings on CT scan done by PCP. He stated his wife has never been told anything but her cholesterol is fine and now this shows up. He wants to speak with Dr Claiborne Billings today after he reviews scan. If doctor Claiborne Billings can not call him he at least wants to speak with his nurse. Will forward to Dr Claiborne Billings for review

## 2016-11-12 NOTE — Telephone Encounter (Signed)
New Message   There were just in the office last week and you did not say anything about her condition being this bad, that she can possibly have a stroke.   Patient husband is very upset, pt had a CT done and is say that's all her arteries are block, and he wants to know why this was not detected before now. Then the doctor did not even call to tell them how severe the situation was, they sent a letter.   Please pull the CT from 11/09/16 and call them to discuss the results today.

## 2016-11-16 ENCOUNTER — Telehealth: Payer: Self-pay | Admitting: Cardiovascular Disease

## 2016-11-16 NOTE — Telephone Encounter (Signed)
New message    Pt is calling stating that the medication changes are not working for her. She wanted appt with Dr. Claiborne Billings, scheduled 11/18/16 at 3pm.

## 2016-11-16 NOTE — Telephone Encounter (Signed)
Returned call. Unable to reach patient at provided number. Scheduled by operator for MD OV.

## 2016-11-18 ENCOUNTER — Encounter: Payer: Self-pay | Admitting: Cardiovascular Disease

## 2016-11-18 ENCOUNTER — Ambulatory Visit (INDEPENDENT_AMBULATORY_CARE_PROVIDER_SITE_OTHER): Payer: Medicare Other | Admitting: Cardiovascular Disease

## 2016-11-18 VITALS — BP 156/80 | HR 65 | Ht 64.0 in | Wt 117.6 lb

## 2016-11-18 DIAGNOSIS — E7800 Pure hypercholesterolemia, unspecified: Secondary | ICD-10-CM | POA: Diagnosis not present

## 2016-11-18 DIAGNOSIS — I6529 Occlusion and stenosis of unspecified carotid artery: Secondary | ICD-10-CM

## 2016-11-18 DIAGNOSIS — Z79899 Other long term (current) drug therapy: Secondary | ICD-10-CM | POA: Diagnosis not present

## 2016-11-18 DIAGNOSIS — I10 Essential (primary) hypertension: Secondary | ICD-10-CM | POA: Diagnosis not present

## 2016-11-18 DIAGNOSIS — I2510 Atherosclerotic heart disease of native coronary artery without angina pectoris: Secondary | ICD-10-CM

## 2016-11-18 DIAGNOSIS — R002 Palpitations: Secondary | ICD-10-CM | POA: Diagnosis not present

## 2016-11-18 DIAGNOSIS — E785 Hyperlipidemia, unspecified: Secondary | ICD-10-CM

## 2016-11-18 MED ORDER — METOPROLOL TARTRATE 25 MG PO TABS: ORAL_TABLET | ORAL | 3 refills | Status: DC

## 2016-11-18 MED ORDER — DILTIAZEM HCL ER COATED BEADS 180 MG PO CP24: 180.0000 mg | ORAL_CAPSULE | Freq: Every day | ORAL | 3 refills | Status: DC

## 2016-11-18 NOTE — Progress Notes (Signed)
Patient ID: Chloe Perkins, female   DOB: February 29, 1945, 71 y.o.   MRN: [id number]    Primary M.D.: Dr. Deborra Medina  HPI: Chloe Perkins is a 71 y.o. female who presents to the office for a 3 week cardiology follow-up evaluation.  Chloe Perkins has a history of SVT and hypertension, for which she has been on diltiazem at 120 mg  and losartan 50 mg daily.  She has  also remote history of thyroid abnormality and in the past  had taken levothyroxine.  She has mild renal insufficiency with stage III renal disease.  Has a history of anxiety/depression for which she takes Wellbutrin XL 300 mg daily.  She also is on calcium with vitamin D in light of osteoporosis.  Recently, she believes that she has been experiencing more episodes of palpitations which are short-lived and at times occur daily.  She denies associated presyncope or syncope.  She denies chest pain.  She recently underwent a panoramix dental x-ray and was told by her dentist that she may have calcification of her carotids.   She has a history of osteoporosis, as well as vitamin B12 deficiency and has previously been found to have stable solitary pulmonary nodule.  She had a follow-up echo Doppler study in November 2016 which showed an ejection fraction at 60-65%.  She had normal diastolic parameters.  There was turbulent flow at the base of her aortic valve in the region of the right coronary cusp which was most likely the cause of her systolic murmur.    I last saw her, she was experiencing  transient palpitations lasting less than 5 seconds, almost on a daily basis.  She has been using caffeine.  She underwent a carotid duplex study in July 2017 which showed heterogeneous plaque bilaterally and 1-39% bilateral ICA stenoses with normal subclavian arteries, and patent vertebral arteries with antegrade flow.  I saw her, I further titrated her Cardizem and suggested that she take 240 mg at bedtime.  She's continued to take losartan 75 mg in the  morning.  Since I  saw her in February 2018, she states that she has continued to experience short-lived episodes of asymptomatic palpitations which may last anywhere from 5-10 seconds, but seemed to occur on a daily basis.  She has been on slow acting diltiazem at 240 mg daily and has continued to take losartan 75 mg daily for hypertension.  She also is on Ativan and Wellbutrin.   Since I saw her several weeks ago, she underwent a CT scan by Dr. Helene Kelp to low.  She was found to have a stable 1 cm nodule in the right lower lobe that was unchanged from previously.  Incidentally, she was also noted to have coronary artery calcification suggestive of CAD.  Once I found out about this, I had contacted her and recommended initiation of Crestor at an initial very low dose of 10 mg.  Since she was having almost daily palpitations when I saw her several weeks ago.  I attempted add very low-dose metoprolol succinate at 12.5 mg.  States that her heart rate had dropped into the upper 40s with this and she stopped taking it; however, toprol did improve her daily palpitations.  Because of concerns she is here now for reevaluation.  Past Medical History:  Diagnosis Date  . Anxiety and depression   . Asthmatic bronchitis   . Cancer ([place])    MELANOMA  . Hemorrhoids   . History of melanoma   .  HLD (hyperlipidemia)   . Hypertension   . Mitral valve prolapse   . Osteoporosis   . Personal history of malignant melanoma of skin   . SVT (supraventricular tachycardia) ([place])   . Viral gastroenteritis     Past Surgical History:  Procedure Laterality Date  . ABDOMINAL HYSTERECTOMY  1975  . COLONOSCOPY  04/15/2004   2006: Normal  . ENDOMETRIAL ABLATION  1976  . left thumb tendon repair  10/09  . MELANOMA EXCISION  1999   superficial spreading melanoma right post shoulder  . NM MYOCAR PERF WALL MOTION  12/28/2008   No ischemia  . SKIN CANCER EXCISION  03/02/2013   superficial basal cell cancer  . US  ECHOCARDIOGRAPHY  10/16/2010   mitral valve leaflets midly thickened,trace MR.  . WRIST SURGERY Left 2011    Allergies  Allergen Reactions  . Alendronate Sodium Other (See Comments)    REACTION: pt states INTOL to Fosamax  . Verapamil Other (See Comments)    REACTION: Intol w/ bradycardia    Current Outpatient Prescriptions  Medication Sig Dispense Refill  . buPROPion (WELLBUTRIN XL) 300 MG 24 hr tablet Take 300 mg by mouth daily.    . calcium-vitamin D (OSCAL WITH D) 500-200 MG-UNIT per tablet Take 1 tablet by mouth 2 (two) times daily.      Marland Kitchen diltiazem (CARDIZEM CD) 180 MG 24 hr capsule Take 1 capsule (180 mg total) by mouth daily. 90 capsule 3  . LORazepam (ATIVAN) 0.5 MG tablet Take 0.5 mg by mouth 2 (two) times daily.     Marland Kitchen losartan (COZAAR) 50 MG tablet Take 1.5 tablets (75 mg total) by mouth daily. 135 tablet 3  . metoprolol tartrate (LOPRESSOR) 25 MG tablet Take 1/2-1 tablet as needed for palpitations 30 tablet 3  . rosuvastatin (CRESTOR) 10 MG tablet Take 1 tablet (10 mg total) by mouth daily. 90 tablet 3   No current facility-administered medications for this visit.     Social History   Social History  . Marital status: Married    Spouse name: Chloe Perkins  . Number of children: 1  . Years of education: N/A   Occupational History  . Retired    Social History Main Topics  . Smoking status: Former Smoker    Packs/day: 0.50    Years: 20.00    Types: Cigarettes    Quit date: 02/09/1990  . Smokeless tobacco: Never Used  . Alcohol use No  . Drug use: No  . Sexual activity: Yes   Other Topics Concern  . Not on file   Social History Narrative   She lives her family, she has one child, retired, no smoke no drink    Family History  Problem Relation Age of Onset  . Emphysema Father        PGF, brother, sister  . COPD Father        PGF, brother, sister  . Heart disease Father   . Rheum arthritis Mother   . Stroke Mother   . Heart disease Mother         Father, brother, sister  . Lung cancer Paternal Grandfather   . Ovarian cancer Paternal Aunt   . Breast cancer Cousin   . Cancer Brother   . Heart disease Brother   . Heart disease Sister     ROS General: Negative; No fevers, chills, or night sweats;  HEENT: Negative; No changes in vision or hearing, sinus congestion, difficulty swallowing Pulmonary: Stable lung nodule Cardiovascular: Palpitations  GI: Negative; No nausea, vomiting, diarrhea, or abdominal pain GU: Negative; No dysuria, hematuria, or difficulty voiding Musculoskeletal: she complains of chronic back pain.  Hematologic/Oncology: Negative; no easy bruising, bleeding Endocrine: Negative; no heat/cold intolerance; no diabetes Neuro: Negative; no changes in balance, headaches Skin: Negative; No rashes or skin lesions Psychiatric: Positive for anxiety/depression on Wellbutrin Sleep: Negative; No snoring, daytime sleepiness, hypersomnolence, bruxism, restless legs, hypnogognic hallucinations, no cataplexy Other comprehensive 14 point system review is negative.   PE BP (!) 156/80   Pulse 65   Ht _0  (1.626 m)   Wt 117 lb 9.6 oz (53.3 kg)   BMI 20.19 kg/m   Repeat blood pressure was 140/80  Wt Readings from Last 3 Encounters:  11/18/16 117 lb 9.6 oz (53.3 kg)  11/02/16 116 lb (52.6 kg)  10/27/16 119 lb (54 kg)     Physical Exam BP (!) 156/80   Pulse 65   Ht _1  (1.626 m)   Wt 117 lb 9.6 oz (53.3 kg)   BMI 20.19 kg/m  General: Alert, oriented, no distress.  Skin: normal turgor, no rashes, warm and dry HEENT: Normocephalic, atraumatic. Pupils equal round and reactive to light; sclera anicteric; extraocular muscles intact; Nose without nasal septal hypertrophy Mouth/Parynx benign; Mallinpatti scale 2 Neck: No JVD, no carotid bruits; normal carotid upstroke Lungs: clear to ausculatation and percussion; no wheezing or rales Chest wall: without tenderness to palpitation Heart: PMI not displaced, regular  today without previous ectopy which was heard at her last office visit, s1 s2 normal, 1/6 systolic murmur, no diastolic murmur, no rubs, gallops, thrills, or heaves Abdomen: soft, nontender; no hepatosplenomehaly, BS+; abdominal aorta nontender and not dilated by palpation. Back: no CVA tenderness Pulses 2+ Musculoskeletal: full range of motion, normal strength, no joint deformities Extremities: no clubbing cyanosis or edema, Homan's sign negative  Neurologic: grossly nonfocal; Cranial nerves grossly wnl Psychologic: Normal mood and affect    ECG (independently read by me): Sinus rhythm at 65 bpm, PAC.  PR interval 128 ms, QTc interval 428 ms.  September 2018 ECG (independently read by me): Normal sinus rhythm at 64 bpm, PA-C, mild RV conduction delay.  Nonspecific ST changes.  February 2017 ECG (independently read by me): Sinus bradycardia 53 bpm..  Mild RV conduction delay.  Normal intervals.  November 2017 ECG (independently read by me): Normal sinus rhythm at 61 bpm.  No ectopy.  Normal intervals.  July 2017 ECG (independently read by me): Sinus rhythm at 60 bpm with PAC.  Normal intervals.  November 2016 ECG (independently read by me): Normal sinus rhythm at 66 bpm.  Mild RV conduction delay.  Nondiagnostic small inferior Q waves.  ECG (independently read by me): sinus bradycardia 50 bpm.  Mild RV conduction delay  November 2015 ECG (independently read by me0;  Normal sinus rhythm at 77 bpm.  RV conduction delay.  Nondiagnostic small Q waves.  Nonspecific ST changes.  April 2015 ECG (independently read by me): Sinus bradycardia 54 beats per minute.  Mild RV conduction delay.  No ectopy.  LABS: BMP Latest Ref Rng & Units 03/25/2016 03/18/2015 12/11/2014  Glucose 65 - 99 mg/dL 95 98 90  BUN 6 - 20 mg/dL _2 Creatinine 0.44 - 1.00 mg/dL 1.07(H) 1.02 1.03(H)  Sodium 135 - 145 mmol/L 139 139 139  Potassium 3.5 - 5.1 mmol/L 4.2 4.8 4.9  Chloride 101 - 111 mmol/L 106 103 105  CO2  22 - 32 mmol/L _3 Calcium  8.9 - 10.3 mg/dL 9.5 10.4 9.2   Hepatic Function Latest Ref Rng & Units 03/25/2016 12/11/2014 11/28/2014  Total Protein 6.5 - 8.1 g/dL 7.2 6.4 -  Albumin 3.5 - 5.0 g/dL 4.4 3.9 -  AST 15 - 41 U/L _0 ALT 14 - 54 U/L _1 Alk Phosphatase 38 - 126 U/L 73 80 91  Total Bilirubin 0.3 - 1.2 mg/dL 0.6 0.5 -  Bilirubin, Direct 0.0 - 0.3 mg/dL - - -   CBC Latest Ref Rng & Units 03/25/2016 12/11/2014 11/28/2014  WBC 3.6 - 11.0 K/uL 3.4(L) 5.0 3.3  Hemoglobin 12.0 - 16.0 g/dL 13.8 14.3 14.3  Hematocrit 35.0 - 47.0 % 40.5 42.5 41  Platelets 150 - 440 K/uL 170 229 202   Lab Results  Component Value Date   MCV 95.1 03/25/2016   MCV 95.5 12/11/2014   MCV 94.7 05/04/2013   Lab Results  Component Value Date   TSH 5.358 (H) 03/25/2016  No results found for: HGBA1C  Lipid Panel     Component Value Date/Time   CHOL 215 (H) 03/25/2016 0722   CHOL 203 (H) 05/22/2011 0759   TRIG 71 03/25/2016 0722   TRIG 65 05/22/2011 0759   HDL 87 03/25/2016 0722   HDL 88 (H) 05/22/2011 0759   CHOLHDL 2.5 03/25/2016 0722   VLDL 14 03/25/2016 0722   VLDL 13 05/22/2011 0759   LDLCALC 114 (H) 03/25/2016 0722   LDLCALC 102 (H) 05/22/2011 0759   LDLDIRECT 102.2 04/23/2006 1211     RADIOLOGY: No results found.  IMPRESSION:  1. Pure hypercholesterolemia   2. Medication management   3. Essential hypertension   4. Palpitations   5. Hyperlipidemia with target LDL less than 100   6. Coronary artery calcification seen on CT scan     ASSESSMENT AND PLAN: Chloe Perkins is a 71 year old female who has a history of SVT as well as hypertension.In the past, she was experiencing more frequent palpitations which led to further dose escalation of Cardizem up to 240 mg with improvement.  When I last saw her, her blood pressure was stable on losartan in addition to diltiazem but because of frequent palpitations I attempted to add low-dose metoprolol succinate at 12.5 mg  daily and reduced diltiazem to 120 mg  daily.  Apparently she did not tolerate is due to bradycardia, although she denied any definitive presyncope or syncope.  I am recommending she change her Cardizem to 180 mg and I am prescribing  short acting metoprolol tartrate to take as needed if she experiences palpitations.  I also reviewed her chest CT with her in detail.  I had a lengthy discussion with her regarding the fact that she now has demonstration of subclinical atherosclerosis and the importance if we want to induce plaque regression, we will need to initiate statin therapy in attempt to reduce LDL below 70 and preferably to in the 50s or below.  After much discussion, she has agreed to undergo this initiation of treatment.  I answered all her questions.  Repeat laboratory will be in 3 months.  I will see her in 6 months for reevaluation.    Time spent: 25 minutes  Troy Sine, MD, [employer]  11/20/2016 6:11 PM

## 2016-11-18 NOTE — Patient Instructions (Signed)
Medication Instructions:  STOP metoprolol succinate (Toprol XL)  INCREASE diltiazem (Cardizem CD) to 180mg  daily  TAKE metoprolol tartrate 0.5-1 tablet (12.5-25mg ) as needed for palpitations  Labwork: Please return for FASTING labs in 3 MONTHS (CMET, Lipid)  Our in office lab hours are Monday-Friday 8:00-4:30, closed for lunch 1-2 pm.  No appointment needed.  Follow-Up: Your physician wants you to follow-up in: 6 MONTHS with Dr. Claiborne Billings (as planned) You will receive a reminder letter in the mail two months in advance. If you don't receive a letter, please call our office to schedule the follow-up appointment.   Any Other Special Instructions Will Be Listed Below (If Applicable).     If you need a refill on your cardiac medications before your next appointment, please call your pharmacy.

## 2016-12-02 ENCOUNTER — Ambulatory Visit (INDEPENDENT_AMBULATORY_CARE_PROVIDER_SITE_OTHER): Payer: Medicare Other | Admitting: *Deleted

## 2016-12-02 DIAGNOSIS — E538 Deficiency of other specified B group vitamins: Secondary | ICD-10-CM

## 2016-12-02 MED ORDER — CYANOCOBALAMIN 1000 MCG/ML IJ SOLN
1000.0000 ug | Freq: Once | INTRAMUSCULAR | Status: AC
  Administered 2016-12-02: 1000 ug via INTRAMUSCULAR

## 2016-12-02 NOTE — Progress Notes (Signed)
Patient presented for B 12 injection to left deltoid, patient voiced no concerns nor showed any signs of distress during injection. 

## 2016-12-17 ENCOUNTER — Ambulatory Visit: Payer: Self-pay | Admitting: *Deleted

## 2016-12-17 NOTE — Telephone Encounter (Signed)
Patients husband called and stated that his wife was having right sided flank pain. Patient has had UTIs in the past with just flank pain. Advised walk-in clinic since you are not in the office. Husband says that he was going to ask his wife if she wanted to go there and call back if she would rather be seen by [employer].

## 2016-12-17 NOTE — Telephone Encounter (Signed)
Pt has flank pain twice now in the last 6 weeks.  Advised to see her doctor in the next 24 hours.  Reason for Disposition . MODERATE pain (e.g., interferes with normal activities or awakens from sleep)  Answer Assessment - Initial Assessment Questions 1. LOCATION: "Where does it hurt?" (e.g., left, right)     Right flank 2. ONSET: "When did the pain start?"    3 days ago 3. SEVERITY: "How bad is the pain?" (e.g., Scale 1-10; mild, moderate, or severe)   - MILD (1-3): doesn't interfere with normal activities    - MODERATE (4-7): interferes with normal activities or awakens from sleep    - SEVERE (8-10): excruciating pain and patient unable to do normal activities (stays in bed)       7 4. PATTERN: "Does the pain come and go, or is it constant?"      Come and go 5. CAUSE: "What do you think is causing the pain?"     Urinary tract infection 6. OTHER SYMPTOMS:  "Do you have any other symptoms?" (e.g., fever, abdominal pain, vomiting, leg weakness, burning with urination, blood in urine)     Fever temp 99 yesterday, having chills 7. PREGNANCY:  "Is there any chance you are pregnant?" "When was your last menstrual period?"     no  Protocols used: FLANK PAIN-A-AH

## 2016-12-17 NOTE — Telephone Encounter (Signed)
Ok.  My Schedule is too full to see her tomorrow.

## 2016-12-18 ENCOUNTER — Ambulatory Visit (INDEPENDENT_AMBULATORY_CARE_PROVIDER_SITE_OTHER): Payer: Medicare Other | Admitting: Family Medicine

## 2016-12-18 ENCOUNTER — Other Ambulatory Visit: Payer: Self-pay

## 2016-12-18 ENCOUNTER — Encounter: Payer: Self-pay | Admitting: Family Medicine

## 2016-12-18 VITALS — BP 150/86 | HR 65 | Temp 98.2°F | Ht 64.0 in | Wt 116.2 lb

## 2016-12-18 DIAGNOSIS — M546 Pain in thoracic spine: Secondary | ICD-10-CM

## 2016-12-18 DIAGNOSIS — I6529 Occlusion and stenosis of unspecified carotid artery: Secondary | ICD-10-CM | POA: Diagnosis not present

## 2016-12-18 DIAGNOSIS — R109 Unspecified abdominal pain: Secondary | ICD-10-CM | POA: Diagnosis not present

## 2016-12-18 LAB — POC URINALSYSI DIPSTICK (AUTOMATED)
BILIRUBIN UA: NEGATIVE
GLUCOSE UA: NEGATIVE
Ketones, UA: NEGATIVE
Leukocytes, UA: NEGATIVE
Nitrite, UA: NEGATIVE
Protein, UA: NEGATIVE
RBC UA: NEGATIVE
SPEC GRAV UA: 1.02 (ref 1.010–1.025)
Urobilinogen, UA: 0.2 E.U./dL
pH, UA: 6 (ref 5.0–8.0)

## 2016-12-18 MED ORDER — MELOXICAM 15 MG PO TABS: 15.0000 mg | ORAL_TABLET | Freq: Every day | ORAL | 0 refills | Status: DC

## 2016-12-18 NOTE — Progress Notes (Signed)
   Subjective:    Patient ID: Chloe Perkins, female    DOB: 1946/02/09, 71 y.o.   MRN: [id number]  HPI    71 year old female pt of Dr. Lupita Dawn with history of IBS, HTN, anxiety depression, CKD and  SVT presents with new onset right flank, CVA pain in last 3 days.  Pain is presents constantly. 7/10 on pain scale. No radiation of pain   Nothing helps make it better.. She has been using motrin, tried muscle relaxant... Did not help. Hurts more with twisting or moving arms.   She reports no dysuria, no increase frequency, no fever ( had chills and low grade temp 3 days ago)  no diarrhea, mild constipation. Nml BP today.  no rash noted.   UA in office clear.   At home BPs 134/77. Blood pressure (!) 150/86, pulse 65, temperature 98.2 F (36.8 C), temperature source Oral, height 5\' 4"  (1.626 m), weight 116 lb 4 oz (52.7 kg). BP Readings from Last 3 Encounters:  12/18/16 (!) 150/86  11/18/16 (!) 156/80  11/02/16 100/66   Has gallbladder.   Review of Systems  Constitutional: Negative for fatigue and fever.  HENT: Negative for ear pain.   Eyes: Negative for pain.  Respiratory: Negative for chest tightness and shortness of breath.   Cardiovascular: Negative for chest pain, palpitations and leg swelling.  Gastrointestinal: Negative for abdominal pain.  Genitourinary: Negative for dysuria.       Objective:   Physical Exam  Constitutional: Vital signs are normal. She appears well-developed and well-nourished. She is cooperative.  Non-toxic appearance. She does not appear ill. No distress.  HENT:  Head: Normocephalic.  Right Ear: Hearing, tympanic membrane, external ear and ear canal normal. Tympanic membrane is not erythematous, not retracted and not bulging.  Left Ear: Hearing, tympanic membrane, external ear and ear canal normal. Tympanic membrane is not erythematous, not retracted and not bulging.  Nose: No mucosal edema or rhinorrhea. Right sinus exhibits no maxillary sinus  tenderness and no frontal sinus tenderness. Left sinus exhibits no maxillary sinus tenderness and no frontal sinus tenderness.  Mouth/Throat: Uvula is midline, oropharynx is clear and moist and mucous membranes are normal.  Eyes: Conjunctivae, EOM and lids are normal. Pupils are equal, round, and reactive to light. Lids are everted and swept, no foreign bodies found.  Neck: Trachea normal and normal range of motion. Neck supple. Carotid bruit is not present. No thyroid mass and no thyromegaly present.  Cardiovascular: Normal rate, regular rhythm, S1 normal, S2 normal, normal heart sounds, intact distal pulses and normal pulses. Exam reveals no gallop and no friction rub.  No murmur heard. Pulmonary/Chest: Effort normal and breath sounds normal. No tachypnea. No respiratory distress. She has no decreased breath sounds. She has no wheezes. She has no rhonchi. She has no rales.  Abdominal: Soft. Normal appearance and bowel sounds are normal. There is no tenderness.  Musculoskeletal:       Thoracic back: Normal.  ttp over right paraspinous muscle complex.  Neurological: She is alert.  Skin: Skin is warm, dry and intact. No rash noted.  No skin changes  Psychiatric: Her speech is normal and behavior is normal. Judgment and thought content normal. Her mood appears not anxious. Cognition and memory are normal. She does not exhibit a depressed mood.          Assessment & Plan:

## 2016-12-18 NOTE — Assessment & Plan Note (Signed)
Neg UA, no other clear source of pain. Consistent with muscle strain.  Treat with heat ,  NSAID and start home stretching.

## 2016-12-18 NOTE — Patient Instructions (Signed)
Start mobic daily for pain and inflammation.  Can use baclofen at bedtime.  Hold OTC antiinflammatory.  Apply heat and start home stretching.

## 2017-01-05 ENCOUNTER — Ambulatory Visit: Payer: Medicare Other

## 2017-01-28 ENCOUNTER — Ambulatory Visit (INDEPENDENT_AMBULATORY_CARE_PROVIDER_SITE_OTHER): Payer: Medicare Other | Admitting: *Deleted

## 2017-01-28 DIAGNOSIS — E538 Deficiency of other specified B group vitamins: Secondary | ICD-10-CM

## 2017-01-28 MED ORDER — CYANOCOBALAMIN 1000 MCG/ML IJ SOLN
1000.0000 ug | Freq: Once | INTRAMUSCULAR | Status: AC
  Administered 2017-01-28: 1000 ug via INTRAMUSCULAR

## 2017-01-28 NOTE — Progress Notes (Addendum)
Patient presented for B 12 injection to left deltoid, patient voiced no concerns nor showed any signs of distress during injection.  Reviewed.  Dr Scott 

## 2017-02-01 ENCOUNTER — Encounter: Payer: Self-pay | Admitting: *Deleted

## 2017-02-10 ENCOUNTER — Encounter: Payer: Self-pay | Admitting: *Deleted

## 2017-02-10 ENCOUNTER — Ambulatory Visit
Admission: RE | Admit: 2017-02-10 | Discharge: 2017-02-10 | Disposition: A | Discharge status: Home / Self Care | Payer: Medicare Other | Source: Ambulatory Visit | Attending: Ophthalmology | Admitting: Ophthalmology

## 2017-02-10 ENCOUNTER — Encounter
Admission: RE | Disposition: A | Discharge status: Home / Self Care | Payer: Self-pay | Source: Ambulatory Visit | Attending: Ophthalmology

## 2017-02-10 ENCOUNTER — Ambulatory Visit: Payer: Medicare Other | Admitting: Anesthesiology

## 2017-02-10 DIAGNOSIS — H2511 Age-related nuclear cataract, right eye: Secondary | ICD-10-CM | POA: Diagnosis not present

## 2017-02-10 DIAGNOSIS — F419 Anxiety disorder, unspecified: Secondary | ICD-10-CM | POA: Insufficient documentation

## 2017-02-10 DIAGNOSIS — I10 Essential (primary) hypertension: Secondary | ICD-10-CM | POA: Insufficient documentation

## 2017-02-10 DIAGNOSIS — E039 Hypothyroidism, unspecified: Secondary | ICD-10-CM | POA: Insufficient documentation

## 2017-02-10 DIAGNOSIS — J45909 Unspecified asthma, uncomplicated: Secondary | ICD-10-CM | POA: Diagnosis not present

## 2017-02-10 DIAGNOSIS — Z87891 Personal history of nicotine dependence: Secondary | ICD-10-CM | POA: Insufficient documentation

## 2017-02-10 DIAGNOSIS — M1990 Unspecified osteoarthritis, unspecified site: Secondary | ICD-10-CM | POA: Diagnosis not present

## 2017-02-10 DIAGNOSIS — Z79899 Other long term (current) drug therapy: Secondary | ICD-10-CM | POA: Diagnosis not present

## 2017-02-10 HISTORY — DX: Unspecified osteoarthritis, unspecified site: M19.90

## 2017-02-10 HISTORY — DX: Cardiac murmur, unspecified: R01.1

## 2017-02-10 HISTORY — PX: CATARACT EXTRACTION W/PHACO: SHX586

## 2017-02-10 HISTORY — DX: Cardiac arrhythmia, unspecified: I49.9

## 2017-02-10 SURGERY — PHACOEMULSIFICATION, CATARACT, WITH IOL INSERTION
Anesthesia: Monitor Anesthesia Care | Site: Eye | Laterality: Right | Wound class: Clean

## 2017-02-10 MED ORDER — ARMC OPHTHALMIC DILATING DROPS
1.0000 "application " | OPHTHALMIC | Status: AC
  Administered 2017-02-10 (×3): 1 via OPHTHALMIC

## 2017-02-10 MED ORDER — MIDAZOLAM HCL 2 MG/2ML IJ SOLN
INTRAMUSCULAR | Status: AC
  Filled 2017-02-10: qty 2

## 2017-02-10 MED ORDER — SODIUM CHLORIDE 0.9 % IV SOLN
INTRAVENOUS | Status: DC
  Administered 2017-02-10 (×2): via INTRAVENOUS

## 2017-02-10 MED ORDER — CARBACHOL 0.01 % IO SOLN
INTRAOCULAR | Status: DC | PRN
  Administered 2017-02-10: 0.5 mL via INTRAOCULAR

## 2017-02-10 MED ORDER — EPINEPHRINE PF 1 MG/ML IJ SOLN
INTRAOCULAR | Status: DC | PRN
  Administered 2017-02-10: 09:00:00 via OPHTHALMIC

## 2017-02-10 MED ORDER — NA CHONDROIT SULF-NA HYALURON 40-17 MG/ML IO SOLN
INTRAOCULAR | Status: DC | PRN
  Administered 2017-02-10: 1 mL via INTRAOCULAR

## 2017-02-10 MED ORDER — MOXIFLOXACIN HCL 0.5 % OP SOLN: 1.0000 [drp] | OPHTHALMIC | Status: DC | PRN

## 2017-02-10 MED ORDER — MOXIFLOXACIN HCL 0.5 % OP SOLN
OPHTHALMIC | Status: DC | PRN
  Administered 2017-02-10: 0.2 mL via OPHTHALMIC

## 2017-02-10 MED ORDER — LIDOCAINE HCL (PF) 4 % IJ SOLN
INTRAOCULAR | Status: DC | PRN
  Administered 2017-02-10: 4 mL via OPHTHALMIC

## 2017-02-10 MED ORDER — MOXIFLOXACIN HCL 0.5 % OP SOLN
OPHTHALMIC | Status: AC
  Filled 2017-02-10: qty 3

## 2017-02-10 MED ORDER — ARMC OPHTHALMIC DILATING DROPS
OPHTHALMIC | Status: AC
  Administered 2017-02-10: 1 via OPHTHALMIC
  Filled 2017-02-10: qty 0.4

## 2017-02-10 MED ORDER — NA CHONDROIT SULF-NA HYALURON 40-17 MG/ML IO SOLN
INTRAOCULAR | Status: AC
  Filled 2017-02-10: qty 1

## 2017-02-10 MED ORDER — CEFUROXIME OPHTHALMIC INJECTION 1 MG/0.1 ML
INJECTION | OPHTHALMIC | Status: DC | PRN
  Administered 2017-02-10: 1 mg via INTRACAMERAL

## 2017-02-10 MED ORDER — LIDOCAINE HCL (PF) 4 % IJ SOLN
INTRAMUSCULAR | Status: AC
  Filled 2017-02-10: qty 5

## 2017-02-10 MED ORDER — EPINEPHRINE PF 1 MG/ML IJ SOLN
INTRAMUSCULAR | Status: AC
  Filled 2017-02-10: qty 2

## 2017-02-10 MED ORDER — POVIDONE-IODINE 5 % OP SOLN
OPHTHALMIC | Status: DC | PRN
  Administered 2017-02-10: 1 via OPHTHALMIC

## 2017-02-10 MED ORDER — MIDAZOLAM HCL 2 MG/2ML IJ SOLN
INTRAMUSCULAR | Status: DC | PRN
  Administered 2017-02-10 (×2): 1 mg via INTRAVENOUS

## 2017-02-10 MED ORDER — POVIDONE-IODINE 5 % OP SOLN
OPHTHALMIC | Status: AC
  Filled 2017-02-10: qty 30

## 2017-02-10 SURGICAL SUPPLY — 16 items
GLOVE BIO SURGEON STRL SZ8 (GLOVE) ×3 IMPLANT
GLOVE BIOGEL M 6.5 STRL (GLOVE) ×3 IMPLANT
GLOVE SURG LX 8.0 MICRO (GLOVE) ×2
GLOVE SURG LX STRL 8.0 MICRO (GLOVE) ×1 IMPLANT
GOWN STRL REUS W/ TWL LRG LVL3 (GOWN DISPOSABLE) ×2 IMPLANT
GOWN STRL REUS W/TWL LRG LVL3 (GOWN DISPOSABLE) ×6
LABEL CATARACT MEDS ST (LABEL) ×3 IMPLANT
LENS IOL TECNIS ITEC 21.0 (Intraocular Lens) ×2 IMPLANT
PACK CATARACT (MISCELLANEOUS) ×3 IMPLANT
PACK CATARACT BRASINGTON LX (MISCELLANEOUS) ×3 IMPLANT
PACK EYE AFTER SURG (MISCELLANEOUS) ×3 IMPLANT
SOL BSS BAG (MISCELLANEOUS) ×3
SOLUTION BSS BAG (MISCELLANEOUS) ×1 IMPLANT
SYR 5ML LL (SYRINGE) ×3 IMPLANT
WATER STERILE IRR 250ML POUR (IV SOLUTION) ×3 IMPLANT
WIPE NON LINTING 3.25X3.25 (MISCELLANEOUS) ×3 IMPLANT

## 2017-02-10 NOTE — H&P (Signed)
All labs reviewed. Abnormal studies sent to patients PCP when indicated.  Previous H&P reviewed, patient examined, there are NO CHANGES.  Chloe Badolato LOUIS1/2/20198:29 AM

## 2017-02-10 NOTE — Discharge Instructions (Signed)
Eye Surgery Discharge Instructions  Expect mild scratchy sensation or mild soreness. DO NOT RUB YOUR EYE!  The day of surgery:  Minimal physical activity, but bed rest is not required  No reading, computer work, or close hand work  No bending, lifting, or straining.  May watch TV  For 24 hours:  No driving, legal decisions, or alcoholic beverages  Safety precautions  Eat anything you prefer: It is better to start with liquids, then soup then solid foods.  _____ Eye patch should be worn at bedtime for the first week.  __x__ Solar shield eyeglasses should be worn for comfort in the sunlight/patch while sleeping  Resume all regular medications including aspirin or Coumadin if these were discontinued prior to surgery. You may shower, bathe, shave, or wash your hair. Tylenol may be taken for mild discomfort.  This afternoon, tylenol and a nap may help relieve any post op headache/discomfort.    Call your doctor if you experience significant pain, nausea, or vomiting, fever > 101 or other signs of infection. [phone] or [phone] Specific instructions:  Follow-up Information    Birder Robson, MD Follow up on 02/11/2017.   Specialty:  Ophthalmology Why:  follow up appointment at 9:20 am in the office Contact information: [place] [address] [phone]

## 2017-02-10 NOTE — Anesthesia Preprocedure Evaluation (Signed)
Anesthesia Evaluation  Patient identified by MRN, date of birth, ID band Patient awake    Reviewed: Allergy & Precautions, NPO status , Patient's Chart, lab work & pertinent test results, reviewed documented beta blocker date and time   Airway Mallampati: II  TM Distance: >3 FB     Dental  (+) Chipped   Pulmonary asthma , former smoker,           Cardiovascular hypertension, Pt. on medications and Pt. on home beta blockers + dysrhythmias + Valvular Problems/Murmurs      Neuro/Psych Anxiety    GI/Hepatic   Endo/Other  Hypothyroidism   Renal/GU Renal disease     Musculoskeletal  (+) Arthritis ,   Abdominal   Peds  Hematology   Anesthesia Other Findings   Reproductive/Obstetrics                             Anesthesia Physical Anesthesia Plan  ASA: III  Anesthesia Plan: MAC   Post-op Pain Management:    Induction:   PONV Risk Score and Plan:   Airway Management Planned:   Additional Equipment:   Intra-op Plan:   Post-operative Plan:   Informed Consent: I have reviewed the patients History and Physical, chart, labs and discussed the procedure including the risks, benefits and alternatives for the proposed anesthesia with the patient or authorized representative who has indicated his/her understanding and acceptance.     Plan Discussed with: CRNA  Anesthesia Plan Comments:         Anesthesia Quick Evaluation

## 2017-02-10 NOTE — Transfer of Care (Signed)
Immediate Anesthesia Transfer of Care Note  Patient: Chloe Perkins  Procedure(s) Performed: CATARACT EXTRACTION PHACO AND INTRAOCULAR LENS PLACEMENT (IOC) (Right Eye)  Patient Location: PACU  Anesthesia Type:MAC  Level of Consciousness: awake, alert  and oriented  Airway & Oxygen Therapy: Patient Spontanous Breathing  Post-op Assessment: Report given to RN and Post -op Vital signs reviewed and stable  Post vital signs: Reviewed and stable  Last Vitals:  Vitals:   02/10/17 0855 02/10/17 0856  BP: 139/69 139/69  Pulse: (!) 54 (!) 54  Resp: 16 12  Temp: 36.5 C 36.5 C  SpO2: 100% 99%    Last Pain:  Vitals:   02/10/17 0856  TempSrc: Tympanic  PainSc:       Patients Stated Pain Goal: 0 (93/81/01 7510)  Complications: No apparent anesthesia complications

## 2017-02-10 NOTE — Anesthesia Post-op Follow-up Note (Signed)
Anesthesia QCDR form completed.        

## 2017-02-10 NOTE — Anesthesia Postprocedure Evaluation (Signed)
Anesthesia Post Note  Patient: Chloe Perkins  Procedure(s) Performed: CATARACT EXTRACTION PHACO AND INTRAOCULAR LENS PLACEMENT (IOC) (Right Eye)  Patient location during evaluation: PACU Anesthesia Type: MAC Level of consciousness: awake and alert Pain management: pain level controlled Vital Signs Assessment: post-procedure vital signs reviewed and stable Respiratory status: spontaneous breathing, nonlabored ventilation, respiratory function stable and patient connected to nasal cannula oxygen Cardiovascular status: stable and blood pressure returned to baseline Postop Assessment: no apparent nausea or vomiting Anesthetic complications: no     Last Vitals:  Vitals:   02/10/17 0856 02/10/17 0929  BP: 139/69 122/72  Pulse: (!) 54 (!) 55  Resp: 12 14  Temp: 36.5 C   SpO2: 99% 100%    Last Pain:  Vitals:   02/10/17 0929  TempSrc:   PainSc: [place]

## 2017-02-10 NOTE — Op Note (Signed)
PREOPERATIVE DIAGNOSIS:  Nuclear sclerotic cataract of the right eye.   POSTOPERATIVE DIAGNOSIS:  NUCLEAR SCLEROTIC CATARACT RIGHT EYE   OPERATIVE PROCEDURE: Procedure(s): CATARACT EXTRACTION PHACO AND INTRAOCULAR LENS PLACEMENT ([facility])   SURGEON:  Birder Robson, MD.   ANESTHESIA:  Anesthesiologist: Gunnar Bulla, MD CRNA: Leander Rams, CRNA; Johnna Acosta, CRNA  1.      Managed anesthesia care. 2.      0.2ml of Shugarcaine was instilled in the eye following the paracentesis.   COMPLICATIONS:  None.   TECHNIQUE:   Stop and chop   DESCRIPTION OF PROCEDURE:  The patient was examined and consented in the preoperative holding area where the aforementioned topical anesthesia was applied to the right eye and then brought back to the Operating Room where the right eye was prepped and draped in the usual sterile ophthalmic fashion and a lid speculum was placed. A paracentesis was created with the side port blade and the anterior chamber was filled with viscoelastic. A near clear corneal incision was performed with the steel keratome. A continuous curvilinear capsulorrhexis was performed with a cystotome followed by the capsulorrhexis forceps. Hydrodissection and hydrodelineation were carried out with BSS on a blunt cannula. The lens was removed in a stop and chop  technique and the remaining cortical material was removed with the irrigation-aspiration handpiece. The capsular bag was inflated with viscoelastic and the Technis ZCB00  lens was placed in the capsular bag without complication. The remaining viscoelastic was removed from the eye with the irrigation-aspiration handpiece. The wounds were hydrated. The anterior chamber was flushed with Miostat and the eye was inflated to physiologic pressure. 0.69ml of Vigamox was placed in the anterior chamber. The wounds were found to be water tight. The eye was dressed with Vigamox. The patient was given protective glasses to wear throughout the day  and a shield with which to sleep tonight. The patient was also given drops with which to begin a drop regimen today and will follow-up with me in one day. Implant Name Type Inv. Item Serial No. Manufacturer Lot No. LRB No. Used  LENS IOL DIOP 21.0 - [id number] Intraocular Lens LENS IOL DIOP 21.0 [id number]  Right 1   Procedure(s) with comments: CATARACT EXTRACTION PHACO AND INTRAOCULAR LENS PLACEMENT ([facility]) (Right) - [place] 00:35.9 AP% 11.5 CDE 4.11 Fluid Pack Lot # [id number]  Electronically signed: [place] 02/10/2017 8:53 AM

## 2017-02-22 ENCOUNTER — Telehealth: Payer: Self-pay | Admitting: Cardiovascular Disease

## 2017-02-22 DIAGNOSIS — E7800 Pure hypercholesterolemia, unspecified: Secondary | ICD-10-CM

## 2017-02-22 DIAGNOSIS — I10 Essential (primary) hypertension: Secondary | ICD-10-CM

## 2017-02-22 NOTE — Telephone Encounter (Signed)
Mailed lab orders to patient, left detailed message ok per [facility]

## 2017-02-22 NOTE — Telephone Encounter (Signed)
New message    Patient requesting lab order be mailed to her. Request to get labs closer to home.

## 2017-03-03 ENCOUNTER — Ambulatory Visit: Payer: Medicare Other

## 2017-03-09 ENCOUNTER — Ambulatory Visit
Admission: RE | Admit: 2017-03-09 | Discharge: 2017-03-09 | Disposition: A | Discharge status: Home / Self Care | Payer: Medicare Other | Source: Ambulatory Visit | Attending: Ophthalmology | Admitting: Ophthalmology

## 2017-03-09 ENCOUNTER — Ambulatory Visit: Payer: Medicare Other | Admitting: Anesthesiology

## 2017-03-09 ENCOUNTER — Encounter
Admission: RE | Disposition: A | Discharge status: Home / Self Care | Payer: Self-pay | Source: Ambulatory Visit | Attending: Ophthalmology

## 2017-03-09 DIAGNOSIS — J45909 Unspecified asthma, uncomplicated: Secondary | ICD-10-CM | POA: Diagnosis not present

## 2017-03-09 DIAGNOSIS — Z885 Allergy status to narcotic agent status: Secondary | ICD-10-CM | POA: Insufficient documentation

## 2017-03-09 DIAGNOSIS — M810 Age-related osteoporosis without current pathological fracture: Secondary | ICD-10-CM | POA: Insufficient documentation

## 2017-03-09 DIAGNOSIS — R011 Cardiac murmur, unspecified: Secondary | ICD-10-CM | POA: Diagnosis not present

## 2017-03-09 DIAGNOSIS — Z85820 Personal history of malignant melanoma of skin: Secondary | ICD-10-CM | POA: Diagnosis not present

## 2017-03-09 DIAGNOSIS — I10 Essential (primary) hypertension: Secondary | ICD-10-CM | POA: Insufficient documentation

## 2017-03-09 DIAGNOSIS — Z87891 Personal history of nicotine dependence: Secondary | ICD-10-CM | POA: Diagnosis not present

## 2017-03-09 DIAGNOSIS — F419 Anxiety disorder, unspecified: Secondary | ICD-10-CM | POA: Insufficient documentation

## 2017-03-09 DIAGNOSIS — F329 Major depressive disorder, single episode, unspecified: Secondary | ICD-10-CM | POA: Diagnosis not present

## 2017-03-09 DIAGNOSIS — E785 Hyperlipidemia, unspecified: Secondary | ICD-10-CM | POA: Insufficient documentation

## 2017-03-09 DIAGNOSIS — M1990 Unspecified osteoarthritis, unspecified site: Secondary | ICD-10-CM | POA: Insufficient documentation

## 2017-03-09 DIAGNOSIS — E039 Hypothyroidism, unspecified: Secondary | ICD-10-CM | POA: Diagnosis not present

## 2017-03-09 DIAGNOSIS — H2512 Age-related nuclear cataract, left eye: Secondary | ICD-10-CM | POA: Insufficient documentation

## 2017-03-09 DIAGNOSIS — I471 Supraventricular tachycardia: Secondary | ICD-10-CM | POA: Diagnosis not present

## 2017-03-09 DIAGNOSIS — Z888 Allergy status to other drugs, medicaments and biological substances status: Secondary | ICD-10-CM | POA: Insufficient documentation

## 2017-03-09 DIAGNOSIS — I341 Nonrheumatic mitral (valve) prolapse: Secondary | ICD-10-CM | POA: Insufficient documentation

## 2017-03-09 DIAGNOSIS — Z79899 Other long term (current) drug therapy: Secondary | ICD-10-CM | POA: Insufficient documentation

## 2017-03-09 HISTORY — DX: Depression, unspecified: F32.A

## 2017-03-09 HISTORY — DX: Bronchitis, not specified as acute or chronic: J40

## 2017-03-09 HISTORY — PX: CATARACT EXTRACTION W/PHACO: SHX586

## 2017-03-09 HISTORY — DX: Major depressive disorder, single episode, unspecified: F32.9

## 2017-03-09 SURGERY — PHACOEMULSIFICATION, CATARACT, WITH IOL INSERTION
Anesthesia: Monitor Anesthesia Care | Site: Eye | Laterality: Left | Wound class: Clean

## 2017-03-09 MED ORDER — FENTANYL CITRATE (PF) 100 MCG/2ML IJ SOLN
INTRAMUSCULAR | Status: DC | PRN
  Administered 2017-03-09: 25 ug via INTRAVENOUS

## 2017-03-09 MED ORDER — LIDOCAINE HCL (PF) 4 % IJ SOLN
INTRAOCULAR | Status: DC | PRN
  Administered 2017-03-09: 2 mL via OPHTHALMIC

## 2017-03-09 MED ORDER — POVIDONE-IODINE 5 % OP SOLN
OPHTHALMIC | Status: AC
  Filled 2017-03-09: qty 30

## 2017-03-09 MED ORDER — EPINEPHRINE PF 1 MG/ML IJ SOLN
INTRAOCULAR | Status: DC | PRN
  Administered 2017-03-09: 1 mL via OPHTHALMIC

## 2017-03-09 MED ORDER — NA CHONDROIT SULF-NA HYALURON 40-17 MG/ML IO SOLN
INTRAOCULAR | Status: AC
  Filled 2017-03-09: qty 1

## 2017-03-09 MED ORDER — POVIDONE-IODINE 5 % OP SOLN
OPHTHALMIC | Status: DC | PRN
  Administered 2017-03-09: 1 via OPHTHALMIC

## 2017-03-09 MED ORDER — LIDOCAINE HCL (PF) 4 % IJ SOLN
INTRAMUSCULAR | Status: AC
  Filled 2017-03-09: qty 5

## 2017-03-09 MED ORDER — ARMC OPHTHALMIC DILATING DROPS
1.0000 "application " | OPHTHALMIC | Status: AC | PRN
  Administered 2017-03-09 (×3): 1 via OPHTHALMIC

## 2017-03-09 MED ORDER — CARBACHOL 0.01 % IO SOLN
INTRAOCULAR | Status: DC | PRN
  Administered 2017-03-09: .5 mL via INTRAOCULAR

## 2017-03-09 MED ORDER — ARMC OPHTHALMIC DILATING DROPS
OPHTHALMIC | Status: AC
  Administered 2017-03-09: 1 via OPHTHALMIC
  Filled 2017-03-09: qty 0.4

## 2017-03-09 MED ORDER — NA CHONDROIT SULF-NA HYALURON 40-17 MG/ML IO SOLN
INTRAOCULAR | Status: DC | PRN
  Administered 2017-03-09: 1 mL via INTRAOCULAR

## 2017-03-09 MED ORDER — MOXIFLOXACIN HCL 0.5 % OP SOLN
1.0000 [drp] | OPHTHALMIC | Status: DC | PRN
  Filled 2017-03-09: qty 3

## 2017-03-09 MED ORDER — MIDAZOLAM HCL 2 MG/2ML IJ SOLN
INTRAMUSCULAR | Status: DC | PRN
  Administered 2017-03-09: 1 mg via INTRAVENOUS

## 2017-03-09 MED ORDER — MIDAZOLAM HCL 2 MG/2ML IJ SOLN
INTRAMUSCULAR | Status: AC
  Filled 2017-03-09: qty 2

## 2017-03-09 MED ORDER — SODIUM CHLORIDE 0.9 % IV SOLN
INTRAVENOUS | Status: DC
  Administered 2017-03-09 (×2): via INTRAVENOUS

## 2017-03-09 MED ORDER — EPINEPHRINE PF 1 MG/ML IJ SOLN
INTRAMUSCULAR | Status: AC
  Filled 2017-03-09: qty 1

## 2017-03-09 MED ORDER — MOXIFLOXACIN HCL 0.5 % OP SOLN
OPHTHALMIC | Status: AC
  Filled 2017-03-09: qty 3

## 2017-03-09 MED ORDER — FENTANYL CITRATE (PF) 100 MCG/2ML IJ SOLN
INTRAMUSCULAR | Status: AC
  Filled 2017-03-09: qty 2

## 2017-03-09 MED ORDER — MOXIFLOXACIN HCL 0.5 % OP SOLN
OPHTHALMIC | Status: DC | PRN
  Administered 2017-03-09: .2 mL via OPHTHALMIC

## 2017-03-09 SURGICAL SUPPLY — 16 items
GLOVE BIO SURGEON STRL SZ8 (GLOVE) ×3 IMPLANT
GLOVE BIOGEL M 6.5 STRL (GLOVE) ×3 IMPLANT
GLOVE SURG LX 8.0 MICRO (GLOVE) ×2
GLOVE SURG LX STRL 8.0 MICRO (GLOVE) ×1 IMPLANT
GOWN STRL REUS W/ TWL LRG LVL3 (GOWN DISPOSABLE) ×2 IMPLANT
GOWN STRL REUS W/TWL LRG LVL3 (GOWN DISPOSABLE) ×6
LABEL CATARACT MEDS ST (LABEL) ×3 IMPLANT
LENS IOL TECNIS ITEC 20.5 (Intraocular Lens) ×2 IMPLANT
PACK CATARACT (MISCELLANEOUS) ×3 IMPLANT
PACK CATARACT BRASINGTON LX (MISCELLANEOUS) ×3 IMPLANT
PACK EYE AFTER SURG (MISCELLANEOUS) ×3 IMPLANT
SOL BSS BAG (MISCELLANEOUS) ×3
SOLUTION BSS BAG (MISCELLANEOUS) ×1 IMPLANT
SYR 5ML LL (SYRINGE) ×3 IMPLANT
WATER STERILE IRR 250ML POUR (IV SOLUTION) ×3 IMPLANT
WIPE NON LINTING 3.25X3.25 (MISCELLANEOUS) ×3 IMPLANT

## 2017-03-09 NOTE — Anesthesia Postprocedure Evaluation (Signed)
Anesthesia Post Note  Patient: Chloe Perkins  Procedure(s) Performed: CATARACT EXTRACTION PHACO AND INTRAOCULAR LENS PLACEMENT ([facility]) (Left Eye)  Patient location during evaluation: Short Stay Anesthesia Type: MAC Level of consciousness: awake and awake and alert Pain management: pain level controlled Vital Signs Assessment: post-procedure vital signs reviewed and stable Respiratory status: spontaneous breathing Cardiovascular status: blood pressure returned to baseline and stable Anesthetic complications: no     Last Vitals:  Vitals:   03/09/17 0829  BP: (!) 126/55  Pulse: 78  Resp: 16  Temp: (!) 35.6 C  SpO2: 98%    Last Pain:  Vitals:   03/09/17 0829  TempSrc: Tympanic                 Buckner Malta

## 2017-03-09 NOTE — Discharge Instructions (Signed)
Eye Surgery Discharge Instructions  Expect mild scratchy sensation or mild soreness. DO NOT RUB YOUR EYE!  The day of surgery:  Minimal physical activity, but bed rest is not required  No reading, computer work, or close hand work  No bending, lifting, or straining.  May watch TV  For 24 hours:  No driving, legal decisions, or alcoholic beverages  Safety precautions  Eat anything you prefer: It is better to start with liquids, then soup then solid foods.  _____ Eye patch should be worn until postoperative exam tomorrow.  ____ Solar shield eyeglasses should be worn for comfort in the sunlight/patch while sleeping  Resume all regular medications including aspirin or Coumadin if these were discontinued prior to surgery. You may shower, bathe, shave, or wash your hair. Tylenol may be taken for mild discomfort.  Call your doctor if you experience significant pain, nausea, or vomiting, fever > 101 or other signs of infection. [phone] or [phone] Specific instructions:  Follow-up Information    Birder Robson, MD Follow up.   Specialty:  Ophthalmology Why:  January 30 at 10:50am Contact information: [address] [place] [address] [phone]

## 2017-03-09 NOTE — Anesthesia Post-op Follow-up Note (Signed)
Anesthesia QCDR form completed.        

## 2017-03-09 NOTE — H&P (Signed)
All labs reviewed. Abnormal studies sent to patients PCP when indicated.  Previous H&P reviewed, patient examined, there are NO CHANGES.  Chloe Gatchel Porfilio1/29/20199:38 AM

## 2017-03-09 NOTE — Op Note (Signed)
PREOPERATIVE DIAGNOSIS:  Nuclear sclerotic cataract of the left eye.   POSTOPERATIVE DIAGNOSIS:  Nuclear sclerotic cataract of the left eye.   OPERATIVE PROCEDURE: Procedure(s): CATARACT EXTRACTION PHACO AND INTRAOCULAR LENS PLACEMENT ([facility])   SURGEON:  Birder Robson, MD.   ANESTHESIA:  No anesthesia staff entered.  1.      Managed anesthesia care. 2.     0.76ml of Shugarcaine was instilled following the paracentesis   COMPLICATIONS:  None.   TECHNIQUE:   Stop and chop   DESCRIPTION OF PROCEDURE:  The patient was examined and consented in the preoperative holding area where the aforementioned topical anesthesia was applied to the left eye and then brought back to the Operating Room where the left eye was prepped and draped in the usual sterile ophthalmic fashion and a lid speculum was placed. A paracentesis was created with the side port blade and the anterior chamber was filled with viscoelastic. A near clear corneal incision was performed with the steel keratome. A continuous curvilinear capsulorrhexis was performed with a cystotome followed by the capsulorrhexis forceps. Hydrodissection and hydrodelineation were carried out with BSS on a blunt cannula. The lens was removed in a stop and chop  technique and the remaining cortical material was removed with the irrigation-aspiration handpiece. The capsular bag was inflated with viscoelastic and the Technis ZCB00 lens was placed in the capsular bag without complication. The remaining viscoelastic was removed from the eye with the irrigation-aspiration handpiece. The wounds were hydrated. The anterior chamber was flushed with Miostat and the eye was inflated to physiologic pressure. 0.45ml Vigamox was placed in the anterior chamber. The wounds were found to be water tight. The eye was dressed with Vigamox. The patient was given protective glasses to wear throughout the day and a shield with which to sleep tonight. The patient was also given drops  with which to begin a drop regimen today and will follow-up with me in one day. Implant Name Type Inv. Item Serial No. Manufacturer Lot No. LRB No. Used  LENS IOL DIOP 20.5 - [id number] Intraocular Lens LENS IOL DIOP 20.5 [id number]  Left 1    Procedure(s) with comments: CATARACT EXTRACTION PHACO AND INTRAOCULAR LENS PLACEMENT ([facility]) (Left) - [place] 00:23 AP% 16.3 CDE 3.85 Fluid pack lot # [id number]  Electronically signed: Birder Robson 03/09/2017 10:02 AM

## 2017-03-09 NOTE — Transfer of Care (Signed)
Immediate Anesthesia Transfer of Care Note  Patient: Chloe Perkins  Procedure(s) Performed: CATARACT EXTRACTION PHACO AND INTRAOCULAR LENS PLACEMENT ([facility]) (Left Eye)  Patient Location: PACU  Anesthesia Type:MAC  Level of Consciousness: awake  Airway & Oxygen Therapy: Patient Spontanous Breathing and Patient connected to nasal cannula oxygen  Post-op Assessment: Report given to RN and Post -op Vital signs reviewed and stable  Post vital signs: Reviewed and stable  Last Vitals:  Vitals:   03/09/17 0829  BP: (!) 126/55  Pulse: 78  Resp: 16  Temp: (!) 35.6 C  SpO2: 98%    Last Pain:  Vitals:   03/09/17 0829  TempSrc: Tympanic         Complications: No apparent anesthesia complications

## 2017-03-09 NOTE — Anesthesia Preprocedure Evaluation (Signed)
Anesthesia Evaluation  Patient identified by MRN, date of birth, ID band Patient awake    Reviewed: Allergy & Precautions, NPO status , Patient's Chart, lab work & pertinent test results, reviewed documented beta blocker date and time   History of Anesthesia Complications Negative for: history of anesthetic complications  Airway Mallampati: I  TM Distance: >3 FB     Dental  (+) Chipped   Pulmonary neg shortness of breath, asthma , neg recent URI, former smoker,           Cardiovascular Exercise Tolerance: Good hypertension, Pt. on medications and Pt. on home beta blockers (-) angina(-) CAD, (-) Past MI, (-) Cardiac Stents and (-) CABG + dysrhythmias + Valvular Problems/Murmurs      Neuro/Psych    GI/Hepatic   Endo/Other  Hypothyroidism   Renal/GU Renal disease     Musculoskeletal  (+) Arthritis ,   Abdominal   Peds  Hematology   Anesthesia Other Findings Past Medical History: No date: Anxiety and depression No date: Arthritis No date: Asthmatic bronchitis No date: Bronchitis     Comment:  history of No date: Cancer ([place])     Comment:  MELANOMA No date: Depression No date: Dysrhythmia No date: Heart murmur No date: Hemorrhoids No date: History of melanoma No date: HLD (hyperlipidemia) No date: Hypertension No date: Mitral valve prolapse No date: Osteoporosis No date: Personal history of malignant melanoma of skin No date: SVT (supraventricular tachycardia) (HCC) No date: Viral gastroenteritis   Reproductive/Obstetrics                             Anesthesia Physical  Anesthesia Plan  ASA: III  Anesthesia Plan: MAC   Post-op Pain Management:    Induction: Intravenous  PONV Risk Score and Plan: 2  Airway Management Planned: Nasal Cannula  Additional Equipment:   Intra-op Plan:   Post-operative Plan:   Informed Consent: I have reviewed the patients History and  Physical, chart, labs and discussed the procedure including the risks, benefits and alternatives for the proposed anesthesia with the patient or authorized representative who has indicated his/her understanding and acceptance.     Plan Discussed with: CRNA  Anesthesia Plan Comments:         Anesthesia Quick Evaluation

## 2017-03-10 ENCOUNTER — Ambulatory Visit (INDEPENDENT_AMBULATORY_CARE_PROVIDER_SITE_OTHER): Payer: Medicare Other | Admitting: *Deleted

## 2017-03-10 DIAGNOSIS — E538 Deficiency of other specified B group vitamins: Secondary | ICD-10-CM | POA: Diagnosis not present

## 2017-03-10 MED ORDER — CYANOCOBALAMIN 1000 MCG/ML IJ SOLN
1000.0000 ug | Freq: Once | INTRAMUSCULAR | Status: AC
  Administered 2017-03-10: 1000 ug via INTRAMUSCULAR

## 2017-03-10 NOTE — Progress Notes (Signed)
Patient presented for B 12 injection to right deltoid, patient voiced no concerns nor showed any signs of distress during injection. 

## 2017-03-14 NOTE — Progress Notes (Signed)
  I have reviewed the above information and agree with above.   Logann Whitebread, MD 

## 2017-03-15 ENCOUNTER — Other Ambulatory Visit: Payer: Self-pay | Admitting: Internal Medicine

## 2017-03-15 DIAGNOSIS — Z1231 Encounter for screening mammogram for malignant neoplasm of breast: Secondary | ICD-10-CM

## 2017-03-23 IMAGING — CR DG CHEST 2V
2 series · 2 of 2 positions shown · non-contrast
Comparison: 06/23/2012.

CLINICAL DATA: Dry cough and shortness of breath for 2 months.

EXAM:
CHEST  2 VIEW

[chest pa]
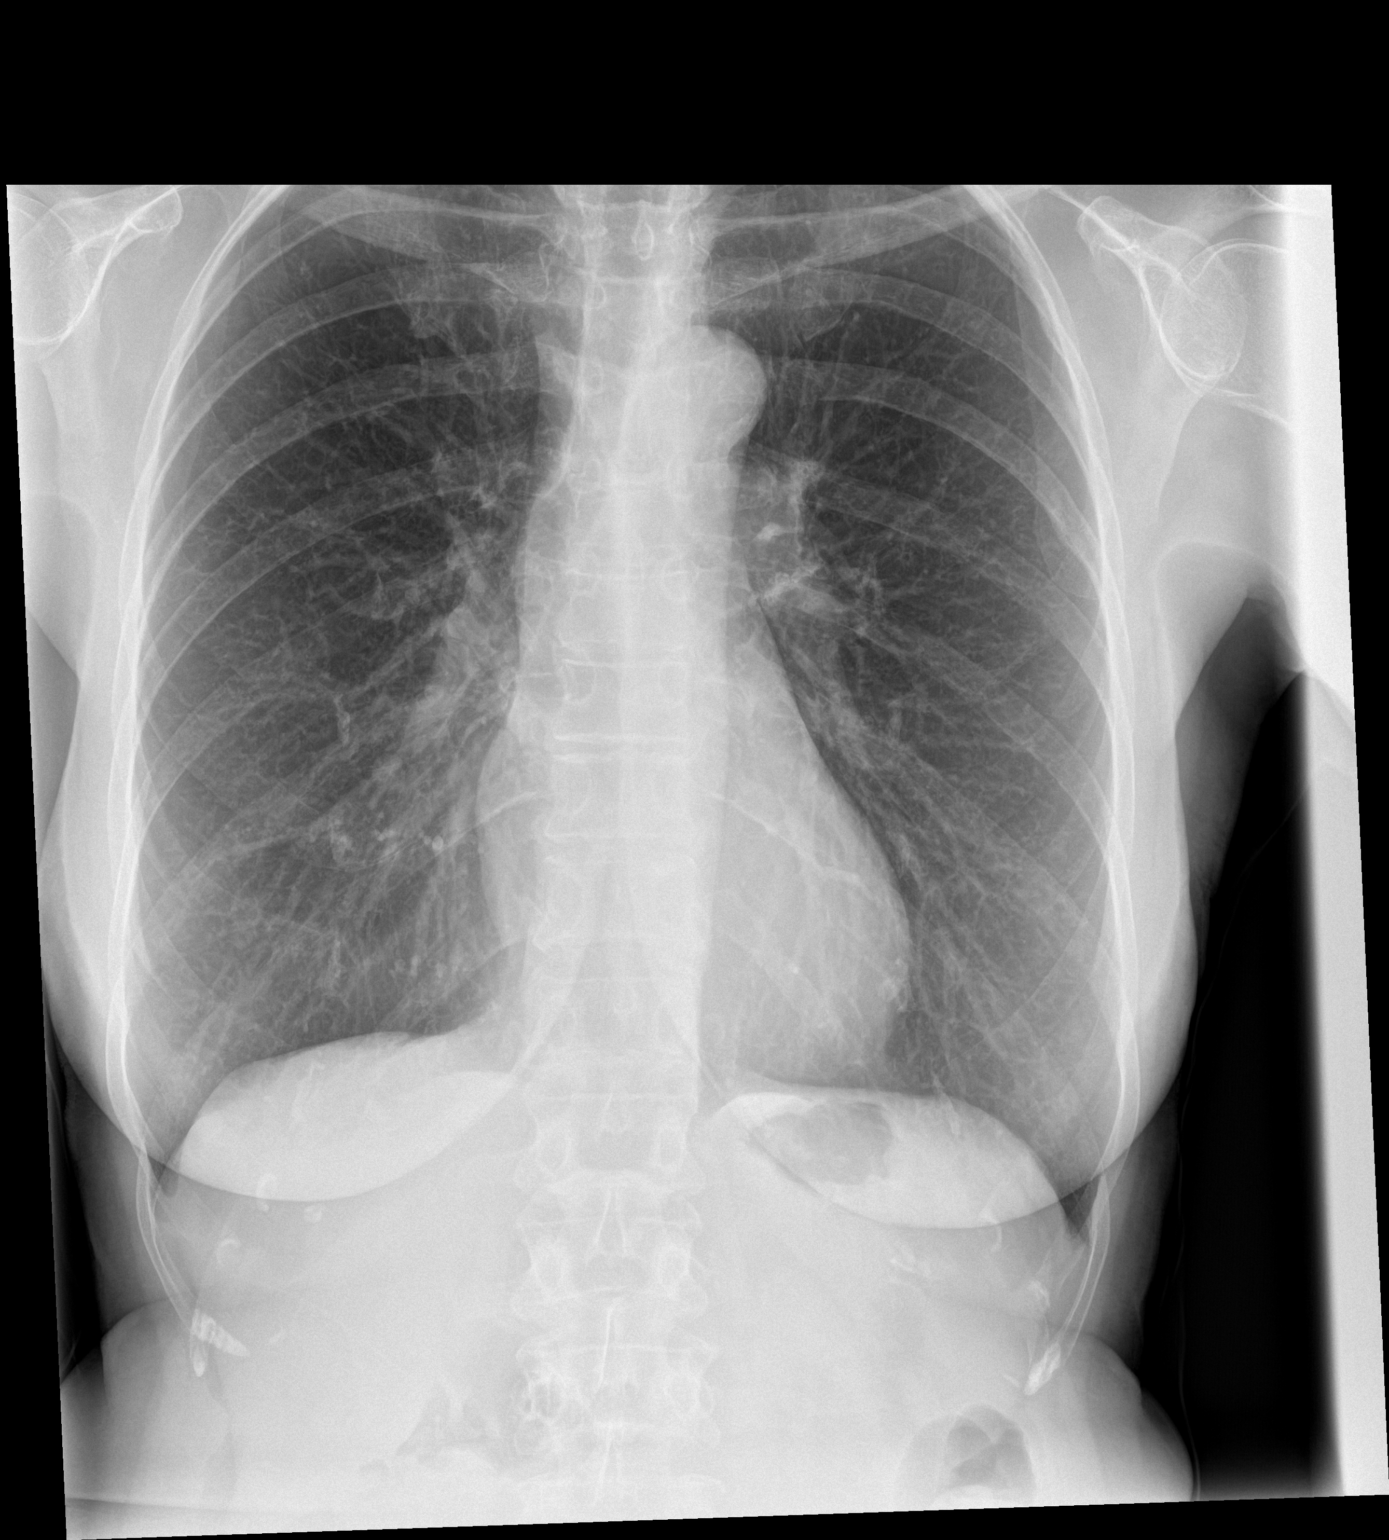

[chest lat]
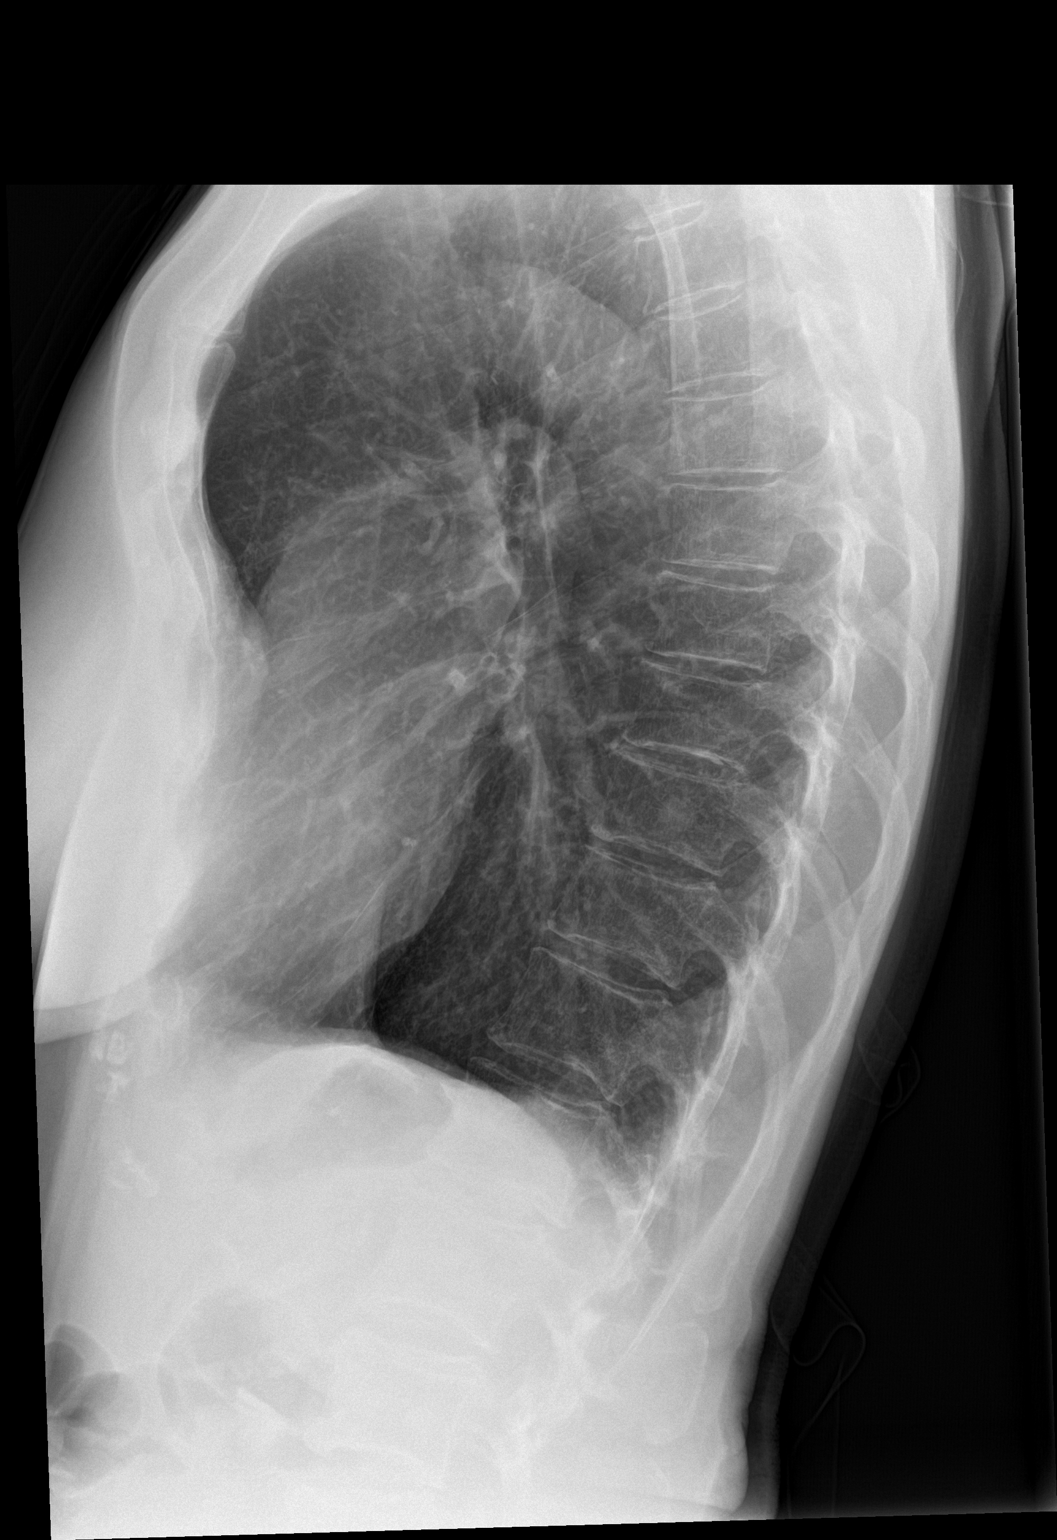

[2 of 2 positions shown; findings below may reference images not displayed]

FINDINGS: Trachea is midline. Heart size normal. Lungs are hyperinflated. On
the lateral view, there is a rounded density projecting over a lower
thoracic vertebral body, not well correlated on the frontal view.
Finding appears new from 06/23/2012. Lungs are otherwise clear. No
pleural fluid. Pectus deformity.
IMPRESSION: Nodular density projects over the spine on the lateral view. Finding
is not well correlated on the frontal view but appears new from
06/23/2012. CT chest without contrast is recommended in further
evaluation, as clinically indicated. These results will be called to
the ordering clinician or representative by the Radiologist
Assistant, and communication documented in the PACS or zVision
Dashboard.

## 2017-03-24 ENCOUNTER — Ambulatory Visit: Payer: Medicare Other

## 2017-04-08 ENCOUNTER — Ambulatory Visit (INDEPENDENT_AMBULATORY_CARE_PROVIDER_SITE_OTHER): Payer: Medicare Other

## 2017-04-08 DIAGNOSIS — E538 Deficiency of other specified B group vitamins: Secondary | ICD-10-CM | POA: Diagnosis not present

## 2017-04-08 MED ORDER — CYANOCOBALAMIN 1000 MCG/ML IJ SOLN
1000.0000 ug | Freq: Once | INTRAMUSCULAR | Status: AC
  Administered 2017-04-08: 1000 ug via INTRAMUSCULAR

## 2017-04-08 NOTE — Progress Notes (Addendum)
Patient presents today for a B12 injection. Left deltoid. Patient voiced no concerns nor showed any signs of distress during injection.   Reviewed.  Dr Nicki Reaper

## 2017-04-13 ENCOUNTER — Ambulatory Visit: Payer: Medicare Other

## 2017-05-05 ENCOUNTER — Encounter: Payer: Self-pay | Admitting: Cardiovascular Disease

## 2017-05-05 ENCOUNTER — Ambulatory Visit (INDEPENDENT_AMBULATORY_CARE_PROVIDER_SITE_OTHER): Payer: Medicare Other | Admitting: Cardiovascular Disease

## 2017-05-05 ENCOUNTER — Other Ambulatory Visit
Admission: RE | Admit: 2017-05-05 | Discharge: 2017-05-05 | Disposition: A | Discharge status: Home / Self Care | Payer: Medicare Other | Source: Ambulatory Visit | Attending: Cardiovascular Disease | Admitting: Cardiovascular Disease

## 2017-05-05 VITALS — BP 142/87 | HR 67 | Ht 63.0 in | Wt 117.0 lb

## 2017-05-05 DIAGNOSIS — Z79899 Other long term (current) drug therapy: Secondary | ICD-10-CM

## 2017-05-05 DIAGNOSIS — I10 Essential (primary) hypertension: Secondary | ICD-10-CM | POA: Diagnosis not present

## 2017-05-05 DIAGNOSIS — E785 Hyperlipidemia, unspecified: Secondary | ICD-10-CM | POA: Insufficient documentation

## 2017-05-05 DIAGNOSIS — I6529 Occlusion and stenosis of unspecified carotid artery: Secondary | ICD-10-CM

## 2017-05-05 DIAGNOSIS — F419 Anxiety disorder, unspecified: Secondary | ICD-10-CM

## 2017-05-05 DIAGNOSIS — F329 Major depressive disorder, single episode, unspecified: Secondary | ICD-10-CM | POA: Diagnosis not present

## 2017-05-05 DIAGNOSIS — I471 Supraventricular tachycardia: Secondary | ICD-10-CM | POA: Diagnosis not present

## 2017-05-05 DIAGNOSIS — I2510 Atherosclerotic heart disease of native coronary artery without angina pectoris: Secondary | ICD-10-CM

## 2017-05-05 DIAGNOSIS — F32A Depression, unspecified: Secondary | ICD-10-CM

## 2017-05-05 LAB — COMPREHENSIVE METABOLIC PANEL
ALBUMIN: 4.2 g/dL (ref 3.5–5.0)
ALT: 13 U/L — ABNORMAL LOW (ref 14–54)
AST: 21 U/L (ref 15–41)
Alkaline Phosphatase: 64 U/L (ref 38–126)
Anion gap: 7 (ref 5–15)
BUN: 17 mg/dL (ref 6–20)
CHLORIDE: 106 mmol/L (ref 101–111)
CO2: 26 mmol/L (ref 22–32)
Calcium: 9.2 mg/dL (ref 8.9–10.3)
Creatinine, Ser: 1.07 mg/dL — ABNORMAL HIGH (ref 0.44–1.00)
GFR calc Af Amer: 59 mL/min — ABNORMAL LOW (ref >60)
GFR, EST NON AFRICAN AMERICAN: 51 mL/min — AB (ref >60)
GLUCOSE: 98 mg/dL (ref 65–99)
POTASSIUM: 4.4 mmol/L (ref 3.5–5.1)
Sodium: 139 mmol/L (ref 135–145)
Total Bilirubin: 0.6 mg/dL (ref 0.3–1.2)
Total Protein: 7.3 g/dL (ref 6.5–8.1)

## 2017-05-05 LAB — LIPID PANEL
Cholesterol: 208 mg/dL — ABNORMAL HIGH (ref 0–200)
HDL: 80 mg/dL (ref >40)
LDL CALC: 118 mg/dL — AB (ref 0–99)
Total CHOL/HDL Ratio: 2.6 RATIO
Triglycerides: 51 mg/dL (ref <150)
VLDL: 10 mg/dL (ref 0–40)

## 2017-05-05 MED ORDER — EZETIMIBE 10 MG PO TABS: 10.0000 mg | ORAL_TABLET | Freq: Every day | ORAL | 3 refills | Status: DC

## 2017-05-05 MED ORDER — ROSUVASTATIN CALCIUM 10 MG PO TABS: 10.0000 mg | ORAL_TABLET | Freq: Every day | ORAL | 3 refills | Status: DC

## 2017-05-05 MED ORDER — METOPROLOL TARTRATE 25 MG PO TABS: ORAL_TABLET | ORAL | 3 refills | Status: DC

## 2017-05-05 MED ORDER — DILTIAZEM HCL ER COATED BEADS 180 MG PO CP24: 180.0000 mg | ORAL_CAPSULE | Freq: Every day | ORAL | 3 refills | Status: DC

## 2017-05-05 MED ORDER — LOSARTAN POTASSIUM 100 MG PO TABS: 100.0000 mg | ORAL_TABLET | Freq: Every day | ORAL | 3 refills | Status: DC

## 2017-05-05 NOTE — Patient Instructions (Signed)
Medication Instructions:  START Zetia 10 mg daily INCREASE Losartan to 100 mg daily   Labwork: Please return for FASTING labs in 3-4 months (CMET, CBC, Lipid, TSH)  Our in office lab hours are Monday-Friday 8:00-4:00, closed for lunch 12:45-1:45 pm.  No appointment needed.  Follow-Up: 4 months with Dr. Claiborne Billings    If you need a refill on your cardiac medications before your next appointment, please call your pharmacy.

## 2017-05-06 ENCOUNTER — Encounter: Payer: Self-pay | Admitting: Cardiovascular Disease

## 2017-05-06 ENCOUNTER — Ambulatory Visit (INDEPENDENT_AMBULATORY_CARE_PROVIDER_SITE_OTHER): Payer: Medicare Other | Admitting: *Deleted

## 2017-05-06 DIAGNOSIS — E538 Deficiency of other specified B group vitamins: Secondary | ICD-10-CM | POA: Diagnosis not present

## 2017-05-06 MED ORDER — CYANOCOBALAMIN 1000 MCG/ML IJ SOLN
1000.0000 ug | Freq: Once | INTRAMUSCULAR | Status: AC
  Administered 2017-05-06: 1000 ug via INTRAMUSCULAR

## 2017-05-06 NOTE — Progress Notes (Signed)
Patient presented for B 12 injection to left deltoid, patient voiced no concerns nor showed any signs of distress during injection. 

## 2017-05-06 NOTE — Progress Notes (Signed)
Patient ID: Chloe Perkins, female   DOB: 1945-08-13, 72 y.o.   MRN: [id number]    Primary M.D.: Dr. Deborra Medina  HPI: Chloe Perkins is a 72 y.o. female who presents to the office for a 3 week cardiology follow-up evaluation.  Ms. Terriquez has a history of SVT and hypertension, for which she has been on diltiazem at 120 mg  and losartan 50 mg daily.  She has  also remote history of thyroid abnormality and in the past  had taken levothyroxine.  She has mild renal insufficiency with stage III renal disease.  Has a history of anxiety/depression for which she takes Wellbutrin XL 300 mg daily.  She also is on calcium with vitamin D in light of osteoporosis.  Recently, she believes that she has been experiencing more episodes of palpitations which are short-lived and at times occur daily.  She denies associated presyncope or syncope.  She denies chest pain.  She recently underwent a panoramix dental x-ray and was told by her dentist that she may have calcification of her carotids.   She has a history of osteoporosis, as well as vitamin B12 deficiency and has previously been found to have stable solitary pulmonary nodule.  She had a follow-up echo Doppler study in November 2016 which showed an ejection fraction at 60-65%.  She had normal diastolic parameters.  There was turbulent flow at the base of her aortic valve in the region of the right coronary cusp which was most likely the cause of her systolic murmur.    I last saw her, she was experiencing  transient palpitations lasting less than 5 seconds, almost on a daily basis.  She has been using caffeine.  She underwent a carotid duplex study in July 2017 which showed heterogeneous plaque bilaterally and 1-39% bilateral ICA stenoses with normal subclavian arteries, and patent vertebral arteries with antegrade flow.  I saw her, I further titrated her Cardizem and suggested that she take 240 mg at bedtime.  She's continued to take losartan 75 mg in the  morning.  Since I  saw her in February 2018, she states that she has continued to experience short-lived episodes of asymptomatic palpitations which may last anywhere from 5-10 seconds, but seemed to occur on a daily basis.  She has been on slow acting diltiazem at 240 mg daily and has continued to take losartan 75 mg daily for hypertension.  She also is on Ativan and Wellbutrin.   She underwent a CT scan by Dr. Deborra Medina.  She was found to have a stable 1 cm nodule in the right lower lobe that was unchanged from previously.  Incidentally, she was also noted to have coronary artery calcification suggestive of CAD.  Once I found out about this, I had contacted her and recommended initiation of Crestor at an initial very low dose of 10 mg.  Since she was having almost daily palpitations when I saw her several weeks ago  I also attempted to add very low-dose metoprolol succinate at 12.5 mg.  Her heart rate had dropped into the upper 40s with this and she stopped taking it; however, toprol did improve her daily palpitations.    I saw her in follow-up shortly thereafter.  And had a lengthy discussion with her regarding the fact that she has demonstration of subclinical atherosis in the importance of trying to induce plaque regression.  After much discussion, she agreed to initiate low-dose statin therapy.  She was started on low-dose Crestor.  Apparently, she self discontinued this since she felt this caused her to be nauseated.  On for brief 14 2018.  Repeat laboratory off treatment showed a total cholesterol 2:15, HDL 87, LDL 114, and triglycerides 71.  She denies recent palpitations.  She presents for follow-up evaluation.  Past Medical History:  Diagnosis Date  . Anxiety and depression   . Arthritis   . Asthmatic bronchitis   . Bronchitis    history of  . Cancer ([place])    MELANOMA  . Depression   . Dysrhythmia   . Heart murmur   . Hemorrhoids   . History of melanoma   . HLD (hyperlipidemia)     . Hypertension   . Mitral valve prolapse   . Osteoporosis   . Personal history of malignant melanoma of skin   . SVT (supraventricular tachycardia) ([place])   . Viral gastroenteritis     Past Surgical History:  Procedure Laterality Date  . ABDOMINAL HYSTERECTOMY  1975  . CATARACT EXTRACTION W/PHACO Right 02/10/2017   Procedure: CATARACT EXTRACTION PHACO AND INTRAOCULAR LENS PLACEMENT (IOC);  Surgeon: Birder Robson, MD;  Location: [facility] ORS;  Service: Ophthalmology;  Laterality: Right;  [place] 00:35.9 AP% 11.5 CDE 4.11 Fluid Pack Lot # [id number]  . CATARACT EXTRACTION W/PHACO Left 03/09/2017   Procedure: CATARACT EXTRACTION PHACO AND INTRAOCULAR LENS PLACEMENT (IOC);  Surgeon: Birder Robson, MD;  Location: [facility] ORS;  Service: Ophthalmology;  Laterality: Left;  [place] 00:23 AP% 16.3 CDE 3.85 Fluid pack lot # [id number]  . COLONOSCOPY  04/15/2004   2006: Normal  . ENDOMETRIAL ABLATION  1976  . left thumb tendon repair  10/09  . MELANOMA EXCISION  1999   superficial spreading melanoma right post shoulder  . NM MYOCAR PERF WALL MOTION  12/28/2008   No ischemia  . SKIN CANCER EXCISION  03/02/2013   superficial basal cell cancer  . US ECHOCARDIOGRAPHY  10/16/2010   mitral valve leaflets midly thickened,trace MR.  . WRIST SURGERY Left 2011    Allergies  Allergen Reactions  . Codeine Nausea And Vomiting  . Adhesive [Tape] Rash    PAPER TAPE ONLY (INTOLERANCE TO LEADS--EKG)  . Alendronate Sodium Other (See Comments)    REACTION: pt states INTOL to Fosamax  . Verapamil Other (See Comments)    REACTION: Intol w/ bradycardia    Current Outpatient Medications  Medication Sig Dispense Refill  . buPROPion (WELLBUTRIN XL) 150 MG 24 hr tablet Take 450 mg by mouth daily.    . calcium-vitamin D (OSCAL WITH D) 500-200 MG-UNIT per tablet Take 1 tablet by mouth 2 (two) times daily.      . cyanocobalamin (,VITAMIN B-12,) 1000 MCG/ML injection Inject 1,000 mcg into the muscle every 30 (thirty) days.     Marland Kitchen diltiazem (CARDIZEM CD) 180 MG 24 hr capsule Take 1 capsule (180 mg total) by mouth daily. 90 capsule 3  . LORazepam (ATIVAN) 0.5 MG tablet Take 0.5 mg by mouth See admin instructions. TAKE 1 TABLET (0.5 MG) SCHEDULED IN THE MORNING & 1 TABLET (0.5 MG) IN THE EVENING AS NEEDED FOR ANXIETY.  5  . losartan (COZAAR) 100 MG tablet Take 1 tablet (100 mg total) by mouth daily. 90 tablet 3  . meloxicam (MOBIC) 15 MG tablet Take 1 tablet (15 mg total) daily by mouth. 20 tablet 0  . metoprolol tartrate (LOPRESSOR) 25 MG tablet Take 1/2-1 tablet as needed for palpitations 30 tablet 3  . Polyethyl Glycol-Propyl Glycol (SYSTANE) 0.4-0.3 % SOLN Place 1-2 drops into  both eyes 3 (three) times daily as needed (for dry eyes.).    Marland Kitchen ezetimibe (ZETIA) 10 MG tablet Take 1 tablet (10 mg total) by mouth daily. 90 tablet 3  . rosuvastatin (CRESTOR) 10 MG tablet Take 1 tablet (10 mg total) by mouth daily. 90 tablet 3   No current facility-administered medications for this visit.     Social History   Socioeconomic History  . Marital status: Married    Spouse name: sammy Glassner  . Number of children: 1  . Years of education: Not on file  . Highest education level: Not on file  Occupational History  . Occupation: Retired  Scientific laboratory technician  . Financial resource strain: Not on file  . Food insecurity:    Worry: Not on file    Inability: Not on file  . Transportation needs:    Medical: Not on file    Non-medical: Not on file  Tobacco Use  . Smoking status: Former Smoker    Packs/day: 0.50    Years: 20.00    Pack years: 10.00    Types: Cigarettes    Last attempt to quit: 02/09/1990    Years since quitting: 27.2  . Smokeless tobacco: Never Used  Substance and Sexual Activity  . Alcohol use: No    Alcohol/week: 0.0 oz  . Drug use: No  . Sexual activity: Yes  Lifestyle  . Physical activity:    Days per week: Not on file    Minutes per session: Not on file  . Stress: Not on file  Relationships  .  Social connections:    Talks on phone: Not on file    Gets together: Not on file    Attends religious service: Not on file    Active member of club or organization: Not on file    Attends meetings of clubs or organizations: Not on file    Relationship status: Not on file  . Intimate partner violence:    Fear of current or ex partner: Not on file    Emotionally abused: Not on file    Physically abused: Not on file    Forced sexual activity: Not on file  Other Topics Concern  . Not on file  Social History Narrative   She lives her family, she has one child, retired, no smoke no drink    Family History  Problem Relation Age of Onset  . Emphysema Father        PGF, brother, sister  . COPD Father        PGF, brother, sister  . Heart disease Father   . Rheum arthritis Mother   . Stroke Mother   . Heart disease Mother        Father, brother, sister  . Lung cancer Paternal Grandfather   . Ovarian cancer Paternal Aunt   . Breast cancer Cousin   . Cancer Brother   . Heart disease Brother   . Heart disease Sister     ROS General: Negative; No fevers, chills, or night sweats;  HEENT: Negative; No changes in vision or hearing, sinus congestion, difficulty swallowing Pulmonary: Stable lung nodule Cardiovascular: Palpitations improved GI: Negative; No nausea, vomiting, diarrhea, or abdominal pain GU: Negative; No dysuria, hematuria, or difficulty voiding Musculoskeletal: she complains of chronic back pain.  Hematologic/Oncology: Negative; no easy bruising, bleeding Endocrine: Negative; no heat/cold intolerance; no diabetes Neuro: Negative; no changes in balance, headaches Skin: Negative; No rashes or skin lesions Psychiatric: Positive for anxiety/depression on Wellbutrin Sleep: Negative;  No snoring, daytime sleepiness, hypersomnolence, bruxism, restless legs, hypnogognic hallucinations, no cataplexy Other comprehensive 14 point system review is negative.   PE BP (!) 142/87    Pulse 67   Ht 5' 3"  (1.6 m)   Wt 117 lb (53.1 kg)   BMI 20.73 kg/m    Repeat blood pressure by me was 140/86  Wt Readings from Last 3 Encounters:  05/05/17 117 lb (53.1 kg)  03/01/17 116 lb (52.6 kg)  02/01/17 116 lb (52.6 kg)    General: Alert, oriented, no distress.  Skin: normal turgor, no rashes, warm and dry HEENT: Normocephalic, atraumatic. Pupils equal round and reactive to light; sclera anicteric; extraocular muscles intact; Nose without nasal septal hypertrophy Mouth/Parynx benign; Mallinpatti scale 2 Neck: No JVD, no carotid bruits; normal carotid upstroke Lungs: clear to ausculatation and percussion; no wheezing or rales Chest wall: without tenderness to palpitation Heart: PMI not displaced, regular today without previous ectopy which was heard at her last office visit, s1 s2 normal, 1/6 systolic murmur, no diastolic murmur, no rubs, gallops, thrills, or heaves Abdomen: soft, nontender; no hepatosplenomehaly, BS+; abdominal aorta nontender and not dilated by palpation. Back: no CVA tenderness Pulses 2+ Musculoskeletal: full range of motion, normal strength, no joint deformities Extremities: no clubbing cyanosis or edema, Homan's sign negative  Neurologic: grossly nonfocal; Cranial nerves grossly wnl Psychologic: Normal mood and affect  ECG (independently read by me): normal sinus rhythm at 62 with a PAC.  Low voltage.  Normal intervals.  October 2018 ECG (independently read by me): Sinus rhythm at 65 bpm, PAC.  PR interval 128 ms, QTc interval 428 ms.  September 2018 ECG (independently read by me): Normal sinus rhythm at 64 bpm, PA-C, mild RV conduction delay.  Nonspecific ST changes.  February 2017 ECG (independently read by me): Sinus bradycardia 53 bpm..  Mild RV conduction delay.  Normal intervals.  November 2017 ECG (independently read by me): Normal sinus rhythm at 61 bpm.  No ectopy.  Normal intervals.  July 2017 ECG (independently read by me): Sinus rhythm  at 60 bpm with PAC.  Normal intervals.  November 2016 ECG (independently read by me): Normal sinus rhythm at 66 bpm.  Mild RV conduction delay.  Nondiagnostic small inferior Q waves.  ECG (independently read by me): sinus bradycardia 50 bpm.  Mild RV conduction delay  November 2015 ECG (independently read by me0;  Normal sinus rhythm at 77 bpm.  RV conduction delay.  Nondiagnostic small Q waves.  Nonspecific ST changes.  April 2015 ECG (independently read by me): Sinus bradycardia 54 beats per minute.  Mild RV conduction delay.  No ectopy.  LABS: BMP Latest Ref Rng & Units 05/05/2017 03/25/2016 03/18/2015  Glucose 65 - 99 mg/dL 98 95 98  BUN 6 - 20 mg/dL 17 13 16   Creatinine 0.44 - 1.00 mg/dL 1.07(H) 1.07(H) 1.02  Sodium 135 - 145 mmol/L 139 139 139  Potassium 3.5 - 5.1 mmol/L 4.4 4.2 4.8  Chloride 101 - 111 mmol/L 106 106 103  CO2 22 - 32 mmol/L 26 27 29   Calcium 8.9 - 10.3 mg/dL 9.2 9.5 10.4   Hepatic Function Latest Ref Rng & Units 05/05/2017 03/25/2016 12/11/2014  Total Protein 6.5 - 8.1 g/dL 7.3 7.2 6.4  Albumin 3.5 - 5.0 g/dL 4.2 4.4 3.9  AST 15 - 41 U/L 21 23 19   ALT 14 - 54 U/L 13(L) 14 15  Alk Phosphatase 38 - 126 U/L 64 73 80  Total Bilirubin 0.3 - 1.2 mg/dL  0.6 0.6 0.5  Bilirubin, Direct 0.0 - 0.3 mg/dL - - -   CBC Latest Ref Rng & Units 03/25/2016 12/11/2014 11/28/2014  WBC 3.6 - 11.0 K/uL 3.4(L) 5.0 3.3  Hemoglobin 12.0 - 16.0 g/dL 13.8 14.3 14.3  Hematocrit 35.0 - 47.0 % 40.5 42.5 41  Platelets 150 - 440 K/uL 170 229 202   Lab Results  Component Value Date   MCV 95.1 03/25/2016   MCV 95.5 12/11/2014   MCV 94.7 05/04/2013   Lab Results  Component Value Date   TSH 5.358 (H) 03/25/2016  No results found for: HGBA1C  Lipid Panel     Component Value Date/Time   CHOL 208 (H) 05/05/2017 0743   CHOL 203 (H) 05/22/2011 0759   TRIG 51 05/05/2017 0743   TRIG 65 05/22/2011 0759   HDL 80 05/05/2017 0743   HDL 88 (H) 05/22/2011 0759   CHOLHDL 2.6 05/05/2017 0743   VLDL  10 05/05/2017 0743   VLDL 13 05/22/2011 0759   LDLCALC 118 (H) 05/05/2017 0743   LDLCALC 102 (H) 05/22/2011 0759   LDLDIRECT 102.2 04/23/2006 1211     RADIOLOGY: No results found.  IMPRESSION:  1. Hyperlipidemia with target LDL less than 70   2. Medication management   3. Coronary artery calcification seen on CT scan   4. Essential hypertension   5. Carotid artery plaque, unspecified laterality   6. Anxiety and depression   7. SVT (supraventricular tachycardia) (HCC)     ASSESSMENT AND PLAN: Ms. Angelisse Riso is a 72 year old female who has a history of SVT as well as hypertension.In the past, she was experiencing more frequent palpitations which led to further dose escalation of Cardizem up to 240 mg with improvement. Due to  Increasing palpitations she was given a prescription for as needed  metoprolol. Presently, she has been on losartan 75 mg daily, metoprolol, which she has as an as-needed basis in addition to her Cardizem which she now has been taking 180 mg daily.  Marland Kitchen  Her blood pressure today is 140/86.  I discussed with her most recent guidelines.  I have recommended slight additional titration of losartan to 100 mg daily in attempt to obtain a blood pressure less than 130/80.  With reference to her subclinical atherosclerosis and hyperlipidemia, she discontinued Crestor since she felt this caused her to be nauseated.  I will now initiate Zetia 10 mg daily to see if she is able to tolerate this.  I again reviewed her chest CT with her.  She continues to take Wellbutrin for anxiety/depression.  This has been stable.  She has osteoporosis.  He continues to take calcium with vitamin D.  She will undergo repeat laboratory in 3-4 months and I will see her for follow-up evaluation.  ime spent: 25 minutes  Troy Sine, MD, [facility]  05/06/2017 8:26 AM

## 2017-05-07 NOTE — Addendum Note (Signed)
Addended by: Patria Mane A on: 05/07/2017 03:30 PM   Modules accepted: Orders

## 2017-06-09 ENCOUNTER — Ambulatory Visit: Payer: Medicare Other

## 2017-06-10 ENCOUNTER — Ambulatory Visit (INDEPENDENT_AMBULATORY_CARE_PROVIDER_SITE_OTHER): Payer: Medicare Other

## 2017-06-10 DIAGNOSIS — E538 Deficiency of other specified B group vitamins: Secondary | ICD-10-CM

## 2017-06-10 MED ORDER — CYANOCOBALAMIN 1000 MCG/ML IJ SOLN
1000.0000 ug | Freq: Once | INTRAMUSCULAR | Status: AC
  Administered 2017-06-10: 1000 ug via INTRAMUSCULAR

## 2017-06-10 NOTE — Progress Notes (Signed)
Patient comes in for B 12 injection.  Injected right deltoid.  Patient tolerated injection well.   

## 2017-07-06 ENCOUNTER — Encounter: Payer: Self-pay | Admitting: Cardiology

## 2017-07-06 ENCOUNTER — Telehealth: Payer: Self-pay | Admitting: Cardiovascular Disease

## 2017-07-06 ENCOUNTER — Ambulatory Visit (INDEPENDENT_AMBULATORY_CARE_PROVIDER_SITE_OTHER): Payer: Medicare Other | Admitting: Cardiology

## 2017-07-06 DIAGNOSIS — N183 Chronic kidney disease, stage 3 unspecified: Secondary | ICD-10-CM

## 2017-07-06 DIAGNOSIS — I10 Essential (primary) hypertension: Secondary | ICD-10-CM | POA: Diagnosis not present

## 2017-07-06 DIAGNOSIS — I2510 Atherosclerotic heart disease of native coronary artery without angina pectoris: Secondary | ICD-10-CM

## 2017-07-06 DIAGNOSIS — I495 Sick sinus syndrome: Secondary | ICD-10-CM | POA: Diagnosis not present

## 2017-07-06 DIAGNOSIS — E782 Mixed hyperlipidemia: Secondary | ICD-10-CM | POA: Diagnosis not present

## 2017-07-06 DIAGNOSIS — I6529 Occlusion and stenosis of unspecified carotid artery: Secondary | ICD-10-CM

## 2017-07-06 HISTORY — DX: Sick sinus syndrome: I49.5

## 2017-07-06 MED ORDER — DILTIAZEM HCL ER COATED BEADS 120 MG PO CP24: 120.0000 mg | ORAL_CAPSULE | Freq: Every day | ORAL | 3 refills | Status: DC

## 2017-07-06 NOTE — Assessment & Plan Note (Signed)
GFR 51 

## 2017-07-06 NOTE — Assessment & Plan Note (Signed)
Pt c/o palpitations as well as bradycardia- (no symptoms)- noted on her home monitor.

## 2017-07-06 NOTE — Telephone Encounter (Signed)
New message   STAT if HR is under 50 or over 120 (normal HR is 60-100 beats per minute)  1) What is your heart rate? 42   2) Do you have a log of your heart rate readings (document readings)? 35, 32  3) Do you have any other symptoms? Dizziness, nausea   No chest Pain No shortness of breath

## 2017-07-06 NOTE — Patient Instructions (Addendum)
Medication Instructions: Your physician has recommended you make the following change in your medication: 1. DECREASE Diltiazem to 120 mg daily  If you need a refill on your cardiac medications before your next appointment, please call your pharmacy.   Procedures/Testing: Your physician has recommended that you wear an event monitor for a one week. Event monitors are medical devices that record the heart's electrical activity. Doctors most often [place] these monitors to diagnose arrhythmias. Arrhythmias are problems with the speed or rhythm of the heartbeat. The monitor is a small, portable device. You can wear one while you do your normal daily activities. This is usually used to diagnose what is causing palpitations/syncope (passing out).    Follow-Up: Your physician wants you to follow-up in 2 weeks with [name], PA. You will receive a reminder letter in the mail two months in advance. If you don't receive a letter, please call our office at [phone] to schedule this follow-up appointment.   Thank you for choosing [facility] at [facility]!!

## 2017-07-06 NOTE — Assessment & Plan Note (Signed)
Declined Crestor (nausea)-on Zetia

## 2017-07-06 NOTE — Progress Notes (Signed)
07/06/2017 Chloe Perkins   February 25, 1945  [id number]  Primary Physician [name], MD Primary Cardiologist: Dr Claiborne Billings  HPI:  72 y/o female followed by Dr Claiborne Billings with a history of HTN, palpitations, CAD on chest CT, and dyslipidemia, seen today as an add on for slow HR. The pt says her monitor at home shows her HR as low as 30. She denies syncope or near syncope but admits to fatigue. Her slow HR may last 10 minutes according to the patient. She admits she did take her PRN Metoprolol last PM for tachycardia that lasted 10 minutes.    Current Outpatient Medications  Medication Sig Dispense Refill  . buPROPion (WELLBUTRIN XL) 150 MG 24 hr tablet Take 450 mg by mouth daily.    . calcium-vitamin D (OSCAL WITH D) 500-200 MG-UNIT per tablet Take 1 tablet by mouth 2 (two) times daily.      . cyanocobalamin (,VITAMIN B-12,) 1000 MCG/ML injection Inject 1,000 mcg into the muscle every 30 (thirty) days.    Marland Kitchen diltiazem (CARDIZEM CD) 180 MG 24 hr capsule Take 1 capsule (180 mg total) by mouth daily. 90 capsule 3  . ezetimibe (ZETIA) 10 MG tablet Take 1 tablet (10 mg total) by mouth daily. 90 tablet 3  . LORazepam (ATIVAN) 0.5 MG tablet Take 0.5 mg by mouth See admin instructions. TAKE 1 TABLET (0.5 MG) SCHEDULED IN THE MORNING & 1 TABLET (0.5 MG) IN THE EVENING AS NEEDED FOR ANXIETY.  5  . losartan (COZAAR) 100 MG tablet Take 1 tablet (100 mg total) by mouth daily. 90 tablet 3  . meloxicam (MOBIC) 15 MG tablet Take 1 tablet (15 mg total) daily by mouth. 20 tablet 0  . metoprolol tartrate (LOPRESSOR) 25 MG tablet Take 1/2-1 tablet as needed for palpitations 30 tablet 3  . Polyethyl Glycol-Propyl Glycol (SYSTANE) 0.4-0.3 % SOLN Place 1-2 drops into both eyes 3 (three) times daily as needed (for dry eyes.).    Marland Kitchen rosuvastatin (CRESTOR) 10 MG tablet Take 1 tablet (10 mg total) by mouth daily. 90 tablet 3   No current facility-administered medications for this visit.     Allergies  Allergen  Reactions  . Codeine Nausea And Vomiting  . Adhesive [Tape] Rash    PAPER TAPE ONLY (INTOLERANCE TO LEADS--EKG)  . Alendronate Sodium Other (See Comments)    REACTION: pt states INTOL to Fosamax  . Verapamil Other (See Comments)    REACTION: Intol w/ bradycardia    Past Medical History:  Diagnosis Date  . Anxiety and depression   . Arthritis   . Asthmatic bronchitis   . Bronchitis    history of  . Cancer ([place])    MELANOMA  . Depression   . Dysrhythmia   . Heart murmur   . Hemorrhoids   . History of melanoma   . HLD (hyperlipidemia)   . Hypertension   . Mitral valve prolapse   . Osteoporosis   . Personal history of malignant melanoma of skin   . SVT (supraventricular tachycardia) ([place])   . Viral gastroenteritis     Social History   Socioeconomic History  . Marital status: Married    Spouse name: sammy Lupu  . Number of children: 1  . Years of education: Not on file  . Highest education level: Not on file  Occupational History  . Occupation: Retired  Scientific laboratory technician  . Financial resource strain: Not on file  . Food insecurity:    Worry: Not on file  Inability: Not on file  . Transportation needs:    Medical: Not on file    Non-medical: Not on file  Tobacco Use  . Smoking status: Former Smoker    Packs/day: 0.50    Years: 20.00    Pack years: 10.00    Types: Cigarettes    Last attempt to quit: 02/09/1990    Years since quitting: 27.4  . Smokeless tobacco: Never Used  Substance and Sexual Activity  . Alcohol use: No    Alcohol/week: 0.0 oz  . Drug use: No  . Sexual activity: Yes  Lifestyle  . Physical activity:    Days per week: Not on file    Minutes per session: Not on file  . Stress: Not on file  Relationships  . Social connections:    Talks on phone: Not on file    Gets together: Not on file    Attends religious service: Not on file    Active member of club or organization: Not on file    Attends meetings of clubs or organizations: Not on  file    Relationship status: Not on file  . Intimate partner violence:    Fear of current or ex partner: Not on file    Emotionally abused: Not on file    Physically abused: Not on file    Forced sexual activity: Not on file  Other Topics Concern  . Not on file  Social History Narrative   She lives her family, she has one child, retired, no smoke no drink     Family History  Problem Relation Age of Onset  . Emphysema Father        PGF, brother, sister  . COPD Father        PGF, brother, sister  . Heart disease Father   . Rheum arthritis Mother   . Stroke Mother   . Heart disease Mother        Father, brother, sister  . Lung cancer Paternal Grandfather   . Ovarian cancer Paternal Aunt   . Breast cancer Cousin   . Cancer Brother   . Heart disease Brother   . Heart disease Sister      Review of Systems: General: negative for chills, fever, night sweats or weight changes.  Cardiovascular: negative for chest pain, dyspnea on exertion, edema, orthopnea, palpitations, paroxysmal nocturnal dyspnea or shortness of breath Dermatological: negative for rash Respiratory: negative for cough or wheezing Urologic: negative for hematuria Abdominal: negative for nausea, vomiting, diarrhea, bright red blood per rectum, melena, or hematemesis Neurologic: negative for visual changes, syncope, or dizziness All other systems reviewed and are otherwise negative except as noted above.    Blood pressure 138/72, pulse (!) 48, height 5\' 4"  (1.626 m), weight 115 lb 3.2 oz (52.3 kg).  General appearance: alert, cooperative and no distress Neck: no carotid bruit and no JVD Lungs: clear to auscultation bilaterally Heart: regular rate and rhythm and 2/6 systolic murmur AOV, preserved S2 Extremities: extremities normal, atraumatic, no cyanosis or edema Skin: Skin color, texture, turgor normal. No rashes or lesions Neurologic: Grossly normal  EKG NSR, SB 48, low voltage  ASSESSMENT AND PLAN:    Tachycardia-bradycardia syndrome (HCC) Pt c/o palpitations as well as bradycardia- (no symptoms)- noted on her home monitor.  Essential hypertension Controlled  CKD (chronic kidney disease) stage 3, GFR 30-59 ml/min GFR 51  HLD (hyperlipidemia) Declined Crestor (nausea)-on Zetia   PLAN  Decrease Diltiazem back to 120 mg daily (increase LOV for HTN).  They are going out of town for 5 days, will check a 7 day monitor when they return and then f/u as an OP. If her HR is better and she has no significant arrhyhtmia we may need to consider an additional agent (HCTZ) for HTN depending ion her B/P.   [name] PA-C 07/06/2017 3:00 PM

## 2017-07-06 NOTE — Assessment & Plan Note (Signed)
Controlled.  

## 2017-07-06 NOTE — Telephone Encounter (Signed)
Returned call to patient's husband.He stated wife has been having a slow heart rate for the past couple of days ranging 38 to 41.Stated he would like appointment with Dr.Kelly.Stated they are going out of town and he wants to make sure she is ok.Appointment scheduled with [name] PA this afternoon at 2:30 pm.Advised to bring all medications to appointment.

## 2017-07-12 ENCOUNTER — Telehealth: Payer: Self-pay | Admitting: Cardiovascular Disease

## 2017-07-12 NOTE — Telephone Encounter (Signed)
Put her on a list and let  Hayley know. I will be putting myself in the office at least 1 or possible 2 extra days in June and can see her then

## 2017-07-12 NOTE — Telephone Encounter (Signed)
Pt updated and message routed to Dr. Evette Georges nurse.

## 2017-07-12 NOTE — Telephone Encounter (Signed)
Spoke with pt who states she was seen on 07/06/17 by [name], PA due to palpitation and bradycardia. At that time he decreased her diltiazem to 120 mg and recommend to wear a 7 day monitor. Pt reports that since the medication change she has been feeling weak, dizzy, and experiencing a headache. She also report her BP has increased. Pt states she had started back taking 180 mg and has felt better since. She is requesting to see Dr. Claiborne Billings sooner than her appointment on 08/31/17 and doesn't want an appointment with and APP. Pt made aware that unfortunately at this time there were not sooner appointment available. Routing to Dr. Claiborne Billings for further recommendations.

## 2017-07-12 NOTE — Telephone Encounter (Signed)
New Message:       Pt is calling and wanting a sooner appt to see Claiborne Billings so he can adjust some medications for her. Pt does not want to see a APP

## 2017-07-14 ENCOUNTER — Ambulatory Visit (INDEPENDENT_AMBULATORY_CARE_PROVIDER_SITE_OTHER): Payer: Medicare Other | Admitting: *Deleted

## 2017-07-14 DIAGNOSIS — E538 Deficiency of other specified B group vitamins: Secondary | ICD-10-CM | POA: Diagnosis not present

## 2017-07-14 MED ORDER — CYANOCOBALAMIN 1000 MCG/ML IJ SOLN
1000.0000 ug | Freq: Once | INTRAMUSCULAR | Status: AC
  Administered 2017-07-14: 1000 ug via INTRAMUSCULAR

## 2017-07-14 NOTE — Progress Notes (Signed)
Patient presented for B 12 injection to left deltoid, patient voiced no concerns nor showed any signs of distress during injection. 

## 2017-07-16 NOTE — Telephone Encounter (Signed)
Left message to call back  

## 2017-07-26 ENCOUNTER — Ambulatory Visit: Payer: Medicare Other | Admitting: Cardiology

## 2017-07-29 ENCOUNTER — Ambulatory Visit (INDEPENDENT_AMBULATORY_CARE_PROVIDER_SITE_OTHER): Payer: Medicare Other | Admitting: Cardiovascular Disease

## 2017-07-29 ENCOUNTER — Encounter: Payer: Self-pay | Admitting: Cardiovascular Disease

## 2017-07-29 VITALS — BP 148/78 | HR 60 | Ht 64.0 in | Wt 116.4 lb

## 2017-07-29 DIAGNOSIS — F329 Major depressive disorder, single episode, unspecified: Secondary | ICD-10-CM | POA: Diagnosis not present

## 2017-07-29 DIAGNOSIS — F32A Depression, unspecified: Secondary | ICD-10-CM

## 2017-07-29 DIAGNOSIS — F419 Anxiety disorder, unspecified: Secondary | ICD-10-CM | POA: Diagnosis not present

## 2017-07-29 DIAGNOSIS — I10 Essential (primary) hypertension: Secondary | ICD-10-CM | POA: Diagnosis not present

## 2017-07-29 DIAGNOSIS — E782 Mixed hyperlipidemia: Secondary | ICD-10-CM | POA: Diagnosis not present

## 2017-07-29 DIAGNOSIS — I6529 Occlusion and stenosis of unspecified carotid artery: Secondary | ICD-10-CM | POA: Diagnosis not present

## 2017-07-29 DIAGNOSIS — I2510 Atherosclerotic heart disease of native coronary artery without angina pectoris: Secondary | ICD-10-CM | POA: Diagnosis not present

## 2017-07-29 DIAGNOSIS — I495 Sick sinus syndrome: Secondary | ICD-10-CM | POA: Diagnosis not present

## 2017-07-29 MED ORDER — METOPROLOL TARTRATE 25 MG PO TABS: 25.0000 mg | ORAL_TABLET | Freq: Two times a day (BID) | ORAL | 1 refills | Status: DC

## 2017-07-29 MED ORDER — LOSARTAN POTASSIUM 100 MG PO TABS: 100.0000 mg | ORAL_TABLET | Freq: Every day | ORAL | 3 refills | Status: DC

## 2017-07-29 NOTE — Progress Notes (Signed)
Patient ID: Chloe Perkins, female   DOB: 05/17/1945, 72 y.o.   MRN: [id number]    Primary M.D.: Dr. Deborra Perkins  HPI: Chloe Perkins is a 72 y.o. female who presents to the office for a 3 month cardiology follow-up evaluation.  Chloe Perkins has a history of SVT and hypertension, for which she has been on diltiazem at 120 mg  and losartan 50 mg daily.  She has  also remote history of thyroid abnormality and in the past  had taken levothyroxine.  She has mild renal insufficiency with stage III renal disease.  Has a history of anxiety/depression for which she takes Wellbutrin XL 300 mg daily.  She also is on calcium with vitamin D in light of osteoporosis.  Recently, she believes that she has been experiencing more episodes of palpitations which are short-lived and at times occur daily.  She denies associated presyncope or syncope.  She denies chest pain.  She recently underwent a panoramix dental x-ray and was told by her dentist that she may have calcification of her carotids.   She has a history of osteoporosis, as well as vitamin B12 deficiency and has previously been found to have stable solitary pulmonary nodule.  She had a follow-up echo Doppler study in November 2016 which showed an ejection fraction at 60-65%.  She had normal diastolic parameters.  There was turbulent flow at the base of her aortic valve in the region of the right coronary cusp which was most likely the cause of her systolic murmur.    I last saw her, she was experiencing  transient palpitations lasting less than 5 seconds, almost on a daily basis.  She has been using caffeine.  She underwent a carotid duplex study in July 2017 which showed heterogeneous plaque bilaterally and 1-39% bilateral ICA stenoses with normal subclavian arteries, and patent vertebral arteries with antegrade flow.  I saw her, I further titrated her Cardizem and suggested that she take 240 mg at bedtime.  She's continued to take losartan 75 mg in the  morning.  Since I  saw her in February 2018, she states that she has continued to experience short-lived episodes of asymptomatic palpitations which may last anywhere from 5-10 seconds, but seemed to occur on a daily basis.  She has been on slow acting diltiazem at 240 mg daily and has continued to take losartan 75 mg daily for hypertension.  She also is on Ativan and Wellbutrin.   She underwent a CT scan by Dr. Deborra Perkins.  She was found to have a stable 1 cm nodule in the right lower lobe that was unchanged from previously.  Incidentally, she was also noted to have coronary artery calcification suggestive of CAD.  Once I found out about this, I had contacted her and recommended initiation of Crestor at an initial very low dose of 10 mg.  Since she was having almost daily palpitations when I saw her several weeks ago  I also attempted to add very low-dose metoprolol succinate at 12.5 mg.  Her heart rate had dropped into the upper 40s with this and she stopped taking it; however, toprol did improve her daily palpitations.    When I saw her in October 2018 I had a lengthy discussion with her regarding the fact that she has demonstration of subclinical atherosis in the importance of trying to induce plaque regression.  After much discussion, she agreed to initiate low-dose statin therapy.  She was started on low-dose Crestor.  Apparently,  she self discontinued this since she felt this caused her to be nauseated.  On for brief 14 2018.  Repeat laboratory off treatment showed a total cholesterol 2:15, HDL 87, LDL 114, and triglycerides 71.    I last saw her in March 2019 and since she had discontinued Crestor at this cause her to be nauseated I initiated Zetia.  She saw Chloe Perkins on Jul 06, 2017 as an add-on for slow heart rate.  Heart rate on his exam was 48.  He decrease diltiazem back to 120 mg daily plans to check a 7-day monitor upon return from a trip that they were planning to do shortly after his  evaluation.  Apparently, Chloe Perkins did not feel well on the 120 of diltiazem and for this reason went back to take her previous 180 mg dose.  She has noticed her recent pressure elevation and also has noticed a mild headache.  She took the reduced dose it for 5 days and then went back to 180 mg with feeling improved.  She presents for evaluation.  Past Medical History:  Diagnosis Date  . Anxiety and depression   . Arthritis   . Asthmatic bronchitis   . Bronchitis    history of  . Cancer ([place])    MELANOMA  . Depression   . Dysrhythmia   . Heart murmur   . Hemorrhoids   . History of melanoma   . HLD (hyperlipidemia)   . Hypertension   . Mitral valve prolapse   . Osteoporosis   . Personal history of malignant melanoma of skin   . SVT (supraventricular tachycardia) ([place])   . Viral gastroenteritis     Past Surgical History:  Procedure Laterality Date  . ABDOMINAL HYSTERECTOMY  1975  . CATARACT EXTRACTION W/PHACO Right 02/10/2017   Procedure: CATARACT EXTRACTION PHACO AND INTRAOCULAR LENS PLACEMENT (IOC);  Surgeon: Birder Robson, MD;  Location: [facility] ORS;  Service: Ophthalmology;  Laterality: Right;  [place] 00:35.9 AP% 11.5 CDE 4.11 Fluid Pack Lot # [id number]  . CATARACT EXTRACTION W/PHACO Left 03/09/2017   Procedure: CATARACT EXTRACTION PHACO AND INTRAOCULAR LENS PLACEMENT (IOC);  Surgeon: Birder Robson, MD;  Location: [facility] ORS;  Service: Ophthalmology;  Laterality: Left;  [place] 00:23 AP% 16.3 CDE 3.85 Fluid pack lot # [id number]  . COLONOSCOPY  04/15/2004   2006: Normal  . ENDOMETRIAL ABLATION  1976  . left thumb tendon repair  10/09  . MELANOMA EXCISION  1999   superficial spreading melanoma right post shoulder  . NM MYOCAR PERF WALL MOTION  12/28/2008   No ischemia  . SKIN CANCER EXCISION  03/02/2013   superficial basal cell cancer  . US ECHOCARDIOGRAPHY  10/16/2010   mitral valve leaflets midly thickened,trace MR.  . WRIST SURGERY Left 2011    Allergies  Allergen Reactions    . Codeine Nausea And Vomiting  . Adhesive [Tape] Rash    PAPER TAPE ONLY (INTOLERANCE TO LEADS--EKG)  . Alendronate Sodium Other (See Comments)    REACTION: pt states INTOL to Fosamax  . Verapamil Other (See Comments)    REACTION: Intol w/ bradycardia    Current Outpatient Medications  Medication Sig Dispense Refill  . buPROPion (WELLBUTRIN XL) 150 MG 24 hr tablet Take 450 mg by mouth daily.    . calcium-vitamin D (OSCAL WITH D) 500-200 MG-UNIT per tablet Take 1 tablet by mouth 2 (two) times daily.      . cyanocobalamin (,VITAMIN B-12,) 1000 MCG/ML injection Inject 1,000 mcg into the muscle every  30 (thirty) days.    Marland Kitchen ezetimibe (ZETIA) 10 MG tablet Take 1 tablet (10 mg total) by mouth daily. 90 tablet 3  . LORazepam (ATIVAN) 0.5 MG tablet Take 0.5 mg by mouth See admin instructions. TAKE 1 TABLET (0.5 MG) SCHEDULED IN THE MORNING & 1 TABLET (0.5 MG) IN THE EVENING AS NEEDED FOR ANXIETY.  5  . losartan (COZAAR) 100 MG tablet Take 1 tablet (100 mg total) by mouth daily. 90 tablet 3  . meloxicam (MOBIC) 15 MG tablet Take 1 tablet (15 mg total) daily by mouth. 20 tablet 0  . metoprolol tartrate (LOPRESSOR) 25 MG tablet Take 1 tablet (25 mg total) by mouth 2 (two) times daily. 180 tablet 1  . Polyethyl Glycol-Propyl Glycol (SYSTANE) 0.4-0.3 % SOLN Place 1-2 drops into both eyes 3 (three) times daily as needed (for dry eyes.).     No current facility-administered medications for this visit.     Social History   Socioeconomic History  . Marital status: Married    Spouse name: sammy Hernandes  . Number of children: 1  . Years of education: Not on file  . Highest education level: Not on file  Occupational History  . Occupation: Retired  Scientific laboratory technician  . Financial resource strain: Not on file  . Food insecurity:    Worry: Not on file    Inability: Not on file  . Transportation needs:    Medical: Not on file    Non-medical: Not on file  Tobacco Use  . Smoking status: Former Smoker     Packs/day: 0.50    Years: 20.00    Pack years: 10.00    Types: Cigarettes    Last attempt to quit: 02/09/1990    Years since quitting: 27.4  . Smokeless tobacco: Never Used  Substance and Sexual Activity  . Alcohol use: No    Alcohol/week: 0.0 oz  . Drug use: No  . Sexual activity: Yes  Lifestyle  . Physical activity:    Days per week: Not on file    Minutes per session: Not on file  . Stress: Not on file  Relationships  . Social connections:    Talks on phone: Not on file    Gets together: Not on file    Attends religious service: Not on file    Active member of club or organization: Not on file    Attends meetings of clubs or organizations: Not on file    Relationship status: Not on file  . Intimate partner violence:    Fear of current or ex partner: Not on file    Emotionally abused: Not on file    Physically abused: Not on file    Forced sexual activity: Not on file  Other Topics Concern  . Not on file  Social History Narrative   She lives her family, she has one child, retired, no smoke no drink    Family History  Problem Relation Age of Onset  . Emphysema Father        PGF, brother, sister  . COPD Father        PGF, brother, sister  . Heart disease Father   . Rheum arthritis Mother   . Stroke Mother   . Heart disease Mother        Father, brother, sister  . Lung cancer Paternal Grandfather   . Ovarian cancer Paternal Aunt   . Breast cancer Cousin   . Cancer Brother   . Heart disease Brother   .  Heart disease Sister     ROS General: Negative; No fevers, chills, or night sweats;  HEENT: Negative; No changes in vision or hearing, sinus congestion, difficulty swallowing Pulmonary: Stable lung nodule Cardiovascular: Palpitations improved GI: Negative; No nausea, vomiting, diarrhea, or abdominal pain GU: Negative; No dysuria, hematuria, or difficulty voiding Musculoskeletal: she complains of chronic back pain.  Hematologic/Oncology: Negative; no easy  bruising, bleeding Endocrine: Negative; no heat/cold intolerance; no diabetes Neuro: Negative; no changes in balance, headaches Skin: Negative; No rashes or skin lesions Psychiatric: Positive for anxiety/depression on Wellbutrin Sleep: Negative; No snoring, daytime sleepiness, hypersomnolence, bruxism, restless legs, hypnogognic hallucinations, no cataplexy Other comprehensive 14 point system review is negative.   PE BP (!) 148/78   Pulse 60   Ht _0  (1.626 m)   Wt 116 lb 6.4 oz (52.8 kg)   SpO2 96%   BMI 19.98 kg/m    Repeat blood pressure by me was elevated 158/76.  Wt Readings from Last 3 Encounters:  07/29/17 116 lb 6.4 oz (52.8 kg)  07/06/17 115 lb 3.2 oz (52.3 kg)  05/05/17 117 lb (53.1 kg)   General: Alert, oriented, no distress.  Skin: normal turgor, no rashes, warm and dry HEENT: Normocephalic, atraumatic. Pupils equal round and reactive to light; sclera anicteric; extraocular muscles intact;  Nose without nasal septal hypertrophy Mouth/Parynx benign; Mallinpatti scale2 Neck: No JVD, no carotid bruits; normal carotid upstroke Lungs: clear to ausculatation and percussion; no wheezing or rales Chest wall: without tenderness to palpitation Heart: PMI not displaced, RRR, s1 s2 normal, 1/6 systolic murmur, no diastolic murmur, no rubs, gallops, thrills, or heaves Abdomen: soft, nontender; no hepatosplenomehaly, BS+; abdominal aorta nontender and not dilated by palpation. Back: no CVA tenderness Pulses 2+ Musculoskeletal: full range of motion, normal strength, no joint deformities Extremities: no clubbing cyanosis or edema, Homan's sign negative  Neurologic: grossly nonfocal; Cranial nerves grossly wnl Psychologic: Normal mood and affect   ECG (independently read by me): [place] rhythm with alternating sinus and low atrial beat heart rate 60 bpm.  May 05, 2017 ECG (independently read by me): normal sinus rhythm at 62 with a PAC.  Low voltage.  Normal  intervals.  October 2018 ECG (independently read by me): Sinus rhythm at 65 bpm, PAC.  PR interval 128 ms, QTc interval 428 ms.  September 2018 ECG (independently read by me): Normal sinus rhythm at 64 bpm, PA-C, mild RV conduction delay.  Nonspecific ST changes.  February 2017 ECG (independently read by me): Sinus bradycardia 53 bpm..  Mild RV conduction delay.  Normal intervals.  November 2017 ECG (independently read by me): Normal sinus rhythm at 61 bpm.  No ectopy.  Normal intervals.  July 2017 ECG (independently read by me): Sinus rhythm at 60 bpm with PAC.  Normal intervals.  November 2016 ECG (independently read by me): Normal sinus rhythm at 66 bpm.  Mild RV conduction delay.  Nondiagnostic small inferior Q waves.  ECG (independently read by me): sinus bradycardia 50 bpm.  Mild RV conduction delay  November 2015 ECG (independently read by me0;  Normal sinus rhythm at 77 bpm.  RV conduction delay.  Nondiagnostic small Q waves.  Nonspecific ST changes.  April 2015 ECG (independently read by me): Sinus bradycardia 54 beats per minute.  Mild RV conduction delay.  No ectopy.  LABS: BMP Latest Ref Rng & Units 05/05/2017 03/25/2016 03/18/2015  Glucose 65 - 99 mg/dL 98 95 98  BUN 6 - 20 mg/dL _1 Creatinine 0.44 -  1.00 mg/dL 1.07(H) 1.07(H) 1.02  Sodium 135 - 145 mmol/L 139 139 139  Potassium 3.5 - 5.1 mmol/L 4.4 4.2 4.8  Chloride 101 - 111 mmol/L 106 106 103  CO2 22 - 32 mmol/L _0 Calcium 8.9 - 10.3 mg/dL 9.2 9.5 10.4   Hepatic Function Latest Ref Rng & Units 05/05/2017 03/25/2016 12/11/2014  Total Protein 6.5 - 8.1 g/dL 7.3 7.2 6.4  Albumin 3.5 - 5.0 g/dL 4.2 4.4 3.9  AST 15 - 41 U/L _1 ALT 14 - 54 U/L 13(L) 14 15  Alk Phosphatase 38 - 126 U/L 64 73 80  Total Bilirubin 0.3 - 1.2 mg/dL 0.6 0.6 0.5  Bilirubin, Direct 0.0 - 0.3 mg/dL - - -   CBC Latest Ref Rng & Units 03/25/2016 12/11/2014 11/28/2014  WBC 3.6 - 11.0 K/uL 3.4(L) 5.0 3.3  Hemoglobin 12.0 - 16.0 g/dL  13.8 14.3 14.3  Hematocrit 35.0 - 47.0 % 40.5 42.5 41  Platelets 150 - 440 K/uL 170 229 202   Lab Results  Component Value Date   MCV 95.1 03/25/2016   MCV 95.5 12/11/2014   MCV 94.7 05/04/2013   Lab Results  Component Value Date   TSH 5.358 (H) 03/25/2016  No results found for: HGBA1C  Lipid Panel     Component Value Date/Time   CHOL 208 (H) 05/05/2017 0743   CHOL 203 (H) 05/22/2011 0759   TRIG 51 05/05/2017 0743   TRIG 65 05/22/2011 0759   HDL 80 05/05/2017 0743   HDL 88 (H) 05/22/2011 0759   CHOLHDL 2.6 05/05/2017 0743   VLDL 10 05/05/2017 0743   VLDL 13 05/22/2011 0759   LDLCALC 118 (H) 05/05/2017 0743   LDLCALC 102 (H) 05/22/2011 0759   LDLDIRECT 102.2 04/23/2006 1211     RADIOLOGY: No results found.  IMPRESSION:  1. 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 Mixed hyperlipidemia   4. Coronary artery calcification seen on CT scan   5. Carotid artery plaque, unspecified laterality   6. Anxiety and depression     ASSESSMENT AND PLAN: Chloe Perkins is a 72 year old female who has a history of SVT as well as hypertension. Remotely  she had experiencing more frequent palpitations which led to further dose escalation of Cardizem up to 240 mg with improvement.  I last saw her, her blood pressure was elevated and I further recommended titration of losartan up to 100 mg daily.  She did not do well on the reduced dose of diltiazem which had been lowered due to reports of slow heart rate.  Her blood pressure today is elevated again at 1 75-7 58 systolically.  She continues to be on losartan 100 mg daily.  I have recommended she discontinue diltiazem since she has not felt well on this.  I will change her to metoprolol tartrate 25 mg twice a day and depending upon her heart rate and blood pressure further titration may be necessary.  She continues to tolerate Zetia 10 mg for hyperlipidemia in light of her documentation of subclinical  atherosclerosis.  She takes Wellbutrin for anxiety/depression.  Has osteoporosis and continues to take calcium with vitamin D.  I will see her in 1 month for reevaluation at which further medication adjustment may be necessary.     I am spent: 25 minutes  Troy Sine, MD, [facility]  07/31/2017 9:43 AM

## 2017-07-29 NOTE — Patient Instructions (Signed)
Medication Instructions: STOP the Diltiazem START Metoprolol 25 mg twice daily.   -For the next two days take Metoprolol 12.5 mg twice daily  -On the third day take the 25 mg twice daily and from then on.  If you need a refill on your cardiac medications before your next appointment, please call your pharmacy.   Follow-Up: Your physician wants you to follow-up on July 23 rd with Dr. Claiborne Billings.   Thank you for choosing [facility]!!

## 2017-07-31 ENCOUNTER — Encounter: Payer: Self-pay | Admitting: Cardiovascular Disease

## 2017-08-10 ENCOUNTER — Telehealth: Payer: Self-pay | Admitting: Internal Medicine

## 2017-08-10 NOTE — Telephone Encounter (Signed)
Lab changed.

## 2017-08-10 NOTE — Telephone Encounter (Signed)
Copied from [place] [id number]. Topic: Appointment Scheduling - Scheduling Inquiry for Clinic >> Aug 10, 2017 11:50 AM Cecelia Byars, NT wrote: Reason for CRM: Patient called and would like to know if she can change her labs from wed  08/18/17 08/17/17 to tue before 930 she has an appointment that day at 1000 please advise [phone] ok to leave message

## 2017-08-11 ENCOUNTER — Telehealth: Payer: Self-pay | Admitting: Cardiovascular Disease

## 2017-08-11 DIAGNOSIS — I495 Sick sinus syndrome: Secondary | ICD-10-CM

## 2017-08-11 MED ORDER — AMLODIPINE BESYLATE 5 MG PO TABS: 5.0000 mg | ORAL_TABLET | Freq: Every day | ORAL | 5 refills | Status: DC

## 2017-08-11 NOTE — Telephone Encounter (Signed)
Spoke with patient earlier this morning. She has been having issues with her blood pressure being up and heart rate low. Since starting the Metoprolol 25 mg twice a day, last few days of blood pressures , 163/69, 143/69, 140/68, 164/75, and 150/69. Heart rates have been running from 35-43. Did ask patient to take her blood pressure then and was 173/81 HR 43. Did offer appointment with PA today but patient does NOT want to see PA/NP, Dr Claiborne Billings out of the office. Discussed with Dr Ellyn Hack DOD and he recommended stopping the Metoprolol and starting Amlodipine 5 mg daily, ok to use Metoprolol 25 mg 1/2 tablet as needed for fast HR. Also recommended to proceed with 7 day event monitor as ordered by [facility]. She had requested a second opinion from Dr Johnsie Cancel who sees a sibling. Did reach out to Dr Johnsie Cancel who suggested patient see EP instead, referral placed in Epic

## 2017-08-11 NOTE — Telephone Encounter (Signed)
New message    Patient calling with concerns about HR.   STAT if HR is under 50 or over 120 (normal HR is 60-100 beats per minute)  1) What is your heart rate? 43 BP 164/75  2) Do you have a log of your heart rate readings (document readings)? 40  3) Do you have any other symptoms? Patient states she is unable to explain how she feels

## 2017-08-13 ENCOUNTER — Ambulatory Visit: Payer: Medicare Other | Admitting: Cardiovascular Disease

## 2017-08-16 ENCOUNTER — Telehealth: Payer: Self-pay | Admitting: Radiology

## 2017-08-16 DIAGNOSIS — N183 Chronic kidney disease, stage 3 unspecified: Secondary | ICD-10-CM

## 2017-08-16 DIAGNOSIS — E039 Hypothyroidism, unspecified: Secondary | ICD-10-CM

## 2017-08-16 DIAGNOSIS — E782 Mixed hyperlipidemia: Secondary | ICD-10-CM

## 2017-08-16 NOTE — Addendum Note (Signed)
Addended by: [name] on: 08/16/2017 10:32 AM   Modules accepted: Orders

## 2017-08-16 NOTE — Telephone Encounter (Signed)
I have reordered the labs her cardiologist wanted to have done at this time, along with a TSH.

## 2017-08-16 NOTE — Telephone Encounter (Signed)
Pt coming in for labs tomorrow, please place future orders. Thanks

## 2017-08-17 ENCOUNTER — Other Ambulatory Visit: Payer: Medicare Other

## 2017-08-17 ENCOUNTER — Ambulatory Visit (INDEPENDENT_AMBULATORY_CARE_PROVIDER_SITE_OTHER): Payer: Medicare Other | Admitting: *Deleted

## 2017-08-17 DIAGNOSIS — E538 Deficiency of other specified B group vitamins: Secondary | ICD-10-CM

## 2017-08-17 MED ORDER — CYANOCOBALAMIN 1000 MCG/ML IJ SOLN
1000.0000 ug | Freq: Once | INTRAMUSCULAR | Status: AC
  Administered 2017-08-17: 1000 ug via INTRAMUSCULAR

## 2017-08-17 NOTE — Progress Notes (Signed)
Patient presented for B 12 injection to right deltoid, patient voiced no concerns nor showed any signs of distress during injection. 

## 2017-08-18 ENCOUNTER — Ambulatory Visit: Payer: Medicare Other

## 2017-08-24 ENCOUNTER — Ambulatory Visit (INDEPENDENT_AMBULATORY_CARE_PROVIDER_SITE_OTHER): Payer: Medicare Other

## 2017-08-24 DIAGNOSIS — I495 Sick sinus syndrome: Secondary | ICD-10-CM

## 2017-08-31 ENCOUNTER — Ambulatory Visit: Payer: Medicare Other | Admitting: Cardiovascular Disease

## 2017-08-31 ENCOUNTER — Encounter: Payer: Self-pay | Admitting: Cardiovascular Disease

## 2017-08-31 ENCOUNTER — Ambulatory Visit (INDEPENDENT_AMBULATORY_CARE_PROVIDER_SITE_OTHER): Payer: Medicare Other | Admitting: Cardiovascular Disease

## 2017-08-31 VITALS — BP 134/70 | HR 80 | Ht 63.0 in | Wt 113.6 lb

## 2017-08-31 DIAGNOSIS — I471 Supraventricular tachycardia: Secondary | ICD-10-CM | POA: Diagnosis not present

## 2017-08-31 DIAGNOSIS — I495 Sick sinus syndrome: Secondary | ICD-10-CM | POA: Diagnosis not present

## 2017-08-31 DIAGNOSIS — I10 Essential (primary) hypertension: Secondary | ICD-10-CM | POA: Diagnosis not present

## 2017-08-31 DIAGNOSIS — E785 Hyperlipidemia, unspecified: Secondary | ICD-10-CM

## 2017-08-31 DIAGNOSIS — I6529 Occlusion and stenosis of unspecified carotid artery: Secondary | ICD-10-CM

## 2017-08-31 DIAGNOSIS — I2510 Atherosclerotic heart disease of native coronary artery without angina pectoris: Secondary | ICD-10-CM | POA: Diagnosis not present

## 2017-08-31 DIAGNOSIS — F329 Major depressive disorder, single episode, unspecified: Secondary | ICD-10-CM

## 2017-08-31 DIAGNOSIS — F419 Anxiety disorder, unspecified: Secondary | ICD-10-CM

## 2017-08-31 DIAGNOSIS — F32A Depression, unspecified: Secondary | ICD-10-CM

## 2017-08-31 MED ORDER — LOSARTAN POTASSIUM 100 MG PO TABS: 100.0000 mg | ORAL_TABLET | Freq: Every day | ORAL | 3 refills | Status: DC

## 2017-08-31 MED ORDER — AMLODIPINE BESYLATE 5 MG PO TABS: 5.0000 mg | ORAL_TABLET | Freq: Every day | ORAL | 3 refills | Status: DC

## 2017-08-31 NOTE — Progress Notes (Signed)
Patient ID: Chloe Perkins, female   DOB: 13-Aug-1945, 72 y.o.   MRN: [id number]    Primary M.D.: Dr. Deborra Medina  HPI: Chloe Perkins is a 72 y.o. female who presents to the office for a one month cardiology follow-up evaluation.  Chloe Perkins has a history of SVT and hypertension, for which she has been on diltiazem at 120 mg  and losartan 50 mg daily.  She has  also remote history of thyroid abnormality and in the past  had taken levothyroxine.  She has mild renal insufficiency with stage III renal disease.  Has a history of anxiety/depression for which she takes Wellbutrin XL 300 mg daily.  She also is on calcium with vitamin D in light of osteoporosis.  Recently, she believes that she has been experiencing more episodes of palpitations which are short-lived and at times occur daily.  She denies associated presyncope or syncope.  She denies chest pain.  She recently underwent a panoramix dental x-ray and was told by her dentist that she may have calcification of her carotids.   She has a history of osteoporosis, as well as vitamin B12 deficiency and has previously been found to have stable solitary pulmonary nodule.  She had a follow-up echo Doppler study in November 2016 which showed an ejection fraction at 60-65%.  She had normal diastolic parameters.  There was turbulent flow at the base of her aortic valve in the region of the right coronary cusp which was most likely the cause of her systolic murmur.    I last saw her, she was experiencing  transient palpitations lasting less than 5 seconds, almost on a daily basis.  She has been using caffeine.  She underwent a carotid duplex study in July 2017 which showed heterogeneous plaque bilaterally and 1-39% bilateral ICA stenoses with normal subclavian arteries, and patent vertebral arteries with antegrade flow.  I saw her, I further titrated her Cardizem and suggested that she take 240 mg at bedtime.  She's continued to take losartan 75 mg in  the morning.  Since I  saw her in February 2018, she states that she has continued to experience short-lived episodes of asymptomatic palpitations which may last anywhere from 5-10 seconds, but seemed to occur on a daily basis.  She has been on slow acting diltiazem at 240 mg daily and has continued to take losartan 75 mg daily for hypertension.  She also is on Ativan and Wellbutrin.   She underwent a CT scan by Dr. Deborra Medina.  She was found to have a stable 1 cm nodule in the right lower lobe that was unchanged from previously.  Incidentally, she was also noted to have coronary artery calcification suggestive of CAD.  Once I found out about this, I had contacted her and recommended initiation of Crestor at an initial very low dose of 10 mg.  Since she was having almost daily palpitations when I saw her several weeks ago  I also attempted to add very low-dose metoprolol succinate at 12.5 mg.  Her heart rate had dropped into the upper 40s with this and she stopped taking it; however, toprol did improve her daily palpitations.    When I saw her in October 2018 I had a lengthy discussion with her regarding the fact that she has demonstration of subclinical atherosis in the importance of trying to induce plaque regression.  After much discussion, she agreed to initiate low-dose statin therapy.  She was started on low-dose Crestor.  Apparently,  she self discontinued this since she felt this caused her to be nauseated.  On for brief 14 2018.  Repeat laboratory off treatment showed a total cholesterol 2:15, HDL 87, LDL 114, and triglycerides 71.    When I saw her in March 2019 she had discontinued Crestor because of feeling nauseated which she attributed to Crestor and I initiated Zetia.  She saw [name] on Jul 06, 2017 as an add-on for slow heart rate.  Heart rate on his exam was 48.  He decreased diltiazem back to 120 mg daily   Apparently, Chloe Perkins did not feel well on the 120 of diltiazem and for this  reason went back to take her previous 180 mg dose.  She had noticed her recent pressure elevation and also had noticed a mild headache.  She took the reduced dose ifor 5 days and then went back to 180 mg with feeling improved.     When I saw her on July 29, 2017 for follow-up her blood pressure was elevated on losartan 100 mg daily.  At that time I suggested she discontinue diltiazem and changed her to metoprolol tartrate 25 mg twice a day depending upon her heart rate.  Her ECG during that evaluation showed a bigeminal rhythm with alternating sinus and low atrial beats.  She was tolerating Zetia for hyperlipidemia in light of her documentation of subclinical atherosclerosis.  Apparently, she had called the office and was having issues with elevated blood pressure and slow heart rate.  During my absence, her metoprolol was stopped and she was started on amlodipine 5 mg daily and to use the metoprolol 12.5 mg on an as-needed basis.  She apparently wore a  Monitor for 5 days and apparently mail this back yesterday.  These results are pending.  Apparently she  was scheduled to see Dr. Caryl Comes and will be seeing him later this week presently, she denies any chest pain.  She is unaware of any recurrent episodes of tachycardia.  She denies presyncope or syncope.  She presents for reevaluation.   Past Medical History:  Diagnosis Date  . Anxiety and depression   . Arthritis   . Asthmatic bronchitis   . Bronchitis    history of  . Cancer ([place])    MELANOMA  . Depression   . Dysrhythmia   . Heart murmur   . Hemorrhoids   . History of melanoma   . HLD (hyperlipidemia)   . Hypertension   . Mitral valve prolapse   . Osteoporosis   . Personal history of malignant melanoma of skin   . SVT (supraventricular tachycardia) ([place])   . Viral gastroenteritis     Past Surgical History:  Procedure Laterality Date  . ABDOMINAL HYSTERECTOMY  1975  . CATARACT EXTRACTION W/PHACO Right 02/10/2017   Procedure: CATARACT  EXTRACTION PHACO AND INTRAOCULAR LENS PLACEMENT (IOC);  Surgeon: Birder Robson, MD;  Location: [facility] ORS;  Service: Ophthalmology;  Laterality: Right;  [place] 00:35.9 AP% 11.5 CDE 4.11 Fluid Pack Lot # [id number]  . CATARACT EXTRACTION W/PHACO Left 03/09/2017   Procedure: CATARACT EXTRACTION PHACO AND INTRAOCULAR LENS PLACEMENT (IOC);  Surgeon: Birder Robson, MD;  Location: [facility] ORS;  Service: Ophthalmology;  Laterality: Left;  [place] 00:23 AP% 16.3 CDE 3.85 Fluid pack lot # [id number]  . COLONOSCOPY  04/15/2004   2006: Normal  . ENDOMETRIAL ABLATION  1976  . left thumb tendon repair  10/09  . MELANOMA EXCISION  1999   superficial spreading melanoma right post shoulder  .  NM MYOCAR PERF WALL MOTION  12/28/2008   No ischemia  . SKIN CANCER EXCISION  03/02/2013   superficial basal cell cancer  . US ECHOCARDIOGRAPHY  10/16/2010   mitral valve leaflets midly thickened,trace MR.  . WRIST SURGERY Left 2011    Allergies  Allergen Reactions  . Codeine Nausea And Vomiting  . Adhesive [Tape] Rash    PAPER TAPE ONLY (INTOLERANCE TO LEADS--EKG)  . Alendronate Sodium Other (See Comments)    REACTION: pt states INTOL to Fosamax  . Verapamil Other (See Comments)    REACTION: Intol w/ bradycardia    Current Outpatient Medications  Medication Sig Dispense Refill  . amLODipine (NORVASC) 5 MG tablet Take 1 tablet (5 mg total) by mouth daily. 90 tablet 3  . buPROPion (WELLBUTRIN XL) 150 MG 24 hr tablet Take 450 mg by mouth daily.    . calcium-vitamin D (OSCAL WITH D) 500-200 MG-UNIT per tablet Take 1 tablet by mouth 2 (two) times daily.      . cyanocobalamin (,VITAMIN B-12,) 1000 MCG/ML injection Inject 1,000 mcg into the muscle every 30 (thirty) days.    Marland Kitchen ezetimibe (ZETIA) 10 MG tablet Take 10 mg by mouth daily.    Marland Kitchen LORazepam (ATIVAN) 0.5 MG tablet Take 0.5 mg by mouth See admin instructions. TAKE 1 TABLET (0.5 MG) SCHEDULED IN THE MORNING & 1 TABLET (0.5 MG) IN THE EVENING AS NEEDED FOR ANXIETY.  5  .  losartan (COZAAR) 100 MG tablet Take 1 tablet (100 mg total) by mouth daily. 90 tablet 3  . Polyethyl Glycol-Propyl Glycol (SYSTANE) 0.4-0.3 % SOLN Place 1-2 drops into both eyes 3 (three) times daily as needed (for dry eyes.).     No current facility-administered medications for this visit.     Social History   Socioeconomic History  . Marital status: Married    Spouse name: sammy Garno  . Number of children: 1  . Years of education: Not on file  . Highest education level: Not on file  Occupational History  . Occupation: Retired  Scientific laboratory technician  . Financial resource strain: Not on file  . Food insecurity:    Worry: Not on file    Inability: Not on file  . Transportation needs:    Medical: Not on file    Non-medical: Not on file  Tobacco Use  . Smoking status: Former Smoker    Packs/day: 0.50    Years: 20.00    Pack years: 10.00    Types: Cigarettes    Last attempt to quit: 02/09/1990    Years since quitting: 27.5  . Smokeless tobacco: Never Used  Substance and Sexual Activity  . Alcohol use: No    Alcohol/week: 0.0 oz  . Drug use: No  . Sexual activity: Yes  Lifestyle  . Physical activity:    Days per week: Not on file    Minutes per session: Not on file  . Stress: Not on file  Relationships  . Social connections:    Talks on phone: Not on file    Gets together: Not on file    Attends religious service: Not on file    Active member of club or organization: Not on file    Attends meetings of clubs or organizations: Not on file    Relationship status: Not on file  . Intimate partner violence:    Fear of current or ex partner: Not on file    Emotionally abused: Not on file    Physically abused: Not  on file    Forced sexual activity: Not on file  Other Topics Concern  . Not on file  Social History Narrative   She lives her family, she has one child, retired, no smoke no drink    Family History  Problem Relation Age of Onset  . Emphysema Father        PGF,  brother, sister  . COPD Father        PGF, brother, sister  . Heart disease Father   . Rheum arthritis Mother   . Stroke Mother   . Heart disease Mother        Father, brother, sister  . Lung cancer Paternal Grandfather   . Ovarian cancer Paternal Aunt   . Breast cancer Cousin   . Cancer Brother   . Heart disease Brother   . Heart disease Sister     ROS General: Negative; No fevers, chills, or night sweats;  HEENT: Negative; No changes in vision or hearing, sinus congestion, difficulty swallowing Pulmonary: Stable lung nodule Cardiovascular: see HPI GI: Negative; No nausea, vomiting, diarrhea, or abdominal pain GU: Negative; No dysuria, hematuria, or difficulty voiding Musculoskeletal: she complains of chronic back pain.  Hematologic/Oncology: Negative; no easy bruising, bleeding Endocrine: Negative; no heat/cold intolerance; no diabetes Neuro: Negative; no changes in balance, headaches Skin: Negative; No rashes or skin lesions Psychiatric: Positive for anxiety/depression on Wellbutrin Sleep: Negative; No snoring, daytime sleepiness, hypersomnolence, bruxism, restless legs, hypnogognic hallucinations, no cataplexy Other comprehensive 14 point system review is negative.   PE BP 134/70   Pulse 80   Ht 5' 3"  (1.6 m)   Wt 113 lb 9.6 oz (51.5 kg)   BMI 20.12 kg/m    Repeat blood pressure by me was 136/74.  Wt Readings from Last 3 Encounters:  08/31/17 113 lb 9.6 oz (51.5 kg)  07/29/17 116 lb 6.4 oz (52.8 kg)  07/06/17 115 lb 3.2 oz (52.3 kg)    General: Alert, oriented, no distress.  Skin: normal turgor, no rashes, warm and dry HEENT: Normocephalic, atraumatic. Pupils equal round and reactive to light; sclera anicteric; extraocular muscles intact;  Nose without nasal septal hypertrophy Mouth/Parynx benign; Mallinpatti scale 2 Neck: No JVD, no carotid bruits; normal carotid upstroke Lungs: clear to ausculatation and percussion; no wheezing or rales Chest wall:  without tenderness to palpitation Heart: PMI not displaced, RRR, s1 s2 normal, 1/6 systolic murmur, no diastolic murmur, no rubs, gallops, thrills, or heaves Abdomen: soft, nontender; no hepatosplenomehaly, BS+; abdominal aorta nontender and not dilated by palpation. Back: no CVA tenderness Pulses 2+ Musculoskeletal: full range of motion, normal strength, no joint deformities Extremities: no clubbing cyanosis or edema, Homan's sign negative  Neurologic: grossly nonfocal; Cranial nerves grossly wnl Psychologic: Normal mood and affect   ECG (independently read by me): Normal sinus rhythm 80 bpm.  No ectopy.  Mild RV conduction delay.  Normal intervals.  July 29, 2017 ECG (independently read by me): Bigeminy rhythm with alternating sinus and low atrial beat heart rate 60 bpm.  May 05, 2017 ECG (independently read by me): normal sinus rhythm at 62 with a PAC.  Low voltage.  Normal intervals.  October 2018 ECG (independently read by me): Sinus rhythm at 65 bpm, PAC.  PR interval 128 ms, QTc interval 428 ms.  September 2018 ECG (independently read by me): Normal sinus rhythm at 64 bpm, PA-C, mild RV conduction delay.  Nonspecific ST changes.  February 2017 ECG (independently read by me): Sinus bradycardia 53 bpm..  Mild  RV conduction delay.  Normal intervals.  November 2017 ECG (independently read by me): Normal sinus rhythm at 61 bpm.  No ectopy.  Normal intervals.  July 2017 ECG (independently read by me): Sinus rhythm at 60 bpm with PAC.  Normal intervals.  November 2016 ECG (independently read by me): Normal sinus rhythm at 66 bpm.  Mild RV conduction delay.  Nondiagnostic small inferior Q waves.  ECG (independently read by me): sinus bradycardia 50 bpm.  Mild RV conduction delay  November 2015 ECG (independently read by me0;  Normal sinus rhythm at 77 bpm.  RV conduction delay.  Nondiagnostic small Q waves.  Nonspecific ST changes.  April 2015 ECG (independently read by me): Sinus  bradycardia 54 beats per minute.  Mild RV conduction delay.  No ectopy.  LABS: BMP Latest Ref Rng & Units 05/05/2017 03/25/2016 03/18/2015  Glucose 65 - 99 mg/dL 98 95 98  BUN 6 - 20 mg/dL 17 13 16   Creatinine 0.44 - 1.00 mg/dL 1.07(H) 1.07(H) 1.02  Sodium 135 - 145 mmol/L 139 139 139  Potassium 3.5 - 5.1 mmol/L 4.4 4.2 4.8  Chloride 101 - 111 mmol/L 106 106 103  CO2 22 - 32 mmol/L 26 27 29   Calcium 8.9 - 10.3 mg/dL 9.2 9.5 10.4   Hepatic Function Latest Ref Rng & Units 05/05/2017 03/25/2016 12/11/2014  Total Protein 6.5 - 8.1 g/dL 7.3 7.2 6.4  Albumin 3.5 - 5.0 g/dL 4.2 4.4 3.9  AST 15 - 41 U/L 21 23 19   ALT 14 - 54 U/L 13(L) 14 15  Alk Phosphatase 38 - 126 U/L 64 73 80  Total Bilirubin 0.3 - 1.2 mg/dL 0.6 0.6 0.5  Bilirubin, Direct 0.0 - 0.3 mg/dL - - -   CBC Latest Ref Rng & Units 03/25/2016 12/11/2014 11/28/2014  WBC 3.6 - 11.0 K/uL 3.4(L) 5.0 3.3  Hemoglobin 12.0 - 16.0 g/dL 13.8 14.3 14.3  Hematocrit 35.0 - 47.0 % 40.5 42.5 41  Platelets 150 - 440 K/uL 170 229 202   Lab Results  Component Value Date   MCV 95.1 03/25/2016   MCV 95.5 12/11/2014   MCV 94.7 05/04/2013   Lab Results  Component Value Date   TSH 5.358 (H) 03/25/2016  No results found for: HGBA1C  Lipid Panel     Component Value Date/Time   CHOL 208 (H) 05/05/2017 0743   CHOL 203 (H) 05/22/2011 0759   TRIG 51 05/05/2017 0743   TRIG 65 05/22/2011 0759   HDL 80 05/05/2017 0743   HDL 88 (H) 05/22/2011 0759   CHOLHDL 2.6 05/05/2017 0743   VLDL 10 05/05/2017 0743   VLDL 13 05/22/2011 0759   LDLCALC 118 (H) 05/05/2017 0743   LDLCALC 102 (H) 05/22/2011 0759   LDLDIRECT 102.2 04/23/2006 1211     RADIOLOGY: No results found.  IMPRESSION:  1. SVT (supraventricular tachycardia) ([place])   2. Essential hypertension   3. Tachycardia-bradycardia syndrome ([place])   4. Coronary artery calcification seen on CT scan   5. Hyperlipidemia with target LDL less than 70   6. Anxiety and depression     ASSESSMENT AND  PLAN: Chloe Perkins is a 72 year old female who has a history of SVT as well as hypertension. Remotely  she had experiencing more frequent palpitations which led to further dose escalation of Cardizem up to 240 mg with improvement in due to continued blood pressure issues losartan was titrated up to 100 mg.  She had developed bradycardia and an attempt was made to reduce  diltiazem but she felt this contributed to her increased blood pressure and did not feel well.  When last seen a trial of low-dose metoprolol was initiated and subsequently this has been discontinued.  Her blood pressure today is improved with initiation of amlodipine 5 mg in addition to her losartan 100 mg.  She is no longer bradycardic.  She is unaware of any breakthrough tachydysrhythmia.  She will be seeing Dr. Caryl Comes later this week for reassessment.  An event monitor for 7 days was ordered but she turned this and after 5 days and mail this yesterday; results pending.  She is tolerating the addition of Zetia 10 mg for hyperlipidemia in light of her subclinical atherosclerosis.  Amlodipine.  She continues to take Wellbutrin for anxiety/depression.  She is on calcium with vitamin D for osteoporosis.  I will contact her when her monitor results are available.  I will see her in 6 months for reevaluation.   Time spent: 25 minutes  Troy Sine, MD, [facility]  09/02/2017 4:43 PM

## 2017-08-31 NOTE — Patient Instructions (Signed)

## 2017-09-02 ENCOUNTER — Encounter: Payer: Self-pay | Admitting: Cardiovascular Disease

## 2017-09-03 ENCOUNTER — Ambulatory Visit (INDEPENDENT_AMBULATORY_CARE_PROVIDER_SITE_OTHER): Payer: Medicare Other | Admitting: Internal Medicine

## 2017-09-03 VITALS — BP 140/72 | HR 73 | Ht 63.0 in | Wt 113.0 lb

## 2017-09-03 DIAGNOSIS — I495 Sick sinus syndrome: Secondary | ICD-10-CM | POA: Diagnosis not present

## 2017-09-03 DIAGNOSIS — I10 Essential (primary) hypertension: Secondary | ICD-10-CM | POA: Diagnosis not present

## 2017-09-03 NOTE — Progress Notes (Signed)
ELECTROPHYSIOLOGY CONSULT NOTE  Patient ID: Chloe Perkins, MRN: [id number], DOB/AGE: March 10, 1945 72 y.o. Admit date: (Not on file) Date of Consult: 09/03/2017  Primary Physician: [name], MD Primary Cardiologist: Leda Gauze    Chloe Perkins is a 72 y.o. female who is being seen today for the evaluation of palps and bradycardia at the request of TK all she had a last week.    HPI Chloe Perkins is a 72 y.o. female referred because of palpitations and bradycardia.  The palpitations go back many many years and the bradycardia was more recently identified.   Cardizem was initiated for the PACs was complicated by significant bradycardia with heart rates in the 30s and 40s.  A 14-day patch monitor 7/19 was personally reviewed and demonstrated nocturnal modest bradycardia.  ECG 6/19 demonstrated sinus rhythm at 43 bpm; at that time diltiazem was down titrated 180--120 and then subsequently discontinued.  Patch monitor was applied hereafter.  One symptomatic event demonstrated PAC  DATE TEST EF   11/16 Echo   60-65 %         She is feeling much better since the discontinuation of the diltiazem and use of amlodipine.  Denies chest pain shortness of breath presyncope or syncope.  Past Medical History:  Diagnosis Date  . Anxiety and depression   . Arthritis   . Asthmatic bronchitis   . Bronchitis    history of  . Cancer ([place])    MELANOMA  . Depression   . Dysrhythmia   . Heart murmur   . Hemorrhoids   . History of melanoma   . HLD (hyperlipidemia)   . Hypertension   . Mitral valve prolapse   . Osteoporosis   . Personal history of malignant melanoma of skin   . SVT (supraventricular tachycardia) ([place])   . Viral gastroenteritis       Surgical History:  Past Surgical History:  Procedure Laterality Date  . ABDOMINAL HYSTERECTOMY  1975  . CATARACT EXTRACTION W/PHACO Right 02/10/2017   Procedure: CATARACT EXTRACTION PHACO AND INTRAOCULAR LENS PLACEMENT (IOC);   Surgeon: Birder Robson, MD;  Location: [facility] ORS;  Service: Ophthalmology;  Laterality: Right;  [place] 00:35.9 AP% 11.5 CDE 4.11 Fluid Pack Lot # [id number]  . CATARACT EXTRACTION W/PHACO Left 03/09/2017   Procedure: CATARACT EXTRACTION PHACO AND INTRAOCULAR LENS PLACEMENT (IOC);  Surgeon: Birder Robson, MD;  Location: [facility] ORS;  Service: Ophthalmology;  Laterality: Left;  [place] 00:23 AP% 16.3 CDE 3.85 Fluid pack lot # [id number]  . COLONOSCOPY  04/15/2004   2006: Normal  . ENDOMETRIAL ABLATION  1976  . left thumb tendon repair  10/09  . MELANOMA EXCISION  1999   superficial spreading melanoma right post shoulder  . NM MYOCAR PERF WALL MOTION  12/28/2008   No ischemia  . SKIN CANCER EXCISION  03/02/2013   superficial basal cell cancer  . US ECHOCARDIOGRAPHY  10/16/2010   mitral valve leaflets midly thickened,trace MR.  . WRIST SURGERY Left 2011     Home Meds: Prior to Admission medications   Medication Sig Start Date End Date Taking? Authorizing Provider  amLODipine (NORVASC) 5 MG tablet Take 1 tablet (5 mg total) by mouth daily. 08/31/17 11/29/17  Troy Sine, MD  buPROPion (WELLBUTRIN XL) 150 MG 24 hr tablet Take 450 mg by mouth daily.    [provider]  calcium-vitamin D (OSCAL WITH D) 500-200 MG-UNIT per tablet Take 1 tablet by mouth 2 (two) times daily.  [provider]  cyanocobalamin (,VITAMIN B-12,) 1000 MCG/ML injection Inject 1,000 mcg into the muscle every 30 (thirty) days.    [provider]  ezetimibe (ZETIA) 10 MG tablet Take 10 mg by mouth daily.    [provider]  LORazepam (ATIVAN) 0.5 MG tablet Take 0.5 mg by mouth See admin instructions. TAKE 1 TABLET (0.5 MG) SCHEDULED IN THE MORNING & 1 TABLET (0.5 MG) IN THE EVENING AS NEEDED FOR ANXIETY. 01/20/17   [provider]  losartan (COZAAR) 100 MG tablet Take 1 tablet (100 mg total) by mouth daily. 08/31/17   Troy Sine, MD  Polyethyl Glycol-Propyl Glycol (SYSTANE)  0.4-0.3 % SOLN Place 1-2 drops into both eyes 3 (three) times daily as needed (for dry eyes.).    [provider]    Allergies:  Allergies  Allergen Reactions  . Codeine Nausea And Vomiting  . Adhesive [Tape] Rash    PAPER TAPE ONLY (INTOLERANCE TO LEADS--EKG)  . Alendronate Sodium Other (See Comments)    REACTION: pt states INTOL to Fosamax  . Verapamil Other (See Comments)    REACTION: Intol w/ bradycardia    Social History   Socioeconomic History  . Marital status: Married    Spouse name: sammy Pennix  . Number of children: 1  . Years of education: Not on file  . Highest education level: Not on file  Occupational History  . Occupation: Retired  Scientific laboratory technician  . Financial resource strain: Not on file  . Food insecurity:    Worry: Not on file    Inability: Not on file  . Transportation needs:    Medical: Not on file    Non-medical: Not on file  Tobacco Use  . Smoking status: Former Smoker    Packs/day: 0.50    Years: 20.00    Pack years: 10.00    Types: Cigarettes    Last attempt to quit: 02/09/1990    Years since quitting: 27.5  . Smokeless tobacco: Never Used  Substance and Sexual Activity  . Alcohol use: No    Alcohol/week: 0.0 oz  . Drug use: No  . Sexual activity: Yes  Lifestyle  . Physical activity:    Days per week: Not on file    Minutes per session: Not on file  . Stress: Not on file  Relationships  . Social connections:    Talks on phone: Not on file    Gets together: Not on file    Attends religious service: Not on file    Active member of club or organization: Not on file    Attends meetings of clubs or organizations: Not on file    Relationship status: Not on file  . Intimate partner violence:    Fear of current or ex partner: Not on file    Emotionally abused: Not on file    Physically abused: Not on file    Forced sexual activity: Not on file  Other Topics Concern  . Not on file  Social History Narrative   She lives her  family, she has one child, retired, no smoke no drink     Family History  Problem Relation Age of Onset  . Emphysema Father        PGF, brother, sister  . COPD Father        PGF, brother, sister  . Heart disease Father   . Rheum arthritis Mother   . Stroke Mother   . Heart disease Mother  Father, brother, sister  . Lung cancer Paternal Grandfather   . Ovarian cancer Paternal Aunt   . Breast cancer Cousin   . Cancer Brother   . Heart disease Brother   . Heart disease Sister      ROS:  Please see the history of present illness.     All other systems reviewed and negative.    Physical Exam: Blood pressure 140/72, pulse 73, height 5\' 3"  (1.6 m), weight 113 lb (51.3 kg), SpO2 98 %. General: Well developed, well nourished female in no acute distress. Head: Normocephalic, atraumatic, sclera non-icteric, no xanthomas, nares are without discharge. EENT: normal  Lymph Nodes:  none Neck: Negative for carotid bruits. JVD not elevated. Back:without scoliosis kyphosis  Lungs: Clear bilaterally to auscultation without wheezes, rales, or rhonchi. Breathing is unlabored. Heart: RRR with S1 S2. 2/6 systolic murmur . No rubs, or gallops appreciated. Abdomen: Soft, non-tender, non-distended with normoactive bowel sounds. No hepatomegaly. No rebound/guarding. No obvious abdominal masses. Msk:  Strength and tone appear normal for age. Extremities: No clubbing or cyanosis. No edema.  Distal pedal pulses are 2+ and equal bilaterally. Skin: Warm and Dry Neuro: Alert and oriented X 3. CN III-XII intact Grossly normal sensory and motor function . Psych:  Responds to questions appropriately with a normal affect.      Labs: Cardiac Enzymes No results for input(s): CKTOTAL, CKMB, TROPONINI in the last 72 hours. CBC Lab Results  Component Value Date   WBC 3.4 (L) 03/25/2016   HGB 13.8 03/25/2016   HCT 40.5 03/25/2016   MCV 95.1 03/25/2016   PLT 170 03/25/2016   PROTIME: No results for  input(s): LABPROT, INR in the last 72 hours. Chemistry No results for input(s): NA, K, CL, CO2, BUN, CREATININE, CALCIUM, PROT, BILITOT, ALKPHOS, ALT, AST, GLUCOSE in the last 168 hours.  Invalid input(s): LABALBU Lipids Lab Results  Component Value Date   CHOL 208 (H) 05/05/2017   HDL 80 05/05/2017   LDLCALC 118 (H) 05/05/2017   TRIG 51 05/05/2017   BNP No results found for: PROBNP Thyroid Function Tests: No results for input(s): TSH, T4TOTAL, T3FREE, THYROIDAB in the last 72 hours.  Invalid input(s): FREET3 Miscellaneous No results found for: DDIMER  Radiology/Studies:  No results found.  EKG: 29 July 2017 sinus rhythm with atrial couplets with a functional rate of 30 06 Jul 2017 sinus bradycardia 48 7/19 event recorder isolated symptomatic event palpitations correlates with PAC3   Assessment and Plan:  Sinus bradycardia  PAC  Hypertension  We reviewed the ECGs and the event recorder strips.  I stressed the benign nature of PACs although acknowledging that they can be quite symptomatic.  Clearly, her sinus node will not tolerate calcium blockers and her beta-blockers; in the event that her symptoms are debilitating, I mentioned that we could use flecainide and would probably try a 25 twice daily if she decided to try it.  She will follow-up with Dr. Leda Gauze  I think her sinus node is not normal and she will likely end up requiring pacing  Her chart was reviewed back to 2012 where it was described by Dr. Elliot Cousin that she had "documented SVT "I do not see intervening episodes.  In the event that they recur, ablation as a strategy would probably be most effective so as to preclude the need for AV nodal blocking agents and subsequent pacing   Virl Axe

## 2017-09-03 NOTE — Patient Instructions (Signed)
Medication Instructions:  None ordered.  Labwork: None ordered.  Testing/Procedures: None ordered.  Follow-Up: Your physician recommends that you schedule a follow-up appointment as needed with Dr Caryl Comes.   Any Other Special Instructions Will Be Listed Below (If Applicable).     If you need a refill on your cardiac medications before your next appointment, please call your pharmacy.

## 2017-09-22 ENCOUNTER — Ambulatory Visit (INDEPENDENT_AMBULATORY_CARE_PROVIDER_SITE_OTHER): Payer: Medicare Other | Admitting: *Deleted

## 2017-09-22 DIAGNOSIS — E538 Deficiency of other specified B group vitamins: Secondary | ICD-10-CM

## 2017-09-22 MED ORDER — CYANOCOBALAMIN 1000 MCG/ML IJ SOLN
1000.0000 ug | Freq: Once | INTRAMUSCULAR | Status: AC
  Administered 2017-09-22: 1000 ug via INTRAMUSCULAR

## 2017-09-22 NOTE — Progress Notes (Signed)
Patient presented for B 12 injection to left deltoid, patient voiced no concerns nor showed any signs of distress during injection. 

## 2017-10-25 ENCOUNTER — Encounter: Payer: Self-pay | Admitting: Obstetrics & Gynecology

## 2017-10-25 ENCOUNTER — Other Ambulatory Visit (HOSPITAL_COMMUNITY)
Admission: RE | Admit: 2017-10-25 | Discharge: 2017-10-25 | Disposition: A | Discharge status: Home / Self Care | Payer: Medicare Other | Source: Ambulatory Visit | Attending: Obstetrics & Gynecology | Admitting: Obstetrics & Gynecology

## 2017-10-25 ENCOUNTER — Ambulatory Visit (INDEPENDENT_AMBULATORY_CARE_PROVIDER_SITE_OTHER): Payer: Medicare Other | Admitting: Obstetrics & Gynecology

## 2017-10-25 VITALS — BP 120/80 | Ht 64.0 in | Wt 109.0 lb

## 2017-10-25 DIAGNOSIS — R3911 Hesitancy of micturition: Secondary | ICD-10-CM | POA: Insufficient documentation

## 2017-10-25 DIAGNOSIS — R102 Pelvic and perineal pain: Secondary | ICD-10-CM | POA: Insufficient documentation

## 2017-10-25 DIAGNOSIS — N952 Postmenopausal atrophic vaginitis: Secondary | ICD-10-CM | POA: Diagnosis not present

## 2017-10-25 DIAGNOSIS — R319 Hematuria, unspecified: Secondary | ICD-10-CM

## 2017-10-25 DIAGNOSIS — I6529 Occlusion and stenosis of unspecified carotid artery: Secondary | ICD-10-CM | POA: Diagnosis not present

## 2017-10-25 LAB — POCT URINALYSIS DIPSTICK
Glucose, UA: NEGATIVE
Leukocytes, UA: NEGATIVE
PROTEIN UA: NEGATIVE
SPEC GRAV UA: 1.01 (ref 1.010–1.025)

## 2017-10-25 NOTE — Progress Notes (Signed)
CC: Pelvic Pressure Patient complains of a bladder pressure and lower pelvic pressure for 2 months, no vaginal pain or pressure there and no bleeding.  No freq or nocturia or LOU.  Pt describes hesitancy, incomplete emptying, and having to strain to void at times.  No change in bowel habits. Not sexually active.  No vag dryness.   PMHx: She  has a past medical history of Anxiety and depression, Arthritis, Asthmatic bronchitis, Bronchitis, Cancer ([place]), Depression, Dysrhythmia, Heart murmur, Hemorrhoids, History of melanoma, HLD (hyperlipidemia), Hypertension, Mitral valve prolapse, Osteoporosis, Personal history of malignant melanoma of skin, SVT (supraventricular tachycardia) ([place]), and Viral gastroenteritis. Also,  has a past surgical history that includes Endometrial ablation (1976); Melanoma excision (1999); left thumb tendon repair (10/09); Abdominal hysterectomy (1975); Colonoscopy (04/15/2004); Wrist surgery (Left, 2011); Skin cancer excision (03/02/2013); US ECHOCARDIOGRAPHY (10/16/2010); NM MYOCAR PERF WALL MOTION (12/28/2008); Cataract extraction w/PHACO (Right, 02/10/2017); and Cataract extraction w/PHACO (Left, 03/09/2017)., family history includes Breast cancer in her cousin; COPD in her father; Cancer in her brother; Emphysema in her father; Heart disease in her brother, father, mother, and sister; Lung cancer in her paternal grandfather; Ovarian cancer in her paternal aunt; Rheum arthritis in her mother; Stroke in her mother.,  reports that she quit smoking about 27 years ago. Her smoking use included cigarettes. She has a 10.00 pack-year smoking history. She has never used smokeless tobacco. She reports that she does not drink alcohol or use drugs.  She has a current medication list which includes the following prescription(s): amlodipine, bupropion, calcium-vitamin d, cyanocobalamin, ezetimibe, lorazepam, losartan, and polyethyl glycol-propyl glycol. Also, is allergic to codeine; adhesive [tape];  alendronate sodium; and verapamil.  Review of Systems  Constitutional: Negative for chills, fever and malaise/fatigue.  HENT: Negative for congestion, sinus pain and sore throat.   Eyes: Negative for blurred vision and pain.  Respiratory: Negative for cough and wheezing.   Cardiovascular: Negative for chest pain and leg swelling.  Gastrointestinal: Negative for abdominal pain, constipation, diarrhea, heartburn, nausea and vomiting.  Genitourinary: Negative for dysuria, frequency, hematuria and urgency.  Musculoskeletal: Negative for back pain, joint pain, myalgias and neck pain.  Skin: Negative for itching and rash.  Neurological: Negative for dizziness, tremors and weakness.  Endo/Heme/Allergies: Does not bruise/bleed easily.  Psychiatric/Behavioral: Negative for depression. The patient is not nervous/anxious and does not have insomnia.     Objective: BP 120/80   Ht 5\' 4"  (1.626 m)   Wt 109 lb (49.4 kg)   BMI 18.71 kg/m  Physical Exam  Constitutional: She is oriented to person, place, and time. She appears well-developed and well-nourished. No distress.  Genitourinary: Rectum normal and vagina normal. Pelvic exam was performed with patient supine. There is no rash or lesion on the right labia. There is no rash or lesion on the left labia. Vagina exhibits no lesion. No bleeding in the vagina. Right adnexum does not display mass and does not display tenderness. Left adnexum does not display mass and does not display tenderness.  Genitourinary Comments: Absent Uterus Absent cervix Vaginal cuff well healed Atrophy noted No cystocele or rectocele noted No pelvic mass  HENT:  Head: Normocephalic and atraumatic. Head is without laceration.  Right Ear: Hearing normal.  Left Ear: Hearing normal.  Nose: No epistaxis.  No foreign bodies.  Mouth/Throat: Uvula is midline, oropharynx is clear and moist and mucous membranes are normal.  Eyes: Pupils are equal, round, and reactive to light.    Neck: Normal range of motion. Neck supple. No  thyromegaly present.  Cardiovascular: Normal rate and regular rhythm. Exam reveals no gallop and no friction rub.  No murmur heard. Pulmonary/Chest: Effort normal and breath sounds normal. No respiratory distress. She has no wheezes. Right breast exhibits no mass, no skin change and no tenderness. Left breast exhibits no mass, no skin change and no tenderness.  Abdominal: Soft. Bowel sounds are normal. She exhibits no distension. There is no tenderness. There is no rebound.  Musculoskeletal: Normal range of motion.  Neurological: She is alert and oriented to person, place, and time. No cranial nerve deficit.  Skin: Skin is warm and dry.  Psychiatric: She has a normal mood and affect. Judgment normal.  Vitals reviewed.  Results for orders placed or performed in visit on 10/25/17  POCT urinalysis dipstick  Result Value Ref Range   Color, UA     Clarity, UA     Glucose, UA Negative Negative   Bilirubin, UA     Ketones, UA     Spec Grav, UA 1.010 1.010 - 1.025   Blood, UA trace    pH, UA     Protein, UA Negative Negative   Urobilinogen, UA     Nitrite, UA     Leukocytes, UA Negative Negative   Appearance     Odor     ASSESSMENT/PLAN:   Problem List Items Addressed This Visit      Other   Urinary hesitancy - Primary   Relevant Orders   Cytology - Non PAP;    Other Visit Diagnoses    Pelvic pressure in female       Relevant Orders   Cytology - Non PAP;   Hematuria, unspecified type       Relevant Orders   Cytology - Non PAP;   Vaginal atrophy        Check urine, no other abnormalities encountered.    Consider urology referral if sx's persist    No GYN source found Vaginal atrophy treatment options discussed.    Not sexually active.  No vag concerning sx's.  Barnett Applebaum, MD, [facility], [facility] 10/25/2017  11:57 AM

## 2017-10-27 ENCOUNTER — Ambulatory Visit (INDEPENDENT_AMBULATORY_CARE_PROVIDER_SITE_OTHER): Payer: Medicare Other

## 2017-10-27 DIAGNOSIS — E538 Deficiency of other specified B group vitamins: Secondary | ICD-10-CM

## 2017-10-27 MED ORDER — CYANOCOBALAMIN 1000 MCG/ML IJ SOLN
1000.0000 ug | Freq: Once | INTRAMUSCULAR | Status: AC
  Administered 2017-10-27: 1000 ug via INTRAMUSCULAR

## 2017-10-27 NOTE — Progress Notes (Addendum)
Patient comes in for B 12 injection.  Injected right  deltoid.  Patient tolerated injection well.    I have reviewed the above information and agree with above.   Teresa Tullo, MD 

## 2017-11-01 ENCOUNTER — Telehealth: Payer: Self-pay

## 2017-11-01 NOTE — Telephone Encounter (Signed)
Pt calling for urine results from a week ago.  [phone]

## 2017-11-03 NOTE — Telephone Encounter (Signed)
Pt aware.

## 2017-11-03 NOTE — Telephone Encounter (Signed)
Patient calling for lab results.   Please advise.

## 2017-11-03 NOTE — Telephone Encounter (Signed)
Urine cytology negative for cancer.  Let her know.

## 2017-12-01 ENCOUNTER — Ambulatory Visit (INDEPENDENT_AMBULATORY_CARE_PROVIDER_SITE_OTHER): Payer: Medicare Other | Admitting: *Deleted

## 2017-12-01 DIAGNOSIS — E538 Deficiency of other specified B group vitamins: Secondary | ICD-10-CM

## 2017-12-01 MED ORDER — CYANOCOBALAMIN 1000 MCG/ML IJ SOLN
1000.0000 ug | Freq: Once | INTRAMUSCULAR | Status: AC
  Administered 2017-12-01: 1000 ug via INTRAMUSCULAR

## 2017-12-01 NOTE — Progress Notes (Signed)
Pt received B12 injection in left deltoid, tolerated well.

## 2017-12-07 ENCOUNTER — Ambulatory Visit (INDEPENDENT_AMBULATORY_CARE_PROVIDER_SITE_OTHER): Payer: Medicare Other | Admitting: Internal Medicine

## 2017-12-07 ENCOUNTER — Encounter: Payer: Self-pay | Admitting: Internal Medicine

## 2017-12-07 VITALS — BP 122/78 | HR 88 | Temp 98.2°F | Resp 15 | Ht 64.0 in | Wt 109.5 lb

## 2017-12-07 DIAGNOSIS — E538 Deficiency of other specified B group vitamins: Secondary | ICD-10-CM | POA: Diagnosis not present

## 2017-12-07 DIAGNOSIS — F329 Major depressive disorder, single episode, unspecified: Secondary | ICD-10-CM

## 2017-12-07 DIAGNOSIS — Z85820 Personal history of malignant melanoma of skin: Secondary | ICD-10-CM

## 2017-12-07 DIAGNOSIS — I6529 Occlusion and stenosis of unspecified carotid artery: Secondary | ICD-10-CM | POA: Diagnosis not present

## 2017-12-07 DIAGNOSIS — N183 Chronic kidney disease, stage 3 unspecified: Secondary | ICD-10-CM

## 2017-12-07 DIAGNOSIS — F419 Anxiety disorder, unspecified: Secondary | ICD-10-CM

## 2017-12-07 DIAGNOSIS — I2510 Atherosclerotic heart disease of native coronary artery without angina pectoris: Secondary | ICD-10-CM

## 2017-12-07 DIAGNOSIS — E039 Hypothyroidism, unspecified: Secondary | ICD-10-CM

## 2017-12-07 DIAGNOSIS — M810 Age-related osteoporosis without current pathological fracture: Secondary | ICD-10-CM

## 2017-12-07 DIAGNOSIS — Z1239 Encounter for other screening for malignant neoplasm of breast: Secondary | ICD-10-CM | POA: Diagnosis not present

## 2017-12-07 DIAGNOSIS — F32A Depression, unspecified: Secondary | ICD-10-CM

## 2017-12-07 DIAGNOSIS — R634 Abnormal weight loss: Secondary | ICD-10-CM

## 2017-12-07 DIAGNOSIS — I10 Essential (primary) hypertension: Secondary | ICD-10-CM

## 2017-12-07 NOTE — Progress Notes (Signed)
Patient ID: Chloe Perkins, female    DOB: Feb 01, 1946  Age: 72 y.o. MRN: [id number]  The patient is here for annual preventive examination and management of other chronic and acute problems.    Mammogram due  Osteoporosis.;  Doesn't want prolia or any other meds for treatment of osteoporosis Does not want colon ca screening    The risk factors are reflected in the social history.  The roster of all physicians providing medical care to patient - is listed in the Snapshot section of the chart.  Activities of daily living:  The patient is 100% independent in all ADLs: dressing, toileting, feeding as well as independent mobility  Home safety : The patient has smoke detectors in the home. They wear seatbelts.  There are no firearms at home. There is no violence in the home.   There is no risks for hepatitis, STDs or HIV. There is no   history of blood transfusion. They have no travel history to infectious disease endemic areas of the world.  The patient has seen their dentist in the last six month. They have seen their eye doctor in the last year. They admit to slight hearing difficulty with regard to whispered voices and some television programs.  They have deferred audiologic testing in the last year.  They do not  have excessive sun exposure. Discussed the need for sun protection: hats, long sleeves and use of sunscreen if there is significant sun exposure.   Diet: the importance of a healthy diet is discussed. They do have a healthy diet.  The benefits of regular aerobic exercise were discussed. She walks 4 times per week ,  20 minutes.   Depression screen: there are no signs or vegative symptoms of depression- irritability, change in appetite, anhedonia, sadness/tearfullness.  Cognitive assessment: the patient manages all their financial and personal affairs and is actively engaged. They could relate day,date,year and events; recalled 2/3 objects at 3 minutes; performed clock-face test  normally.  The following portions of the patient's history were reviewed and updated as appropriate: allergies, current medications, past family history, past medical history,  past surgical history, past social history  and problem list.  Visual acuity was not assessed per patient preference since she has regular follow up with her ophthalmologist. Hearing and body mass index were assessed and reviewed.   During the course of the visit the patient was educated and counseled about appropriate screening and preventive services including : fall prevention , diabetes screening, nutrition counseling, colorectal cancer screening, and recommended immunizations.    CC: The primary encounter diagnosis was Breast cancer screening. Diagnoses of Anxiety and depression, B12 deficiency, Coronary artery disease involving native coronary artery of native heart without angina pectoris, Essential hypertension, Hypothyroidism, unspecified type, Age-related osteoporosis without current pathological fracture, Personal history of malignant melanoma of skin, CKD (chronic kidney disease) stage 3, GFR 30-59 ml/min ([place]), and Weight loss were also pertinent to this visit.  Grief,  Stress,  Skin rash on left thigh  Improving with Lamisil Wants to defer flu vaccine until she finishes LAMASIL Does not take cholesterol medication in spite of review of prior imaging studies showing atherosclerosis    10 lb weight loss since last visit; patient denies weight loss .  Does acknowledge increased stressors including grief: Has lost 6 siblings of ten siblings most recently her sister died 3 months ago,   From emphysema.  Bother died of lung CA .  2 more sisters and 2 brothers left, one sister  has dementia and has no family of her own .  (She had 10 siblings )    History Kamirah has a past medical history of Anxiety and depression, Arthritis, Asthmatic bronchitis, Bronchitis, Cancer ([place]), Depression, Dysrhythmia, Heart murmur,  Hemorrhoids, History of melanoma, HLD (hyperlipidemia), Hypertension, Mitral valve prolapse, Osteoporosis, Personal history of malignant melanoma of skin, SVT (supraventricular tachycardia) ([place]), and Viral gastroenteritis.   She has a past surgical history that includes Endometrial ablation (1976); Melanoma excision (1999); left thumb tendon repair (10/09); Abdominal hysterectomy (1975); Colonoscopy (04/15/2004); Wrist surgery (Left, 2011); Skin cancer excision (03/02/2013); US ECHOCARDIOGRAPHY (10/16/2010); NM MYOCAR PERF WALL MOTION (12/28/2008); Cataract extraction w/PHACO (Right, 02/10/2017); and Cataract extraction w/PHACO (Left, 03/09/2017).   Her family history includes Breast cancer in her cousin; COPD in her father; Cancer in her brother; Emphysema in her father; Heart disease in her brother, father, mother, and sister; Lung cancer in her paternal grandfather; Ovarian cancer in her paternal aunt; Rheum arthritis in her mother; Stroke in her mother.She reports that she quit smoking about 27 years ago. Her smoking use included cigarettes. She has a 10.00 pack-year smoking history. She has never used smokeless tobacco. She reports that she does not drink alcohol or use drugs.  Outpatient Medications Prior to Visit  Medication Sig Dispense Refill  . buPROPion (WELLBUTRIN XL) 150 MG 24 hr tablet Take 450 mg by mouth daily.    . calcium-vitamin D (OSCAL WITH D) 500-200 MG-UNIT per tablet Take 1 tablet by mouth 2 (two) times daily.      . cyanocobalamin (,VITAMIN B-12,) 1000 MCG/ML injection Inject 1,000 mcg into the muscle every 30 (thirty) days.    Marland Kitchen LORazepam (ATIVAN) 0.5 MG tablet Take 0.5 mg by mouth See admin instructions. TAKE 1 TABLET (0.5 MG) SCHEDULED IN THE MORNING & 1 TABLET (0.5 MG) IN THE EVENING AS NEEDED FOR ANXIETY.  5  . losartan (COZAAR) 100 MG tablet Take 1 tablet (100 mg total) by mouth daily. 90 tablet 3  . Polyethyl Glycol-Propyl Glycol (SYSTANE) 0.4-0.3 % SOLN Place 1-2 drops into  both eyes 3 (three) times daily as needed (for dry eyes.).    Marland Kitchen terbinafine (LAMISIL) 250 MG tablet TAKE 1 TABLET BY MOUTH ONCE DAILY FOR 21 DAYS  0  . amLODipine (NORVASC) 5 MG tablet Take 1 tablet (5 mg total) by mouth daily. 90 tablet 3  . ezetimibe (ZETIA) 10 MG tablet Take 10 mg by mouth daily.     No facility-administered medications prior to visit.     Review of Systems   Patient denies headache, fevers, malaise, unintentional weight loss, , eye pain, sinus congestion and sinus pain, sore throat, dysphagia,  hemoptysis , cough, dyspnea, wheezing, chest pain, palpitations, orthopnea, edema, abdominal pain, nausea, melena, diarrhea, constipation, flank pain, dysuria, hematuria, urinary  Frequency, nocturia, numbness, tingling, seizures,  Focal weakness, Loss of consciousness,  Tremor, insomnia, depression, anxiety, and suicidal ideation.      Objective:  BP 122/78 (BP Location: Left Arm, Patient Position: Sitting, Cuff Size: Normal)   Pulse 88   Temp 98.2 F (36.8 C) (Oral)   Resp 15   Ht 5\' 4"  (1.626 m)   Wt 109 lb 8 oz (49.7 kg)   SpO2 96%   BMI 18.80 kg/m   Physical Exam   General appearance: alert, cooperative and appears stated age Ears: normal TM's and external ear canals both ears Throat: lips, mucosa, and tongue normal; teeth and gums normal Neck: no adenopathy, no carotid bruit, supple, symmetrical,  trachea midline and thyroid not enlarged, symmetric, no tenderness/mass/nodules Back: symmetric, no curvature. ROM normal. No CVA tenderness. Lungs: clear to auscultation bilaterally Heart: regular rate and rhythm, S1, S2 normal, no murmur, click, rub or gallop Abdomen: soft, non-tender; bowel sounds normal; no masses,  no organomegaly Pulses: 2+ and symmetric Skin: Skin color, texture, turgor normal. No rashes or lesions Lymph nodes: Cervical, supraclavicular, and axillary nodes normal.    Assessment & Plan:   Problem List Items Addressed This Visit    Anxiety  and depression    Managed by Dr Casimiro Needle with twice daily lorazepam and wellbutrin      B12 deficiency    Managed with monthly injections since her diagnosis in 2017      CAD (coronary artery disease), native coronary artery    Asymptomatic finding on chest CT.  She continues to decline statin therapy and Zetia despite full discussion of the benefits in placque stabilization  And intimal layer reduction       CKD (chronic kidney disease) stage 3, GFR 30-59 ml/min ([facility])    Repeat labs show stable cr and normal electrolytes.  Repeat is due  Lab Results  Component Value Date   CREATININE 1.07 (H) 05/05/2017   Lab Results  Component Value Date   NA 139 05/05/2017   K 4.4 05/05/2017   CL 106 05/05/2017   CO2 26 05/05/2017         Essential hypertension    Well controlled on current regimen. Renal function  Is due .  Lab Results  Component Value Date   CREATININE 1.07 (H) 05/05/2017   Lab Results  Component Value Date   NA 139 05/05/2017   K 4.4 05/05/2017   CL 106 05/05/2017   CO2 26 05/05/2017         Hypothyroidism    Subclinical..  Intolerant of low dose synthroid on prior trials. Will repeat tsh .       Relevant Orders   TSH   Osteoporosis    With no history of fractures,   Alendronate inolerant.  Prolia advised but deferred by patient. continue calcium supplementation and weight bearing exercise. Evista relatively C/I given CAD      PERSONAL HISTORY OF MALIGNANT MELANOMA OF SKIN    She has annual dermatology follow up       Weight loss    She is up to date on screening and refuses further workup since she feels well. She attributes the loss to grief and stress.       Other Visit Diagnoses    Breast cancer screening    -  Primary   Relevant Orders   MM 3D SCREEN BREAST BILATERAL    A total of 40 minutes was spent with patient more than half of which was spent in counseling patient on the above mentioned issues , reviewing and explaining recent labs  and imaging studies done, and coordination of care.  I have discontinued Quetzali L. Lohn's ezetimibe. I am also having her maintain her calcium-vitamin D, LORazepam, buPROPion, Polyethyl Glycol-Propyl Glycol, cyanocobalamin, amLODipine, losartan, and terbinafine.  No orders of the defined types were placed in this encounter.   Medications Discontinued During This Encounter  Medication Reason  . ezetimibe (ZETIA) 10 MG tablet Patient Preference    Follow-up: Return in about 1 year (around 12/08/2018).   [name], MD

## 2017-12-07 NOTE — Patient Instructions (Signed)
Your annual mammogram has been ordered.  You are encouraged  to call [facility] to make your appointment   [phone]    Preventive Care 65 Years and Older, Female Preventive care refers to lifestyle choices and visits with your health care provider that can promote health and wellness. What does preventive care include?  A yearly physical exam. This is also called an annual well check.  Dental exams once or twice a year.  Routine eye exams. Ask your health care provider how often you should have your eyes checked.  Personal lifestyle choices, including: ? Daily care of your teeth and gums. ? Regular physical activity. ? Eating a healthy diet. ? Avoiding tobacco and drug use. ? Limiting alcohol use. ? Practicing safe sex. ? Taking low-dose aspirin every day. ? Taking vitamin and mineral supplements as recommended by your health care provider. What happens during an annual well check? The services and screenings done by your health care provider during your annual well check will depend on your age, overall health, lifestyle risk factors, and family history of disease. Counseling Your health care provider may ask you questions about your:  Alcohol use.  Tobacco use.  Drug use.  Emotional well-being.  Home and relationship well-being.  Sexual activity.  Eating habits.  History of falls.  Memory and ability to understand (cognition).  Work and work Statistician.  Reproductive health.  Screening You may have the following tests or measurements:  Height, weight, and BMI.  Blood pressure.  Lipid and cholesterol levels. These may be checked every 5 years, or more frequently if you are over 20 years old.  Skin check.  Lung cancer screening. You may have this screening every year starting at age 43 if you have a 30-pack-year history of smoking and currently smoke or have quit within the past 15 years.  Fecal occult blood test (FOBT) of the stool. You may have this  test every year starting at age 69.  Flexible sigmoidoscopy or colonoscopy. You may have a sigmoidoscopy every 5 years or a colonoscopy every 10 years starting at age 104.  Hepatitis C blood test.  Hepatitis B blood test.  Sexually transmitted disease (STD) testing.  Diabetes screening. This is done by checking your blood sugar (glucose) after you have not eaten for a while (fasting). You may have this done every 1-3 years.  Bone density scan. This is done to screen for osteoporosis. You may have this done starting at age 56.  Mammogram. This may be done every 1-2 years. Talk to your health care provider about how often you should have regular mammograms.  Talk with your health care provider about your test results, treatment options, and if necessary, the need for more tests. Vaccines Your health care provider may recommend certain vaccines, such as:  Influenza vaccine. This is recommended every year.  Tetanus, diphtheria, and acellular pertussis (Tdap, Td) vaccine. You may need a Td booster every 10 years.  Varicella vaccine. You may need this if you have not been vaccinated.  Zoster vaccine. You may need this after age 67.  Measles, mumps, and rubella (MMR) vaccine. You may need at least one dose of MMR if you were born in 1957 or later. You may also need a second dose.  Pneumococcal 13-valent conjugate (PCV13) vaccine. One dose is recommended after age 46.  Pneumococcal polysaccharide (PPSV23) vaccine. One dose is recommended after age 27.  Meningococcal vaccine. You may need this if you have certain conditions.  Hepatitis A  vaccine. You may need this if you have certain conditions or if you travel or work in places where you may be exposed to hepatitis A.  Hepatitis B vaccine. You may need this if you have certain conditions or if you travel or work in places where you may be exposed to hepatitis B.  Haemophilus influenzae type b (Hib) vaccine. You may need this if you  have certain conditions.  Talk to your health care provider about which screenings and vaccines you need and how often you need them. This information is not intended to replace advice given to you by your health care provider. Make sure you discuss any questions you have with your health care provider. Document Released: 02/22/2015 Document Revised: 10/16/2015 Document Reviewed: 11/27/2014 Elsevier Interactive Patient Education  [employer].

## 2017-12-08 DIAGNOSIS — R634 Abnormal weight loss: Secondary | ICD-10-CM | POA: Insufficient documentation

## 2017-12-08 NOTE — Assessment & Plan Note (Signed)
Subclinical..  Intolerant of low dose synthroid on prior trials. Will repeat tsh .

## 2017-12-08 NOTE — Assessment & Plan Note (Signed)
Managed by Dr Casimiro Needle with twice daily lorazepam and wellbutrin

## 2017-12-08 NOTE — Assessment & Plan Note (Signed)
Asymptomatic finding on chest CT.  She continues to decline statin therapy and Zetia despite full discussion of the benefits in placque stabilization  And intimal layer reduction

## 2017-12-08 NOTE — Assessment & Plan Note (Signed)
She has annual dermatology follow up

## 2017-12-08 NOTE — Assessment & Plan Note (Signed)
Repeat labs show stable cr and normal electrolytes.  Repeat is due  Lab Results  Component Value Date   CREATININE 1.07 (H) 05/05/2017   Lab Results  Component Value Date   NA 139 05/05/2017   K 4.4 05/05/2017   CL 106 05/05/2017   CO2 26 05/05/2017

## 2017-12-08 NOTE — Assessment & Plan Note (Signed)
Well controlled on current regimen. Renal function  Is due .  Lab Results  Component Value Date   CREATININE 1.07 (H) 05/05/2017   Lab Results  Component Value Date   NA 139 05/05/2017   K 4.4 05/05/2017   CL 106 05/05/2017   CO2 26 05/05/2017

## 2017-12-08 NOTE — Assessment & Plan Note (Signed)
With no history of fractures,   Alendronate inolerant.  Prolia advised but deferred by patient. continue calcium supplementation and weight bearing exercise. Evista relatively C/I given CAD 

## 2017-12-08 NOTE — Assessment & Plan Note (Signed)
Managed with monthly injections since her diagnosis in 2017

## 2017-12-08 NOTE — Assessment & Plan Note (Signed)
She is up to date on screening and refuses further workup since she feels well. She attributes the loss to grief and stress.

## 2017-12-09 NOTE — Progress Notes (Signed)
Spoke with pt and she stated that she could not do next week at all and asked that I give her a call back in two weeks to try an schedule the fasting lab appt.

## 2017-12-16 ENCOUNTER — Other Ambulatory Visit (INDEPENDENT_AMBULATORY_CARE_PROVIDER_SITE_OTHER): Payer: Medicare Other

## 2017-12-16 DIAGNOSIS — E039 Hypothyroidism, unspecified: Secondary | ICD-10-CM | POA: Diagnosis not present

## 2017-12-16 DIAGNOSIS — N183 Chronic kidney disease, stage 3 unspecified: Secondary | ICD-10-CM

## 2017-12-16 DIAGNOSIS — E782 Mixed hyperlipidemia: Secondary | ICD-10-CM

## 2017-12-16 LAB — COMPREHENSIVE METABOLIC PANEL
ALBUMIN: 4.6 g/dL (ref 3.5–5.2)
ALK PHOS: 60 U/L (ref 39–117)
ALT: 13 U/L (ref 0–35)
AST: 21 U/L (ref 0–37)
BUN: 22 mg/dL (ref 6–23)
CO2: 29 mEq/L (ref 19–32)
CREATININE: 1.01 mg/dL (ref 0.40–1.20)
Calcium: 10.7 mg/dL — ABNORMAL HIGH (ref 8.4–10.5)
Chloride: 103 mEq/L (ref 96–112)
GFR: 57.02 mL/min — ABNORMAL LOW (ref >60.00)
GLUCOSE: 94 mg/dL (ref 70–99)
Potassium: 4.6 mEq/L (ref 3.5–5.1)
SODIUM: 140 meq/L (ref 135–145)
TOTAL PROTEIN: 7.4 g/dL (ref 6.0–8.3)
Total Bilirubin: 0.5 mg/dL (ref 0.2–1.2)

## 2017-12-16 LAB — LIPID PANEL
CHOLESTEROL: 191 mg/dL (ref 0–200)
HDL: 85.4 mg/dL (ref >39.00)
LDL Cholesterol: 89 mg/dL (ref 0–99)
NONHDL: 105.6
Total CHOL/HDL Ratio: 2
Triglycerides: 83 mg/dL (ref 0.0–149.0)
VLDL: 16.6 mg/dL (ref 0.0–40.0)

## 2017-12-16 LAB — TSH: TSH: 4.32 u[IU]/mL (ref 0.35–4.50)

## 2017-12-20 ENCOUNTER — Encounter: Payer: Self-pay | Admitting: *Deleted

## 2017-12-24 ENCOUNTER — Other Ambulatory Visit: Payer: Self-pay

## 2017-12-24 ENCOUNTER — Telehealth: Payer: Self-pay

## 2017-12-24 NOTE — Telephone Encounter (Signed)
Copied from [place] [id number]. Topic: General - Other >> Dec 24, 2017 11:16 AM Rayann Heman wrote: Reason for CRM: pt called and stated that she has not received lab work from 12/16/17. Please advise [phone]   **Patient notified that letter was sent & results wnl.**

## 2018-01-04 ENCOUNTER — Ambulatory Visit (INDEPENDENT_AMBULATORY_CARE_PROVIDER_SITE_OTHER): Payer: Medicare Other

## 2018-01-04 ENCOUNTER — Telehealth: Payer: Self-pay

## 2018-01-04 DIAGNOSIS — E538 Deficiency of other specified B group vitamins: Secondary | ICD-10-CM

## 2018-01-04 MED ORDER — CYANOCOBALAMIN 1000 MCG/ML IJ SOLN
1000.0000 ug | Freq: Once | INTRAMUSCULAR | Status: AC
  Administered 2018-01-04: 1000 ug via INTRAMUSCULAR

## 2018-01-04 NOTE — Telephone Encounter (Signed)
Patient wondering if she could be referred to another dermatologist for second opinion. She has been seeing Dr Evorn Gong and he prescribed Lamisil and she has been taking  Rash no better on left upper thigh. Not scheduled to follow up with Dr Evorn Gong until January.  Prefers just to see another dermatologist.

## 2018-01-04 NOTE — Telephone Encounter (Signed)
The only othr group in town is Dr Nehemiah Massed and Brendolyn Patty,  They are  at [facility] skin

## 2018-01-04 NOTE — Progress Notes (Signed)
Patient comes in for B 12 injection.  Injected right deltoid.  Patient tolerated injection well.   

## 2018-01-04 NOTE — Telephone Encounter (Signed)
If she has not followed up with Dr Evorn Gong since starting the medication ,  She needs to follow up with him first before I will make another referral

## 2018-01-04 NOTE — Telephone Encounter (Signed)
Spoke with patient advised her to contact Dr Evorn Gong. She states she has tried Lamisil cream and pills and antibiotic no relief  . She would just like a name of dermatologist in area you recommend.

## 2018-01-05 NOTE — Telephone Encounter (Signed)
Patients husband advised of below

## 2018-01-09 NOTE — Progress Notes (Signed)
  I have reviewed the above information and agree with above.   Yamir Carignan, MD 

## 2018-02-04 ENCOUNTER — Ambulatory Visit (INDEPENDENT_AMBULATORY_CARE_PROVIDER_SITE_OTHER): Payer: Medicare Other | Admitting: *Deleted

## 2018-02-04 DIAGNOSIS — E538 Deficiency of other specified B group vitamins: Secondary | ICD-10-CM

## 2018-02-04 MED ORDER — CYANOCOBALAMIN 1000 MCG/ML IJ SOLN
1000.0000 ug | Freq: Once | INTRAMUSCULAR | Status: AC
  Administered 2018-02-04: 1000 ug via INTRAMUSCULAR

## 2018-02-04 NOTE — Progress Notes (Signed)
Patient presented for B 12 injection to left deltoid, patient voiced no concerns nor showed any signs of distress during injection. 

## 2018-02-08 ENCOUNTER — Ambulatory Visit: Payer: Medicare Other

## 2018-02-10 ENCOUNTER — Ambulatory Visit
Admission: RE | Admit: 2018-02-10 | Discharge: 2018-02-10 | Disposition: A | Discharge status: Home / Self Care | Payer: Medicare Other | Source: Ambulatory Visit | Attending: Internal Medicine | Admitting: Internal Medicine

## 2018-02-10 DIAGNOSIS — Z1239 Encounter for other screening for malignant neoplasm of breast: Secondary | ICD-10-CM

## 2018-02-10 DIAGNOSIS — Z1231 Encounter for screening mammogram for malignant neoplasm of breast: Secondary | ICD-10-CM | POA: Insufficient documentation

## 2018-03-08 ENCOUNTER — Ambulatory Visit (INDEPENDENT_AMBULATORY_CARE_PROVIDER_SITE_OTHER): Payer: Medicare Other

## 2018-03-08 DIAGNOSIS — E538 Deficiency of other specified B group vitamins: Secondary | ICD-10-CM | POA: Diagnosis not present

## 2018-03-08 MED ORDER — CYANOCOBALAMIN 1000 MCG/ML IJ SOLN
1000.0000 ug | Freq: Once | INTRAMUSCULAR | Status: AC
  Administered 2018-03-08: 1000 ug via INTRAMUSCULAR

## 2018-03-08 NOTE — Progress Notes (Signed)
Patient came in to day for B-12 injection in RD. Patient tolerated well.  

## 2018-04-04 ENCOUNTER — Ambulatory Visit (INDEPENDENT_AMBULATORY_CARE_PROVIDER_SITE_OTHER): Payer: Medicare Other

## 2018-04-04 DIAGNOSIS — E538 Deficiency of other specified B group vitamins: Secondary | ICD-10-CM

## 2018-04-04 MED ORDER — CYANOCOBALAMIN 1000 MCG/ML IJ SOLN
1000.0000 ug | Freq: Once | INTRAMUSCULAR | Status: AC
  Administered 2018-04-04: 1000 ug via INTRAMUSCULAR

## 2018-04-04 NOTE — Progress Notes (Addendum)
Patient presented for B 12 injection to left deltoid, patient voiced no concerns nor showed any signs of distress during injection   I have reviewed the above information and agree with above.   Teresa Tullo, MD 

## 2018-04-07 ENCOUNTER — Ambulatory Visit: Payer: Medicare Other

## 2018-04-08 IMAGING — MG MM DIGITAL SCREENING BILAT W/ TOMO W/ CAD
8 of 12 series · 8 of 28 positions shown · non-contrast
Comparison: Previous exam(s).

CLINICAL DATA: Screening.

EXAM:
2D DIGITAL SCREENING BILATERAL MAMMOGRAM WITH CAD AND ADJUNCT TOMO

[R MLO]
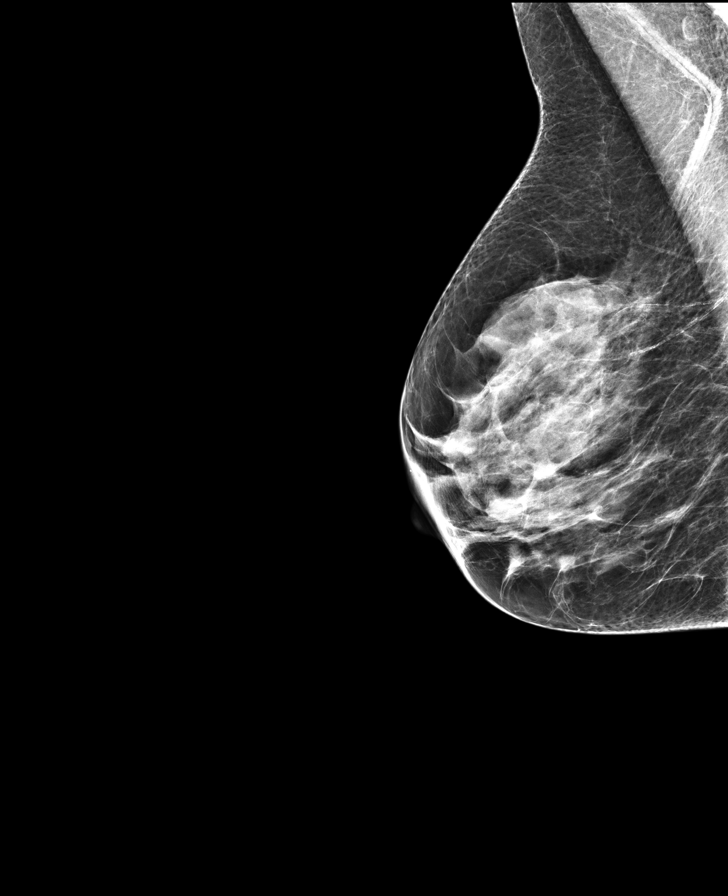

[L MLO]
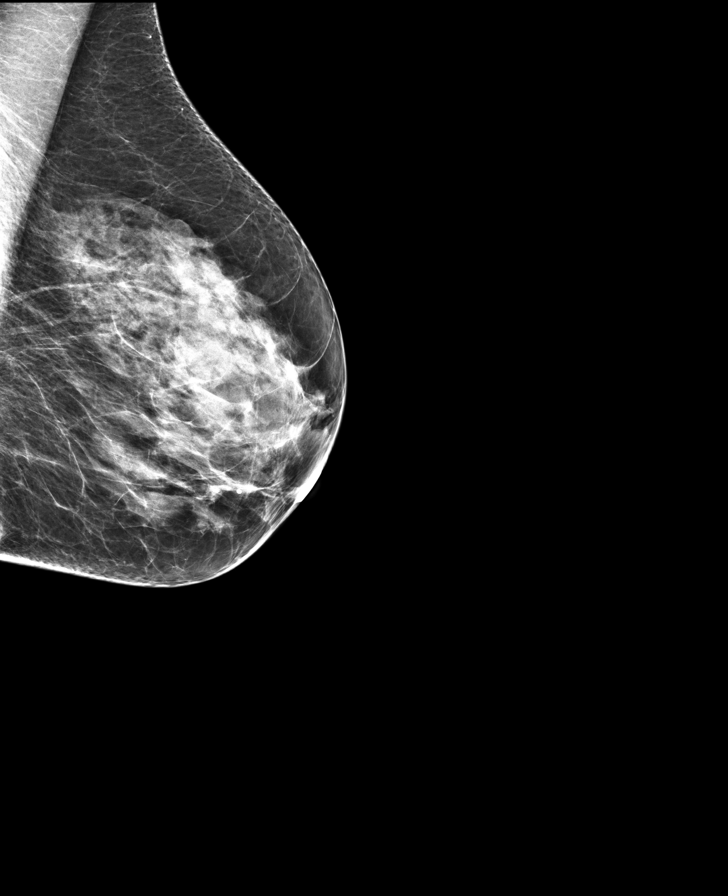

[L MLO synth-2D]
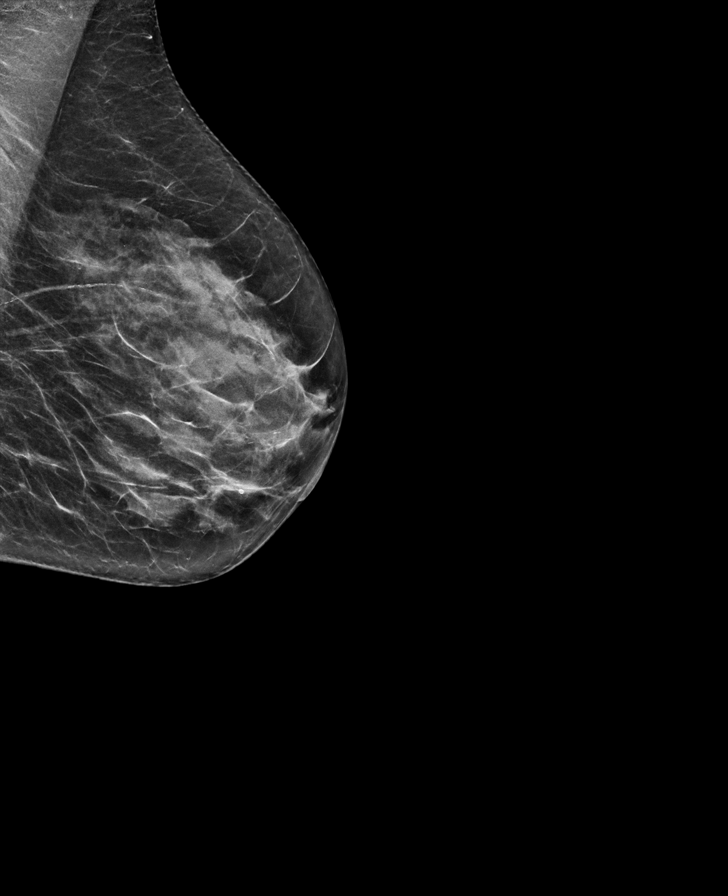

[L CC]
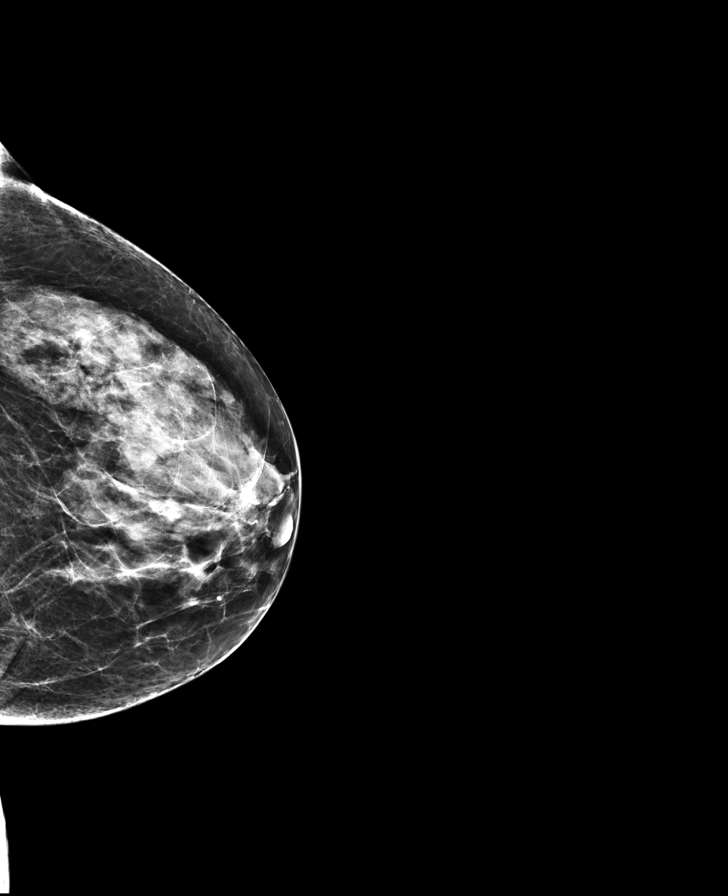

[L CC synth-2D]
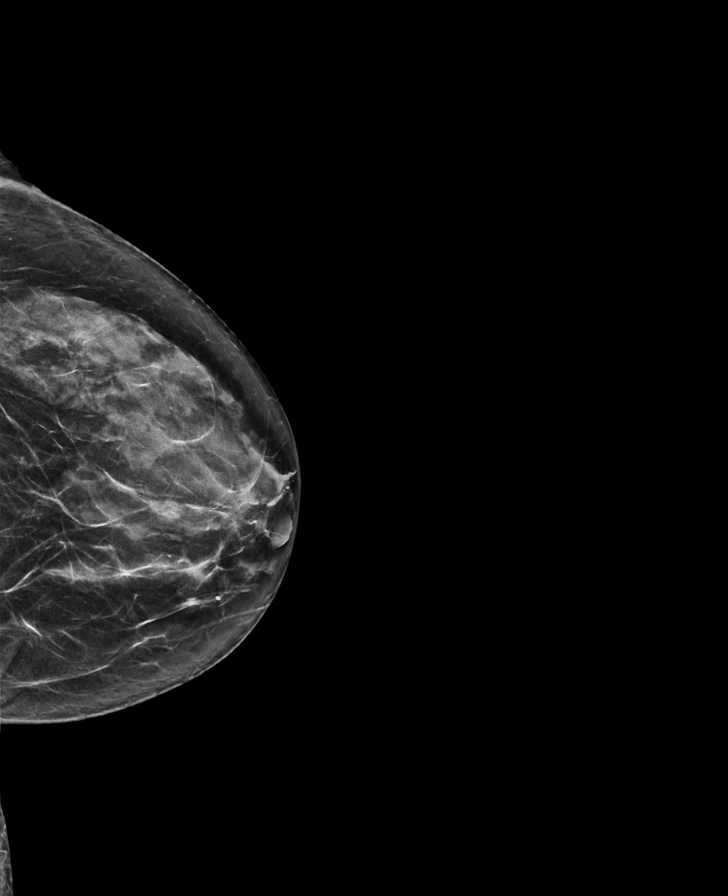

[R CC synth-2D]
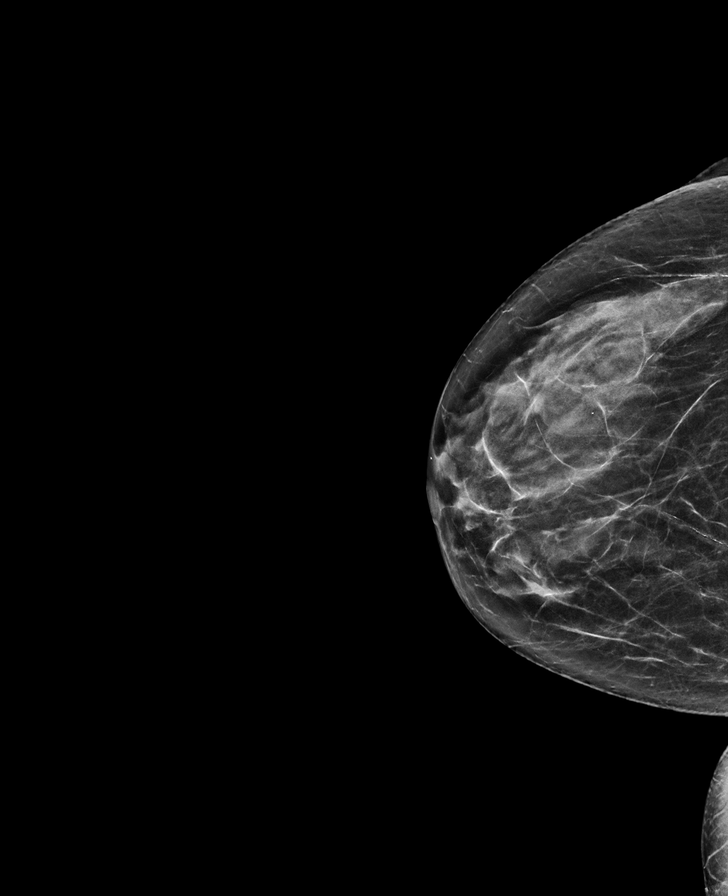

[R MLO synth-2D]
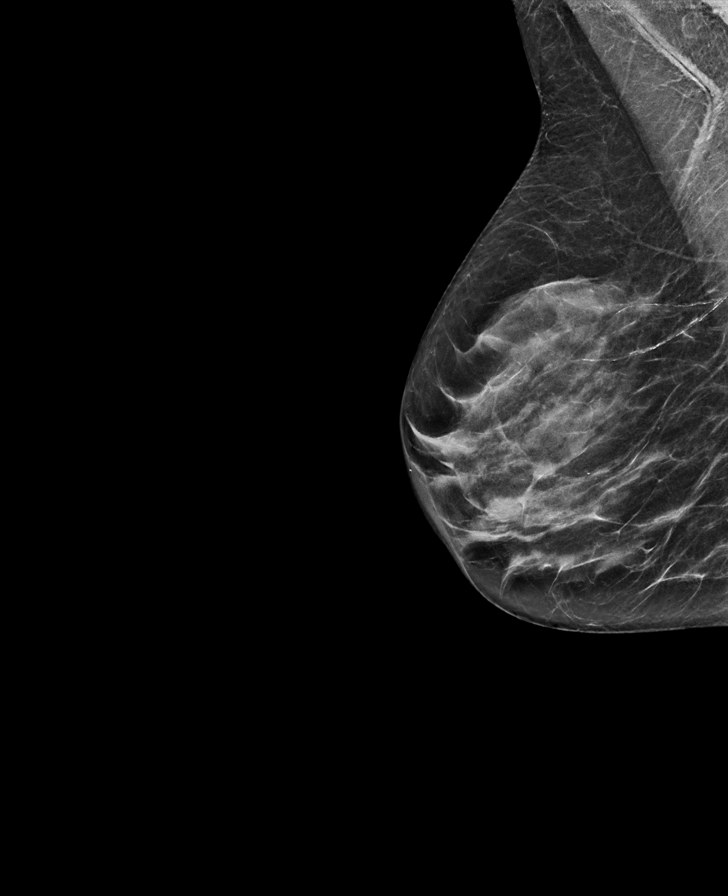

[R CC]
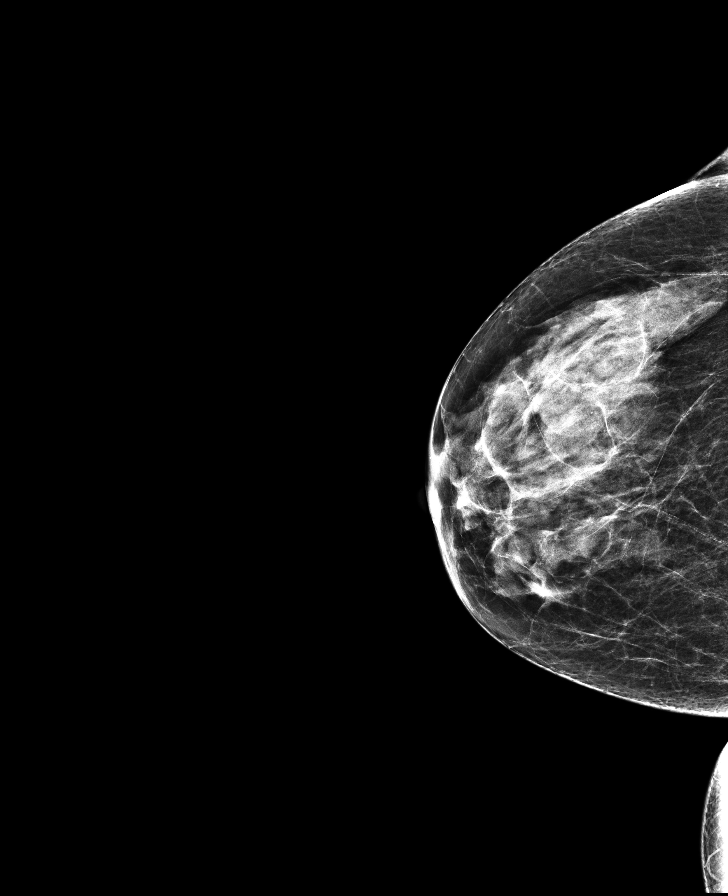

[8 of 28 positions shown; findings below may reference images not displayed]

ACR Breast Density Category c: The breast tissue is heterogeneously
dense, which may obscure small masses.
FINDINGS: There are no findings suspicious for malignancy. Images were
processed with CAD.
IMPRESSION: No mammographic evidence of malignancy. A result letter of this
screening mammogram will be mailed directly to the patient.

RECOMMENDATION:
Screening mammogram in one year. (Code:[id number])

BI-RADS CATEGORY  1: Negative.

## 2018-04-11 ENCOUNTER — Encounter: Payer: Self-pay | Admitting: Cardiovascular Disease

## 2018-04-11 ENCOUNTER — Ambulatory Visit (INDEPENDENT_AMBULATORY_CARE_PROVIDER_SITE_OTHER): Payer: Medicare Other | Admitting: Cardiovascular Disease

## 2018-04-11 VITALS — BP 122/72 | HR 57 | Ht 63.0 in | Wt 110.6 lb

## 2018-04-11 DIAGNOSIS — F419 Anxiety disorder, unspecified: Secondary | ICD-10-CM

## 2018-04-11 DIAGNOSIS — I471 Supraventricular tachycardia: Secondary | ICD-10-CM | POA: Diagnosis not present

## 2018-04-11 DIAGNOSIS — I10 Essential (primary) hypertension: Secondary | ICD-10-CM | POA: Diagnosis not present

## 2018-04-11 DIAGNOSIS — F32A Depression, unspecified: Secondary | ICD-10-CM

## 2018-04-11 DIAGNOSIS — E785 Hyperlipidemia, unspecified: Secondary | ICD-10-CM

## 2018-04-11 DIAGNOSIS — F329 Major depressive disorder, single episode, unspecified: Secondary | ICD-10-CM

## 2018-04-11 DIAGNOSIS — I6529 Occlusion and stenosis of unspecified carotid artery: Secondary | ICD-10-CM | POA: Diagnosis not present

## 2018-04-11 MED ORDER — AMLODIPINE BESYLATE 5 MG PO TABS: 5.0000 mg | ORAL_TABLET | Freq: Every day | ORAL | 3 refills | Status: DC

## 2018-04-11 MED ORDER — LOSARTAN POTASSIUM 100 MG PO TABS: 100.0000 mg | ORAL_TABLET | Freq: Every day | ORAL | 3 refills | Status: DC

## 2018-04-11 NOTE — Patient Instructions (Signed)
Medication Instructions:  The current medical regimen is effective;  continue present plan and medications.  If you need a refill on your cardiac medications before your next appointment, please call your pharmacy.   Follow-Up: At [facility], you and your health needs are our priority.  As part of our continuing mission to provide you with exceptional heart care, we have created designated Provider Care Teams.  These Care Teams include your primary Cardiologist (physician) and Advanced Practice Providers (APPs -  Physician Assistants and Nurse Practitioners) who all work together to provide you with the care you need, when you need it. You will need a follow up appointment in 12 months.  Please call our office 2 months in advance to schedule this appointment.  You may see Thomas Kelly, MD or one of the following Advanced Practice Providers on your designated Care Team: Hao Meng, PA-C . Angela Duke, PA-C       

## 2018-04-11 NOTE — Progress Notes (Signed)
Patient ID: Chloe Perkins, female   DOB: 03-15-1945, 73 y.o.   MRN: [id number]    Primary M.D.: Dr. Deborra Perkins  HPI: Chloe Perkins is a 73 y.o. female who presents to the office for an 8 month cardiology follow-up evaluation.  Chloe Perkins has a history of SVT and hypertension, for which she has been on diltiazem at 120 mg  and losartan 50 mg daily.  She has  also remote history of thyroid abnormality and in the past  had taken levothyroxine.  She has mild renal insufficiency with stage III renal disease.  Has a history of anxiety/depression for which she takes Wellbutrin XL 300 mg daily.  She also is on calcium with vitamin D in light of osteoporosis.  Recently, she believes that she has been experiencing more episodes of palpitations which are short-lived and at times occur daily.  She denies associated presyncope or syncope.  She denies chest pain.  She recently underwent a panoramix dental x-ray and was told by her dentist that she may have calcification of her carotids.   She has a history of osteoporosis, as well as vitamin B12 deficiency and has previously been found to have stable solitary pulmonary nodule.  She had a follow-up echo Doppler study in November 2016 which showed an ejection fraction at 60-65%.  She had normal diastolic parameters.  There was turbulent flow at the base of her aortic valve in the region of the right coronary cusp which was most likely the cause of her systolic murmur.    I last saw her, she was experiencing  transient palpitations lasting less than 5 seconds, almost on a daily basis.  She has been using caffeine.  She underwent a carotid duplex study in July 2017 which showed heterogeneous plaque bilaterally and 1-39% bilateral ICA stenoses with normal subclavian arteries, and patent vertebral arteries with antegrade flow.  I saw her, I further titrated her Cardizem and suggested that she take 240 mg at bedtime.  She's continued to take losartan 75 mg in  the morning.  Since I  saw her in February 2018, she states that she has continued to experience short-lived episodes of asymptomatic palpitations which may last anywhere from 5-10 seconds, but seemed to occur on a daily basis.  She has been on slow acting diltiazem at 240 mg daily and has continued to take losartan 75 mg daily for hypertension.  She also is on Ativan and Wellbutrin.   She underwent a CT scan by Dr. Deborra Perkins.  She was found to have a stable 1 cm nodule in the right lower lobe that was unchanged from previously.  Incidentally, she was also noted to have coronary artery calcification suggestive of CAD.  Once I found out about this, I had contacted her and recommended initiation of Crestor at an initial very low dose of 10 mg.  Since she was having almost daily palpitations when I saw her several weeks ago  I also attempted to add very low-dose metoprolol succinate at 12.5 mg.  Her heart rate had dropped into the upper 40s with this and she stopped taking it; however, toprol did improve her daily palpitations.    When I saw her in October 2018 I had a lengthy discussion with her regarding the fact that she has demonstration of subclinical atherosis in the importance of trying to induce plaque regression.  After much discussion, she agreed to initiate low-dose statin therapy.  She was started on low-dose Crestor.  Apparently,  she self discontinued this since she felt this caused her to be nauseated.  On for brief 14 2018.  Repeat laboratory off treatment showed a total cholesterol 2:15, HDL 87, LDL 114, and triglycerides 71.    When I saw her in March 2019 she had discontinued Crestor because of feeling nauseated which she attributed to Crestor and I initiated Zetia.  She saw Chloe Perkins on Jul 06, 2017 as an add-on for slow heart rate.  Heart rate on his exam was 48.  He decreased diltiazem back to 120 mg daily   Apparently, Chloe Perkins did not feel well on the 120 of diltiazem and for this  reason went back to take her previous 180 mg dose.  She had noticed her recent pressure elevation and also had noticed a mild headache.  She took the reduced dose ifor 5 days and then went back to 180 mg with feeling improved.     When I saw her on July 29, 2017 for follow-up her blood pressure was elevated on losartan 100 mg daily.  At that time I suggested she discontinue diltiazem and changed her to metoprolol tartrate 25 mg twice a day depending upon her heart rate.  Her ECG during that evaluation showed a bigeminal rhythm with alternating sinus and low atrial beats.  She was tolerating Zetia for hyperlipidemia in light of her documentation of subclinical atherosclerosis.  Apparently, she had called the office and was having issues with elevated blood pressure and slow heart rate.  During my absence, her metoprolol was stopped and she was started on amlodipine 5 mg daily and to use the metoprolol 12.5 mg on an as-needed basis.  She wore a  Monitor for 5 days saw Dr. Caryl Perkins for follow-up evaluation.  She is unaware of any recurrent episodes of tachycardia.  She denies presyncope or syncope.  Since I last saw her in July 2019 she has done well.  She denies any recurrent palpitations.  She is now on amlodipine 5 mg in addition to losartan 100 mg hypertension.  I had started her on Zetia for hyperlipidemia but apparently she is not taking this presently. She continues to take bupropion 450 mg for depression.  Unaware of any arrhythmia.  She denies presyncope or syncope.  She had seen Dr. Deborra Perkins for annual exam and laboratory from that date was reviewed.  Recent lipid panel in November 2019 was 89 with an HDL of 85.  She presents for evaluation   Past Medical History:  Diagnosis Date  . Anxiety and depression   . Arthritis   . Asthmatic bronchitis   . Bronchitis    history of  . Cancer ([place])    MELANOMA  . Depression   . Dysrhythmia   . Heart murmur   . Hemorrhoids   . History of melanoma     . HLD (hyperlipidemia)   . Hypertension   . Mitral valve prolapse   . Osteoporosis   . Personal history of malignant melanoma of skin   . SVT (supraventricular tachycardia) ([place])   . Viral gastroenteritis     Past Surgical History:  Procedure Laterality Date  . ABDOMINAL HYSTERECTOMY  1975  . CATARACT EXTRACTION W/PHACO Right 02/10/2017   Procedure: CATARACT EXTRACTION PHACO AND INTRAOCULAR LENS PLACEMENT (IOC);  Surgeon: Birder Robson, MD;  Location: [facility] ORS;  Service: Ophthalmology;  Laterality: Right;  [place] 00:35.9 AP% 11.5 CDE 4.11 Fluid Pack Lot # [id number]  . CATARACT EXTRACTION W/PHACO Left 03/09/2017   Procedure:  CATARACT EXTRACTION PHACO AND INTRAOCULAR LENS PLACEMENT (IOC);  Surgeon: Birder Robson, MD;  Location: [facility] ORS;  Service: Ophthalmology;  Laterality: Left;  [place] 00:23 AP% 16.3 CDE 3.85 Fluid pack lot # [id number]  . COLONOSCOPY  04/15/2004   2006: Normal  . ENDOMETRIAL ABLATION  1976  . left thumb tendon repair  10/09  . MELANOMA EXCISION  1999   superficial spreading melanoma right post shoulder  . NM MYOCAR PERF WALL MOTION  12/28/2008   No ischemia  . SKIN CANCER EXCISION  03/02/2013   superficial basal cell cancer  . US ECHOCARDIOGRAPHY  10/16/2010   mitral valve leaflets midly thickened,trace MR.  . WRIST SURGERY Left 2011    Allergies  Allergen Reactions  . Codeine Nausea And Vomiting  . Adhesive [Tape] Rash    PAPER TAPE ONLY (INTOLERANCE TO LEADS--EKG)  . Alendronate Sodium Other (See Comments)    REACTION: pt states INTOL to Fosamax  . Verapamil Other (See Comments)    REACTION: Intol w/ bradycardia    Current Outpatient Medications  Medication Sig Dispense Refill  . amLODipine (NORVASC) 5 MG tablet Take 1 tablet (5 mg total) by mouth daily. 90 tablet 3  . buPROPion (WELLBUTRIN XL) 150 MG 24 hr tablet Take 450 mg by mouth daily.    . calcium-vitamin D (OSCAL WITH D) 500-200 MG-UNIT per tablet Take 1 tablet by mouth 2 (two) times daily.       . cyanocobalamin (,VITAMIN B-12,) 1000 MCG/ML injection Inject 1,000 mcg into the muscle every 30 (thirty) days.    Marland Kitchen LORazepam (ATIVAN) 0.5 MG tablet Take 0.5 mg by mouth See admin instructions. TAKE 1 TABLET (0.5 MG) SCHEDULED IN THE MORNING & 1 TABLET (0.5 MG) IN THE EVENING AS NEEDED FOR ANXIETY.  5  . losartan (COZAAR) 100 MG tablet Take 1 tablet (100 mg total) by mouth daily. 90 tablet 3   No current facility-administered medications for this visit.     Social History   Socioeconomic History  . Marital status: Married    Spouse name: sammy [place]  . Number of children: 1  . Years of education: Not on file  . Highest education level: Not on file  Occupational History  . Occupation: Retired  Scientific laboratory technician  . Financial resource strain: Not on file  . Food insecurity:    Worry: Not on file    Inability: Not on file  . Transportation needs:    Medical: Not on file    Non-medical: Not on file  Tobacco Use  . Smoking status: Former Smoker    Packs/day: 0.50    Years: 20.00    Pack years: 10.00    Types: Cigarettes    Last attempt to quit: 02/09/1990    Years since quitting: 28.1  . Smokeless tobacco: Never Used  Substance and Sexual Activity  . Alcohol use: No    Alcohol/week: 0.0 standard drinks  . Drug use: No  . Sexual activity: Yes  Lifestyle  . Physical activity:    Days per week: Not on file    Minutes per session: Not on file  . Stress: Not on file  Relationships  . Social connections:    Talks on phone: Not on file    Gets together: Not on file    Attends religious service: Not on file    Active member of club or organization: Not on file    Attends meetings of clubs or organizations: Not on file    Relationship status:  Not on file  . Intimate partner violence:    Fear of current or ex partner: Not on file    Emotionally abused: Not on file    Physically abused: Not on file    Forced sexual activity: Not on file  Other Topics Concern  . Not on file   Social History Narrative   She lives her family, she has one child, retired, no smoke no drink    Family History  Problem Relation Age of Onset  . Emphysema Father        PGF, brother, sister  . COPD Father        PGF, brother, sister  . Heart disease Father   . Rheum arthritis Mother   . Stroke Mother   . Heart disease Mother        Father, brother, sister  . Lung cancer Paternal Grandfather   . Ovarian cancer Paternal Aunt   . Breast cancer Cousin   . Cancer Brother   . Heart disease Brother   . Heart disease Sister     ROS General: Negative; No fevers, chills, or night sweats;  HEENT: Negative; No changes in vision or hearing, sinus congestion, difficulty swallowing Pulmonary: Stable lung nodule Cardiovascular: see HPI GI: Negative; No nausea, vomiting, diarrhea, or abdominal pain GU: Negative; No dysuria, hematuria, or difficulty voiding Musculoskeletal: she complains of chronic back pain.  Hematologic/Oncology: Negative; no easy bruising, bleeding Endocrine: Negative; no heat/cold intolerance; no diabetes Neuro: Negative; no changes in balance, headaches Skin: Negative; No rashes or skin lesions Psychiatric: Positive for anxiety/depression on Wellbutrin Sleep: Negative; No snoring, daytime sleepiness, hypersomnolence, bruxism, restless legs, hypnogognic hallucinations, no cataplexy Other comprehensive 14 point system review is negative.   PE BP 122/72   Pulse (!) 57   Ht _0  (1.6 m)   Wt 110 lb 9.6 oz (50.2 kg)   BMI 19.59 kg/m    Repeat blood pressure was 122/70  Wt Readings from Last 3 Encounters:  04/11/18 110 lb 9.6 oz (50.2 kg)  12/07/17 109 lb 8 oz (49.7 kg)  10/25/17 109 lb (49.4 kg)   General: Alert, oriented, no distress.  Skin: normal turgor, no rashes, warm and dry HEENT: Normocephalic, atraumatic. Pupils equal round and reactive to light; sclera anicteric; extraocular muscles intact;  Nose without nasal septal hypertrophy Mouth/Parynx  benign; Mallinpatti scale 2 Neck: No JVD, no carotid bruits; normal carotid upstroke Lungs: clear to ausculatation and percussion; no wheezing or rales Chest wall: without tenderness to palpitation Heart: PMI not displaced, RRR, s1 s2 normal, 1/6 systolic murmur, no diastolic murmur, no rubs, gallops, thrills, or heaves Abdomen: soft, nontender; no hepatosplenomehaly, BS+; abdominal aorta nontender and not dilated by palpation. Back: no CVA tenderness Pulses 2+ Musculoskeletal: full range of motion, normal strength, no joint deformities Extremities: no clubbing cyanosis or edema, Homan's sign negative  Neurologic: grossly nonfocal; Cranial nerves grossly wnl Psychologic: Normal mood and affect   ECG (independently read by me): Sinus bradycardia with mild sinus arrhythmia, rate 51.  Low voltage.  Normal intervals.  No ectopy.  August 31, 2017 ECG (independently read by me): Normal sinus rhythm 80 bpm.  No ectopy.  Mild RV conduction delay.  Normal intervals.  July 29, 2017 ECG (independently read by me): Bigeminy rhythm with alternating sinus and low atrial beat heart rate 60 bpm.  May 05, 2017 ECG (independently read by me): normal sinus rhythm at 62 with a PAC.  Low voltage.  Normal intervals.  October 2018 ECG (  independently read by me): Sinus rhythm at 65 bpm, PAC.  PR interval 128 ms, QTc interval 428 ms.  September 2018 ECG (independently read by me): Normal sinus rhythm at 64 bpm, PA-C, mild RV conduction delay.  Nonspecific ST changes.  February 2017 ECG (independently read by me): Sinus bradycardia 53 bpm..  Mild RV conduction delay.  Normal intervals.  November 2017 ECG (independently read by me): Normal sinus rhythm at 61 bpm.  No ectopy.  Normal intervals.  July 2017 ECG (independently read by me): Sinus rhythm at 60 bpm with PAC.  Normal intervals.  November 2016 ECG (independently read by me): Normal sinus rhythm at 66 bpm.  Mild RV conduction delay.  Nondiagnostic small  inferior Q waves.  ECG (independently read by me): sinus bradycardia 50 bpm.  Mild RV conduction delay  November 2015 ECG (independently read by me0;  Normal sinus rhythm at 77 bpm.  RV conduction delay.  Nondiagnostic small Q waves.  Nonspecific ST changes.  April 2015 ECG (independently read by me): Sinus bradycardia 54 beats per minute.  Mild RV conduction delay.  No ectopy.  LABS: BMP Latest Ref Rng & Units 12/16/2017 05/05/2017 03/25/2016  Glucose 70 - 99 mg/dL 94 98 95  BUN 6 - 23 mg/dL _0 Creatinine 0.40 - 1.20 mg/dL 1.01 1.07(H) 1.07(H)  Sodium 135 - 145 mEq/L 140 139 139  Potassium 3.5 - 5.1 mEq/L 4.6 4.4 4.2  Chloride 96 - 112 mEq/L 103 106 106  CO2 19 - 32 mEq/L _1 Calcium 8.4 - 10.5 mg/dL 10.7(H) 9.2 9.5   Hepatic Function Latest Ref Rng & Units 12/16/2017 05/05/2017 03/25/2016  Total Protein 6.0 - 8.3 g/dL 7.4 7.3 7.2  Albumin 3.5 - 5.2 g/dL 4.6 4.2 4.4  AST 0 - 37 U/L _2 ALT 0 - 35 U/L 13 13(L) 14  Alk Phosphatase 39 - 117 U/L 60 64 73  Total Bilirubin 0.2 - 1.2 mg/dL 0.5 0.6 0.6  Bilirubin, Direct 0.0 - 0.3 mg/dL - - -   CBC Latest Ref Rng & Units 03/25/2016 12/11/2014 11/28/2014  WBC 3.6 - 11.0 K/uL 3.4(L) 5.0 3.3  Hemoglobin 12.0 - 16.0 g/dL 13.8 14.3 14.3  Hematocrit 35.0 - 47.0 % 40.5 42.5 41  Platelets 150 - 440 K/uL 170 229 202   Lab Results  Component Value Date   MCV 95.1 03/25/2016   MCV 95.5 12/11/2014   MCV 94.7 05/04/2013   Lab Results  Component Value Date   TSH 4.32 12/16/2017  No results found for: HGBA1C  Lipid Panel     Component Value Date/Time   CHOL 191 12/16/2017 1346   CHOL 203 (H) 05/22/2011 0759   TRIG 83.0 12/16/2017 1346   TRIG 65 05/22/2011 0759   HDL 85.40 12/16/2017 1346   HDL 88 (H) 05/22/2011 0759   CHOLHDL 2 12/16/2017 1346   VLDL 16.6 12/16/2017 1346   VLDL 13 05/22/2011 0759   LDLCALC 89 12/16/2017 1346   LDLCALC 102 (H) 05/22/2011 0759   LDLDIRECT 102.2 04/23/2006 1211     RADIOLOGY: No  results found.  IMPRESSION:  1. Essential hypertension   2. SVT (supraventricular tachycardia) ([place])   3. Hyperlipidemia with target LDL less than 70   4. Anxiety and depression   5. Carotid artery plaque, unspecified laterality     ASSESSMENT AND PLAN: [name] is a 73 year old female who has a history of SVT as well as hypertension. Remotely  she  had experiencing more frequent palpitations which led to further dose escalation of Cardizem up to 240 mg with improvement in due to continued blood pressure issues losartan was titrated up to 100 mg.  She had developed bradycardia and an attempt was made to reduce diltiazem but she felt this contributed to her increased blood pressure and did not feel well.  When last seen a trial of low-dose metoprolol was initiated and subsequently this has been discontinued.  Diltiazem was subsequently discontinued and blood pressure improved  with initiation of amlodipine 5 mg in addition to her losartan 100 mg.  She has seen Dr. Caryl Perkins who reviewed the results of her 14-day patch monitor which did not reveal any significant ectopy or pauses.  Her blood pressure today is now controlled on amlodipine 5 mg in addition to losartan 100 mg.  She continues to be on bupropion 4 to 50 mg daily for depression.  This seems to be controlling her well.  She has mild residual sinus bradycardia on ECG today.  QTc interval was normal at 394 and PR interval 144 ms.  Her lipid studies have improved and apparently she is not taking Zetia.  He has previous documentation of mild carotid plaque.  She will continue her current therapy.  I will see her in 1 year for reevaluation  Time spent: 25 minutes  Troy Sine, MD, [facility]  04/11/2018 6:36 PM

## 2018-05-03 ENCOUNTER — Ambulatory Visit: Payer: Medicare Other

## 2018-07-06 ENCOUNTER — Ambulatory Visit (INDEPENDENT_AMBULATORY_CARE_PROVIDER_SITE_OTHER): Payer: Medicare Other | Admitting: *Deleted

## 2018-07-06 ENCOUNTER — Other Ambulatory Visit: Payer: Self-pay

## 2018-07-06 DIAGNOSIS — E538 Deficiency of other specified B group vitamins: Secondary | ICD-10-CM | POA: Diagnosis not present

## 2018-07-06 MED ORDER — CYANOCOBALAMIN 1000 MCG/ML IJ SOLN
1000.0000 ug | Freq: Once | INTRAMUSCULAR | Status: AC
  Administered 2018-07-06: 1000 ug via INTRAMUSCULAR

## 2018-07-06 NOTE — Progress Notes (Signed)
Patient presented for B 12 injection to right deltoid, patient voiced no concerns nor showed any signs of distress during injection. 

## 2018-07-11 ENCOUNTER — Ambulatory Visit: Payer: Self-pay | Admitting: *Deleted

## 2018-07-11 ENCOUNTER — Ambulatory Visit (INDEPENDENT_AMBULATORY_CARE_PROVIDER_SITE_OTHER): Payer: Medicare Other | Admitting: Internal Medicine

## 2018-07-11 ENCOUNTER — Telehealth: Payer: Self-pay

## 2018-07-11 ENCOUNTER — Encounter: Payer: Self-pay | Admitting: Internal Medicine

## 2018-07-11 ENCOUNTER — Other Ambulatory Visit: Payer: Self-pay

## 2018-07-11 DIAGNOSIS — R911 Solitary pulmonary nodule: Secondary | ICD-10-CM

## 2018-07-11 DIAGNOSIS — N183 Chronic kidney disease, stage 3 unspecified: Secondary | ICD-10-CM

## 2018-07-11 DIAGNOSIS — M546 Pain in thoracic spine: Secondary | ICD-10-CM

## 2018-07-11 MED ORDER — TIZANIDINE HCL 4 MG PO TABS: 4.0000 mg | ORAL_TABLET | Freq: Four times a day (QID) | ORAL | 0 refills | Status: DC | PRN

## 2018-07-11 NOTE — Telephone Encounter (Addendum)
Contacted pt regarding back spasms which started  On 07/09/2018; the pain is in her upper back towards the middle of her shoulder; she has taken ibuprofen 800 mg q 8 hours since 07/09/2018 with no relief in symptoms; the pt says that she can not sit or lay down because of the pain; she rates her pain at 9 out of 10; her pain is constant; recommendations made per nurse triage protocol; she verbalizes underderstanding;pt normally sees Dr Derrel Nip, [facility]; pt transferred to [facility]  for scheduling; will route to office for notification.  Reason for Disposition . [1] SEVERE back pain (e.g., excruciating, unable to do any normal activities) AND [2] not improved 2 hours after pain medicine  Answer Assessment - Initial Assessment Questions 1. ONSET: "When did the pain begin?"      07/09/2018 2. LOCATION: "Where does it hurt?" (upper, mid or lower back)     Upper back toward shoulder 3. SEVERITY: "How bad is the pain?"  (e.g., Scale 1-10; mild, moderate, or severe)   - MILD (1-3): doesn't interfere with normal activities    - MODERATE (4-7): interferes with normal activities or awakens from sleep    - SEVERE (8-10): excruciating pain, unable to do any normal activities      9 out of 10 4. PATTERN: "Is the pain constant?" (e.g., yes, no; constant, intermittent)     constant 5. RADIATION: "Does the pain shoot into your legs or elsewhere?"     no 6. CAUSE:  "What do you think is causing the back pain?"      Muscle spasm 7. BACK OVERUSE:  "Any recent lifting of heavy objects, strenuous work or exercise?"   no 8. MEDICATIONS: "What have you taken so far for the pain?" (e.g., nothing, acetaminophen, NSAIDS)    ibuprofen 800 mg q hrs since 07/09/2018 9. NEUROLOGIC SYMPTOMS: "Do you have any weakness, numbness, or problems with bowel/bladder control?"     no 10. OTHER SYMPTOMS: "Do you have any other symptoms?" (e.g., fever, abdominal pain, burning with urination, blood in urine)       no 11. PREGNANCY:  "Is there any chance you are pregnant?" (e.g., yes, no; LMP)       No menopause  Protocols used: BACK PAIN-A-AH

## 2018-07-11 NOTE — Patient Instructions (Signed)
SUSPEND THE IBUPROFEN FOR A DAY  YOU CAN RESUME AT A LOWER DOSE :  400 MG EVERY 6 HOURS AS NEEDED  You can add up to 2000 mg of acetominophen (tylenol) every day safely  In divided doses (500 mg every 6 hours  Or 1000 mg every 12 hours.)  I HAVE PRESCRIBED TIZANIDINE , a muscle relaxer, to add every 6 hours as needed  If your pain has not improved in a few days,  We should rule out a vertebral fracture since you have osteoporosis.  This can be done with a thoracic spine x ray we can do in our office.

## 2018-07-11 NOTE — Progress Notes (Signed)
Telephone  Note  This visit type was conducted due to national recommendations for restrictions regarding the COVID-19 pandemic (e.g. social distancing).  This format is felt to be most appropriate for this patient at this time.  All issues noted in this document were discussed and addressed.  No physical exam was performed (except for noted visual exam findings with Video Visits).   I connected with@ on 07/11/18 at  4:00 PM EDT by elephone and verified that I am speaking with the correct person using two identifiers. Location patient: home Location provider: work or home office Persons participating in the virtual visit: patient, provider  I discussed the limitations, risks, security and privacy concerns of performing an evaluation and management service by telephone and the availability of in person appointments. I also discussed with the patient that there may be a patient responsible charge related to this service. The patient expressed understanding and agreed to proceed.   Reason for visit: mid thoracic back pain   HPI  73 yr old female with history of OSTEOPOROSIS AND DDD SPINE presents with 2 day history of mid to lower thoracic  back pain. STARTED AFTER CARRYING A HEAVY LAUNDRY BASKET DOWN THE STAIRS to HER LAUNDRY ROOM.  She has no recent history of falls .  She has been taking excessive amounts of ibuprofen with no significant change in pain since Saturday and is requesting a muscle relaxer.  She denies dark urine,  Fevers,  Change in bowel habits.    The patient has no signs or symptoms of COVID 19 infection (fever, cough, sore throat  or shortness of breath beyond what is typical for patient).  Patient denies contact with other persons with the above mentioned symptoms or with anyone confirmed to have COVID 19 .  She has been minimizing her contact with the public and using a mask and hand sanitizer when she comes into any contact with the public.     ROS: See pertinent positives  and negatives per HPI.  Past Medical History:  Diagnosis Date  . Anxiety and depression   . Arthritis   . Asthmatic bronchitis   . Bronchitis    history of  . Cancer ([facility])    MELANOMA  . Depression   . Dysrhythmia   . Heart murmur   . Hemorrhoids   . History of melanoma   . HLD (hyperlipidemia)   . Hypertension   . Mitral valve prolapse   . Osteoporosis   . Personal history of malignant melanoma of skin   . SVT (supraventricular tachycardia) ([place])   . Viral gastroenteritis     Past Surgical History:  Procedure Laterality Date  . ABDOMINAL HYSTERECTOMY  1975  . CATARACT EXTRACTION W/PHACO Right 02/10/2017   Procedure: CATARACT EXTRACTION PHACO AND INTRAOCULAR LENS PLACEMENT (IOC);  Surgeon: Birder Robson, MD;  Location: [facility] ORS;  Service: Ophthalmology;  Laterality: Right;  [place] 00:35.9 AP% 11.5 CDE 4.11 Fluid Pack Lot # [id number]  . CATARACT EXTRACTION W/PHACO Left 03/09/2017   Procedure: CATARACT EXTRACTION PHACO AND INTRAOCULAR LENS PLACEMENT (IOC);  Surgeon: Birder Robson, MD;  Location: [facility] ORS;  Service: Ophthalmology;  Laterality: Left;  [place] 00:23 AP% 16.3 CDE 3.85 Fluid pack lot # [id number]  . COLONOSCOPY  04/15/2004   2006: Normal  . ENDOMETRIAL ABLATION  1976  . left thumb tendon repair  10/09  . MELANOMA EXCISION  1999   superficial spreading melanoma right post shoulder  . NM MYOCAR PERF WALL MOTION  12/28/2008   No  ischemia  . SKIN CANCER EXCISION  03/02/2013   superficial basal cell cancer  . US ECHOCARDIOGRAPHY  10/16/2010   mitral valve leaflets midly thickened,trace MR.  . WRIST SURGERY Left 2011    Family History  Problem Relation Age of Onset  . Emphysema Father        PGF, brother, sister  . COPD Father        PGF, brother, sister  . Heart disease Father   . Rheum arthritis Mother   . Stroke Mother   . Heart disease Mother        Father, brother, sister  . Lung cancer Paternal Grandfather   . Ovarian cancer Paternal Aunt   . Breast  cancer Cousin   . Cancer Brother   . Heart disease Brother   . Heart disease Sister     SOCIAL HX:  reports that she quit smoking about 28 years ago. Her smoking use included cigarettes. She has a 10.00 pack-year smoking history. She has never used smokeless tobacco. She reports that she does not drink alcohol or use drugs.   Current Outpatient Medications:  .  buPROPion (WELLBUTRIN XL) 150 MG 24 hr tablet, Take 450 mg by mouth daily., Disp: , Rfl:  .  calcium-vitamin D (OSCAL WITH D) 500-200 MG-UNIT per tablet, Take 1 tablet by mouth 2 (two) times daily.  , Disp: , Rfl:  .  cyanocobalamin (,VITAMIN B-12,) 1000 MCG/ML injection, Inject 1,000 mcg into the muscle every 30 (thirty) days., Disp: , Rfl:  .  LORazepam (ATIVAN) 0.5 MG tablet, Take 0.5 mg by mouth See admin instructions. TAKE 1 TABLET (0.5 MG) SCHEDULED IN THE MORNING & 1 TABLET (0.5 MG) IN THE EVENING AS NEEDED FOR ANXIETY., Disp: , Rfl: 5 .  losartan (COZAAR) 100 MG tablet, Take 1 tablet (100 mg total) by mouth daily., Disp: 90 tablet, Rfl: 3 .  tiZANidine (ZANAFLEX) 4 MG tablet, Take 1 tablet (4 mg total) by mouth every 6 (six) hours as needed for muscle spasms., Disp: 30 tablet, Rfl: 0 .  amLODipine (NORVASC) 5 MG tablet, Take 1 tablet (5 mg total) by mouth daily., Disp: 90 tablet, Rfl: 3  EXAM:   General impression: alert, cooperative and articulate.  No signs of being in distress  Lungs: speech is fluent sentence length suggests that patient is not short of breath and not punctuated by cough, sneezing or sniffing. Marland Kitchen   Psych: affect normal.  speech is articulate and non pressured .  Denies suicidal thoughts   ASSESSMENT AND PLAN:  Discussed the following assessment and plan:  No diagnosis found.  No problem-specific Assessment & Plan notes found for this encounter.    I discussed the assessment and treatment plan with the patient. The patient was provided an opportunity to ask questions and all were answered. The  patient agreed with the plan and demonstrated an understanding of the instructions.   The patient was advised to call back or seek an in-person evaluation if the symptoms worsen or if the condition fails to improve as anticipated.  I provided 21 minutes of non-face-to-face time during this encounter.   [name], MD

## 2018-07-11 NOTE — Telephone Encounter (Signed)
Generic zanaflex sent to pharmacy

## 2018-07-11 NOTE — Telephone Encounter (Signed)
I called and scheduled a telephone visit with you this afternoon at 4pm.  I sent you a message earlier, pt wanted muscle relaxants sent in to pharmacy, I informed her to schedule an appt.  Nina,cma

## 2018-07-12 NOTE — Telephone Encounter (Signed)
Called and informed pt medication was sent to pharmacy. Pt understood.  Nina,cma

## 2018-07-12 NOTE — Assessment & Plan Note (Signed)
Will treat as muscle strain .  Tizanidine added to tylenol.  If no improvement in 48 hours advised to come in for films to rule out vertebral fracture

## 2018-07-12 NOTE — Assessment & Plan Note (Signed)
Stable 1 cm RLL nodule has been present since 2007.  No follow up needed per Radiology

## 2018-07-12 NOTE — Assessment & Plan Note (Signed)
Repeat labs have shown no change in Cr  despite use of NSAIDs.  Not used regularly,  But she is ude for 6 month follow up.  Lab Results  Component Value Date   CREATININE 1.01 12/16/2017   Lab Results  Component Value Date   NA 140 12/16/2017   K 4.6 12/16/2017   CL 103 12/16/2017   CO2 29 12/16/2017

## 2018-07-13 ENCOUNTER — Telehealth: Payer: Self-pay | Admitting: *Deleted

## 2018-07-13 NOTE — Telephone Encounter (Signed)
Spoke with pt's husband(on DPR) and he stated that the muscle relaxer and tylenol that the pt is taking is not helping with her back pain at all. Pt's husband stated that for the last thirty minutes she has been up walking around because the pain is so bad. Spoke with Dr. Derrel Nip verbally and she stated that she will not call in anything else for pain until the pt has the xray done. Called and spoke with pt's husband in regards to Dr. Lupita Dawn verbal order.

## 2018-07-13 NOTE — Telephone Encounter (Signed)
Copied from [place] [id number]. Topic: General - Other >> Jul 13, 2018  3:26 PM Alanda Slim E wrote: Reason for CRM: Pt is not feeling any better and the muscle relaxer's are not working. Pt is needing something for the back pain and also wants to know when she should come in for an xray since she is feeling no better/ Pt would like a call back today/ please advise

## 2018-07-25 ENCOUNTER — Other Ambulatory Visit: Payer: Self-pay

## 2018-07-26 ENCOUNTER — Ambulatory Visit: Payer: Medicare Other | Admitting: Family Medicine

## 2018-08-10 ENCOUNTER — Ambulatory Visit: Payer: Medicare Other

## 2018-08-16 ENCOUNTER — Other Ambulatory Visit: Payer: Self-pay

## 2018-08-16 ENCOUNTER — Ambulatory Visit (INDEPENDENT_AMBULATORY_CARE_PROVIDER_SITE_OTHER): Payer: Medicare Other

## 2018-08-16 DIAGNOSIS — E538 Deficiency of other specified B group vitamins: Secondary | ICD-10-CM | POA: Diagnosis not present

## 2018-08-16 MED ORDER — CYANOCOBALAMIN 1000 MCG/ML IJ SOLN
1000.0000 ug | Freq: Once | INTRAMUSCULAR | Status: AC
  Administered 2018-08-16: 1000 ug via INTRAMUSCULAR

## 2018-08-16 NOTE — Progress Notes (Signed)
Patient presented today for B12 injection.  Administered IM in left deltoid.  Administered without incident.  Patient tolerated well with no signs of distress.

## 2018-09-05 ENCOUNTER — Encounter: Payer: Self-pay | Admitting: *Deleted

## 2018-09-20 ENCOUNTER — Ambulatory Visit: Payer: Medicare Other

## 2018-09-21 ENCOUNTER — Encounter: Payer: Self-pay | Admitting: Family Medicine

## 2018-09-21 ENCOUNTER — Ambulatory Visit (INDEPENDENT_AMBULATORY_CARE_PROVIDER_SITE_OTHER): Payer: Medicare Other | Admitting: Family Medicine

## 2018-09-21 ENCOUNTER — Other Ambulatory Visit: Payer: Self-pay

## 2018-09-21 DIAGNOSIS — M546 Pain in thoracic spine: Secondary | ICD-10-CM

## 2018-09-21 MED ORDER — BACLOFEN 10 MG PO TABS: 5.0000 mg | ORAL_TABLET | Freq: Three times a day (TID) | ORAL | 1 refills | Status: DC | PRN

## 2018-09-21 MED ORDER — NABUMETONE 750 MG PO TABS: 750.0000 mg | ORAL_TABLET | Freq: Two times a day (BID) | ORAL | 6 refills | Status: DC | PRN

## 2018-09-21 MED ORDER — PREDNISONE 10 MG PO TABS: ORAL_TABLET | ORAL | 0 refills | Status: DC

## 2018-09-21 NOTE — Progress Notes (Signed)
Office Visit Note   Patient: Chloe Perkins           Date of Birth: [birth year]           MRN: [id number] Visit Date: 09/21/2018 Requested by: [name], MD [address] [place],  [place] [id number] PCP: [name], MD  Subjective: Chief Complaint  Patient presents with  . Middle Back - Pain    Pain in left side of middle portion of back and around to the right flank x 4 days. NKI, but has had this before.    HPI: She is here with right-sided thoracic pain.  Symptoms started in June, no injury.  She began having pain along the shoulder blade and further down.  The pain hurts with any movement.  She was given tramadol which did not help.  She states that she had x-rays but I was unable to see them in the system.  She was told she might have arthritis.  Pain does not radiate to the arms or legs.  No bowel or bladder dysfunction.  No fevers or chills.  No night sweats or unintentional weight change.              ROS:   All other systems were reviewed and are negative.  Objective: Vital Signs: There were no vitals taken for this visit.  Physical Exam:  General:  Alert and oriented, in no acute distress. Pulm:  Breathing unlabored. Psy:  Normal mood, congruent affect. Skin: No rash on her skin. Back: She has no point tenderness over her thoracic or lumbar spinous processes.  She has multiple tender trigger points on the right side, and the region of the scapula and further down toward the lower ribs.   Imaging: None today.  Assessment & Plan: 1.  Possible myofascial thoracic pain -Prednisone taper followed by Relafen as needed.  Baclofen as needed for muscle spasm. -Referral to physical therapy for myofascial release techniques. -If she fails to improve we will proceed with thoracic MRI scan.     Procedures: No procedures performed  No notes on file     PMFS History: Patient Active Problem List   Diagnosis Date Noted  . Weight loss 12/08/2017   . Urinary hesitancy 10/25/2017  . Tachycardia-bradycardia syndrome ([place]) 07/06/2017  . CAD (coronary artery disease), native coronary artery 07/06/2017  . Acute bilateral thoracic back pain 12/18/2016  . 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 Solitary pulmonary nodule 05/04/2015  . B12 deficiency 05/04/2015  . Orthostasis 03/19/2015  . Dizziness and giddiness 03/19/2015  . Encounter for preventive health examination 03/19/2015  . S/P hysterectomy 06/26/2013  . CKD (chronic kidney disease) stage 3, GFR 30-59 ml/min ([facility]) 06/26/2013  . Hypothyroidism 06/26/2013  . SVT (supraventricular tachycardia) ([place]) 05/29/2013  . Mitral valve prolapse   . HLD (hyperlipidemia)   . Anxiety and depression 09/03/2010  . IBS (irritable bowel syndrome) 09/03/2010  . HYPERCHOLESTEROLEMIA, BORDERLINE 02/06/2008  . Essential hypertension 02/06/2008  . Osteoporosis 02/06/2008  . PERSONAL HISTORY OF MALIGNANT MELANOMA OF SKIN 02/06/2008   Past Medical History:  Diagnosis Date  . Anxiety and depression   . Arthritis   . Asthmatic bronchitis   . Atypical nevus 01/27/2011   right side abdomen-severe atypia  . Atypical nevus 11/09/2012   moderate atypia-left upper back  . Atypical nevus 11/19/2012   moderate atypia-left forearm  . BCC (basal cell carcinoma) 10/23/2002   left bridge nose-Bcc with sclerosis  . Bronchitis  history of  . Cancer ([place])    MELANOMA  . Depression   . Dysrhythmia   . Heart murmur   . Hemorrhoids   . History of melanoma   . HLD (hyperlipidemia)   . Hypertension   . Melanoma ([place]) 10/18/1997   right post sholder-melanoma level 1  . Mitral valve prolapse   . Osteoporosis   . Personal history of malignant melanoma of skin   . Superficial basal cell carcinoma ([facility]) 01/10/2013   right cheek  . SVT (supraventricular tachycardia) ([place])   . Viral gastroenteritis     Family History  Problem Relation Age of Onset  . Emphysema Father        PGF, brother, sister   . COPD Father        PGF, brother, sister  . Heart disease Father   . Rheum arthritis Mother   . Stroke Mother   . Heart disease Mother        Father, brother, sister  . Lung cancer Paternal Grandfather   . Ovarian cancer Paternal Aunt   . Breast cancer Cousin   . Cancer Brother   . Heart disease Brother   . Heart disease Sister     Past Surgical History:  Procedure Laterality Date  . ABDOMINAL HYSTERECTOMY  1975  . CATARACT EXTRACTION W/PHACO Right 02/10/2017   Procedure: CATARACT EXTRACTION PHACO AND INTRAOCULAR LENS PLACEMENT (IOC);  Surgeon: Birder Robson, MD;  Location: [facility] ORS;  Service: Ophthalmology;  Laterality: Right;  [place] 00:35.9 AP% 11.5 CDE 4.11 Fluid Pack Lot # [id number]  . CATARACT EXTRACTION W/PHACO Left 03/09/2017   Procedure: CATARACT EXTRACTION PHACO AND INTRAOCULAR LENS PLACEMENT (IOC);  Surgeon: Birder Robson, MD;  Location: [facility] ORS;  Service: Ophthalmology;  Laterality: Left;  [place] 00:23 AP% 16.3 CDE 3.85 Fluid pack lot # [id number]  . COLONOSCOPY  04/15/2004   2006: Normal  . ENDOMETRIAL ABLATION  1976  . left thumb tendon repair  10/09  . MELANOMA EXCISION  1999   superficial spreading melanoma right post shoulder  . NM MYOCAR PERF WALL MOTION  12/28/2008   No ischemia  . SKIN CANCER EXCISION  03/02/2013   superficial basal cell cancer  . US ECHOCARDIOGRAPHY  10/16/2010   mitral valve leaflets midly thickened,trace MR.  . WRIST SURGERY Left 2011   Social History   Occupational History  . Occupation: Retired  Tobacco Use  . Smoking status: Former Smoker    Packs/day: 0.50    Years: 20.00    Pack years: 10.00    Types: Cigarettes    Quit date: 02/09/1990    Years since quitting: 28.6  . Smokeless tobacco: Never Used  Substance and Sexual Activity  . Alcohol use: No    Alcohol/week: 0.0 standard drinks  . Drug use: No  . Sexual activity: Yes

## 2018-10-03 ENCOUNTER — Emergency Department
Admission: EM | Admit: 2018-10-03 | Discharge: 2018-10-03 | Disposition: A | Discharge status: Home / Self Care | Payer: No Typology Code available for payment source | Attending: Emergency Medicine | Admitting: Emergency Medicine

## 2018-10-03 ENCOUNTER — Other Ambulatory Visit: Payer: Self-pay

## 2018-10-03 ENCOUNTER — Encounter: Payer: Self-pay | Admitting: Emergency Medicine

## 2018-10-03 DIAGNOSIS — Z85828 Personal history of other malignant neoplasm of skin: Secondary | ICD-10-CM | POA: Diagnosis not present

## 2018-10-03 DIAGNOSIS — I10 Essential (primary) hypertension: Secondary | ICD-10-CM | POA: Insufficient documentation

## 2018-10-03 DIAGNOSIS — Z79899 Other long term (current) drug therapy: Secondary | ICD-10-CM | POA: Diagnosis not present

## 2018-10-03 DIAGNOSIS — Y999 Unspecified external cause status: Secondary | ICD-10-CM | POA: Insufficient documentation

## 2018-10-03 DIAGNOSIS — M7918 Myalgia, other site: Secondary | ICD-10-CM | POA: Insufficient documentation

## 2018-10-03 DIAGNOSIS — Z87891 Personal history of nicotine dependence: Secondary | ICD-10-CM | POA: Insufficient documentation

## 2018-10-03 DIAGNOSIS — Y939 Activity, unspecified: Secondary | ICD-10-CM | POA: Diagnosis not present

## 2018-10-03 DIAGNOSIS — S0990XA Unspecified injury of head, initial encounter: Secondary | ICD-10-CM | POA: Diagnosis present

## 2018-10-03 DIAGNOSIS — I2510 Atherosclerotic heart disease of native coronary artery without angina pectoris: Secondary | ICD-10-CM | POA: Insufficient documentation

## 2018-10-03 DIAGNOSIS — S0003XA Contusion of scalp, initial encounter: Secondary | ICD-10-CM | POA: Insufficient documentation

## 2018-10-03 DIAGNOSIS — Y92411 Interstate highway as the place of occurrence of the external cause: Secondary | ICD-10-CM | POA: Diagnosis not present

## 2018-10-03 NOTE — Discharge Instructions (Addendum)
Please take Tylenol 1000 mg every 6 hours as needed for pain.  You may use your muscle relaxers for previous muscle spasms as prescribed as needed for pain.  If no improvement in 2 to 3 days, would recommend following up with PCP.  Return to the ER for any worsening symptoms or urgent changes in your health
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
## 2018-11-30 ENCOUNTER — Ambulatory Visit (INDEPENDENT_AMBULATORY_CARE_PROVIDER_SITE_OTHER): Payer: Medicare Other

## 2018-11-30 ENCOUNTER — Other Ambulatory Visit: Payer: Self-pay

## 2018-11-30 DIAGNOSIS — E538 Deficiency of other specified B group vitamins: Secondary | ICD-10-CM

## 2018-11-30 MED ORDER — CYANOCOBALAMIN 1000 MCG/ML IJ SOLN
1000.0000 ug | Freq: Once | INTRAMUSCULAR | Status: AC
  Administered 2018-11-30: 1000 ug via INTRAMUSCULAR

## 2018-11-30 NOTE — Progress Notes (Signed)
Patient presented today for B12 injection.  Administered IM in left deltoid.  Patient tolerated well with no signs of distress.   

## 2018-12-11 NOTE — Progress Notes (Deleted)
[name], MD   No chief complaint on file.   HPI:      [name] is a 73 y.o. G1P1001 who LMP was No LMP recorded. Patient has had a hysterectomy., presents today for *** Urinary hesitance last yr with Dr. Kenton Kingfisher, has Vag atrophy   Patient Active Problem List   Diagnosis Date Noted  . Weight loss 12/08/2017  . Urinary hesitancy 10/25/2017  . Tachycardia-bradycardia syndrome ([place]) 07/06/2017  . CAD (coronary artery disease), native coronary artery 07/06/2017  . Acute bilateral thoracic back pain 12/18/2016  . 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 Solitary pulmonary nodule 05/04/2015  . B12 deficiency 05/04/2015  . Orthostasis 03/19/2015  . Dizziness and giddiness 03/19/2015  . Encounter for preventive health examination 03/19/2015  . S/P hysterectomy 06/26/2013  . CKD (chronic kidney disease) stage 3, GFR 30-59 ml/min 06/26/2013  . Hypothyroidism 06/26/2013  . SVT (supraventricular tachycardia) ([place]) 05/29/2013  . Mitral valve prolapse   . HLD (hyperlipidemia)   . Anxiety and depression 09/03/2010  . IBS (irritable bowel syndrome) 09/03/2010  . HYPERCHOLESTEROLEMIA, BORDERLINE 02/06/2008  . Essential hypertension 02/06/2008  . Osteoporosis 02/06/2008  . PERSONAL HISTORY OF MALIGNANT MELANOMA OF SKIN 02/06/2008    Past Surgical History:  Procedure Laterality Date  . ABDOMINAL HYSTERECTOMY  1975  . CATARACT EXTRACTION W/PHACO Right 02/10/2017   Procedure: CATARACT EXTRACTION PHACO AND INTRAOCULAR LENS PLACEMENT (IOC);  Surgeon: Birder Robson, MD;  Location: [facility] ORS;  Service: Ophthalmology;  Laterality: Right;  [place] 00:35.9 AP% 11.5 CDE 4.11 Fluid Pack Lot # [id number]  . CATARACT EXTRACTION W/PHACO Left 03/09/2017   Procedure: CATARACT EXTRACTION PHACO AND INTRAOCULAR LENS PLACEMENT (IOC);  Surgeon: Birder Robson, MD;  Location: [facility] ORS;  Service: Ophthalmology;  Laterality: Left;  [place] 00:23 AP% 16.3 CDE 3.85 Fluid pack lot # [id number]   . COLONOSCOPY  04/15/2004   2006: Normal  . ENDOMETRIAL ABLATION  1976  . left thumb tendon repair  10/09  . MELANOMA EXCISION  1999   superficial spreading melanoma right post shoulder  . NM MYOCAR PERF WALL MOTION  12/28/2008   No ischemia  . SKIN CANCER EXCISION  03/02/2013   superficial basal cell cancer  . US ECHOCARDIOGRAPHY  10/16/2010   mitral valve leaflets midly thickened,trace MR.  . WRIST SURGERY Left 2011    Family History  Problem Relation Age of Onset  . Emphysema Father        PGF, brother, sister  . COPD Father        PGF, brother, sister  . Heart disease Father   . Rheum arthritis Mother   . Stroke Mother   . Heart disease Mother        Father, brother, sister  . Lung cancer Paternal Grandfather   . Ovarian cancer Paternal Aunt   . Breast cancer Cousin   . Cancer Brother   . Heart disease Brother   . Heart disease Sister     Social History   Socioeconomic History  . Marital status: Married    Spouse name: sammy Pilot  . Number of children: 1  . Years of education: Not on file  . Highest education level: Not on file  Occupational History  . Occupation: Retired  Scientific laboratory technician  . Financial resource strain: Not on file  . Food insecurity    Worry: Not on file    Inability: Not on file  . Transportation needs    Medical: Not on file  Non-medical: Not on file  Tobacco Use  . Smoking status: Former Smoker    Packs/day: 0.50    Years: 20.00    Pack years: 10.00    Types: Cigarettes    Quit date: 02/09/1990    Years since quitting: 28.8  . Smokeless tobacco: Never Used  Substance and Sexual Activity  . Alcohol use: No    Alcohol/week: 0.0 standard drinks  . Drug use: No  . Sexual activity: Yes  Lifestyle  . Physical activity    Days per week: Not on file    Minutes per session: Not on file  . Stress: Not on file  Relationships  . Social Herbalist on phone: Not on file    Gets together: Not on file    Attends religious  service: Not on file    Active member of club or organization: Not on file    Attends meetings of clubs or organizations: Not on file    Relationship status: Not on file  . Intimate partner violence    Fear of current or ex partner: Not on file    Emotionally abused: Not on file    Physically abused: Not on file    Forced sexual activity: Not on file  Other Topics Concern  . Not on file  Social History Narrative   She lives her family, she has one child, retired, no smoke no drink    Outpatient Medications Prior to Visit  Medication Sig Dispense Refill  . amLODipine (NORVASC) 5 MG tablet Take 1 tablet (5 mg total) by mouth daily. 90 tablet 3  . baclofen (LIORESAL) 10 MG tablet Take 0.5-1 tablets (5-10 mg total) by mouth 3 (three) times daily as needed for muscle spasms. 30 each 1  . buPROPion (WELLBUTRIN XL) 150 MG 24 hr tablet Take 450 mg by mouth daily.    . calcium-vitamin D (OSCAL WITH D) 500-200 MG-UNIT per tablet Take 1 tablet by mouth 2 (two) times daily.      . cyanocobalamin (,VITAMIN B-12,) 1000 MCG/ML injection Inject 1,000 mcg into the muscle every 30 (thirty) days.    Marland Kitchen LORazepam (ATIVAN) 0.5 MG tablet Take 0.5 mg by mouth See admin instructions. TAKE 1 TABLET (0.5 MG) SCHEDULED IN THE MORNING & 1 TABLET (0.5 MG) IN THE EVENING AS NEEDED FOR ANXIETY.  5  . losartan (COZAAR) 100 MG tablet Take 1 tablet (100 mg total) by mouth daily. 90 tablet 3  . nabumetone (RELAFEN) 750 MG tablet Take 1 tablet (750 mg total) by mouth 2 (two) times daily as needed. 60 tablet 6  . predniSONE (DELTASONE) 10 MG tablet Take as directed for 12 days.  Daily dose 6,6,5,5,4,4,3,3,2,2,1,1. 42 tablet 0  . traMADol (ULTRAM) 50 MG tablet tramadol 50 mg tablet  TK 1 T PO Q 8 H     No facility-administered medications prior to visit.       ROS:  Review of Systems BREAST: No symptoms   OBJECTIVE:   Vitals:  There were no vitals taken for this visit.  Physical Exam  Results: No results  found for this or any previous visit (from the past 24 hour(s)).   Assessment/Plan: No diagnosis found.    No orders of the defined types were placed in this encounter.     No follow-ups on file.  Editha Bridgeforth B. Alucard Fearnow, PA-C 12/11/2018 7:42 PM

## 2018-12-12 ENCOUNTER — Ambulatory Visit: Payer: Medicare Other | Admitting: Obstetrics and Gynecology

## 2018-12-26 ENCOUNTER — Other Ambulatory Visit: Payer: Self-pay

## 2018-12-26 ENCOUNTER — Encounter: Payer: Self-pay | Admitting: Obstetrics and Gynecology

## 2018-12-26 ENCOUNTER — Ambulatory Visit (INDEPENDENT_AMBULATORY_CARE_PROVIDER_SITE_OTHER): Payer: Medicare Other | Admitting: Obstetrics and Gynecology

## 2018-12-26 VITALS — BP 124/84 | Ht 63.0 in | Wt 111.0 lb

## 2018-12-26 DIAGNOSIS — N3000 Acute cystitis without hematuria: Secondary | ICD-10-CM | POA: Diagnosis not present

## 2018-12-26 DIAGNOSIS — I6529 Occlusion and stenosis of unspecified carotid artery: Secondary | ICD-10-CM | POA: Diagnosis not present

## 2018-12-26 LAB — POCT URINALYSIS DIPSTICK
Bilirubin, UA: NEGATIVE
Glucose, UA: NEGATIVE
Ketones, UA: NEGATIVE
Nitrite, UA: NEGATIVE
Protein, UA: NEGATIVE
Spec Grav, UA: 1.01 (ref 1.010–1.025)
pH, UA: 6.5 (ref 5.0–8.0)

## 2018-12-26 MED ORDER — NITROFURANTOIN MONOHYD MACRO 100 MG PO CAPS: 100.0000 mg | ORAL_CAPSULE | Freq: Two times a day (BID) | ORAL | 0 refills | Status: AC

## 2018-12-26 NOTE — Progress Notes (Signed)
[name], MD   Chief Complaint  Patient presents with  . Urinary Tract Infection    started this morning, painful when urinating and back pain, some frequency, no blood in urine    HPI:      [name] is a 73 y.o. G1P1001 who LMP was No LMP recorded. Patient has had a hysterectomy., presents today for UTI sx of dysuria, frequency, mild pelvic discomfort, urinary hesitancy since this morning. Has had LBP for a couple days. No hematuria, vag sx, vag bleeding. Hx of UTIs in past, no recent abx use.    Patient Active Problem List   Diagnosis Date Noted  . Weight loss 12/08/2017  . Urinary hesitancy 10/25/2017  . Tachycardia-bradycardia syndrome ([place]) 07/06/2017  . CAD (coronary artery disease), native coronary artery 07/06/2017  . Acute bilateral thoracic back pain 12/18/2016  . 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 Solitary pulmonary nodule 05/04/2015  . B12 deficiency 05/04/2015  . Orthostasis 03/19/2015  . Dizziness and giddiness 03/19/2015  . Encounter for preventive health examination 03/19/2015  . S/P hysterectomy 06/26/2013  . CKD (chronic kidney disease) stage 3, GFR 30-59 ml/min 06/26/2013  . Hypothyroidism 06/26/2013  . SVT (supraventricular tachycardia) ([place]) 05/29/2013  . Mitral valve prolapse   . HLD (hyperlipidemia)   . Anxiety and depression 09/03/2010  . IBS (irritable bowel syndrome) 09/03/2010  . HYPERCHOLESTEROLEMIA, BORDERLINE 02/06/2008  . Essential hypertension 02/06/2008  . Osteoporosis 02/06/2008  . PERSONAL HISTORY OF MALIGNANT MELANOMA OF SKIN 02/06/2008    Past Surgical History:  Procedure Laterality Date  . ABDOMINAL HYSTERECTOMY  1975  . CATARACT EXTRACTION W/PHACO Right 02/10/2017   Procedure: CATARACT EXTRACTION PHACO AND INTRAOCULAR LENS PLACEMENT (IOC);  Surgeon: Birder Robson, MD;  Location: [facility] ORS;  Service: Ophthalmology;  Laterality: Right;  [place] 00:35.9 AP% 11.5 CDE 4.11 Fluid Pack Lot # [id number]  .  CATARACT EXTRACTION W/PHACO Left 03/09/2017   Procedure: CATARACT EXTRACTION PHACO AND INTRAOCULAR LENS PLACEMENT (IOC);  Surgeon: Birder Robson, MD;  Location: [facility] ORS;  Service: Ophthalmology;  Laterality: Left;  [place] 00:23 AP% 16.3 CDE 3.85 Fluid pack lot # [id number]  . COLONOSCOPY  04/15/2004   2006: Normal  . ENDOMETRIAL ABLATION  1976  . left thumb tendon repair  10/09  . MELANOMA EXCISION  1999   superficial spreading melanoma right post shoulder  . NM MYOCAR PERF WALL MOTION  12/28/2008   No ischemia  . SKIN CANCER EXCISION  03/02/2013   superficial basal cell cancer  . US ECHOCARDIOGRAPHY  10/16/2010   mitral valve leaflets midly thickened,trace MR.  . WRIST SURGERY Left 2011    Family History  Problem Relation Age of Onset  . Emphysema Father        PGF, brother, sister  . COPD Father        PGF, brother, sister  . Heart disease Father   . Rheum arthritis Mother   . Stroke Mother   . Heart disease Mother        Father, brother, sister  . Lung cancer Paternal Grandfather   . Ovarian cancer Paternal Aunt   . Breast cancer Cousin   . Cancer Brother   . Heart disease Brother   . Heart disease Sister     Social History   Socioeconomic History  . Marital status: Married    Spouse name: [name]  . Number of children: 1  . Years of education: Not on file  . Highest education level: Not  on file  Occupational History  . Occupation: Retired  Scientific laboratory technician  . Financial resource strain: Not on file  . Food insecurity    Worry: Not on file    Inability: Not on file  . Transportation needs    Medical: Not on file    Non-medical: Not on file  Tobacco Use  . Smoking status: Former Smoker    Packs/day: 0.50    Years: 20.00    Pack years: 10.00    Types: Cigarettes    Quit date: 02/09/1990    Years since quitting: 28.8  . Smokeless tobacco: Never Used  Substance and Sexual Activity  . Alcohol use: No    Alcohol/week: 0.0 standard drinks  . Drug use: No  .  Sexual activity: Yes  Lifestyle  . Physical activity    Days per week: Not on file    Minutes per session: Not on file  . Stress: Not on file  Relationships  . Social Herbalist on phone: Not on file    Gets together: Not on file    Attends religious service: Not on file    Active member of club or organization: Not on file    Attends meetings of clubs or organizations: Not on file    Relationship status: Not on file  . Intimate partner violence    Fear of current or ex partner: Not on file    Emotionally abused: Not on file    Physically abused: Not on file    Forced sexual activity: Not on file  Other Topics Concern  . Not on file  Social History Narrative   She lives her family, she has one child, retired, no smoke no drink    Outpatient Medications Prior to Visit  Medication Sig Dispense Refill  . buPROPion (WELLBUTRIN XL) 150 MG 24 hr tablet Take 450 mg by mouth daily.    . calcium-vitamin D (OSCAL WITH D) 500-200 MG-UNIT per tablet Take 1 tablet by mouth 2 (two) times daily.      . cyanocobalamin (,VITAMIN B-12,) 1000 MCG/ML injection Inject 1,000 mcg into the muscle every 30 (thirty) days.    Marland Kitchen LORazepam (ATIVAN) 0.5 MG tablet Take 0.5 mg by mouth See admin instructions. TAKE 1 TABLET (0.5 MG) SCHEDULED IN THE MORNING & 1 TABLET (0.5 MG) IN THE EVENING AS NEEDED FOR ANXIETY.  5  . losartan (COZAAR) 100 MG tablet Take 1 tablet (100 mg total) by mouth daily. 90 tablet 3  . amLODipine (NORVASC) 5 MG tablet Take 1 tablet (5 mg total) by mouth daily. 90 tablet 3  . baclofen (LIORESAL) 10 MG tablet Take 0.5-1 tablets (5-10 mg total) by mouth 3 (three) times daily as needed for muscle spasms. 30 each 1  . nabumetone (RELAFEN) 750 MG tablet Take 1 tablet (750 mg total) by mouth 2 (two) times daily as needed. 60 tablet 6  . predniSONE (DELTASONE) 10 MG tablet Take as directed for 12 days.  Daily dose 6,6,5,5,4,4,3,3,2,2,1,1. 42 tablet 0  . traMADol (ULTRAM) 50 MG tablet  tramadol 50 mg tablet  TK 1 T PO Q 8 H     No facility-administered medications prior to visit.       ROS:  Review of Systems  Constitutional: Negative for fever.  Gastrointestinal: Negative for blood in stool, constipation, diarrhea, nausea and vomiting.  Genitourinary: Positive for dysuria and frequency. Negative for dyspareunia, flank pain, hematuria, urgency, vaginal bleeding, vaginal discharge and vaginal pain.  Musculoskeletal:  Negative for back pain.  Skin: Negative for rash.  BREAST: No symptoms   OBJECTIVE:   Vitals:  BP 124/84   Ht 5\' 3"  (1.6 m)   Wt 111 lb (50.3 kg)   BMI 19.66 kg/m   Physical Exam Vitals signs reviewed.  Constitutional:      Appearance: She is well-developed. She is not ill-appearing or toxic-appearing.  Neck:     Musculoskeletal: Normal range of motion.  Pulmonary:     Effort: Pulmonary effort is normal.  Abdominal:     Tenderness: There is no right CVA tenderness or left CVA tenderness.  Musculoskeletal: Normal range of motion.  Neurological:     General: No focal deficit present.     Mental Status: She is alert and oriented to person, place, and time.     Cranial Nerves: No cranial nerve deficit.  Psychiatric:        Behavior: Behavior normal.        Thought Content: Thought content normal.        Judgment: Judgment normal.     Results: Results for orders placed or performed in visit on 12/26/18 (from the past 24 hour(s))  POCT Urinalysis Dipstick     Status: Abnormal   Collection Time: 12/26/18 11:38 AM  Result Value Ref Range   Color, UA yellow    Clarity, UA clear    Glucose, UA Negative Negative   Bilirubin, UA neg    Ketones, UA neg    Spec Grav, UA 1.010 1.010 - 1.025   Blood, UA trace    pH, UA 6.5 5.0 - 8.0   Protein, UA Negative Negative   Urobilinogen, UA     Nitrite, UA neg    Leukocytes, UA Small (1+) (A) Negative   Appearance     Odor       Assessment/Plan: Acute cystitis without hematuria - Plan:  POCT Urinalysis Dipstick, Urine Culture-GYN, nitrofurantoin, macrocrystal-monohydrate, (MACROBID) 100 MG capsule; Pos sx/UA. Rx macrobid. Check C&S. F/u prn.    Meds ordered this encounter  Medications  . nitrofurantoin, macrocrystal-monohydrate, (MACROBID) 100 MG capsule    Sig: Take 1 capsule (100 mg total) by mouth 2 (two) times daily for 5 days.    Dispense:  10 capsule    Refill:  0    Order Specific Question:   Supervising Provider    Answer:   Gae Dry [id number]      Return if symptoms worsen or fail to improve.  Darek Eifler B. Aikam Vinje, PA-C 12/26/2018 11:39 AM

## 2018-12-26 NOTE — Patient Instructions (Signed)
I value your feedback and entrusting us with your care. If you get a [facility] patient survey, I would appreciate you taking the time to let us know about your experience today. Thank you! 

## 2018-12-28 ENCOUNTER — Telehealth: Payer: Self-pay

## 2018-12-28 LAB — URINE CULTURE

## 2018-12-28 NOTE — Telephone Encounter (Signed)
Called pt and she says only sx she is having is frequency. Per [facility], "its' not UTI. Make sure she isn't drinking any caffeine. Can f/u wtih PCP prn sx since not GYN." Pt aware.

## 2018-12-28 NOTE — Progress Notes (Signed)
Pls let pt know C&S neg. Is she still having sx?

## 2018-12-28 NOTE — Telephone Encounter (Signed)
Patient saw [facility] Monday 12/26/2018 & was given an ABX for UTI. It made her sick and today she didn't take it. Inquiring if [facility] wants to send her in something different. [id number]

## 2018-12-28 NOTE — Telephone Encounter (Signed)
Pls let pt know since neg C&S and no longer having sx, nothing else to do. Thx.

## 2019-01-02 ENCOUNTER — Other Ambulatory Visit: Payer: Self-pay

## 2019-01-02 ENCOUNTER — Ambulatory Visit (INDEPENDENT_AMBULATORY_CARE_PROVIDER_SITE_OTHER): Payer: Medicare Other

## 2019-01-02 DIAGNOSIS — E538 Deficiency of other specified B group vitamins: Secondary | ICD-10-CM

## 2019-01-02 MED ORDER — CYANOCOBALAMIN 1000 MCG/ML IJ SOLN
1000.0000 ug | Freq: Once | INTRAMUSCULAR | Status: AC
  Administered 2019-01-02: 10:00:00 1000 ug via INTRAMUSCULAR

## 2019-01-02 NOTE — Progress Notes (Signed)
Patient presented today for B12 injection.  Administered IM in the right deltoid.  Patient tolerated well with no signs of distress.

## 2019-01-10 ENCOUNTER — Other Ambulatory Visit: Payer: Self-pay

## 2019-01-10 ENCOUNTER — Ambulatory Visit (INDEPENDENT_AMBULATORY_CARE_PROVIDER_SITE_OTHER): Payer: Medicare Other | Admitting: Cardiovascular Disease

## 2019-01-10 VITALS — BP 131/77 | HR 89 | Temp 97.0°F | Ht 63.0 in | Wt 112.0 lb

## 2019-01-10 DIAGNOSIS — I471 Supraventricular tachycardia: Secondary | ICD-10-CM

## 2019-01-10 DIAGNOSIS — I6529 Occlusion and stenosis of unspecified carotid artery: Secondary | ICD-10-CM | POA: Diagnosis not present

## 2019-01-10 DIAGNOSIS — I2510 Atherosclerotic heart disease of native coronary artery without angina pectoris: Secondary | ICD-10-CM

## 2019-01-10 DIAGNOSIS — E785 Hyperlipidemia, unspecified: Secondary | ICD-10-CM | POA: Diagnosis not present

## 2019-01-10 DIAGNOSIS — F419 Anxiety disorder, unspecified: Secondary | ICD-10-CM

## 2019-01-10 DIAGNOSIS — I10 Essential (primary) hypertension: Secondary | ICD-10-CM | POA: Diagnosis not present

## 2019-01-10 DIAGNOSIS — F329 Major depressive disorder, single episode, unspecified: Secondary | ICD-10-CM

## 2019-01-10 DIAGNOSIS — F32A Depression, unspecified: Secondary | ICD-10-CM

## 2019-01-10 MED ORDER — LOSARTAN POTASSIUM 100 MG PO TABS: 100.0000 mg | ORAL_TABLET | Freq: Every day | ORAL | 3 refills | Status: DC

## 2019-01-10 MED ORDER — METOPROLOL SUCCINATE ER 25 MG PO TB24: ORAL_TABLET | ORAL | 3 refills | Status: DC

## 2019-01-10 MED ORDER — AMLODIPINE BESYLATE 5 MG PO TABS: 5.0000 mg | ORAL_TABLET | Freq: Every day | ORAL | 3 refills | Status: DC

## 2019-01-10 NOTE — Patient Instructions (Signed)
Medication Instructions:  Start Metoprolol Succinate 25 mg 1/2 tablet daily  If you notice frequent palpitations take 1 whole tablet    *If you need a refill on your cardiac medications before your next appointment, please call your pharmacy*  Lab Work: None ordered   Testing/Procedures: None ordered  Follow-Up: At [facility], you and your health needs are our priority.  As part of our continuing mission to provide you with exceptional heart care, we have created designated Provider Care Teams.  These Care Teams include your primary Cardiologist (physician) and Advanced Practice Providers (APPs -  Physician Assistants and Nurse Practitioners) who all work together to provide you with the care you need, when you need it.  Your next appointment:   6 months      Call in March to schedule June appointment   The format for your next appointment:  Office   Provider:  [facility]

## 2019-01-10 NOTE — Progress Notes (Signed)
Patient ID: Chloe Perkins, female   DOB: 08-18-1945, 73 y.o.   MRN: [id number]    Primary M.D.: Dr. Deborra Medina  HPI: Chloe Perkins is a 73 y.o. female who presents to the office for a 9 month cardiology follow-up evaluation.  Chloe Perkins has a history of SVT and hypertension, for which she has been on diltiazem at 120 mg  and losartan 50 mg daily.  She has  also remote history of thyroid abnormality and in the past  had taken levothyroxine.  She has mild renal insufficiency with stage III renal disease.  Has a history of anxiety/depression for which she takes Wellbutrin XL 300 mg daily.  She also is on calcium with vitamin D in light of osteoporosis.  Recently, she believes that she has been experiencing more episodes of palpitations which are short-lived and at times occur daily.  She denies associated presyncope or syncope.  She denies chest pain.  She recently underwent a panoramix dental x-ray and was told by her dentist that she may have calcification of her carotids.   She has a history of osteoporosis, as well as vitamin B12 deficiency and has previously been found to have stable solitary pulmonary nodule.  She had a follow-up echo Doppler study in November 2016 which showed an ejection fraction at 60-65%.  She had normal diastolic parameters.  There was turbulent flow at the base of her aortic valve in the region of the right coronary cusp which was most likely the cause of her systolic murmur.    I last saw her, she was experiencing  transient palpitations lasting less than 5 seconds, almost on a daily basis.  She has been using caffeine.  She underwent a carotid duplex study in July 2017 which showed heterogeneous plaque bilaterally and 1-39% bilateral ICA stenoses with normal subclavian arteries, and patent vertebral arteries with antegrade flow.  I saw her, I further titrated her Cardizem and suggested that she take 240 mg at bedtime.  She's continued to take losartan 75 mg in the  morning.  Since I  saw her in February 2018, she states that she has continued to experience short-lived episodes of asymptomatic palpitations which may last anywhere from 5-10 seconds, but seemed to occur on a daily basis.  She has been on slow acting diltiazem at 240 mg daily and has continued to take losartan 75 mg daily for hypertension.  She also is on Ativan and Wellbutrin.   She underwent a CT scan by Dr. Deborra Medina.  She was found to have a stable 1 cm nodule in the right lower lobe that was unchanged from previously.  Incidentally, she was also noted to have coronary artery calcification suggestive of CAD.  Once I found out about this, I had contacted her and recommended initiation of Crestor at an initial very low dose of 10 mg.  Since she was having almost daily palpitations when I saw her several weeks ago  I also attempted to add very low-dose metoprolol succinate at 12.5 mg.  Her heart rate had dropped into the upper 40s with this and she stopped taking it; however, toprol did improve her daily palpitations.    When I saw her in October 2018 I had a lengthy discussion with her regarding the fact that she has demonstration of subclinical atherosis in the importance of trying to induce plaque regression.  After much discussion, she agreed to initiate low-dose statin therapy.  She was started on low-dose Crestor.  Apparently,  she self discontinued this since she felt this caused her to be nauseated.  On for brief 14 2018.  Repeat laboratory off treatment showed a total cholesterol 2:15, HDL 87, LDL 114, and triglycerides 71.    When I saw her in March 2019 she had discontinued Crestor because of feeling nauseated which she attributed to Crestor and I initiated Zetia.  She saw [name] on Jul 06, 2017 as an add-on for slow heart rate.  Heart rate on his exam was 48.  He decreased diltiazem back to 120 mg daily   Apparently, Chloe Perkins did not feel well on the 120 of diltiazem and for this  reason went back to take her previous 180 mg dose.  She had noticed her recent pressure elevation and also had noticed a mild headache.  She took the reduced dose ifor 5 days and then went back to 180 mg with feeling improved.     When I saw her on July 29, 2017 for follow-up her blood pressure was elevated on losartan 100 mg daily.  At that time I suggested she discontinue diltiazem and changed her to metoprolol tartrate 25 mg twice a day depending upon her heart rate.  Her ECG during that evaluation showed a bigeminal rhythm with alternating sinus and low atrial beats.  She was tolerating Zetia for hyperlipidemia in light of her documentation of subclinical atherosclerosis.  Apparently, she had called the office and was having issues with elevated blood pressure and slow heart rate.  During my absence, her metoprolol was stopped and she was started on amlodipine 5 mg daily and to use the metoprolol 12.5 mg on an as-needed basis.  She wore a  Monitor for 5 days saw Dr. Caryl Comes for follow-up evaluation.  She is unaware of any recurrent episodes of tachycardia.  She denies presyncope or syncope.  I last saw her in March 2020 at which time she was doing well and denied any recurrent episodes of palpitations.  She was  on amlodipine 5 mg in addition to losartan 100 mg hypertension.  I had started her on Zetia for hyperlipidemia but apparently she was not taking this.. She continued to take bupropion 450 mg for depression.  She had seen Dr. Deborra Medina for annual exam and laboratory from that date was reviewed.  Recent lipid panel in November 2019 was 89 with an HDL of 85.   Since I last saw her, she admits to feeling well from a cardiac standpoint.  He denies chest pain but does admit to some shortness of breath with step walking.  She also has noticed intermittent palpitations  Past Medical History:  Diagnosis Date  . Anxiety and depression   . Arthritis   . Asthmatic bronchitis   . Atypical nevus  01/27/2011   right side abdomen-severe atypia  . Atypical nevus 11/09/2012   moderate atypia-left upper back  . Atypical nevus 11/19/2012   moderate atypia-left forearm  . BCC (basal cell carcinoma) 10/23/2002   left bridge nose-Bcc with sclerosis  . Bronchitis    history of  . Cancer ([place])    MELANOMA  . Depression   . Dysrhythmia   . Heart murmur   . Hemorrhoids   . History of melanoma   . HLD (hyperlipidemia)   . Hypertension   . Melanoma ([place]) 10/18/1997   right post sholder-melanoma level 1  . Mitral valve prolapse   . Osteoporosis   . Personal history of malignant melanoma of skin   .  Superficial basal cell carcinoma (BCC) 01/10/2013   right cheek  . SVT (supraventricular tachycardia) ([place])   . Viral gastroenteritis     Past Surgical History:  Procedure Laterality Date  . ABDOMINAL HYSTERECTOMY  1975  . CATARACT EXTRACTION W/PHACO Right 02/10/2017   Procedure: CATARACT EXTRACTION PHACO AND INTRAOCULAR LENS PLACEMENT (IOC);  Surgeon: Birder Robson, MD;  Location: [facility] ORS;  Service: Ophthalmology;  Laterality: Right;  [place] 00:35.9 AP% 11.5 CDE 4.11 Fluid Pack Lot # [id number]  . CATARACT EXTRACTION W/PHACO Left 03/09/2017   Procedure: CATARACT EXTRACTION PHACO AND INTRAOCULAR LENS PLACEMENT (IOC);  Surgeon: Birder Robson, MD;  Location: [facility] ORS;  Service: Ophthalmology;  Laterality: Left;  [place] 00:23 AP% 16.3 CDE 3.85 Fluid pack lot # [id number]  . COLONOSCOPY  04/15/2004   2006: Normal  . ENDOMETRIAL ABLATION  1976  . left thumb tendon repair  10/09  . MELANOMA EXCISION  1999   superficial spreading melanoma right post shoulder  . NM MYOCAR PERF WALL MOTION  12/28/2008   No ischemia  . SKIN CANCER EXCISION  03/02/2013   superficial basal cell cancer  . US ECHOCARDIOGRAPHY  10/16/2010   mitral valve leaflets midly thickened,trace MR.  . WRIST SURGERY Left 2011    Allergies  Allergen Reactions  . Codeine Nausea And Vomiting  . Macrobid [Nitrofurantoin]      Upsets stomach.  . Adhesive [Tape] Rash    PAPER TAPE ONLY (INTOLERANCE TO LEADS--EKG)  . Alendronate Sodium Other (See Comments)    REACTION: pt states INTOL to Fosamax  . Verapamil Other (See Comments)    REACTION: Intol w/ bradycardia    Current Outpatient Medications  Medication Sig Dispense Refill  . amLODipine (NORVASC) 5 MG tablet Take 1 tablet (5 mg total) by mouth daily. 90 tablet 3  . buPROPion (WELLBUTRIN XL) 150 MG 24 hr tablet Take 450 mg by mouth daily.    . calcium-vitamin D (OSCAL WITH D) 500-200 MG-UNIT per tablet Take 1 tablet by mouth 2 (two) times daily.      . cyanocobalamin (,VITAMIN B-12,) 1000 MCG/ML injection Inject 1,000 mcg into the muscle every 30 (thirty) days.    Marland Kitchen LORazepam (ATIVAN) 0.5 MG tablet Take 0.5 mg by mouth See admin instructions. TAKE 1 TABLET (0.5 MG) SCHEDULED IN THE MORNING & 1 TABLET (0.5 MG) IN THE EVENING AS NEEDED FOR ANXIETY.  5  . losartan (COZAAR) 100 MG tablet Take 1 tablet (100 mg total) by mouth daily. 90 tablet 3  . metoprolol succinate (TOPROL XL) 25 MG 24 hr tablet Take 1/2 tablet  (12.5 )  mg daily 45 tablet 3   No current facility-administered medications for this visit.     Social History   Socioeconomic History  . Marital status: Married    Spouse name: sammy Nehring  . Number of children: 1  . Years of education: Not on file  . Highest education level: Not on file  Occupational History  . Occupation: Retired  Scientific laboratory technician  . Financial resource strain: Not on file  . Food insecurity    Worry: Not on file    Inability: Not on file  . Transportation needs    Medical: Not on file    Non-medical: Not on file  Tobacco Use  . Smoking status: Former Smoker    Packs/day: 0.50    Years: 20.00    Pack years: 10.00    Types: Cigarettes    Quit date: 02/09/1990    Years since quitting: 28.9  .  Smokeless tobacco: Never Used  Substance and Sexual Activity  . Alcohol use: No    Alcohol/week: 0.0 standard drinks  . Drug  use: No  . Sexual activity: Yes  Lifestyle  . Physical activity    Days per week: Not on file    Minutes per session: Not on file  . Stress: Not on file  Relationships  . Social Herbalist on phone: Not on file    Gets together: Not on file    Attends religious service: Not on file    Active member of club or organization: Not on file    Attends meetings of clubs or organizations: Not on file    Relationship status: Not on file  . Intimate partner violence    Fear of current or ex partner: Not on file    Emotionally abused: Not on file    Physically abused: Not on file    Forced sexual activity: Not on file  Other Topics Concern  . Not on file  Social History Narrative   She lives her family, she has one child, retired, no smoke no drink    Family History  Problem Relation Age of Onset  . Emphysema Father        PGF, brother, sister  . COPD Father        PGF, brother, sister  . Heart disease Father   . Rheum arthritis Mother   . Stroke Mother   . Heart disease Mother        Father, brother, sister  . Lung cancer Paternal Grandfather   . Ovarian cancer Paternal Aunt   . Breast cancer Cousin   . Cancer Brother   . Heart disease Brother   . Heart disease Sister     ROS General: Negative; No fevers, chills, or night sweats;  HEENT: Negative; No changes in vision or hearing, sinus congestion, difficulty swallowing Pulmonary: Stable lung nodule Cardiovascular: see HPI GI: Negative; No nausea, vomiting, diarrhea, or abdominal pain GU: Negative; No dysuria, hematuria, or difficulty voiding Musculoskeletal: she complains of chronic back pain.  Hematologic/Oncology: Negative; no easy bruising, bleeding Endocrine: Negative; no heat/cold intolerance; no diabetes Neuro: Negative; no changes in balance, headaches Skin: Negative; No rashes or skin lesions Psychiatric: Positive for anxiety/depression on Wellbutrin Sleep: Negative; No snoring, daytime sleepiness,  hypersomnolence, bruxism, restless legs, hypnogognic hallucinations, no cataplexy Other comprehensive 14 point system review is negative.   PE BP 131/77   Pulse 89   Temp (!) 97 F (36.1 C)   Ht 5' 3"  (1.6 m)   Wt 112 lb (50.8 kg)   PF 98 L/min   BMI 19.84 kg/m    Repeat blood pressure was 132/76  Wt Readings from Last 3 Encounters:  01/10/19 112 lb (50.8 kg)  12/26/18 111 lb (50.3 kg)  10/03/18 110 lb (49.9 kg)   General: Alert, oriented, no distress.  Skin: normal turgor, no rashes, warm and dry HEENT: Normocephalic, atraumatic. Pupils equal round and reactive to light; sclera anicteric; extraocular muscles intact;  Nose without nasal septal hypertrophy Mouth/Parynx benign; Mallinpatti scale 2 Neck: No JVD, no carotid bruits; normal carotid upstroke Lungs: clear to ausculatation and percussion; no wheezing or rales Chest wall: without tenderness to palpitation Heart: PMI not displaced, RRR, s1 s2 normal, 1/6 systolic murmur, no diastolic murmur, no rubs, gallops, thrills, or heaves Abdomen: soft, nontender; no hepatosplenomehaly, BS+; abdominal aorta nontender and not dilated by palpation. Back: no CVA tenderness Pulses 2+ Musculoskeletal: full  range of motion, normal strength, no joint deformities Extremities: no clubbing cyanosis or edema, Homan's sign negative  Neurologic: grossly nonfocal; Cranial nerves grossly wnl Psychologic: Normal mood and affect   ECG (independently read by me): Normal sinus rhythm at 89 bpm.  Low voltage.  Small Q-wave in lead III.  No ectopy.  Normal intervals.  April 11, 2018 ECG (independently read by me): Sinus bradycardia with mild sinus arrhythmia, rate 51.  Low voltage.  Normal intervals.  No ectopy.  August 31, 2017 ECG (independently read by me): Normal sinus rhythm 80 bpm.  No ectopy.  Mild RV conduction delay.  Normal intervals.  July 29, 2017 ECG (independently read by me): Bigeminy rhythm with alternating sinus and low atrial beat  heart rate 60 bpm.  May 05, 2017 ECG (independently read by me): normal sinus rhythm at 62 with a PAC.  Low voltage.  Normal intervals.  October 2018 ECG (independently read by me): Sinus rhythm at 65 bpm, PAC.  PR interval 128 ms, QTc interval 428 ms.  September 2018 ECG (independently read by me): Normal sinus rhythm at 64 bpm, PA-C, mild RV conduction delay.  Nonspecific ST changes.  February 2017 ECG (independently read by me): Sinus bradycardia 53 bpm..  Mild RV conduction delay.  Normal intervals.  November 2017 ECG (independently read by me): Normal sinus rhythm at 61 bpm.  No ectopy.  Normal intervals.  July 2017 ECG (independently read by me): Sinus rhythm at 60 bpm with PAC.  Normal intervals.  November 2016 ECG (independently read by me): Normal sinus rhythm at 66 bpm.  Mild RV conduction delay.  Nondiagnostic small inferior Q waves.  ECG (independently read by me): sinus bradycardia 50 bpm.  Mild RV conduction delay  November 2015 ECG (independently read by me0;  Normal sinus rhythm at 77 bpm.  RV conduction delay.  Nondiagnostic small Q waves.  Nonspecific ST changes.  April 2015 ECG (independently read by me): Sinus bradycardia 54 beats per minute.  Mild RV conduction delay.  No ectopy.  LABS: BMP Latest Ref Rng & Units 12/16/2017 05/05/2017 03/25/2016  Glucose 70 - 99 mg/dL 94 98 95  BUN 6 - 23 mg/dL 22 17 13   Creatinine 0.40 - 1.20 mg/dL 1.01 1.07(H) 1.07(H)  Sodium 135 - 145 mEq/L 140 139 139  Potassium 3.5 - 5.1 mEq/L 4.6 4.4 4.2  Chloride 96 - 112 mEq/L 103 106 106  CO2 19 - 32 mEq/L 29 26 27   Calcium 8.4 - 10.5 mg/dL 10.7(H) 9.2 9.5   Hepatic Function Latest Ref Rng & Units 12/16/2017 05/05/2017 03/25/2016  Total Protein 6.0 - 8.3 g/dL 7.4 7.3 7.2  Albumin 3.5 - 5.2 g/dL 4.6 4.2 4.4  AST 0 - 37 U/L 21 21 23   ALT 0 - 35 U/L 13 13(L) 14  Alk Phosphatase 39 - 117 U/L 60 64 73  Total Bilirubin 0.2 - 1.2 mg/dL 0.5 0.6 0.6  Bilirubin, Direct 0.0 - 0.3 mg/dL - - -    CBC Latest Ref Rng & Units 03/25/2016 12/11/2014 11/28/2014  WBC 3.6 - 11.0 K/uL 3.4(L) 5.0 3.3  Hemoglobin 12.0 - 16.0 g/dL 13.8 14.3 14.3  Hematocrit 35.0 - 47.0 % 40.5 42.5 41  Platelets 150 - 440 K/uL 170 229 202   Lab Results  Component Value Date   MCV 95.1 03/25/2016   MCV 95.5 12/11/2014   MCV 94.7 05/04/2013   Lab Results  Component Value Date   TSH 4.32 12/16/2017  No results found for:  HGBA1C  Lipid Panel     Component Value Date/Time   CHOL 191 12/16/2017 1346   CHOL 203 (H) 05/22/2011 0759   TRIG 83.0 12/16/2017 1346   TRIG 65 05/22/2011 0759   HDL 85.40 12/16/2017 1346   HDL 88 (H) 05/22/2011 0759   CHOLHDL 2 12/16/2017 1346   VLDL 16.6 12/16/2017 1346   VLDL 13 05/22/2011 0759   LDLCALC 89 12/16/2017 1346   LDLCALC 102 (H) 05/22/2011 0759   LDLDIRECT 102.2 04/23/2006 1211     RADIOLOGY: No results found.  IMPRESSION:  1. Essential hypertension   2. Coronary artery calcification seen on CT scan   3. Hyperlipidemia with target LDL less than 70   4. SVT (supraventricular tachycardia) ([place])   5. Anxiety and depression   6. Carotid artery plaque, unspecified laterality     ASSESSMENT AND PLAN: Chloe Perkins is a 73 year-old female who has a history of SVT as well as hypertension. Remotely she had experiencing more frequent palpitations which led to further dose escalation of Cardizem up to 240 mg with improvement and due to blood pressure issues losartan was titrated up to 100 mg.  She had developed bradycardia and an attempt was made to reduce diltiazem but she felt this contributed to her increased blood pressure and did not feel well.  On diltiazem, low-dose metoprolol had been added but due to bradycardia this was discontinued.  Later, diltiazem substituted by amlodipine and blood pressure improved.  Her blood pressure today is stable at 132/76 on amlodipine 5 mg and losartan 100 mg.  With her sensation of frequent palpitations that are short-lived  now that she is no longer on diltiazem I have recommended a trial of metoprolol succinate 12.5 mg.  Resting pulse is presently 89 and the addition of low-dose succinate may be beneficial both in slightly reducing her resting pulse as well as palpitation control.  She continues to be on bupropion 4 to 50 mg for depression.  In the past I had suggested initiation of Zetia but she is not on this.  Mostly she was documented to have mild carotid plaque.  She has not had recent laboratory but this is planned to be done by her primary physician.  When last checked LDL cholesterol was 89 in November 2019.  I will see her in 6 months for follow-up evaluation or sooner if problems arise.   Time spent: 25 minutes  Troy Sine, MD, [facility]  01/12/2019 7:17 PM

## 2019-01-12 ENCOUNTER — Encounter: Payer: Self-pay | Admitting: Cardiovascular Disease

## 2019-01-16 ENCOUNTER — Telehealth: Payer: Self-pay | Admitting: Cardiovascular Disease

## 2019-01-16 NOTE — Telephone Encounter (Signed)
Pt c/o medication issue:  1. Name of Medication:   metoprolol succinate (TOPROL XL) 25 MG 24 hr tablet    2. How are you currently taking this medication (dosage and times per day)? 1/2 tablet every morning  3. Are you having a reaction (difficulty breathing--STAT)? No  4. What is your medication issue? Heart rate has gone in to the 30's, BP has gone up 141/68 hr 41, Patient is c/o fatigue.

## 2019-01-16 NOTE — Telephone Encounter (Signed)
Spoke to pt and she stated that her HR keeps dropping. Asked what a current HR was. She put on SPO2 sensor and the HR dropped from 60s to 50s while talking on the phone. Pt states that she felt a little fatigued. Advised pt to not take Metoprolol today. Educated pt to check HR every morning before taking metoprolol and if HR was less than 60 to not take medication. Pt wants to know if Dr Claiborne Billings wants her to continue to take the medications because she is scared. Advised pt to continue to check HR periodically and if becomes more symptomatic and HR continues to drop to call office back. Advised will route to Dr Claiborne Billings for advise.

## 2019-01-17 NOTE — Telephone Encounter (Signed)
Patient to resume dose of 12.5 of metoprolol succinate.  If resting pulse gets below 50 then need to change to every other day

## 2019-01-17 NOTE — Telephone Encounter (Signed)
Follow up   Pt is calling back, she says she didn't get a call back yesterday and is wondering if she is suppose to continue the medication     Please call

## 2019-01-17 NOTE — Telephone Encounter (Signed)
Per pt has not taken Metoprolol for 2 days and Hr is 80 and B/P was 136/80 Encouraged pt to take Metoprolol and only hold if Hr less than 50 BPM Pt verbalized understanding Awaiting on recommendations from Dr Claiborne Billings .Adonis Housekeeper

## 2019-01-18 NOTE — Telephone Encounter (Signed)
Pt informed of providers result & recommendations. Pt verbalized understanding. All questions answered. No further questions . She will log her HR and when she holds metoprolol. She will call and let us know if this happens often.

## 2019-02-08 ENCOUNTER — Other Ambulatory Visit: Payer: Self-pay

## 2019-02-08 DIAGNOSIS — I491 Atrial premature depolarization: Secondary | ICD-10-CM | POA: Insufficient documentation

## 2019-02-09 ENCOUNTER — Ambulatory Visit (INDEPENDENT_AMBULATORY_CARE_PROVIDER_SITE_OTHER): Payer: Medicare Other | Admitting: Internal Medicine

## 2019-02-09 ENCOUNTER — Encounter: Payer: Self-pay | Admitting: Internal Medicine

## 2019-02-09 VITALS — BP 132/76 | HR 59 | Ht 63.0 in | Wt 110.8 lb

## 2019-02-09 DIAGNOSIS — E039 Hypothyroidism, unspecified: Secondary | ICD-10-CM

## 2019-02-09 DIAGNOSIS — I6529 Occlusion and stenosis of unspecified carotid artery: Secondary | ICD-10-CM

## 2019-02-09 DIAGNOSIS — I491 Atrial premature depolarization: Secondary | ICD-10-CM

## 2019-02-09 DIAGNOSIS — I495 Sick sinus syndrome: Secondary | ICD-10-CM

## 2019-02-09 LAB — CBC
Hematocrit: 40.9 % (ref 34.0–46.6)
Hemoglobin: 13.8 g/dL (ref 11.1–15.9)
MCH: 31.8 pg (ref 26.6–33.0)
MCHC: 33.7 g/dL (ref 31.5–35.7)
MCV: 94 fL (ref 79–97)
Platelets: 175 10*3/uL (ref 150–450)
RBC: 4.34 x10E6/uL (ref 3.77–5.28)
RDW: 11.7 % (ref 11.7–15.4)
WBC: 4.4 10*3/uL (ref 3.4–10.8)

## 2019-02-09 LAB — TSH: TSH: 6.71 u[IU]/mL — ABNORMAL HIGH (ref 0.450–4.500)

## 2019-02-09 NOTE — Patient Instructions (Addendum)
Medication Instructions:  Your physician recommends that you continue on your current medications as directed. Please refer to the Current Medication list given to you today.  *If you need a refill on your cardiac medications before your next appointment, please call your pharmacy*  Lab Work:  TSH and CBC today   If you have labs (blood work) drawn today and your tests are completely normal, you will receive your results only by: Marland Kitchen MyChart Message (if you have MyChart) OR . A paper copy in the mail If you have any lab test that is abnormal or we need to change your treatment, we will call you to review the results.  Testing/Procedures: None ordered.   Follow-Up: At [facility], you and your health needs are our priority.  As part of our continuing mission to provide you with exceptional heart care, we have created designated Provider Care Teams.  These Care Teams include your primary Cardiologist (physician) and Advanced Practice Providers (APPs -  Physician Assistants and Nurse Practitioners) who all work together to provide you with the care you need, when you need it.  Your next appointment:  Follow up with Dr Caryl Comes as needed.

## 2019-02-09 NOTE — Progress Notes (Signed)
Patient Care Team: [name], MD as PCP - General (Internal Medicine) Troy Sine, MD as PCP - Cardiology (Cardiology) Gatha Mayer, MD as Consulting Physician (Gastroenterology) Beverly Gust, MD (Unknown Physician Specialty) For stent is anticoagulated is here can have this lady discussed with him with his caregivers as to whether they think it is possible not and unless he has complete heart block I would call regular was as requested blood pressure so heart intercurrently for anticoagulation with colostomy indicated for the  HPI  Chloe Perkins is a 73 y.o. female seen previously for palpitations, with a remote hx of SVT.  Rx with dilt and possibility of flecainide was offered.  She comes today because of symptomatic bradycardia associated with the resumption of metoprolol undertaken because of symptomatic palpitations.  When in the past she been exposed to metoprolol, significant bradycardia ensued; it has again with heart rates in the thirties and forties.  Spent a long time trying to get a sense of whether the palpitations are problematic or not.  She says that they are mostly not too much of a problem, that the metoprolol is much more of a problem than not taking it.  DATE TEST EF   11/16 Echo   60-65 %           Date Cr K Hgb  11/19 1.01 4.6      Records and Results Reviewed   Past Medical History:  Diagnosis Date  . Anxiety and depression   . Arthritis   . Asthmatic bronchitis   . Atypical nevus 01/27/2011   right side abdomen-severe atypia  . Atypical nevus 11/09/2012   moderate atypia-left upper back  . Atypical nevus 11/19/2012   moderate atypia-left forearm  . BCC (basal cell carcinoma) 10/23/2002   left bridge nose-Bcc with sclerosis  . Bronchitis    history of  . Cancer ([place])    MELANOMA  . Depression   . Dysrhythmia   . Heart murmur   . Hemorrhoids   . History of melanoma   . HLD (hyperlipidemia)   . Hypertension     . Melanoma ([place]) 10/18/1997   right post sholder-melanoma level 1  . Mitral valve prolapse   . Osteoporosis   . Personal history of malignant melanoma of skin   . Superficial basal cell carcinoma (BCC) 01/10/2013   right cheek  . SVT (supraventricular tachycardia) ([place])   . Viral gastroenteritis     Past Surgical History:  Procedure Laterality Date  . ABDOMINAL HYSTERECTOMY  1975  . CATARACT EXTRACTION W/PHACO Right 02/10/2017   Procedure: CATARACT EXTRACTION PHACO AND INTRAOCULAR LENS PLACEMENT (IOC);  Surgeon: Birder Robson, MD;  Location: [facility] ORS;  Service: Ophthalmology;  Laterality: Right;  [place] 00:35.9 AP% 11.5 CDE 4.11 Fluid Pack Lot # [id number]  . CATARACT EXTRACTION W/PHACO Left 03/09/2017   Procedure: CATARACT EXTRACTION PHACO AND INTRAOCULAR LENS PLACEMENT (IOC);  Surgeon: Birder Robson, MD;  Location: [facility] ORS;  Service: Ophthalmology;  Laterality: Left;  [place] 00:23 AP% 16.3 CDE 3.85 Fluid pack lot # [id number]  . COLONOSCOPY  04/15/2004   2006: Normal  . ENDOMETRIAL ABLATION  1976  . left thumb tendon repair  10/09  . MELANOMA EXCISION  1999   superficial spreading melanoma right post shoulder  . NM MYOCAR PERF WALL MOTION  12/28/2008   No ischemia  . SKIN CANCER EXCISION  03/02/2013   superficial basal cell cancer  . US ECHOCARDIOGRAPHY  10/16/2010  mitral valve leaflets midly thickened,trace MR.  . WRIST SURGERY Left 2011    Current Meds  Medication Sig  . amLODipine (NORVASC) 5 MG tablet Take 1 tablet (5 mg total) by mouth daily.  Marland Kitchen buPROPion (WELLBUTRIN XL) 150 MG 24 hr tablet Take 450 mg by mouth daily.  . calcium-vitamin D (OSCAL WITH D) 500-200 MG-UNIT per tablet Take 1 tablet by mouth 2 (two) times daily.    . cyanocobalamin (,VITAMIN B-12,) 1000 MCG/ML injection Inject 1,000 mcg into the muscle every 30 (thirty) days.  Marland Kitchen LORazepam (ATIVAN) 0.5 MG tablet Take 0.5 mg by mouth See admin instructions. TAKE 1 TABLET (0.5 MG) SCHEDULED IN THE MORNING & 1  TABLET (0.5 MG) IN THE EVENING AS NEEDED FOR ANXIETY.  Marland Kitchen losartan (COZAAR) 100 MG tablet Take 1 tablet (100 mg total) by mouth daily.  . metoprolol succinate (TOPROL XL) 25 MG 24 hr tablet Take 1/2 tablet  (12.5 )  mg daily    Allergies  Allergen Reactions  . Codeine Nausea And Vomiting  . Macrobid [Nitrofurantoin]     Upsets stomach.  . Adhesive [Tape] Rash    PAPER TAPE ONLY (INTOLERANCE TO LEADS--EKG)  . Alendronate Sodium Other (See Comments)    REACTION: pt states INTOL to Fosamax  . Verapamil Other (See Comments)    REACTION: Intol w/ bradycardia      Review of Systems negative except from HPI and PMH  Physical Exam BP 132/76   Pulse (!) 59   Ht 5\' 3"  (1.6 m)   Wt 110 lb 12.8 oz (50.3 kg)   SpO2 97%   BMI 19.63 kg/m  Well developed and well nourished in no acute distress HENT normal E scleral and icterus clear Neck Supple JVP flat; carotids brisk and full Clear to ausculation Regular rate and rhythm, no murmurs gallops or rub Soft with active bowel sounds No clubbing cyanosis  Edema Alert and oriented, grossly normal motor and sensory function Skin Warm and Dry  ECG  Personally reviewed  From 01/10/19  Sinus at 89 Intervals 14/08/37 Low voltage limb leads  CrCl cannot be calculated (Patient's most recent lab result is older than the maximum 21 days allowed.).   Assessment and  Plan Sinus bradycardia  PAC  Hypertension  Low voltage limb leads  Hypothyroidism?  Subclinical   The patient has documented PACs and palpitations presumably attributable to that.  In her mind, metoprolol is clearly worse than doing nothing.  I reassured her that her PACs are benign.  We have no documentation of atrial fibrillation, question that she asked because her sisters have it.  She has a history of subclinical hypothyroidism noted in 2018; we will recheck her TSH.  Also asked Dr. Claiborne Billings as to whether a repeat echo was in order given her low voltage, given her age,  myeloma/amyloid is a concern.  We will check her CBC also.  Will defer the possibility of myeloma panel to following her echo  Current medicines are reviewed at length with the patient today .  The patient does not  have concerns regarding medicines.

## 2019-02-10 ENCOUNTER — Other Ambulatory Visit: Payer: Self-pay | Admitting: Internal Medicine

## 2019-02-10 DIAGNOSIS — R946 Abnormal results of thyroid function studies: Secondary | ICD-10-CM

## 2019-02-10 NOTE — Progress Notes (Signed)
Please schedule  patient a telephone or virtual to discuss thyroid labs

## 2019-02-14 ENCOUNTER — Ambulatory Visit (INDEPENDENT_AMBULATORY_CARE_PROVIDER_SITE_OTHER): Payer: Medicare Other | Admitting: Lab

## 2019-02-14 ENCOUNTER — Other Ambulatory Visit: Payer: Self-pay

## 2019-02-14 DIAGNOSIS — E538 Deficiency of other specified B group vitamins: Secondary | ICD-10-CM | POA: Diagnosis not present

## 2019-02-14 MED ORDER — CYANOCOBALAMIN 1000 MCG/ML IJ SOLN
1000.0000 ug | Freq: Once | INTRAMUSCULAR | Status: AC
  Administered 2019-02-14: 10:00:00 1000 ug via INTRAMUSCULAR

## 2019-02-14 NOTE — Progress Notes (Signed)
Reviewed.    Dr Amyiah Gaba 

## 2019-02-14 NOTE — Progress Notes (Signed)
Pt in office today for B-12 injection in L-Deltoid. Pt tolerated well.

## 2019-02-24 ENCOUNTER — Other Ambulatory Visit: Payer: Self-pay

## 2019-02-24 ENCOUNTER — Ambulatory Visit (INDEPENDENT_AMBULATORY_CARE_PROVIDER_SITE_OTHER): Payer: Medicare Other | Admitting: Internal Medicine

## 2019-02-24 ENCOUNTER — Encounter: Payer: Self-pay | Admitting: Internal Medicine

## 2019-02-24 VITALS — Ht 63.0 in | Wt 110.0 lb

## 2019-02-24 DIAGNOSIS — E538 Deficiency of other specified B group vitamins: Secondary | ICD-10-CM | POA: Diagnosis not present

## 2019-02-24 DIAGNOSIS — M810 Age-related osteoporosis without current pathological fracture: Secondary | ICD-10-CM

## 2019-02-24 DIAGNOSIS — N183 Chronic kidney disease, stage 3 unspecified: Secondary | ICD-10-CM

## 2019-02-24 DIAGNOSIS — E782 Mixed hyperlipidemia: Secondary | ICD-10-CM

## 2019-02-24 DIAGNOSIS — I10 Essential (primary) hypertension: Secondary | ICD-10-CM

## 2019-02-24 NOTE — Assessment & Plan Note (Addendum)
Overde for Repeat labs  which have shown no change in Cr  despite use of NSAIDs.  Not used regularly,    Lab Results  Component Value Date   CREATININE 1.01 12/16/2017

## 2019-02-24 NOTE — Assessment & Plan Note (Signed)
Has not had level checked in over a year.   . Lab Results  Component Value Date   VITAMINB12 186 (L) 03/18/2015

## 2019-02-24 NOTE — Assessment & Plan Note (Signed)
With no history of fractures,   Alendronate inolerant.  Prolia advised but deferred by patient. continue calcium supplementation and weight bearing exercise. Evista relatively C/I given CAD 

## 2019-02-24 NOTE — Assessment & Plan Note (Addendum)
Well controlled on current regimen. Renal function is due, no changes today. °

## 2019-02-24 NOTE — Progress Notes (Signed)
Telephone Note  This visit type was conducted due to national recommendations for restrictions regarding the COVID-19 pandemic (e.g. social distancing).  This format is felt to be most appropriate for this patient at this time.  All issues noted in this document were discussed and addressed.  No physical exam was performed (except for noted visual exam findings with Video Visits).   I connected with@ on 02/24/19 at  3:00 PM EST by  telephone and verified that I am speaking with the correct person using two identifiers. Location patient: home Location provider: work or home office Persons participating in the virtual visit: patient, provider  I discussed the limitations, risks, security and privacy concerns of performing an evaluation and management service by telephone and the availability of in person appointments. I also discussed with the patient that there may be a patient responsible charge related to this service. The patient expressed understanding and agreed to proceed.  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  Readings have been for the most part < 140/80 at rest . Patient is following a reduced salt diet most days and is taking medications as prescribed  Was given a trial of 12.5 mg metoprolol,  became very bradycardiac with rate in the 30's,  Felt terrible  Taking losartan and amlodipine  GAD/Depression:  Taking wellbutrin ,  Prn lorazepam.  No new symptoms   Weight stable. Appetite good    ROS: Patient denies headache, fevers, malaise, unintentional weight loss or weight gain, skin rash, eye pain, hair loss,  sinus congestion and sinus pain, sore throat, dysphagia,  hemoptysis , cough, dyspnea, wheezing, chest pain, palpitations, orthopnea, edema, abdominal  pain, nausea, melena, diarrhea, constipation, flank pain, dysuria, hematuria, urinary  Frequency, nocturia, numbness, tingling, seizures,  Focal weakness, Loss of consciousness,  Tremor, insomnia, depression, anxiety, and suicidal ideation.      Past Medical History:  Diagnosis Date  . Anxiety and depression   . Arthritis   . Asthmatic bronchitis   . Atypical nevus 01/27/2011   right side abdomen-severe atypia  . Atypical nevus 11/09/2012   moderate atypia-left upper back  . Atypical nevus 11/19/2012   moderate atypia-left forearm  . BCC (basal cell carcinoma) 10/23/2002   left bridge nose-Bcc with sclerosis  . Bronchitis    history of  . Cancer ([place])    MELANOMA  . Depression   . Dysrhythmia   . Heart murmur   . Hemorrhoids   . History of melanoma   . HLD (hyperlipidemia)   . Hypertension   . Melanoma ([place]) 10/18/1997   right post sholder-melanoma level 1  . Mitral valve prolapse   . Osteoporosis   . Personal history of malignant melanoma of skin   . Superficial basal cell carcinoma (BCC) 01/10/2013   right cheek  . SVT (supraventricular tachycardia) ([place])   . Viral gastroenteritis     Past Surgical History:  Procedure Laterality Date  . ABDOMINAL HYSTERECTOMY  1975  . CATARACT EXTRACTION W/PHACO Right 02/10/2017   Procedure: CATARACT EXTRACTION PHACO AND INTRAOCULAR LENS PLACEMENT (IOC);  Surgeon: Birder Robson, MD;  Location: [facility] ORS;  Service: Ophthalmology;  Laterality: Right;  [place] 00:35.9 AP% 11.5 CDE 4.11 Fluid Pack Lot # [id number]  . CATARACT EXTRACTION W/PHACO Left 03/09/2017   Procedure: CATARACT EXTRACTION PHACO AND INTRAOCULAR LENS  PLACEMENT (IOC);  Surgeon: Birder Robson, MD;  Location: [facility] ORS;  Service: Ophthalmology;  Laterality: Left;  [place] 00:23 AP% 16.3 CDE 3.85 Fluid pack lot # [id number]  . COLONOSCOPY  04/15/2004   2006: Normal  . ENDOMETRIAL ABLATION  1976  . left thumb tendon repair  10/09  . MELANOMA EXCISION  1999   superficial  spreading melanoma right post shoulder  . NM MYOCAR PERF WALL MOTION  12/28/2008   No ischemia  . SKIN CANCER EXCISION  03/02/2013   superficial basal cell cancer  . US ECHOCARDIOGRAPHY  10/16/2010   mitral valve leaflets midly thickened,trace MR.  . WRIST SURGERY Left 2011    Family History  Problem Relation Age of Onset  . Emphysema Father        PGF, brother, sister  . COPD Father        PGF, brother, sister  . Heart disease Father   . Rheum arthritis Mother   . Stroke Mother   . Heart disease Mother        Father, brother, sister  . Lung cancer Paternal Grandfather   . Ovarian cancer Paternal Aunt   . Breast cancer Cousin   . Cancer Brother   . Heart disease Brother   . Heart disease Sister     SOCIAL HX:  reports that she quit smoking about 29 years ago. Her smoking use included cigarettes. She has a 10.00 pack-year smoking history. She has never used smokeless tobacco. She reports that she does not drink alcohol or use drugs.   Current Outpatient Medications:  .  amLODipine (NORVASC) 5 MG tablet, Take 1 tablet (5 mg total) by mouth daily., Disp: 90 tablet, Rfl: 3 .  buPROPion (WELLBUTRIN XL) 150 MG 24 hr tablet, Take 450 mg by mouth daily., Disp: , Rfl:  .  calcium-vitamin D (OSCAL WITH D) 500-200 MG-UNIT per tablet, Take 1 tablet by mouth 2 (two) times daily.  , Disp: , Rfl:  .  cyanocobalamin (,VITAMIN B-12,) 1000 MCG/ML injection, Inject 1,000 mcg into the muscle every 30 (thirty) days., Disp: , Rfl:  .  LORazepam (ATIVAN) 0.5 MG tablet, Take 0.5 mg by mouth See admin instructions. TAKE 1 TABLET (0.5 MG) SCHEDULED IN THE MORNING & 1 TABLET (0.5 MG) IN THE EVENING AS NEEDED FOR ANXIETY., Disp: , Rfl: 5 .  losartan (COZAAR) 100 MG tablet, Take 1 tablet (100 mg total) by mouth daily., Disp: 90 tablet, Rfl: 3 .  metoprolol succinate (TOPROL XL) 25 MG 24 hr tablet, Take 1/2 tablet  (12.5 )  mg daily, Disp: 45 tablet, Rfl: 3  EXAM:   General impression: alert, cooperative  and articulate.  No signs of being in distress  Lungs: speech is fluent sentence length suggests that patient is not short of breath and not punctuated by cough, sneezing or sniffing. Marland Kitchen   Psych: affect normal.  speech is articulate and non pressured .  Denies suicidal thoughts ASSESSMENT AND PLAN:  Discussed the following assessment and plan:  Mixed hyperlipidemia - Plan: Lipid panel  Age-related osteoporosis without current pathological fracture  Stage 3 chronic kidney disease, unspecified whether stage 3a or 3b CKD - Plan: Comprehensive metabolic panel  123456 deficiency - Plan: CBC with Differential/Platelet, Vitamin B12  Essential hypertension  Osteoporosis With no history of fractures,   Alendronate inolerant.  Prolia advised but deferred by patient. continue calcium supplementation and weight bearing exercise. Evista relatively C/I given CAD  CKD (chronic kidney disease) stage 3, GFR 30-59 ml/min  Overde for Repeat labs  which have shown no change in Cr  despite use of NSAIDs.  Not used regularly,    Lab Results  Component Value Date   CREATININE 1.01 12/16/2017      B12 deficiency Has not had level checked in over a year.   . Lab Results  Component Value Date   [id number] (L) 03/18/2015     Essential hypertension Well controlled on current regimen. Renal function is due, no changes today.    I discussed the assessment and treatment plan with the patient. The patient was provided an opportunity to ask questions and all were answered. The patient agreed with the plan and demonstrated an understanding of the instructions.   The patient was advised to call back or seek an in-person evaluation if the symptoms worsen or if the condition fails to improve as anticipated.  I provided  22 minutes of non-face-to-face time during this encounter reviewing patient's current problems and past procedures/imaging studies, providing counseling on the above mentioned problems , and  coordination  of care .  [name], MD

## 2019-03-20 ENCOUNTER — Other Ambulatory Visit: Payer: Self-pay | Admitting: Internal Medicine

## 2019-03-20 DIAGNOSIS — Z1231 Encounter for screening mammogram for malignant neoplasm of breast: Secondary | ICD-10-CM

## 2019-03-21 ENCOUNTER — Ambulatory Visit (INDEPENDENT_AMBULATORY_CARE_PROVIDER_SITE_OTHER): Payer: Medicare Other | Admitting: Lab

## 2019-03-21 ENCOUNTER — Other Ambulatory Visit: Payer: Self-pay

## 2019-03-21 VITALS — Temp 97.0°F

## 2019-03-21 DIAGNOSIS — E538 Deficiency of other specified B group vitamins: Secondary | ICD-10-CM | POA: Diagnosis not present

## 2019-03-21 MED ORDER — CYANOCOBALAMIN 1000 MCG/ML IJ SOLN
1000.0000 ug | Freq: Once | INTRAMUSCULAR | Status: AC
  Administered 2019-03-21: 1000 ug via INTRAMUSCULAR

## 2019-03-21 NOTE — Progress Notes (Addendum)
Pt in office today for Vit B-12 injection in L-Deltoid. Pt tolerated well. Repeat injection in same arm Pt wanted to save arm for 2nd Covid vaccine.  Reviewed.  Dr Nicki Reaper

## 2019-04-18 ENCOUNTER — Telehealth: Payer: Self-pay | Admitting: Internal Medicine

## 2019-04-18 ENCOUNTER — Ambulatory Visit (INDEPENDENT_AMBULATORY_CARE_PROVIDER_SITE_OTHER): Payer: Medicare Other

## 2019-04-18 ENCOUNTER — Other Ambulatory Visit: Payer: Self-pay

## 2019-04-18 DIAGNOSIS — E538 Deficiency of other specified B group vitamins: Secondary | ICD-10-CM

## 2019-04-18 MED ORDER — CYANOCOBALAMIN 1000 MCG/ML IJ SOLN
1000.0000 ug | Freq: Once | INTRAMUSCULAR | Status: AC
  Administered 2019-04-18: 1000 ug via INTRAMUSCULAR

## 2019-04-18 NOTE — Telephone Encounter (Signed)
Left message for patient to call back and schedule Medicare Annual Wellness Visit (AWV) either virtually or audio only.  Last AWV 2.12.18; please schedule at anytime with Denisa O'Brien-Blaney at [facility].

## 2019-04-18 NOTE — Progress Notes (Addendum)
Pt presented today for b12 injection. Left deltoid, IM. Given in same arm due to recently having covid vaccine in right arm. Pt tolerated well.   Reviewed.  Dr Nicki Reaper

## 2019-05-16 ENCOUNTER — Ambulatory Visit
Admission: RE | Admit: 2019-05-16 | Discharge: 2019-05-16 | Disposition: A | Discharge status: Home / Self Care | Payer: Medicare Other | Source: Ambulatory Visit | Attending: Internal Medicine | Admitting: Internal Medicine

## 2019-05-16 DIAGNOSIS — Z1231 Encounter for screening mammogram for malignant neoplasm of breast: Secondary | ICD-10-CM | POA: Insufficient documentation

## 2019-05-23 ENCOUNTER — Ambulatory Visit (INDEPENDENT_AMBULATORY_CARE_PROVIDER_SITE_OTHER): Payer: Medicare Other

## 2019-05-23 ENCOUNTER — Other Ambulatory Visit: Payer: Self-pay

## 2019-05-23 DIAGNOSIS — E538 Deficiency of other specified B group vitamins: Secondary | ICD-10-CM | POA: Diagnosis not present

## 2019-05-23 MED ORDER — CYANOCOBALAMIN 1000 MCG/ML IJ SOLN
1000.0000 ug | Freq: Once | INTRAMUSCULAR | Status: AC
  Administered 2019-05-23: 1000 ug via INTRAMUSCULAR

## 2019-05-23 NOTE — Progress Notes (Signed)
Patient presented for B 12 injection to right deltoid, patient voiced no concerns nor showed any signs of distress during injection. 

## 2019-06-07 ENCOUNTER — Ambulatory Visit (INDEPENDENT_AMBULATORY_CARE_PROVIDER_SITE_OTHER): Payer: Medicare Other | Admitting: Dermatology

## 2019-06-07 DIAGNOSIS — L853 Xerosis cutis: Secondary | ICD-10-CM

## 2019-06-07 DIAGNOSIS — L578 Other skin changes due to chronic exposure to nonionizing radiation: Secondary | ICD-10-CM | POA: Diagnosis not present

## 2019-06-07 DIAGNOSIS — D1800 Hemangioma unspecified site: Secondary | ICD-10-CM | POA: Diagnosis not present

## 2019-06-07 DIAGNOSIS — L820 Inflamed seborrheic keratosis: Secondary | ICD-10-CM | POA: Diagnosis not present

## 2019-06-07 DIAGNOSIS — D229 Melanocytic nevi, unspecified: Secondary | ICD-10-CM

## 2019-06-07 DIAGNOSIS — L814 Other melanin hyperpigmentation: Secondary | ICD-10-CM

## 2019-06-07 DIAGNOSIS — Z85828 Personal history of other malignant neoplasm of skin: Secondary | ICD-10-CM

## 2019-06-07 DIAGNOSIS — Z1283 Encounter for screening for malignant neoplasm of skin: Secondary | ICD-10-CM | POA: Diagnosis not present

## 2019-06-07 DIAGNOSIS — L821 Other seborrheic keratosis: Secondary | ICD-10-CM

## 2019-06-07 DIAGNOSIS — I8390 Asymptomatic varicose veins of unspecified lower extremity: Secondary | ICD-10-CM

## 2019-06-07 DIAGNOSIS — Z85820 Personal history of malignant melanoma of skin: Secondary | ICD-10-CM

## 2019-06-07 NOTE — Progress Notes (Signed)
Follow-Up Visit   Subjective  Chloe Perkins is a 74 y.o. female who presents for the following: Annual Exam (patient has noticed some lesions on her face that she would like checked). Patient presents for total body skin examination today for skin cancer screening and mole check.  The following portions of the chart were reviewed this encounter and updated as appropriate:  Tobacco  Allergies  Meds  Problems  Med Hx  Surg Hx  Fam Hx     Review of Systems:  No other skin or systemic complaints except as noted in HPI or Assessment and Plan.  Objective  Well appearing patient in no apparent distress; mood and affect are within normal limits.  A full examination was performed including scalp, head, eyes, ears, nose, lips, neck, chest, axillae, abdomen, back, buttocks, bilateral upper extremities, bilateral lower extremities, hands, feet, fingers, toes, fingernails, and toenails. All findings within normal limits unless otherwise noted below.  Objective  R inf cheek x 5, L ant thigh x 1 (6): Erythematous keratotic or waxy stuck-on papule or plaque.    Assessment & Plan  Inflamed seborrheic keratosis (6) R inf cheek x 5, L ant thigh x 1  Destruction of lesion - R inf cheek x 5, L ant thigh x 1 Complexity: simple   Destruction method: cryotherapy   Informed consent: discussed and consent obtained   Timeout:  patient name, date of birth, surgical site, and procedure verified Lesion destroyed using liquid nitrogen: Yes   Region frozen until ice ball extended beyond lesion: Yes   Outcome: patient tolerated procedure well with no complications   Post-procedure details: wound care instructions given    Skin cancer screening   Actinic Damage - diffuse scaly erythematous macules with underlying dyspigmentation - Recommend daily broad spectrum sunscreen SPF 30+ to sun-exposed areas, reapply every 2 hours as needed.  - Call for new or changing lesions.  Hemangiomas - Red  papules - Discussed benign nature - Observe - Call for any changes  Lentigines - Scattered tan macules - Discussed due to sun exposure - Benign, observe - Call for any changes  Melanocytic Nevi - Tan-brown and/or pink-flesh-colored symmetric macules and papules - Benign appearing on exam today - Observation - Call clinic for new or changing moles - Recommend daily use of broad spectrum spf 30+ sunscreen to sun-exposed areas.   Seborrheic Keratoses - Stuck-on, waxy, tan-brown papules and plaques  - Discussed benign etiology and prognosis. - Observe - Call for any changes  Varicose Veins and Spider Veins - Dilated blue, purple or red veins at the lower extremities - Reassured - These can be treated by sclerotherapy (a procedure to inject a medicine into the veins to make them disappear) if desired, but the treatment is not covered by insurance  Xerosis - diffuse xerotic patches - recommend gentle, hydrating skin care - gentle skin care handout given  History of Basal Cell Carcinoma of the Skin - No evidence of recurrence today - Recommend regular full body skin exams - Recommend daily broad spectrum sunscreen SPF 30+ to sun-exposed areas, reapply every 2 hours as needed.  - Call if any new or changing lesions are noted between office visits  History of Melanoma - No evidence of recurrence today - No lymphadenopathy - Recommend regular full body skin exams - Recommend daily broad spectrum sunscreen SPF 30+ to sun-exposed areas, reapply every 2 hours as needed.  - Call if any new or changing lesions are noted between office  visits  Skin cancer screening performed today.   Return in about 1 year (around 06/06/2020) for TBSE.  Luther Redo, CMA, am acting as scribe for Sarina Ser, MD .  Documentation: I have reviewed the above documentation for accuracy and completeness, and I agree with the above.  Sarina Ser, MD

## 2019-06-10 ENCOUNTER — Encounter: Payer: Self-pay | Admitting: Dermatology

## 2019-06-22 ENCOUNTER — Ambulatory Visit (INDEPENDENT_AMBULATORY_CARE_PROVIDER_SITE_OTHER): Payer: Medicare Other

## 2019-06-22 ENCOUNTER — Other Ambulatory Visit: Payer: Self-pay

## 2019-06-22 DIAGNOSIS — E538 Deficiency of other specified B group vitamins: Secondary | ICD-10-CM | POA: Diagnosis not present

## 2019-06-22 MED ORDER — CYANOCOBALAMIN 1000 MCG/ML IJ SOLN
1000.0000 ug | Freq: Once | INTRAMUSCULAR | Status: AC
  Administered 2019-06-22: 1000 ug via INTRAMUSCULAR

## 2019-06-22 NOTE — Progress Notes (Signed)
Patient presented for B 12 injection to left deltoid, patient voiced no concerns nor showed any signs of distress during injection. 

## 2019-07-12 ENCOUNTER — Ambulatory Visit (INDEPENDENT_AMBULATORY_CARE_PROVIDER_SITE_OTHER): Payer: Medicare Other | Admitting: Family

## 2019-07-12 ENCOUNTER — Encounter: Payer: Self-pay | Admitting: Family

## 2019-07-12 ENCOUNTER — Other Ambulatory Visit: Payer: Self-pay

## 2019-07-12 VITALS — BP 126/78 | HR 67 | Temp 97.8°F | Ht 63.0 in | Wt 110.8 lb

## 2019-07-12 DIAGNOSIS — N390 Urinary tract infection, site not specified: Secondary | ICD-10-CM | POA: Diagnosis not present

## 2019-07-12 DIAGNOSIS — R300 Dysuria: Secondary | ICD-10-CM

## 2019-07-12 DIAGNOSIS — K521 Toxic gastroenteritis and colitis: Secondary | ICD-10-CM

## 2019-07-12 DIAGNOSIS — T3695XA Adverse effect of unspecified systemic antibiotic, initial encounter: Secondary | ICD-10-CM | POA: Diagnosis not present

## 2019-07-12 LAB — POCT URINALYSIS DIPSTICK
Bilirubin, UA: NEGATIVE
Blood, UA: NEGATIVE
Glucose, UA: NEGATIVE
Ketones, UA: POSITIVE
Nitrite, UA: NEGATIVE
Protein, UA: POSITIVE — AB
Spec Grav, UA: 1.015 (ref 1.010–1.025)
Urobilinogen, UA: 0.2 E.U./dL
pH, UA: 7 (ref 5.0–8.0)

## 2019-07-12 LAB — URINALYSIS, MICROSCOPIC ONLY

## 2019-07-12 MED ORDER — PHENAZOPYRIDINE HCL 100 MG PO TABS: 100.0000 mg | ORAL_TABLET | Freq: Three times a day (TID) | ORAL | 0 refills | Status: DC | PRN

## 2019-07-12 MED ORDER — AMOXICILLIN-POT CLAVULANATE 875-125 MG PO TABS: 1.0000 | ORAL_TABLET | Freq: Two times a day (BID) | ORAL | 0 refills | Status: AC

## 2019-07-12 NOTE — Assessment & Plan Note (Addendum)
UA positive for protein, leukocytes, ketones. Benign exam.  Patient well appearing and non toxic appearance. She preferred to wait on result of urine culture however in case symptoms were to worsen, she understands to start augmentin. Will use pyridium.

## 2019-07-12 NOTE — Patient Instructions (Signed)
Start pyridium for urinary pain   You may start augmentin ( antibiotic) if symptoms worsen, otherwise I will call you with result in 2-3 days of urine culture.   Plenty of water.    Urinary Tract Infection, Adult  A urinary tract infection (UTI) is an infection of any part of the urinary tract. The urinary tract includes the kidneys, ureters, bladder, and urethra. These organs make, store, and get rid of urine in the body. Your health care provider may use other names to describe the infection. An upper UTI affects the ureters and kidneys (pyelonephritis). A lower UTI affects the bladder (cystitis) and urethra (urethritis). What are the causes? Most urinary tract infections are caused by bacteria in your genital area, around the entrance to your urinary tract (urethra). These bacteria grow and cause inflammation of your urinary tract. What increases the risk? You are more likely to develop this condition if:  You have a urinary catheter that stays in place (indwelling).  You are not able to control when you urinate or have a bowel movement (you have incontinence).  You are female and you: ? Use a spermicide or diaphragm for birth control. ? Have low estrogen levels. ? Are pregnant.  You have certain genes that increase your risk (genetics).  You are sexually active.  You take antibiotic medicines.  You have a condition that causes your flow of urine to slow down, such as: ? An enlarged prostate, if you are female. ? Blockage in your urethra (stricture). ? A kidney stone. ? A nerve condition that affects your bladder control (neurogenic bladder). ? Not getting enough to drink, or not urinating often.  You have certain medical conditions, such as: ? Diabetes. ? A weak disease-fighting system (immunesystem). ? Sickle cell disease. ? Gout. ? Spinal cord injury. What are the signs or symptoms? Symptoms of this condition include:  Needing to urinate right away  (urgently).  Frequent urination or passing small amounts of urine frequently.  Pain or burning with urination.  Blood in the urine.  Urine that smells bad or unusual.  Trouble urinating.  Cloudy urine.  Vaginal discharge, if you are female.  Pain in the abdomen or the lower back. You may also have:  Vomiting or a decreased appetite.  Confusion.  Irritability or tiredness.  A fever.  Diarrhea. The first symptom in older adults may be confusion. In some cases, they may not have any symptoms until the infection has worsened. How is this diagnosed? This condition is diagnosed based on your medical history and a physical exam. You may also have other tests, including:  Urine tests.  Blood tests.  Tests for sexually transmitted infections (STIs). If you have had more than one UTI, a cystoscopy or imaging studies may be done to determine the cause of the infections. How is this treated? Treatment for this condition includes:  Antibiotic medicine.  Over-the-counter medicines to treat discomfort.  Drinking enough water to stay hydrated. If you have frequent infections or have other conditions such as a kidney stone, you may need to see a health care provider who specializes in the urinary tract (urologist). In rare cases, urinary tract infections can cause sepsis. Sepsis is a life-threatening condition that occurs when the body responds to an infection. Sepsis is treated in the hospital with IV antibiotics, fluids, and other medicines. Follow these instructions at home:  Medicines  Take over-the-counter and prescription medicines only as told by your health care provider.  If you were prescribed  an antibiotic medicine, take it as told by your health care provider. Do not stop using the antibiotic even if you start to feel better. General instructions  Make sure you: ? Empty your bladder often and completely. Do not hold urine for long periods of time. ? Empty your  bladder after sex. ? Wipe from front to back after a bowel movement if you are female. Use each tissue one time when you wipe.  Drink enough fluid to keep your urine pale yellow.  Keep all follow-up visits as told by your health care provider. This is important. Contact a health care provider if:  Your symptoms do not get better after 1-2 days.  Your symptoms go away and then return. Get help right away if you have:  Severe pain in your back or your lower abdomen.  A fever.  Nausea or vomiting. Summary  A urinary tract infection (UTI) is an infection of any part of the urinary tract, which includes the kidneys, ureters, bladder, and urethra.  Most urinary tract infections are caused by bacteria in your genital area, around the entrance to your urinary tract (urethra).  Treatment for this condition often includes antibiotic medicines.  If you were prescribed an antibiotic medicine, take it as told by your health care provider. Do not stop using the antibiotic even if you start to feel better.  Keep all follow-up visits as told by your health care provider. This is important. This information is not intended to replace advice given to you by your health care provider. Make sure you discuss any questions you have with your health care provider. Document Revised: 01/13/2018 Document Reviewed: 08/05/2017 Elsevier Patient Education  2020 [employer].

## 2019-07-12 NOTE — Progress Notes (Signed)
Subjective:    Patient ID: Chloe Perkins, female    DOB: [birth year], 74 y.o.   MRN: [id number]  CC: Chloe Perkins is a 74 y.o. female who presents today for an acute visit.    HPI: Cc: dysuria x 3 days, unchanged.   Felt 'chills' last night. Otherwise, no fever or chills today.  Dull ache to the right side. Soreness over pelvic area. States no painful.  No n, vomiting, constipation.    No h/o renal stones, diverticulitis,   Last uti couple of years ago.   H/o svt- follows with Dr Claiborne Billings. SVT well controlled. No longer on metoprolol.   HTN- compliant with losartan. no cp.    Urine culture negative 12/2018 HISTORY:  Past Medical History:  Diagnosis Date  . Anxiety and depression   . Arthritis   . Asthmatic bronchitis   . Atypical nevus 01/27/2011   right side abdomen-severe atypia  . Atypical nevus 11/09/2012   moderate atypia-left upper back  . Atypical nevus 11/19/2012   moderate atypia-left forearm  . BCC (basal cell carcinoma) 10/23/2002   left bridge nose-Bcc with sclerosis  . Bronchitis    history of  . Cancer ([place])    MELANOMA  . Depression   . Dysrhythmia   . Heart murmur   . Hemorrhoids   . History of melanoma   . HLD (hyperlipidemia)   . Hypertension   . Melanoma ([place]) 10/18/1997   right post sholder-melanoma level 1  . Mitral valve prolapse   . Osteoporosis   . Personal history of malignant melanoma of skin   . Superficial basal cell carcinoma (BCC) 01/10/2013   right cheek  . SVT (supraventricular tachycardia) ([place])   . Viral gastroenteritis    Past Surgical History:  Procedure Laterality Date  . ABDOMINAL HYSTERECTOMY  1975  . CATARACT EXTRACTION W/PHACO Right 02/10/2017   Procedure: CATARACT EXTRACTION PHACO AND INTRAOCULAR LENS PLACEMENT (IOC);  Surgeon: Birder Robson, MD;  Location: [facility] ORS;  Service: Ophthalmology;  Laterality: Right;  [place] 00:35.9 AP% 11.5 CDE 4.11 Fluid Pack Lot # [id number]  . CATARACT EXTRACTION W/PHACO Left  03/09/2017   Procedure: CATARACT EXTRACTION PHACO AND INTRAOCULAR LENS PLACEMENT (IOC);  Surgeon: Birder Robson, MD;  Location: [facility] ORS;  Service: Ophthalmology;  Laterality: Left;  [place] 00:23 AP% 16.3 CDE 3.85 Fluid pack lot # [id number]  . COLONOSCOPY  04/15/2004   2006: Normal  . ENDOMETRIAL ABLATION  1976  . left thumb tendon repair  10/09  . MELANOMA EXCISION  1999   superficial spreading melanoma right post shoulder  . NM MYOCAR PERF WALL MOTION  12/28/2008   No ischemia  . SKIN CANCER EXCISION  03/02/2013   superficial basal cell cancer  . US ECHOCARDIOGRAPHY  10/16/2010   mitral valve leaflets midly thickened,trace MR.  . WRIST SURGERY Left 2011   Family History  Problem Relation Age of Onset  . Emphysema Father        PGF, brother, sister  . COPD Father        PGF, brother, sister  . Heart disease Father   . Rheum arthritis Mother   . Stroke Mother   . Heart disease Mother        Father, brother, sister  . Lung cancer Paternal Grandfather   . Ovarian cancer Paternal Aunt   . Cancer Brother   . Heart disease Brother   . Heart disease Sister   . Breast cancer Neg Hx     Allergies:  Codeine, Macrobid [nitrofurantoin], Metoprolol, Adhesive [tape], Alendronate sodium, and Verapamil Current Outpatient Medications on File Prior to Visit  Medication Sig Dispense Refill  . buPROPion (WELLBUTRIN XL) 150 MG 24 hr tablet Take 450 mg by mouth daily.    . calcium-vitamin D (OSCAL WITH D) 500-200 MG-UNIT per tablet Take 1 tablet by mouth 2 (two) times daily.      . cyanocobalamin (,VITAMIN B-12,) 1000 MCG/ML injection Inject 1,000 mcg into the muscle every 30 (thirty) days.    Marland Kitchen LORazepam (ATIVAN) 0.5 MG tablet Take 0.5 mg by mouth See admin instructions. TAKE 1 TABLET (0.5 MG) SCHEDULED IN THE MORNING & 1 TABLET (0.5 MG) IN THE EVENING AS NEEDED FOR ANXIETY.  5  . losartan (COZAAR) 100 MG tablet Take 1 tablet (100 mg total) by mouth daily. 90 tablet 3  . amLODipine (NORVASC) 5 MG  tablet Take 1 tablet (5 mg total) by mouth daily. 90 tablet 3   No current facility-administered medications on file prior to visit.    Social History   Tobacco Use  . Smoking status: Former Smoker    Packs/day: 0.50    Years: 20.00    Pack years: 10.00    Types: Cigarettes    Quit date: 02/09/1990    Years since quitting: 29.4  . Smokeless tobacco: Never Used  Substance Use Topics  . Alcohol use: No    Alcohol/week: 0.0 standard drinks  . Drug use: No    Review of Systems  Constitutional: Negative for chills and fever.  Respiratory: Negative for cough.   Cardiovascular: Negative for chest pain and palpitations.  Gastrointestinal: Positive for abdominal pain (soreness ). Negative for nausea and vomiting.  Genitourinary: Positive for dysuria. Negative for difficulty urinating and hematuria.      Objective:    BP 126/78   Pulse 67   Temp 97.8 F (36.6 C) (Temporal)   Ht 5\' 3"  (1.6 m)   Wt 110 lb 12.8 oz (50.3 kg)   SpO2 98%   BMI 19.63 kg/m    Physical Exam Vitals reviewed.  Constitutional:      Appearance: Normal appearance. She is well-developed.  Eyes:     Conjunctiva/sclera: Conjunctivae normal.  Cardiovascular:     Rate and Rhythm: Normal rate and regular rhythm.     Pulses: Normal pulses.     Heart sounds: Normal heart sounds.  Pulmonary:     Effort: Pulmonary effort is normal.     Breath sounds: Normal breath sounds. No wheezing, rhonchi or rales.  Abdominal:     General: Bowel sounds are normal. There is no distension.     Palpations: Abdomen is soft. Abdomen is not rigid. There is no fluid wave or mass.     Tenderness: There is no abdominal tenderness. There is no guarding or rebound.  Skin:    General: Skin is warm and dry.  Neurological:     Mental Status: She is alert.  Psychiatric:        Speech: Speech normal.        Behavior: Behavior normal.        Thought Content: Thought content normal.        Assessment & Plan:   Problem List  Items Addressed This Visit      Other   Dysuria    UA positive for protein, leukocytes, ketones. Benign exam.  Patient well appearing and non toxic appearance. She preferred to wait on result of urine culture however in case symptoms were to  worsen, she understands to start augmentin. Will use pyridium.         Other Visit Diagnoses    Urinary tract infection without hematuria, site unspecified    -  Primary   Relevant Medications   phenazopyridine (PYRIDIUM) 100 MG tablet   amoxicillin-clavulanate (AUGMENTIN) 875-125 MG tablet   Other Relevant Orders   POCT Urinalysis Dipstick (Completed)   Urine Microscopic Only   Urine Culture        I have discontinued Rahcel L. Reznik's metoprolol succinate. I am also having her start on phenazopyridine and amoxicillin-clavulanate. Additionally, I am having her maintain her calcium-vitamin D, LORazepam, buPROPion, cyanocobalamin, amLODipine, and losartan.   Meds ordered this encounter  Medications  . phenazopyridine (PYRIDIUM) 100 MG tablet    Sig: Take 1 tablet (100 mg total) by mouth 3 (three) times daily as needed for pain.    Dispense:  8 tablet    Refill:  0    Order Specific Question:   Supervising Provider    Answer:   Deborra Medina L [[id number]]  . amoxicillin-clavulanate (AUGMENTIN) 875-125 MG tablet    Sig: Take 1 tablet by mouth 2 (two) times daily for 7 days.    Dispense:  14 tablet    Refill:  0    Order Specific Question:   Supervising Provider    Answer:   [name] [[id number]]    Return precautions given.   Risks, benefits, and alternatives of the medications and treatment plan prescribed today were discussed, and patient expressed understanding.   Education regarding symptom management and diagnosis given to patient on AVS.  Continue to follow with [name], MD for routine health maintenance.   Chloe Perkins and I agreed with plan.   Mable Paris, FNP

## 2019-07-14 ENCOUNTER — Telehealth: Payer: Self-pay

## 2019-07-14 LAB — URINE CULTURE
MICRO NUMBER:: 10543771
SPECIMEN QUALITY:: ADEQUATE

## 2019-07-14 NOTE — Telephone Encounter (Signed)
Ot called and stated that the Augmentin she was prescribed for UTI was giving her diarrhea. I told patient this was a common side effect.  I advised to make sure that she took a probiotic or ate some yogurt while taking. She asked if we had culture back & I advised that we did not. She said since we did not have culture & she was told starting antibiotic was optional until culture was back, that she would stop & wait to hear. Pt stated that she would start eating some yogurt as well. Pt will wait to hear from culture.

## 2019-07-17 ENCOUNTER — Other Ambulatory Visit
Admission: RE | Admit: 2019-07-17 | Discharge: 2019-07-17 | Disposition: A | Discharge status: Home / Self Care | Payer: Medicare Other | Source: Ambulatory Visit | Attending: Internal Medicine | Admitting: Internal Medicine

## 2019-07-17 ENCOUNTER — Telehealth: Payer: Self-pay | Admitting: Internal Medicine

## 2019-07-17 DIAGNOSIS — K521 Toxic gastroenteritis and colitis: Secondary | ICD-10-CM | POA: Insufficient documentation

## 2019-07-17 DIAGNOSIS — T3695XA Adverse effect of unspecified systemic antibiotic, initial encounter: Secondary | ICD-10-CM | POA: Diagnosis not present

## 2019-07-17 LAB — C DIFFICILE QUICK SCREEN W PCR REFLEX
C Diff antigen: NEGATIVE
C Diff interpretation: NOT DETECTED
C Diff toxin: NEGATIVE

## 2019-07-17 NOTE — Addendum Note (Signed)
Addended by: Tor Netters I on: 07/17/2019 04:28 PM   Modules accepted: Orders

## 2019-07-17 NOTE — Telephone Encounter (Signed)
Spoke with pt and advised her of the message below. Pt gave a verbal understanding to all directions.

## 2019-07-17 NOTE — Telephone Encounter (Signed)
Pt states that she has had diarrhea about 8 times a day since the start of amoxicillin-clavulanate (AUGMENTIN) 875-125 MG tablet. Pt has no abdominal pain or fever. Please advise

## 2019-07-17 NOTE — Telephone Encounter (Signed)
Should pt come by the office to pick up the stool kit?

## 2019-07-17 NOTE — Telephone Encounter (Signed)
This,  Patient was treated by Joycelyn Schmid with augmentin But  there is no way I can  forward the message to her.  So tell patient to Stop the augmentin and I have ordered the c dificile test.  She should be taking a probiotic and self isolating with regard to use of bathroom if possible until  c dif is ruled out.   Also advise her to Change diet to clear liquids for 24 hours to see if it helps.

## 2019-07-17 NOTE — Addendum Note (Signed)
Addended by: Tor Netters I on: 07/17/2019 04:23 PM   Modules accepted: Orders

## 2019-07-18 ENCOUNTER — Telehealth: Payer: Self-pay | Admitting: Internal Medicine

## 2019-07-18 NOTE — Telephone Encounter (Signed)
Pt called about stool results

## 2019-07-19 NOTE — Telephone Encounter (Signed)
See result note message 

## 2019-07-26 ENCOUNTER — Ambulatory Visit: Payer: Medicare Other

## 2019-07-28 ENCOUNTER — Ambulatory Visit: Payer: Medicare Other | Admitting: Obstetrics and Gynecology

## 2019-07-28 ENCOUNTER — Telehealth: Payer: Self-pay | Admitting: Obstetrics and Gynecology

## 2019-07-28 MED ORDER — SULFAMETHOXAZOLE-TRIMETHOPRIM 800-160 MG PO TABS: 1.0000 | ORAL_TABLET | Freq: Two times a day (BID) | ORAL | 0 refills | Status: AC

## 2019-07-28 NOTE — Progress Notes (Deleted)
[name], MD   No chief complaint on file.   HPI:      [name] is a 74 y.o. G1P1001 whose LMP was No LMP recorded. Patient has had a hysterectomy., presents today for ***    Past Medical History:  Diagnosis Date  . Anxiety and depression   . Arthritis   . Asthmatic bronchitis   . Atypical nevus 01/27/2011   right side abdomen-severe atypia  . Atypical nevus 11/09/2012   moderate atypia-left upper back  . Atypical nevus 11/19/2012   moderate atypia-left forearm  . BCC (basal cell carcinoma) 10/23/2002   left bridge nose-Bcc with sclerosis  . Bronchitis    history of  . Cancer ([place])    MELANOMA  . Depression   . Dysrhythmia   . Heart murmur   . Hemorrhoids   . History of melanoma   . HLD (hyperlipidemia)   . Hypertension   . Melanoma ([place]) 10/18/1997   right post sholder-melanoma level 1  . Mitral valve prolapse   . Osteoporosis   . Personal history of malignant melanoma of skin   . Superficial basal cell carcinoma (BCC) 01/10/2013   right cheek  . SVT (supraventricular tachycardia) ([place])   . Viral gastroenteritis     Past Surgical History:  Procedure Laterality Date  . ABDOMINAL HYSTERECTOMY  1975  . CATARACT EXTRACTION W/PHACO Right 02/10/2017   Procedure: CATARACT EXTRACTION PHACO AND INTRAOCULAR LENS PLACEMENT (IOC);  Surgeon: Birder Robson, MD;  Location: [facility] ORS;  Service: Ophthalmology;  Laterality: Right;  [place] 00:35.9 AP% 11.5 CDE 4.11 Fluid Pack Lot # [id number]  . CATARACT EXTRACTION W/PHACO Left 03/09/2017   Procedure: CATARACT EXTRACTION PHACO AND INTRAOCULAR LENS PLACEMENT (IOC);  Surgeon: Birder Robson, MD;  Location: [facility] ORS;  Service: Ophthalmology;  Laterality: Left;  [place] 00:23 AP% 16.3 CDE 3.85 Fluid pack lot # [id number]  . COLONOSCOPY  04/15/2004   2006: Normal  . ENDOMETRIAL ABLATION  1976  . left thumb tendon repair  10/09  . MELANOMA EXCISION  1999   superficial spreading melanoma right post shoulder  . NM  MYOCAR PERF WALL MOTION  12/28/2008   No ischemia  . SKIN CANCER EXCISION  03/02/2013   superficial basal cell cancer  . US ECHOCARDIOGRAPHY  10/16/2010   mitral valve leaflets midly thickened,trace MR.  . WRIST SURGERY Left 2011    Family History  Problem Relation Age of Onset  . Emphysema Father        PGF, brother, sister  . COPD Father        PGF, brother, sister  . Heart disease Father   . Rheum arthritis Mother   . Stroke Mother   . Heart disease Mother        Father, brother, sister  . Lung cancer Paternal Grandfather   . Ovarian cancer Paternal Aunt   . Cancer Brother   . Heart disease Brother   . Heart disease Sister   . Breast cancer Neg Hx     Social History   Socioeconomic History  . Marital status: Married    Spouse name: sammy Creeden  . Number of children: 1  . Years of education: Not on file  . Highest education level: Not on file  Occupational History  . Occupation: Retired  Tobacco Use  . Smoking status: Former Smoker    Packs/day: 0.50    Years: 20.00    Pack years: 10.00    Types: Cigarettes  Quit date: 02/09/1990    Years since quitting: 29.4  . Smokeless tobacco: Never Used  Vaping Use  . Vaping Use: Never used  Substance and Sexual Activity  . Alcohol use: No    Alcohol/week: 0.0 standard drinks  . Drug use: No  . Sexual activity: Yes  Other Topics Concern  . Not on file  Social History Narrative   She lives her family, she has one child, retired, no smoke no drink   Social Determinants of Radio broadcast assistant Strain:   . Difficulty of Paying Living Expenses:   Food Insecurity:   . Worried About Charity fundraiser in the Last Year:   . Arboriculturist in the Last Year:   Transportation Needs:   . Film/video editor (Medical):   Marland Kitchen Lack of Transportation (Non-Medical):   Physical Activity:   . Days of Exercise per Week:   . Minutes of Exercise per Session:   Stress:   . Feeling of Stress :   Social Connections:     . Frequency of Communication with Friends and Family:   . Frequency of Social Gatherings with Friends and Family:   . Attends Religious Services:   . Active Member of Clubs or Organizations:   . Attends Archivist Meetings:   Marland Kitchen Marital Status:   Intimate Partner Violence:   . Fear of Current or Ex-Partner:   . Emotionally Abused:   Marland Kitchen Physically Abused:   . Sexually Abused:     Outpatient Medications Prior to Visit  Medication Sig Dispense Refill  . amLODipine (NORVASC) 5 MG tablet Take 1 tablet (5 mg total) by mouth daily. 90 tablet 3  . buPROPion (WELLBUTRIN XL) 150 MG 24 hr tablet Take 450 mg by mouth daily.    . calcium-vitamin D (OSCAL WITH D) 500-200 MG-UNIT per tablet Take 1 tablet by mouth 2 (two) times daily.      . cyanocobalamin (,VITAMIN B-12,) 1000 MCG/ML injection Inject 1,000 mcg into the muscle every 30 (thirty) days.    Marland Kitchen LORazepam (ATIVAN) 0.5 MG tablet Take 0.5 mg by mouth See admin instructions. TAKE 1 TABLET (0.5 MG) SCHEDULED IN THE MORNING & 1 TABLET (0.5 MG) IN THE EVENING AS NEEDED FOR ANXIETY.  5  . losartan (COZAAR) 100 MG tablet Take 1 tablet (100 mg total) by mouth daily. 90 tablet 3  . phenazopyridine (PYRIDIUM) 100 MG tablet Take 1 tablet (100 mg total) by mouth 3 (three) times daily as needed for pain. 8 tablet 0   No facility-administered medications prior to visit.      ROS:  Review of Systems BREAST: No symptoms   OBJECTIVE:   Vitals:  There were no vitals taken for this visit.  Physical Exam  Results: No results found for this or any previous visit (from the past 24 hour(s)).   Assessment/Plan: No diagnosis found.    No orders of the defined types were placed in this encounter.     No follow-ups on file.  Iyani Dresner B. Sanita Estrada, PA-C 07/28/2019 10:48 AM

## 2019-07-28 NOTE — Telephone Encounter (Signed)
Pt on my sched today for UTI. Had UTI sx 07/12/19 and saw PCP. Was given augmentin (took for a few days) that caused terrible diarrhea for 5 days. Pt stopped abx and had neg C Diff culture. Urine C&S showed E. Coli. Pt still with UTI sx, never given any other abx. Had stomach upset with macrobid in past. Will try bactrim. Rx eRxd. Pt to RTO with [place] or PCP if UTI sx don't improve.

## 2019-08-02 ENCOUNTER — Telehealth (INDEPENDENT_AMBULATORY_CARE_PROVIDER_SITE_OTHER): Payer: Medicare Other | Admitting: Cardiovascular Disease

## 2019-08-02 ENCOUNTER — Encounter: Payer: Self-pay | Admitting: Cardiovascular Disease

## 2019-08-02 VITALS — BP 117/72 | HR 68 | Ht 63.0 in | Wt 110.0 lb

## 2019-08-02 DIAGNOSIS — I2510 Atherosclerotic heart disease of native coronary artery without angina pectoris: Secondary | ICD-10-CM

## 2019-08-02 DIAGNOSIS — I491 Atrial premature depolarization: Secondary | ICD-10-CM

## 2019-08-02 DIAGNOSIS — I495 Sick sinus syndrome: Secondary | ICD-10-CM

## 2019-08-02 DIAGNOSIS — I10 Essential (primary) hypertension: Secondary | ICD-10-CM | POA: Diagnosis not present

## 2019-08-02 DIAGNOSIS — E039 Hypothyroidism, unspecified: Secondary | ICD-10-CM

## 2019-08-02 DIAGNOSIS — R0602 Shortness of breath: Secondary | ICD-10-CM

## 2019-08-02 NOTE — Patient Instructions (Signed)
Medication Instructions:  CONTINUE WITH CURRENT MEDICATIONS. NO CHANGES.  *If you need a refill on your cardiac medications before your next appointment, please call your pharmacy*   Lab Work: FASTING LABS: CMET CBC TSH LIPIDS If you have labs (blood work) drawn today and your tests are completely normal, you will receive your results only by: Marland Kitchen MyChart Message (if you have MyChart) OR . A paper copy in the mail If you have any lab test that is abnormal or we need to change your treatment, we will call you to review the results.   Testing/Procedures: Your physician has requested that you have an echocardiogram. Echocardiography is a painless test that uses sound waves to create images of your heart. It provides your doctor with information about the size and shape of your heart and how well your heart's chambers and valves are working. This procedure takes approximately one hour. There are no restrictions for this procedure.  [place]   Follow-Up: At [facility], you and your health needs are our priority.  As part of our continuing mission to provide you with exceptional heart care, we have created designated Provider Care Teams.  These Care Teams include your primary Cardiologist (physician) and Advanced Practice Providers (APPs -  Physician Assistants and Nurse Practitioners) who all work together to provide you with the care you need, when you need it.  We recommend signing up for the patient portal called "MyChart".  Sign up information is provided on this After Visit Summary.  MyChart is used to connect with patients for Virtual Visits (Telemedicine).  Patients are able to view lab/test results, encounter notes, upcoming appointments, etc.  Non-urgent messages can be sent to your provider as well.   To learn more about what you can do with MyChart, go to [email or web address].    Your next appointment:   3-4 month(s)  The format for your next appointment:   In  Person  Provider:   Shelva Majestic, MD

## 2019-08-02 NOTE — Progress Notes (Signed)
Virtual Visit via Telephone Note   This visit type was conducted due to national recommendations for restrictions regarding the COVID-19 Pandemic (e.g. social distancing) in an effort to limit this patient's exposure and mitigate transmission in our community.  Due to her co-morbid illnesses, this patient is at least at moderate risk for complications without adequate follow up.  This format is felt to be most appropriate for this patient at this time.  The patient did not have access to video technology/had technical difficulties with video requiring transitioning to audio format only (telephone).  All issues noted in this document were discussed and addressed.  No physical exam could be performed with this format.  Please refer to the patient's chart for her  consent to telehealth for [facility].   The patient was identified using 2 identifiers.  Date:  08/02/2019   ID:  Chloe Perkins, DOB [birth year], MRN [id number]  Patient Location: Home Provider Location: Home  PCP:  [name], MD  Cardiologist:  Shelva Majestic, MD  Electrophysiologist:  None   Evaluation Performed:  Follow-Up Visit  Chief Complaint:  7 month  History of Present Illness:    Chloe Perkins is a 74 y.o. female who has a history of SVT and hypertension.  She has  also remote history of thyroid abnormality and in the past  had taken levothyroxine.  She has mild renal insufficiency with stage III renal disease.  Has a history of anxiety/depression for which she takes Wellbutrin XL 300 mg daily.  She also is on calcium with vitamin D in light of osteoporosis.  Recently, she believes that she has been experiencing more episodes of palpitations which are short-lived and at times occur daily.  She denies associated presyncope or syncope.  She denies chest pain.  She recently underwent a panoramix dental x-ray and was told by her dentist that she may have calcification of her carotids.   She has a history of  osteoporosis, as well as vitamin B12 deficiency and has previously been found to have stable solitary pulmonary nodule.  She had a follow-up echo Doppler study in November 2016 which showed an ejection fraction at 60-65%.  She had normal diastolic parameters.  There was turbulent flow at the base of her aortic valve in the region of the right coronary cusp which was most likely the cause of her systolic murmur.    She was experiencing  transient palpitations lasting less than 5 seconds, almost on a daily basis.  She has been using caffeine.  She underwent a carotid duplex study in July 2017 which showed heterogeneous plaque bilaterally and 1-39% bilateral ICA stenoses with normal subclavian arteries, and patent vertebral arteries with antegrade flow.  I saw her, I further titrated her Cardizem and suggested that she take 240 mg at bedtime.  She's continued to take losartan 75 mg in the morning.  Since I saw her in February 2018, she states that she has continued to experience short-lived episodes of asymptomatic palpitations which may last anywhere from 5-10 seconds, but seemed to occur on a daily basis.  She has been on slow acting diltiazem at 240 mg daily and has continued to take losartan 75 mg daily for hypertension.  She also is on Ativan and Wellbutrin.   She underwent a CT scan by Dr. Deborra Medina.  She was found to have a stable 1 cm nodule in the right lower lobe that was unchanged from previously.  Incidentally, she was also noted  to have coronary artery calcification suggestive of CAD.  Once I found out about this, I had contacted her and recommended initiation of Crestor at an initial very low dose of 10 mg.  Since she was having almost daily palpitations when I saw her and I  attempted to add very low-dose metoprolol succinate at 12.5 mg.  Her heart rate had dropped into the upper 40s with this and she stopped taking it; however, toprol did improve her daily palpitations.    When I saw  her in October 2018 I had a lengthy discussion with her regarding the fact that she has demonstration of subclinical atherosis in the importance of trying to induce plaque regression.  After much discussion, she agreed to initiate low-dose statin therapy.  She was started on low-dose Crestor.  Apparently, she self discontinued this since she felt this caused her to be nauseated.  On for brief 14 2018.  Repeat laboratory off treatment showed a total cholesterol 2:15, HDL 87, LDL 114, and triglycerides 71.    When I saw her in March 2019 she had discontinued Crestor because of feeling nauseated which she attributed to Crestor and I initiated Zetia.  She saw [name] on Jul 06, 2017 as an add-on for slow heart rate.  Heart rate on his exam was 48.  He decreased diltiazem back to 120 mg daily   Apparently, [name] did not feel well on the 120 of diltiazem and for this reason went back to take her previous 180 mg dose.  She had noticed her recent pressure elevation and also had noticed a mild headache.  She took the reduced dose ifor 5 days and then went back to 180 mg with feeling improved.     When I saw her on July 29, 2017 for follow-up her blood pressure was elevated on losartan 100 mg daily.  At that time I suggested she discontinue diltiazem and changed her to metoprolol tartrate 25 mg twice a day depending upon her heart rate.  Her ECG during that evaluation showed a bigeminal rhythm with alternating sinus and low atrial beats.  She was tolerating Zetia for hyperlipidemia in light of her documentation of subclinical atherosclerosis.  Apparently, she had called the office and was having issues with elevated blood pressure and slow heart rate.  During my absence, her metoprolol was stopped and she was started on amlodipine 5 mg daily and to use the metoprolol 12.5 mg on an as-needed basis.  She wore a  Monitor for 5 days saw Dr. Caryl Comes for follow-up evaluation.  She is unaware of any recurrent episodes  of tachycardia.  She denies presyncope or syncope.  I  saw her in March 2020 at which time she was doing well and denied any recurrent episodes of palpitations.  She was  on amlodipine 5 mg in addition to losartan 100 mg hypertension.  I had started her on Zetia for hyperlipidemia but apparently she was not taking this.. She continued to take bupropion 450 mg for depression.  She had seen Dr. Deborra Medina for annual exam and laboratory from that date was reviewed.  Recent lipid panel in November 2019 was 89 with an HDL of 85.   I last saw her in December 2020 at which time she felt that she was stable from a cardiac standpoint.  She denied any chest pain but had noted some shortness of breath with step walking.   Since my evaluation she was seen by Dr. Caryl Comes on February 09, 2019.  Pulse was 59 and blood pressure was stable.  She had documented PACs and he reassured her that her PACs were benign.  There was no documentation of atrial fibrillation.  Over the past 6 months, she does experience some episodes of shortness of breath with activity.  Continue to take Wellbutrin for depression.  She underwent evaluation of thyroid function and TSH was 6.7.  Presently she denies chest pain or shortness of breath.  She had not had recent labs.  She has not had a recent echo since 2016.  The patient does not have symptoms concerning for COVID-19 infection (fever, chills, cough, or new shortness of breath).    Past Medical History:  Diagnosis Date  . Anxiety and depression   . Arthritis   . Asthmatic bronchitis   . Atypical nevus 01/27/2011   right side abdomen-severe atypia  . Atypical nevus 11/09/2012   moderate atypia-left upper back  . Atypical nevus 11/19/2012   moderate atypia-left forearm  . BCC (basal cell carcinoma) 10/23/2002   left bridge nose-Bcc with sclerosis  . Bronchitis    history of  . Cancer ([place])    MELANOMA  . Depression   . Dysrhythmia   . Heart murmur   . Hemorrhoids   .  History of melanoma   . HLD (hyperlipidemia)   . Hypertension   . Melanoma ([place]) 10/18/1997   right post sholder-melanoma level 1  . Mitral valve prolapse   . Osteoporosis   . Personal history of malignant melanoma of skin   . Superficial basal cell carcinoma (BCC) 01/10/2013   right cheek  . SVT (supraventricular tachycardia) ([place])   . Viral gastroenteritis    Past Surgical History:  Procedure Laterality Date  . ABDOMINAL HYSTERECTOMY  1975  . CATARACT EXTRACTION W/PHACO Right 02/10/2017   Procedure: CATARACT EXTRACTION PHACO AND INTRAOCULAR LENS PLACEMENT (IOC);  Surgeon: Birder Robson, MD;  Location: [facility] ORS;  Service: Ophthalmology;  Laterality: Right;  [place] 00:35.9 AP% 11.5 CDE 4.11 Fluid Pack Lot # [id number]  . CATARACT EXTRACTION W/PHACO Left 03/09/2017   Procedure: CATARACT EXTRACTION PHACO AND INTRAOCULAR LENS PLACEMENT (IOC);  Surgeon: Birder Robson, MD;  Location: [facility] ORS;  Service: Ophthalmology;  Laterality: Left;  [place] 00:23 AP% 16.3 CDE 3.85 Fluid pack lot # [id number]  . COLONOSCOPY  04/15/2004   2006: Normal  . ENDOMETRIAL ABLATION  1976  . left thumb tendon repair  10/09  . MELANOMA EXCISION  1999   superficial spreading melanoma right post shoulder  . NM MYOCAR PERF WALL MOTION  12/28/2008   No ischemia  . SKIN CANCER EXCISION  03/02/2013   superficial basal cell cancer  . US ECHOCARDIOGRAPHY  10/16/2010   mitral valve leaflets midly thickened,trace MR.  . WRIST SURGERY Left 2011     Current Meds  Medication Sig  . buPROPion (WELLBUTRIN XL) 150 MG 24 hr tablet Take 450 mg by mouth daily.  . calcium-vitamin D (OSCAL WITH D) 500-200 MG-UNIT per tablet Take 1 tablet by mouth 2 (two) times daily.    . cyanocobalamin (,VITAMIN B-12,) 1000 MCG/ML injection Inject 1,000 mcg into the muscle every 30 (thirty) days.  Marland Kitchen LORazepam (ATIVAN) 0.5 MG tablet Take 0.5 mg by mouth See admin instructions. TAKE 1 TABLET (0.5 MG) SCHEDULED IN THE MORNING & 1 TABLET (0.5 MG) IN  THE EVENING AS NEEDED FOR ANXIETY.  Marland Kitchen losartan (COZAAR) 100 MG tablet Take 1 tablet (100 mg total) by mouth daily.     Allergies:  Codeine, Macrobid [nitrofurantoin], Metoprolol, Adhesive [tape], Alendronate sodium, and Verapamil   Social History   Tobacco Use  . Smoking status: Former Smoker    Packs/day: 0.50    Years: 20.00    Pack years: 10.00    Types: Cigarettes    Quit date: 02/09/1990    Years since quitting: 29.4  . Smokeless tobacco: Never Used  Vaping Use  . Vaping Use: Never used  Substance Use Topics  . Alcohol use: No    Alcohol/week: 0.0 standard drinks  . Drug use: No     Family Hx: The patient's family history includes COPD in her father; Cancer in her brother; Emphysema in her father; Heart disease in her brother, father, mother, and sister; Lung cancer in her paternal grandfather; Ovarian cancer in her paternal aunt; Rheum arthritis in her mother; Stroke in her mother. There is no history of Breast cancer.  ROS:   Please see the history of present illness.    No fevers chills night sweats Palpitations improved but still occur No wheezing Shortness of breath with activity No abdominal discomfort No swelling  positive for depression All other systems reviewed and are negative.   Prior CV studies:   The following studies were reviewed today:  I reviewed the recent evaluation by Dr. Caryl Comes.  Labs/Other Tests and Data Reviewed:    EKG:  An ECG dated 01/10/2020 was personally reviewed today and demonstrated:  NSR 89; low voltage; small Q wave in III, no ectopy   April 11, 2018 ECG (independently read by me): Sinus bradycardia with mild sinus arrhythmia, rate 51.  Low voltage.  Normal intervals.  No ectopy.  August 31, 2017 ECG (independently read by me): Normal sinus rhythm 80 bpm.  No ectopy.  Mild RV conduction delay.  Normal intervals.  July 29, 2017 ECG (independently read by me): Bigeminy rhythm with alternating sinus and low atrial beat heart rate  60 bpm.  Recent Labs: 02/09/2019: Hemoglobin 13.8; Platelets 175; TSH 6.710   Recent Lipid Panel Lab Results  Component Value Date/Time   CHOL 191 12/16/2017 01:46 PM   CHOL 203 (H) 05/22/2011 07:59 AM   TRIG 83.0 12/16/2017 01:46 PM   TRIG 65 05/22/2011 07:59 AM   HDL 85.40 12/16/2017 01:46 PM   HDL 88 (H) 05/22/2011 07:59 AM   CHOLHDL 2 12/16/2017 01:46 PM   LDLCALC 89 12/16/2017 01:46 PM   LDLCALC 102 (H) 05/22/2011 07:59 AM   LDLDIRECT 102.2 04/23/2006 12:11 PM   Lab Results  Component Value Date   TSH 6.710 (H) 02/09/2019    Wt Readings from Last 3 Encounters:  08/02/19 110 lb (49.9 kg)  07/12/19 110 lb 12.8 oz (50.3 kg)  02/24/19 110 lb (49.9 kg)     Objective:    Vital Signs:  BP 117/72   Pulse 68   Ht 5\' 3"  (1.6 m)   Wt 110 lb (49.9 kg)   BMI 19.49 kg/m    This was a telemedicine visit I could not physically examine the patient. Vital signs are reviewed. Resting pulse is in the 60s and blood pressure stable. BMI is very low at 19.49 No audible wheezing Breathing is normal and not labored She was unaware of any irregular heart rhythm presently There was no chest wall soreness There was no abdominal pain He denied swelling    ASSESSMENT & PLAN:    1. Essential hypertension: BP today 117/72 on her regimen consisting of losartan 100 mg. 2. Palpitations: She has been documented  to have occasional PACs.  At her last evaluation I recommended a trial of metoprolol succinate but she felt that this made her heart rate too slow and is no longer taking this. 3. Hyperlipidemia with target LDL less than 70: He has been documented to have carotid artery plaque and coronary artery calcification seen on CT imaging.  She is not had recent lipid panel this will be reassessed. 4. Exertional dyspnea: We will recheck echo Doppler study.  Her last echo evaluation was November 2016 which showed an EF of 60 to 65%. 5. SVT: Currently without recurrence 6. Anxiety/depression:  Currently on bupropion 7. Mild TSH elevation at 6.7.  Not on therapy with only mildly increased and her palpitation history   COVID-19 Education: The signs and symptoms of COVID-19 were discussed with the patient and how to seek care for testing (follow up with PCP or arrange E-visit).  The importance of social distancing was discussed today.  Time:   Today, I have spent 16 minutes with the patient with telehealth technology discussing the above problems.     Medication Adjustments/Labs and Tests Ordered: Current medicines are reviewed at length with the patient today.  Concerns regarding medicines are outlined above.   Tests Ordered: No orders of the defined types were placed in this encounter.   Medication Changes: No orders of the defined types were placed in this encounter.   Follow Up: In office evaluation in 3 months  Signed, Shelva Majestic, MD  08/02/2019 10:00 AM    [place]

## 2019-08-04 ENCOUNTER — Encounter: Payer: Self-pay | Admitting: Cardiovascular Disease

## 2019-08-15 ENCOUNTER — Ambulatory Visit: Payer: Medicare Other

## 2019-08-25 ENCOUNTER — Ambulatory Visit (HOSPITAL_COMMUNITY): Payer: Medicare Other | Attending: Cardiovascular Disease

## 2019-08-25 ENCOUNTER — Other Ambulatory Visit: Payer: Self-pay

## 2019-08-25 DIAGNOSIS — R0602 Shortness of breath: Secondary | ICD-10-CM | POA: Insufficient documentation

## 2019-08-25 DIAGNOSIS — E039 Hypothyroidism, unspecified: Secondary | ICD-10-CM | POA: Insufficient documentation

## 2019-08-25 DIAGNOSIS — I491 Atrial premature depolarization: Secondary | ICD-10-CM | POA: Diagnosis present

## 2019-08-25 DIAGNOSIS — I495 Sick sinus syndrome: Secondary | ICD-10-CM

## 2019-08-25 DIAGNOSIS — I10 Essential (primary) hypertension: Secondary | ICD-10-CM | POA: Diagnosis not present

## 2019-08-25 DIAGNOSIS — I2510 Atherosclerotic heart disease of native coronary artery without angina pectoris: Secondary | ICD-10-CM | POA: Diagnosis present

## 2019-08-25 LAB — ECHOCARDIOGRAM COMPLETE
Area-P 1/2: 2.45 cm2
S' Lateral: 2.2 cm

## 2019-10-04 ENCOUNTER — Ambulatory Visit: Payer: Medicare Other

## 2019-10-04 ENCOUNTER — Ambulatory Visit (INDEPENDENT_AMBULATORY_CARE_PROVIDER_SITE_OTHER): Payer: Medicare Other

## 2019-10-04 ENCOUNTER — Other Ambulatory Visit: Payer: Self-pay

## 2019-10-04 ENCOUNTER — Ambulatory Visit (INDEPENDENT_AMBULATORY_CARE_PROVIDER_SITE_OTHER): Payer: Medicare Other | Admitting: Family Medicine

## 2019-10-04 ENCOUNTER — Encounter: Payer: Self-pay | Admitting: Family Medicine

## 2019-10-04 DIAGNOSIS — M25559 Pain in unspecified hip: Secondary | ICD-10-CM

## 2019-10-04 DIAGNOSIS — M25551 Pain in right hip: Secondary | ICD-10-CM

## 2019-10-04 MED ORDER — HYDROCODONE-ACETAMINOPHEN 5-325 MG PO TABS: 1.0000 | ORAL_TABLET | Freq: Four times a day (QID) | ORAL | 0 refills | Status: DC | PRN

## 2019-10-04 MED ORDER — BACLOFEN 10 MG PO TABS: 5.0000 mg | ORAL_TABLET | Freq: Three times a day (TID) | ORAL | 3 refills | Status: DC | PRN

## 2019-10-04 NOTE — Progress Notes (Signed)
Pain since last Friday Pain is getting worse everyday No known injury Takes ibu and tylenol for pain

## 2019-10-04 NOTE — Progress Notes (Signed)
Office Visit Note   Patient: Chloe Perkins           Date of Birth: [birth year]           MRN: [id number] Visit Date: 10/04/2019 Requested by: [name], MD [address],  [place] [address] PCP: [name], MD  Subjective: Chief Complaint  Patient presents with  . Right Hip - Pain    HPI: She is here with right flank/hip pain.  Symptoms started a few days ago, no injury.  Progressively more severe pain, constant.  She cannot find a comfortable position.  Denies any fevers or chills, no rash, no bowel or bladder dysfunction.  No history of kidney stones.  No recent trauma.              ROS:   All other systems were reviewed and are negative.  Objective: Vital Signs: There were no vitals taken for this visit.  Physical Exam:  General:  Alert and oriented, in no acute distress. Pulm:  Breathing unlabored. Psy:  Normal mood, congruent affect. Skin: No visible rash Right hip: She has a little bit of tenderness in the posterior right hip, flank area.  Some tenderness along the quadratus lumborum but I cannot completely reproduce her pain.  No tenderness over the greater trochanter, no pain with internal hip rotation.  She does have pain with resisted hip flexion.  Imaging: XR HIP UNILAT W OR W/O PELVIS 2-3 VIEWS RIGHT  Result Date: 10/04/2019 X-rays of the right hip reveal old healed left pubic rami fractures.  No right hip DJD.  No sign of stress fracture or neoplasm.  No definite soft tissue calcifications.   Assessment & Plan: 1.  Right hip pain, etiology uncertain.  Cannot rule out nephrolithiasis, early shingles, lumbar disc protrusion. - Urine dipstick. - Meds for pain relief. - Lumbar x-rays and MRI if worsens.     Procedures: No procedures performed  No notes on file     PMFS History: Patient Active Problem List   Diagnosis Date Noted  . PAC (premature atrial contraction) 02/08/2019  . Weight loss 12/08/2017  . Urinary  hesitancy 10/25/2017  . 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 CAD (coronary artery disease), native coronary artery 07/06/2017  . Acute bilateral thoracic back pain 12/18/2016  . 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 Solitary pulmonary nodule 05/04/2015  . B12 deficiency 05/04/2015  . Orthostasis 03/19/2015  . Dizziness and giddiness 03/19/2015  . Encounter for preventive health examination 03/19/2015  . Dysuria 02/07/2015  . S/P hysterectomy 06/26/2013  . CKD (chronic kidney disease) stage 3, GFR 30-59 ml/min 06/26/2013  . Hypothyroidism 06/26/2013  . SVT (supraventricular tachycardia) ([place]) 05/29/2013  . Mitral valve prolapse   . HLD (hyperlipidemia)   . Anxiety and depression 09/03/2010  . IBS (irritable bowel syndrome) 09/03/2010  . HYPERCHOLESTEROLEMIA, BORDERLINE 02/06/2008  . Essential hypertension 02/06/2008  . Osteoporosis 02/06/2008  . PERSONAL HISTORY OF MALIGNANT MELANOMA OF SKIN 02/06/2008   Past Medical History:  Diagnosis Date  . Anxiety and depression   . Arthritis   . Asthmatic bronchitis   . Atypical nevus 01/27/2011   right side abdomen-severe atypia  . Atypical nevus 11/09/2012   moderate atypia-left upper back  . Atypical nevus 11/19/2012   moderate atypia-left forearm  . BCC (basal cell carcinoma) 10/23/2002   left bridge nose-Bcc with sclerosis  . Bronchitis    history of  . Cancer ([place])    MELANOMA  .  Depression   . Dysrhythmia   . Heart murmur   . Hemorrhoids   . History of melanoma   . HLD (hyperlipidemia)   . Hypertension   . Melanoma ([place]) 10/18/1997   right post sholder-melanoma level 1  . Mitral valve prolapse   . Osteoporosis   . Personal history of malignant melanoma of skin   . Superficial basal cell carcinoma (BCC) 01/10/2013   right cheek  . SVT (supraventricular tachycardia) ([place])   . Viral gastroenteritis     Family History  Problem Relation Age of Onset  . Emphysema Father        PGF, brother,  sister  . COPD Father        PGF, brother, sister  . Heart disease Father   . Rheum arthritis Mother   . Stroke Mother   . Heart disease Mother        Father, brother, sister  . Lung cancer Paternal Grandfather   . Ovarian cancer Paternal Aunt   . Cancer Brother   . Heart disease Brother   . Heart disease Sister   . Breast cancer Neg Hx     Past Surgical History:  Procedure Laterality Date  . ABDOMINAL HYSTERECTOMY  1975  . CATARACT EXTRACTION W/PHACO Right 02/10/2017   Procedure: CATARACT EXTRACTION PHACO AND INTRAOCULAR LENS PLACEMENT (IOC);  Surgeon: Birder Robson, MD;  Location: [facility] ORS;  Service: Ophthalmology;  Laterality: Right;  [place] 00:35.9 AP% 11.5 CDE 4.11 Fluid Pack Lot # [id number]  . CATARACT EXTRACTION W/PHACO Left 03/09/2017   Procedure: CATARACT EXTRACTION PHACO AND INTRAOCULAR LENS PLACEMENT (IOC);  Surgeon: Birder Robson, MD;  Location: [facility] ORS;  Service: Ophthalmology;  Laterality: Left;  [place] 00:23 AP% 16.3 CDE 3.85 Fluid pack lot # [id number]  . COLONOSCOPY  04/15/2004   2006: Normal  . ENDOMETRIAL ABLATION  1976  . left thumb tendon repair  10/09  . MELANOMA EXCISION  1999   superficial spreading melanoma right post shoulder  . NM MYOCAR PERF WALL MOTION  12/28/2008   No ischemia  . SKIN CANCER EXCISION  03/02/2013   superficial basal cell cancer  . US ECHOCARDIOGRAPHY  10/16/2010   mitral valve leaflets midly thickened,trace MR.  . WRIST SURGERY Left 2011   Social History   Occupational History  . Occupation: Retired  Tobacco Use  . Smoking status: Former Smoker    Packs/day: 0.50    Years: 20.00    Pack years: 10.00    Types: Cigarettes    Quit date: 02/09/1990    Years since quitting: 29.6  . Smokeless tobacco: Never Used  Vaping Use  . Vaping Use: Never used  Substance and Sexual Activity  . Alcohol use: No    Alcohol/week: 0.0 standard drinks  . Drug use: No  . Sexual activity: Yes

## 2019-10-05 ENCOUNTER — Telehealth: Payer: Self-pay | Admitting: Family Medicine

## 2019-10-05 MED ORDER — PROMETHAZINE HCL 25 MG PO TABS
25.0000 mg | ORAL_TABLET | Freq: Four times a day (QID) | ORAL | 1 refills | Status: DC | PRN
Start: 2019-10-05 — End: 2020-01-16

## 2019-10-05 NOTE — Telephone Encounter (Signed)
Patient's husband Sammy called advised the Oxycodone is making his wife sick. Patient has thrown up several times today. Patient took the last tab this morning and threw up.  Sammy asked if Dr Junius Roads would call her in something for Nausea. The number to contact Sammy is [phone]

## 2019-10-05 NOTE — Telephone Encounter (Signed)
Ok to call in Phenergan tablet, 25 mg, take 1/2-1 PO q 6 hours as needed. #20 tablets with 1 refill.

## 2019-10-05 NOTE — Telephone Encounter (Signed)
Sent to pharmacy, pt aware

## 2019-10-05 NOTE — Telephone Encounter (Signed)
Please advise 

## 2019-10-06 ENCOUNTER — Other Ambulatory Visit: Payer: Self-pay | Admitting: Family Medicine

## 2019-10-06 ENCOUNTER — Telehealth: Payer: Self-pay | Admitting: Family Medicine

## 2019-10-06 DIAGNOSIS — M25559 Pain in unspecified hip: Secondary | ICD-10-CM

## 2019-10-06 MED ORDER — MEPERIDINE HCL 50 MG PO TABS: 50.0000 mg | ORAL_TABLET | Freq: Four times a day (QID) | ORAL | 0 refills | Status: DC | PRN

## 2019-10-06 MED ORDER — ONDANSETRON 4 MG PO TBDP
4.0000 mg | ORAL_TABLET | Freq: Three times a day (TID) | ORAL | 1 refills | Status: DC | PRN
Start: 2019-10-06 — End: 2020-03-20

## 2019-10-06 NOTE — Telephone Encounter (Signed)
I called and spoke with Sammy, the patient's husband: The patient could not keep anything down yesterday. However, today she did eat some breakfast and is drinking fluids - has had no more vomiting. The hip pain is no better. Cannot tolerate the hydrocodone, so has been taking ibuprofen 600 mg - eases the pain some. I spoke with Dr. Junius Roads. He ordered a STAT lumbar xray and MRI Lsp, and sent in demerol and zofran to her pharmacy. If the phenergan is not helping her tolerate the demerol, she can take the zofran instead. If the nausea/vomiting starts again or if the severe hip pain persists, Dr. Junius Roads recommended the patient should go to an urgent care or ER to be evaluated/treated (possible IV fluids). The patient's husband voiced understanding on this.

## 2019-10-06 NOTE — Telephone Encounter (Signed)
Patient's husband called.   Said they were told to call if the patient did not begin to feel any better and she didn't. Requesting a call back to discuss  Call back: [phone]

## 2019-10-06 NOTE — Progress Notes (Signed)
In severe pain, and meds caused vomiting.  Will call in demerol, zofran.  Ordered lumbar x-rays and MRI scan.  Recommended urgent care or ER if vomiting and severe pain persist.

## 2019-10-09 ENCOUNTER — Telehealth: Payer: Self-pay

## 2019-10-09 ENCOUNTER — Other Ambulatory Visit: Payer: Self-pay

## 2019-10-09 ENCOUNTER — Ambulatory Visit
Admission: RE | Admit: 2019-10-09 | Discharge: 2019-10-09 | Disposition: A | Discharge status: Home / Self Care | Payer: Medicare Other | Source: Ambulatory Visit | Attending: Family Medicine | Admitting: Family Medicine

## 2019-10-09 ENCOUNTER — Other Ambulatory Visit: Payer: Self-pay | Admitting: Family Medicine

## 2019-10-09 DIAGNOSIS — M25559 Pain in unspecified hip: Secondary | ICD-10-CM

## 2019-10-09 MED ORDER — TRAMADOL HCL 50 MG PO TABS: 50.0000 mg | ORAL_TABLET | Freq: Four times a day (QID) | ORAL | 0 refills | Status: DC | PRN

## 2019-10-09 NOTE — Telephone Encounter (Signed)
I think Dr. Junius Roads put this in as a STAT MRI, but I am not sure. Can you check on this referral, please?

## 2019-10-09 NOTE — Telephone Encounter (Signed)
Patient husband called in saying they have not been contacted yet for mri, says wife has been in pain

## 2019-10-09 NOTE — Telephone Encounter (Signed)
I called and advised Sammy (husband) he can call [facility] directly to get this set up. Provided the phone number to him.

## 2019-10-09 NOTE — Telephone Encounter (Signed)
No, it was not put in as STAT and on 10/06/2019-1rst attempt/epic order/main # rings no  answer/2nd number vm not set up/BCR, pt can call to schedule the appt since the imaging has tried calling and has been unsuccessfull. [phone]

## 2019-10-10 ENCOUNTER — Other Ambulatory Visit: Payer: Self-pay | Admitting: Family Medicine

## 2019-10-10 ENCOUNTER — Telehealth: Payer: Self-pay | Admitting: Family Medicine

## 2019-10-10 DIAGNOSIS — M25559 Pain in unspecified hip: Secondary | ICD-10-CM

## 2019-10-10 MED ORDER — HYDROCODONE-ACETAMINOPHEN 5-325 MG PO TABS: 1.0000 | ORAL_TABLET | Freq: Four times a day (QID) | ORAL | 0 refills | Status: DC | PRN

## 2019-10-10 NOTE — Telephone Encounter (Signed)
Rx sent 

## 2019-10-10 NOTE — Addendum Note (Signed)
Addended by: Hortencia Pilar on: 10/10/2019 09:36 AM   Modules accepted: Orders

## 2019-10-10 NOTE — Telephone Encounter (Signed)
I called and gave patient the MRI results, she states that she will need to speak with her husband and she will call us back if she decides to proceed with the ESI, and she is requesting a refill on the Hydrocodone.

## 2019-10-10 NOTE — Telephone Encounter (Signed)
MRI shows a right-sided L2-3 disc protrusion contacting the right L3 nerve root.  This could be the source of her pain.  If pain is still severe, we could try to get her in urgently for an ESI.

## 2019-10-12 ENCOUNTER — Encounter: Payer: Self-pay | Admitting: Physical Medicine and Rehabilitation

## 2019-10-12 ENCOUNTER — Ambulatory Visit: Payer: Self-pay

## 2019-10-12 ENCOUNTER — Ambulatory Visit (INDEPENDENT_AMBULATORY_CARE_PROVIDER_SITE_OTHER): Payer: Medicare Other | Admitting: Physical Medicine and Rehabilitation

## 2019-10-12 ENCOUNTER — Other Ambulatory Visit: Payer: Self-pay

## 2019-10-12 VITALS — BP 132/75 | HR 89

## 2019-10-12 DIAGNOSIS — M5416 Radiculopathy, lumbar region: Secondary | ICD-10-CM | POA: Diagnosis not present

## 2019-10-12 MED ORDER — METHYLPREDNISOLONE ACETATE 80 MG/ML IJ SUSP
80.0000 mg | Freq: Once | INTRAMUSCULAR | Status: AC
  Administered 2019-10-12: 80 mg

## 2019-10-12 NOTE — Progress Notes (Signed)
Pt states lower back pain the travels to bot side of her groin area. Pt states rolling over and trying to get out of bed. Pt states sitting makes the pain worse. Pt state pain pill makes the pian feel better.  Numeric Pain Rating Scale and Functional Assessment Average Pain 5   In the last MONTH (on 0-10 scale) has pain interfered with the following?  1. General activity like being  able to carry out your everyday physical activities such as walking, climbing stairs, carrying groceries, or moving a chair?  Rating(5)   +Driver, -BT, -Dye Allergies.

## 2019-10-24 ENCOUNTER — Ambulatory Visit (INDEPENDENT_AMBULATORY_CARE_PROVIDER_SITE_OTHER): Payer: Medicare Other

## 2019-10-24 ENCOUNTER — Other Ambulatory Visit: Payer: Self-pay

## 2019-10-24 DIAGNOSIS — E538 Deficiency of other specified B group vitamins: Secondary | ICD-10-CM

## 2019-10-24 MED ORDER — CYANOCOBALAMIN 1000 MCG/ML IJ SOLN
1000.0000 ug | Freq: Once | INTRAMUSCULAR | Status: AC
Start: 2019-10-24 — End: 2019-10-24
  Administered 2019-10-24: 1000 ug via INTRAMUSCULAR

## 2019-10-24 NOTE — Progress Notes (Signed)
Patient presented for B 12 injection to left deltoid, patient voiced no concerns nor showed any signs of distress during injection. 

## 2019-10-28 NOTE — Progress Notes (Signed)
Chloe Perkins - 74 y.o. female MRN [id number]  Date of birth: 05/31/1945  Office Visit Note: Visit Date: 10/12/2019 PCP: [name], MD Referred by: [name], MD  Subjective: Chief Complaint  Patient presents with   Lower Back - Pain   HPI:  Chloe Perkins is a 74 y.o. female who comes in today at the request of Dr. Eunice Blase for planned Left L3-L4 Lumbar epidural steroid injection with fluoroscopic guidance.  The patient has failed conservative care including home exercise, medications, time and activity modification.  This injection will be diagnostic and hopefully therapeutic.  Please see requesting physician notes for further details and justification. She states lower back pain the travels to both side of her groin area. She states worsening rolling over and trying to get out of bed.  MRI reviewed with images and spine model.  MRI reviewed in the note below.   ROS Otherwise per HPI.  Assessment & Plan: Visit Diagnoses:  1. Lumbar radiculopathy     Plan: No additional findings.   Meds & Orders:  Meds ordered this encounter  Medications   methylPREDNISolone acetate (DEPO-MEDROL) injection 80 mg    Orders Placed This Encounter  Procedures   XR C-ARM NO REPORT   Epidural Steroid injection    Follow-up: Return for visit to requesting physician as needed.   Procedures: No procedures performed  Lumbar Epidural Steroid Injection - Interlaminar Approach with Fluoroscopic Guidance  Patient: Chloe Perkins      Date of Birth: Aug 25, 1945 MRN: [id number] PCP: [name], MD      Visit Date: 10/12/2019   Universal Protocol:     Consent Given By: the patient  Position: PRONE  Additional Comments: Vital signs were monitored before and after the procedure. Patient was prepped and draped in the usual sterile fashion. The correct patient, procedure, and site was verified.   Injection Procedure Details:  Procedure Site One Meds  Administered:  Meds ordered this encounter  Medications   methylPREDNISolone acetate (DEPO-MEDROL) injection 80 mg     Laterality: Left  Location/Site:  L3-L4  Needle size: 20 G  Needle type: Tuohy  Needle Placement: Paramedian epidural  Findings:   -Comments: Excellent flow of contrast into the epidural space.  Procedure Details: Using a paramedian approach from the side mentioned above, the region overlying the inferior lamina was localized under fluoroscopic visualization and the soft tissues overlying this structure were infiltrated with 4 ml. of 1% Lidocaine without Epinephrine. The Tuohy needle was inserted into the epidural space using a paramedian approach.   The epidural space was localized using loss of resistance along with lateral and bi-planar fluoroscopic views.  After negative aspirate for air, blood, and CSF, a 2 ml. volume of Isovue-250 was injected into the epidural space and the flow of contrast was observed. Radiographs were obtained for documentation purposes.    The injectate was administered into the level noted above.   Additional Comments:  The patient tolerated the procedure well Dressing: 2 x 2 sterile gauze and Band-Aid    Post-procedure details: Patient was observed during the procedure. Post-procedure instructions were reviewed.  Patient left the clinic in stable condition.     Clinical History: MRI LUMBAR SPINE WITHOUT CONTRAST  COMPARISON:  Lumbar spine radiographs 10/09/2019, lumbar spine MRI 11/04/2014  FINDINGS: Segmentation: For the purposes of this dictation, five lumbar vertebrae are assumed and the caudal most well-formed intervertebral disc is designated L5-S1.  Alignment:  Mild grade 1  retrolisthesis at L1-L2, L2-L3 and L3-L4.  Vertebrae: There is irregularity of the L2 superior endplate anteriorly with moderate surrounding edema. This has an appearance most suggestive of an acute Schmorl node. Trace degenerative  edema is also present anteriorly along the L5 superior endplate. There is nonspecific background diffuse marrow heterogeneity. Additional levels of degenerative endplate irregularity.  Conus medullaris and cauda equina: Conus extends to the L1-L2 level. There is mild prominence of the central canal within the visualized distal spinal cord measuring less than 2 mm in width.   Disc levels:  Unless otherwise stated, the level by level findings below have not significantly changed since prior MRI 11/04/2014.  Progressive multilevel disc degeneration. Most notably, there is now mild/moderate disc degeneration at L1-L2. No more than mild disc degeneration at the remaining levels.  T12-L1: No significant disc herniation or stenosis.  L1-L2: Minimal disc bulge. New superimposed shallow right subarticular/foraminal disc protrusion. Minimal relative right subarticular narrowing without nerve root impingement. Central canal patent. No significant foraminal stenosis.  L2-L3: Trace retrolisthesis. Progressive disc bulge. New from the prior examination, there is a superimposed right subarticular/foraminal disc protrusion. The disc protrusion contributes to mild right subarticular narrowing, contacting the descending right L3 nerve root (series 13, image 17). Central canal patent. No significant foraminal stenosis.  L3-L4: Trace retrolisthesis. Progressive disc bulge. New from the prior examination, there is a shallow right center/subarticular disc protrusion. This contributes to mild relative right subarticular narrowing without frank nerve root impingement. Central canal patent. No significant foraminal stenosis.  L4-L5: Disc bulge. New superimposed shallow broad-based central disc protrusion. Mild facet arthrosis/ligamentum flavum hypertrophy. Small right-sided posteriorly projecting synovial facet cyst. Minimal bilateral subarticular narrowing is new from the  prior examination. No frank nerve root impingement. The central canal is patent. Progressive mild bilateral inferior neural foraminal narrowing.  L5-S1: Small disc bulge. Mild facet arthrosis/ligamentum flavum hypertrophy. Minimal right subarticular narrowing without frank nerve root impingement, progressed. Central canal patent. No significant foraminal stenosis.  IMPRESSION: Lumbar spondylosis as outlined and having progressed at several levels since the prior MRI of 11/04/2014. Findings are most notably as follows.  There is irregularity of the L2 superior endplate anteriorly with moderate surrounding edema. These findings have an appearance most suggestive of an acute Schmorl node.  At L2-L3, a new right subarticular/foraminal disc protrusion contributes to mild right subarticular narrowing with contact upon the descending right L3 nerve root.  At L4-L5, there is progressive multifactorial mild bilateral subarticular narrowing without frank nerve root impingement. Progressive mild bilateral inferior neural foraminal narrowing.  No more than mild spinal canal narrowing, and no significant neural foraminal narrowing, at the remaining levels.  Multiple subcentimeter T2 hypointense foci within the right kidney may reflect small hemorrhagic renal cysts versus renal calculi.   Electronically Signed   By: Kellie Simmering DO   On: 10/10/2019 08:54     Objective:  VS:  HT:     WT:    BMI:      BP:132/75   HR:89bpm   TEMP: ( )   RESP:  Physical Exam Constitutional:      General: She is not in acute distress.    Appearance: Normal appearance. She is not ill-appearing.  HENT:     Head: Normocephalic and atraumatic.     Right Ear: External ear normal.     Left Ear: External ear normal.  Eyes:     Extraocular Movements: Extraocular movements intact.  Cardiovascular:     Rate and Rhythm: Normal rate.  Pulses: Normal pulses.  Musculoskeletal:     Right lower leg:  No edema.     Left lower leg: No edema.     Comments: Patient has good distal strength with no pain over the greater trochanters.  No clonus or focal weakness.  Skin:    Findings: No erythema, lesion or rash.  Neurological:     General: No focal deficit present.     Mental Status: She is alert and oriented to person, place, and time.     Sensory: No sensory deficit.     Motor: No weakness or abnormal muscle tone.     Coordination: Coordination normal.  Psychiatric:        Mood and Affect: Mood normal.        Behavior: Behavior normal.      Imaging: No results found.

## 2019-10-28 NOTE — Procedures (Signed)
Lumbar Epidural Steroid Injection - Interlaminar Approach with Fluoroscopic Guidance  Patient: Chloe Perkins      Date of Birth: [birth year] MRN: [id number] PCP: [name], MD      Visit Date: 10/12/2019   Universal Protocol:     Consent Given By: the patient  Position: PRONE  Additional Comments: Vital signs were monitored before and after the procedure. Patient was prepped and draped in the usual sterile fashion. The correct patient, procedure, and site was verified.   Injection Procedure Details:  Procedure Site One Meds Administered:  Meds ordered this encounter  Medications  . methylPREDNISolone acetate (DEPO-MEDROL) injection 80 mg     Laterality: Left  Location/Site:  L3-L4  Needle size: 20 G  Needle type: Tuohy  Needle Placement: Paramedian epidural  Findings:   -Comments: Excellent flow of contrast into the epidural space.  Procedure Details: Using a paramedian approach from the side mentioned above, the region overlying the inferior lamina was localized under fluoroscopic visualization and the soft tissues overlying this structure were infiltrated with 4 ml. of 1% Lidocaine without Epinephrine. The Tuohy needle was inserted into the epidural space using a paramedian approach.   The epidural space was localized using loss of resistance along with lateral and bi-planar fluoroscopic views.  After negative aspirate for air, blood, and CSF, a 2 ml. volume of Isovue-250 was injected into the epidural space and the flow of contrast was observed. Radiographs were obtained for documentation purposes.    The injectate was administered into the level noted above.   Additional Comments:  The patient tolerated the procedure well Dressing: 2 x 2 sterile gauze and Band-Aid    Post-procedure details: Patient was observed during the procedure. Post-procedure instructions were reviewed.  Patient left the clinic in stable condition.

## 2019-11-07 ENCOUNTER — Other Ambulatory Visit: Payer: Self-pay

## 2019-11-07 ENCOUNTER — Ambulatory Visit (INDEPENDENT_AMBULATORY_CARE_PROVIDER_SITE_OTHER): Payer: Medicare Other | Admitting: Obstetrics & Gynecology

## 2019-11-07 ENCOUNTER — Encounter: Payer: Self-pay | Admitting: Obstetrics & Gynecology

## 2019-11-07 VITALS — BP 150/90 | Ht 64.0 in | Wt 105.0 lb

## 2019-11-07 DIAGNOSIS — N3000 Acute cystitis without hematuria: Secondary | ICD-10-CM

## 2019-11-07 DIAGNOSIS — R300 Dysuria: Secondary | ICD-10-CM

## 2019-11-07 DIAGNOSIS — I2510 Atherosclerotic heart disease of native coronary artery without angina pectoris: Secondary | ICD-10-CM

## 2019-11-07 LAB — POCT URINALYSIS DIPSTICK
Bilirubin, UA: NEGATIVE
Blood, UA: NEGATIVE
Glucose, UA: NEGATIVE
Ketones, UA: NEGATIVE
Nitrite, UA: NEGATIVE
Protein, UA: NEGATIVE
Spec Grav, UA: 1.01 (ref 1.010–1.025)
Urobilinogen, UA: 0.2 E.U./dL
pH, UA: 5 (ref 5.0–8.0)

## 2019-11-07 MED ORDER — SULFAMETHOXAZOLE-TRIMETHOPRIM 800-160 MG PO TABS: 1.0000 | ORAL_TABLET | Freq: Two times a day (BID) | ORAL | 0 refills | Status: AC

## 2019-11-07 NOTE — Patient Instructions (Signed)
Sulfamethoxazole; Trimethoprim, SMX-TMP tablets for UTI  What is this medicine? SULFAMETHOXAZOLE; TRIMETHOPRIM or SMX-TMP (suhl fuh meth OK suh zohl; trye METH oh prim) is a combination of a sulfonamide antibiotic and a second antibiotic, trimethoprim. It is used to treat or prevent certain kinds of bacterial infections. It will not work for colds, flu, or other viral infections. This medicine may be used for other purposes; ask your health care provider or pharmacist if you have questions. COMMON BRAND NAME(S): Bacter-Aid DS, Bactrim, Bactrim DS, Septra, Septra DS What should I tell my health care provider before I take this medicine? They need to know if you have any of these conditions:  G6PD deficiency  HIV or AIDS  kidney disease  liver disease  low platelets  low red blood cell counts  poor nutrition  stomach or intestine problems like colitis  thyroid disease  an unusual or allergic reaction to sulfamethoxazole, trimethoprim, sulfa drugs, other drugs, foods, dyes, or preservatives  pregnant or trying to get pregnant  breast-feeding How should I use this medicine? Take this medicine by mouth with a glass of water. Follow the directions on the prescription label. Take your medicine at regular intervals. Do not take it more often than directed. Take all of your medicine as directed even if you think you are better. Do not skip doses or stop your medicine early. Talk to your pediatrician regarding the use of this medicine in children. While this drug may be prescribed for children as young as 2 months for selected conditions, precautions do apply. Overdosage: If you think you have taken too much of this medicine contact a poison control center or emergency room at once. NOTE: This medicine is only for you. Do not share this medicine with others. What if I miss a dose? If you miss a dose, take it as soon as you can. If it is almost time for your next dose, take only that dose.  Do not take double or extra doses. What may interact with this medicine? Do not take this medicine with any of the following medications:  dofetilide This medicine may also interact with the following medications:  amantadine  birth control pills  certain medicines for blood pressure, heart disease  certain medicines for depression, like amitriptyline  certain medicines for diabetes, like glipizide or glyburide  certain medicines that treat or prevent blood clots like warfarin  cyclosporine  digoxin  diuretics  indomethacin  methotrexate  phenytoin  procainamide  pyrimethamine  zidovudine This list may not describe all possible interactions. Give your health care provider a list of all the medicines, herbs, non-prescription drugs, or dietary supplements you use. Also tell them if you smoke, drink alcohol, or use illegal drugs. Some items may interact with your medicine. What should I watch for while using this medicine? Tell your health care provider if your symptoms do not start to get better or if they get worse. Do not treat diarrhea with over the counter products. Contact your health care provider if you have diarrhea that lasts more than 2 days or if it is severe and watery. This drug may cause serious skin reactions. They can happen weeks to months after starting the drug. Contact your health care provider right away if you notice fevers or flu-like symptoms with a rash. The rash may be red or purple and then turn into blisters or peeling of the skin. Or, you might notice a red rash with swelling of the face, lips or lymph nodes  in your neck or under your arms. This drug can make you more sensitive to the sun. Keep out of the sun. If you cannot avoid being in the sun, wear protective clothing and sunscreen. Do not use sun lamps or tanning beds/booths. Be careful brushing or flossing your teeth or using a toothpick because you may get an infection or bleed more easily.  If you have any dental work done, tell your dentist you are receiving this drug. What side effects may I notice from receiving this medicine? Side effects that you should report to your doctor or health care professional as soon as possible:  allergic reactions (skin rash, itching or hives; swelling of the face, lips, or tongue)  bloody or watery diarrhea  heartbeat rhythm changes (trouble breathing; chest pain; dizziness; fast, irregular heartbeat; feeling faint or lightheaded, falls,)  fever  high potassium levels (chest pain; fast, irregular heartbeat; muscle weakness)  kidney injury (trouble passing urine or change in the amount of urine)  low blood sugar (feeling anxious; confusion; dizziness; increased hunger; unusually weak or tired; increased sweating; shakiness; cold, clammy skin; irritable; headache; blurred vision; fast heartbeat; loss of consciousness)  low red blood cell counts (trouble breathing; feeling faint; lightheaded, falls; unusually weak or tired)  rash, fever, and swollen lymph nodes  redness, blistering, peeling, or loosening of the skin, including inside the mouth  unusual bruising or bleeding Side effects that usually do not require medical attention (report to your doctor or health care professional if they continue or are bothersome):  loss of appetite  nausea  vomiting This list may not describe all possible side effects. Call your doctor for medical advice about side effects. You may report side effects to FDA at 1-800-FDA-1088. Where should I keep my medicine? Keep out of the reach of children. Store between 15 and 25 degrees C (59 to 77 degrees F). Protect from light. Keep the container tightly closed. Throw away any unused drug after the expiration date. NOTE: This sheet is a summary. It may not cover all possible information. If you have questions about this medicine, talk to your doctor, pharmacist, or health care provider.  2020 Elsevier/Gold  Standard (2018-10-14 12:53:51)

## 2019-11-07 NOTE — Progress Notes (Signed)
  HPI:      [name] is a 74 y.o. G1P1001 who LMP was No LMP recorded. Patient has had a hysterectomy., presents today for a problem visit.    Urinary Tract Infection: Patient complains of dysuria and suprapubic pressure . She has had symptoms for 3 days. Patient also complains of having UTI in June and last Nov. Patient denies back pain and fever. Patient does not have a history of recurrent UTI.  Patient does not have a history of pyelonephritis.   PMHx: She  has a past medical history of Anxiety and depression, Arthritis, Asthmatic bronchitis, Atypical nevus (01/27/2011), Atypical nevus (11/09/2012), Atypical nevus (11/19/2012), BCC (basal cell carcinoma) (10/23/2002), Bronchitis, Cancer ([place]), Depression, Dysrhythmia, Heart murmur, Hemorrhoids, History of melanoma, HLD (hyperlipidemia), Hypertension, Melanoma ([place]) (10/18/1997), Mitral valve prolapse, Osteoporosis, Personal history of malignant melanoma of skin, Superficial basal cell carcinoma (BCC) (01/10/2013), SVT (supraventricular tachycardia) ([place]), and Viral gastroenteritis. Also,  has a past surgical history that includes Endometrial ablation (1976); Melanoma excision (1999); left thumb tendon repair (10/09); Abdominal hysterectomy (1975); Colonoscopy (04/15/2004); Wrist surgery (Left, 2011); Skin cancer excision (03/02/2013); US ECHOCARDIOGRAPHY (10/16/2010); NM MYOCAR PERF WALL MOTION (12/28/2008); Cataract extraction w/PHACO (Right, 02/10/2017); and Cataract extraction w/PHACO (Left, 03/09/2017)., family history includes COPD in her father; Cancer in her brother; Emphysema in her father; Heart disease in her brother, father, mother, and sister; Lung cancer in her paternal grandfather; Ovarian cancer in her paternal aunt; Rheum arthritis in her mother; Stroke in her mother.,  reports that she quit smoking about 29 years ago. Her smoking use included cigarettes. She has a 10.00 pack-year smoking history. She has never used smokeless tobacco.  She reports that she does not drink alcohol and does not use drugs.  She has a current medication list which includes the following prescription(s): baclofen, bupropion, calcium-vitamin d, cyanocobalamin, hydrocodone-acetaminophen, lorazepam, losartan, meperidine, ondansetron, phenazopyridine, promethazine, tramadol, amlodipine, and sulfamethoxazole-trimethoprim. Also, is allergic to codeine, macrobid [nitrofurantoin], metoprolol, adhesive [tape], alendronate sodium, and verapamil.  Review of Systems  Gastrointestinal: Positive for abdominal pain.  Genitourinary: Positive for dysuria and frequency.  All other systems reviewed and are negative.   Objective: BP (!) 150/90   Ht 5\' 4"  (1.626 m)   Wt 105 lb (47.6 kg)   BMI 18.02 kg/m  Physical Exam Constitutional:      General: She is not in acute distress.    Appearance: She is well-developed.  Musculoskeletal:        General: Normal range of motion.  Neurological:     Mental Status: She is alert and oriented to person, place, and time.  Skin:    General: Skin is warm and dry.  Vitals reviewed.   Back: No CVAT  UA- POS [place]  No Blood  ASSESSMENT/PLAN:   Acute cystitis  Problem List Items Addressed This Visit      Other   Dysuria - Primary   Relevant Orders   POCT urinalysis dipstick (Completed)    Other Visit Diagnoses    Acute cystitis without hematuria       Relevant Orders   Urine Culture    Bactrim Monitor for recurrence  Did not tolerate Augmenting, Macrobid, Cipro in past  Barnett Applebaum, MD, [place], [facility] 11/07/2019  3:22 PM

## 2019-11-12 LAB — URINE CULTURE

## 2019-11-13 ENCOUNTER — Other Ambulatory Visit: Payer: Self-pay | Admitting: Obstetrics & Gynecology

## 2019-11-13 MED ORDER — SULFAMETHOXAZOLE-TRIMETHOPRIM 800-160 MG PO TABS
1.0000 | ORAL_TABLET | Freq: Two times a day (BID) | ORAL | 0 refills | Status: AC
Start: 2019-11-13 — End: 2019-11-16

## 2019-11-13 NOTE — Progress Notes (Signed)
Pt states she has already took the meds. You  sent them in when she was seen that day.

## 2019-11-13 NOTE — Progress Notes (Signed)
Let her know UTI was found on urine testing, and Bactrim has been considered as antibiotic of choice this time, and eRx.

## 2019-11-23 ENCOUNTER — Ambulatory Visit (INDEPENDENT_AMBULATORY_CARE_PROVIDER_SITE_OTHER): Payer: Medicare Other

## 2019-11-23 ENCOUNTER — Other Ambulatory Visit: Payer: Self-pay

## 2019-11-23 DIAGNOSIS — E538 Deficiency of other specified B group vitamins: Secondary | ICD-10-CM

## 2019-11-23 MED ORDER — CYANOCOBALAMIN 1000 MCG/ML IJ SOLN
1000.0000 ug | Freq: Once | INTRAMUSCULAR | Status: AC
  Administered 2019-11-23: 1000 ug via INTRAMUSCULAR

## 2019-11-23 NOTE — Progress Notes (Signed)
Patient presented for B 12 injection to right deltoid, patient voiced no concerns nor showed any signs of distress during injection. 

## 2019-12-05 ENCOUNTER — Other Ambulatory Visit: Payer: Self-pay

## 2019-12-05 ENCOUNTER — Encounter: Payer: Self-pay | Admitting: Cardiovascular Disease

## 2019-12-05 ENCOUNTER — Ambulatory Visit (INDEPENDENT_AMBULATORY_CARE_PROVIDER_SITE_OTHER): Payer: Medicare Other | Admitting: Cardiovascular Disease

## 2019-12-05 VITALS — BP 140/74 | HR 87 | Ht 63.0 in | Wt 106.8 lb

## 2019-12-05 DIAGNOSIS — I6529 Occlusion and stenosis of unspecified carotid artery: Secondary | ICD-10-CM

## 2019-12-05 DIAGNOSIS — F32A Depression, unspecified: Secondary | ICD-10-CM

## 2019-12-05 DIAGNOSIS — E785 Hyperlipidemia, unspecified: Secondary | ICD-10-CM

## 2019-12-05 DIAGNOSIS — I471 Supraventricular tachycardia: Secondary | ICD-10-CM

## 2019-12-05 DIAGNOSIS — I2510 Atherosclerotic heart disease of native coronary artery without angina pectoris: Secondary | ICD-10-CM | POA: Diagnosis not present

## 2019-12-05 DIAGNOSIS — I10 Essential (primary) hypertension: Secondary | ICD-10-CM

## 2019-12-05 DIAGNOSIS — F419 Anxiety disorder, unspecified: Secondary | ICD-10-CM

## 2019-12-05 NOTE — Patient Instructions (Signed)
Medication Instructions:  Your physician recommends that you continue on your current medications as directed. Please refer to the Current Medication list given to you today.  *If you need a refill on your cardiac medications before your next appointment, please call your pharmacy*   Lab Work: [place]   If you have labs (blood work) drawn today and your tests are completely normal, you will receive your results only by: Marland Kitchen MyChart Message (if you have MyChart) OR . A paper copy in the mail If you have any lab test that is abnormal or we need to change your treatment, we will call you to review the results.   Testing/Procedures: NONE   Follow-Up: At [facility], you and your health needs are our priority.  As part of our continuing mission to provide you with exceptional heart care, we have created designated Provider Care Teams.  These Care Teams include your primary Cardiologist (physician) and Advanced Practice Providers (APPs -  Physician Assistants and Nurse Practitioners) who all work together to provide you with the care you need, when you need it.  We recommend signing up for the patient portal called "MyChart".  Sign up information is provided on this After Visit Summary.  MyChart is used to connect with patients for Virtual Visits (Telemedicine).  Patients are able to view lab/test results, encounter notes, upcoming appointments, etc.  Non-urgent messages can be sent to your provider as well.   To learn more about what you can do with MyChart, go to [email or web address].    Your next appointment:   12 month(s)  The format for your next appointment:   In Person  Provider:   You may see Shelva Majestic, MD or one of the following Advanced Practice Providers on your designated Care Team:    Almyra Deforest, PA-C  Fabian Sharp, PA-C or   Roby Lofts, [place]    Other Instructions

## 2019-12-05 NOTE — Progress Notes (Signed)
Patient ID: Chloe Perkins, female   DOB: 08/31/1945, 74 y.o.   MRN: [id number]    Primary M.D.: Dr. Deborra Medina  HPI: Chloe Perkins is a 74 y.o. female who presents to the office for a 4 month cardiology follow-up evaluation.  Chloe Perkins has a history of SVT and hypertension, for which she has been on diltiazem at 120 mg  and losartan 50 mg daily.  She has  also remote history of thyroid abnormality and in the past  had taken levothyroxine.  She has mild renal insufficiency with stage III renal disease.  Has a history of anxiety/depression for which she takes Wellbutrin XL 300 mg daily.  She also is on calcium with vitamin D in light of osteoporosis.  Recently, she believes that she has been experiencing more episodes of palpitations which are short-lived and at times occur daily.  She denies associated presyncope or syncope.  She denies chest pain.  She recently underwent a panoramix dental x-ray and was told by her dentist that she may have calcification of her carotids.   She has a history of osteoporosis, as well as vitamin B12 deficiency and has previously been found to have stable solitary pulmonary nodule.  She had a follow-up echo Doppler study in November 2016 which showed an ejection fraction at 60-65%.  She had normal diastolic parameters.  There was turbulent flow at the base of her aortic valve in the region of the right coronary cusp which was most likely the cause of her systolic murmur.    I last saw her, she was experiencing  transient palpitations lasting less than 5 seconds, almost on a daily basis.  She has been using caffeine.  She underwent a carotid duplex study in July 2017 which showed heterogeneous plaque bilaterally and 1-39% bilateral ICA stenoses with normal subclavian arteries, and patent vertebral arteries with antegrade flow.  I saw her, I further titrated her Cardizem and suggested that she take 240 mg at bedtime.  She's continued to take losartan 75 mg in the  morning.  Since I  saw her in February 2018, she states that she has continued to experience short-lived episodes of asymptomatic palpitations which may last anywhere from 5-10 seconds, but seemed to occur on a daily basis.  She has been on slow acting diltiazem at 240 mg daily and has continued to take losartan 75 mg daily for hypertension.  She also is on Ativan and Wellbutrin.   She underwent a CT scan by Dr. Deborra Medina.  She was found to have a stable 1 cm nodule in the right lower lobe that was unchanged from previously.  Incidentally, she was also noted to have coronary artery calcification suggestive of CAD.  Once I found out about this, I had contacted her and recommended initiation of Crestor at an initial very low dose of 10 mg.  Since she was having almost daily palpitations when I saw her several weeks ago  I also attempted to add very low-dose metoprolol succinate at 12.5 mg.  Her heart rate had dropped into the upper 40s with this and she stopped taking it; however, toprol did improve her daily palpitations.    When I saw her in October 2018 I had a lengthy discussion with her regarding the fact that she has demonstration of subclinical atherosis in the importance of trying to induce plaque regression.  After much discussion, she agreed to initiate low-dose statin therapy.  She was started on low-dose Crestor.  Apparently,  she self discontinued this since she felt this caused her to be nauseated.  On for brief 14 2018.  Repeat laboratory off treatment showed a total cholesterol 2:15, HDL 87, LDL 114, and triglycerides 71.    When I saw her in March 2019 she had discontinued Crestor because of feeling nauseated which she attributed to Crestor and I initiated Zetia.  She saw [name] on Jul 06, 2017 as an add-on for slow heart rate.  Heart rate on his exam was 48.  He decreased diltiazem back to 120 mg daily   Apparently, Chloe Perkins did not feel well on the 120 of diltiazem and for this  reason went back to take her previous 180 mg dose.  She had noticed her recent pressure elevation and also had noticed a mild headache.  She took the reduced dose ifor 5 days and then went back to 180 mg with feeling improved.     When I saw her on July 29, 2017 for follow-up her blood pressure was elevated on losartan 100 mg daily.  At that time I suggested she discontinue diltiazem and changed her to metoprolol tartrate 25 mg twice a day depending upon her heart rate.  Her ECG during that evaluation showed a bigeminal rhythm with alternating sinus and low atrial beats.  She was tolerating Zetia for hyperlipidemia in light of her documentation of subclinical atherosclerosis.  Apparently, she had called the office and was having issues with elevated blood pressure and slow heart rate.  During my absence, her metoprolol was stopped and she was started on amlodipine 5 mg daily and to use the metoprolol 12.5 mg on an as-needed basis.  She wore a  Monitor for 5 days saw Dr. Caryl Comes for follow-up evaluation.  She is unaware of any recurrent episodes of tachycardia.  She denies presyncope or syncope.  I  saw her in March 2020 at which time she was doing well and denied any recurrent episodes of palpitations.She wason amlodipine 5 mg in addition to losartan 100 mg hypertension. I had started her on Zetia for hyperlipidemia but apparently she wasnot taking this.. She continuedto take bupropion 450 mg for depression. She had seen Dr. Deborra Medina for annual exam and laboratory from that date was reviewed. Recent lipid panel in November 2019 was 89 with an HDL of 85.   I saw her in December 2020 at which time she felt that she was stable from a cardiac standpoint.  She denied any chest pain but had noted some shortness of breath with step walking.   Since my evaluation she was seen by Dr. Caryl Comes on February 09, 2019.  Pulse was 59 and blood pressure was stable.  She had documented PACs and he reassured her  that her PACs were benign.  There was no documentation of atrial fibrillation.  She was last evaluated by me in a telemedicine visit on August 02, 2019. Over the prior 6 months, she had experienced some episodes of shortness of breath with activity.  She continued to take Wellbutrin for depression.  She underwent evaluation of thyroid function and TSH was 6.7.  Presently she denies chest pain or shortness of breath.  She had not had recent labs.  She had not had a recent echo since 2016 and with her exertional dyspnea I have recommended that she undergo a follow-up echo evaluation.  She underwent an echo Doppler study on August 25 2019.  This showed an EF of 60 to 65%.  Diastolic parameters  were normal.  She had normal pulmonary artery systolic pressure.  Presently, she feels well.  She is not exercising.  She has had some lumbar spine issues and several weeks ago had undergone gone on a steroid injection with some improvement.  She is unaware of any recurrent palpitations.  She presents for follow-up evaluation.     Past Medical History:  Diagnosis Date  . Anxiety and depression   . Arthritis   . Asthmatic bronchitis   . Atypical nevus 01/27/2011   right side abdomen-severe atypia  . Atypical nevus 11/09/2012   moderate atypia-left upper back  . Atypical nevus 11/19/2012   moderate atypia-left forearm  . BCC (basal cell carcinoma) 10/23/2002   left bridge nose-Bcc with sclerosis  . Bronchitis    history of  . Cancer ([place])    MELANOMA  . Depression   . Dysrhythmia   . Heart murmur   . Hemorrhoids   . History of melanoma   . HLD (hyperlipidemia)   . Hypertension   . Melanoma ([place]) 10/18/1997   right post sholder-melanoma level 1  . Mitral valve prolapse   . Osteoporosis   . Personal history of malignant melanoma of skin   . Superficial basal cell carcinoma (BCC) 01/10/2013   right cheek  . SVT (supraventricular tachycardia) ([place])   . Viral gastroenteritis     Past Surgical  History:  Procedure Laterality Date  . ABDOMINAL HYSTERECTOMY  1975  . CATARACT EXTRACTION W/PHACO Right 02/10/2017   Procedure: CATARACT EXTRACTION PHACO AND INTRAOCULAR LENS PLACEMENT (IOC);  Surgeon: Birder Robson, MD;  Location: [facility] ORS;  Service: Ophthalmology;  Laterality: Right;  [place] 00:35.9 AP% 11.5 CDE 4.11 Fluid Pack Lot # [id number]  . CATARACT EXTRACTION W/PHACO Left 03/09/2017   Procedure: CATARACT EXTRACTION PHACO AND INTRAOCULAR LENS PLACEMENT (IOC);  Surgeon: Birder Robson, MD;  Location: [facility] ORS;  Service: Ophthalmology;  Laterality: Left;  [place] 00:23 AP% 16.3 CDE 3.85 Fluid pack lot # [id number]  . COLONOSCOPY  04/15/2004   2006: Normal  . ENDOMETRIAL ABLATION  1976  . left thumb tendon repair  10/09  . MELANOMA EXCISION  1999   superficial spreading melanoma right post shoulder  . NM MYOCAR PERF WALL MOTION  12/28/2008   No ischemia  . SKIN CANCER EXCISION  03/02/2013   superficial basal cell cancer  . US ECHOCARDIOGRAPHY  10/16/2010   mitral valve leaflets midly thickened,trace MR.  . WRIST SURGERY Left 2011    Allergies  Allergen Reactions  . Codeine Nausea And Vomiting  . Macrobid [Nitrofurantoin]     Upsets stomach.  . Metoprolol     Severe bradycardia   . Adhesive [Tape] Rash    PAPER TAPE ONLY (INTOLERANCE TO LEADS--EKG)  . Alendronate Sodium Other (See Comments)    REACTION: pt states INTOL to Fosamax  . Verapamil Other (See Comments)    REACTION: Intol w/ bradycardia    Current Outpatient Medications  Medication Sig Dispense Refill  . baclofen (LIORESAL) 10 MG tablet Take 0.5-1 tablets (5-10 mg total) by mouth 3 (three) times daily as needed for muscle spasms. 30 each 3  . buPROPion (WELLBUTRIN XL) 150 MG 24 hr tablet Take 450 mg by mouth daily.    . calcium-vitamin D (OSCAL WITH D) 500-200 MG-UNIT per tablet Take 1 tablet by mouth 2 (two) times daily.      . cyanocobalamin (,VITAMIN B-12,) 1000 MCG/ML injection Inject 1,000 mcg into the muscle  every 30 (thirty) days.    Marland Kitchen  HYDROcodone-acetaminophen (NORCO/VICODIN) 5-325 MG tablet Take 1 tablet by mouth every 6 (six) hours as needed for moderate pain. 20 tablet 0  . LORazepam (ATIVAN) 0.5 MG tablet Take 0.5 mg by mouth See admin instructions. TAKE 1 TABLET (0.5 MG) SCHEDULED IN THE MORNING & 1 TABLET (0.5 MG) IN THE EVENING AS NEEDED FOR ANXIETY.  5  . losartan (COZAAR) 100 MG tablet Take 1 tablet (100 mg total) by mouth daily. 90 tablet 3  . meperidine (DEMEROL) 50 MG tablet Take 1 tablet (50 mg total) by mouth every 6 (six) hours as needed for severe pain. 30 tablet 0  . ondansetron (ZOFRAN ODT) 4 MG disintegrating tablet Take 1 tablet (4 mg total) by mouth every 8 (eight) hours as needed for nausea or vomiting. 30 tablet 1  . phenazopyridine (PYRIDIUM) 100 MG tablet Take 1 tablet (100 mg total) by mouth 3 (three) times daily as needed for pain. 8 tablet 0  . promethazine (PHENERGAN) 25 MG tablet Take 1 tablet (25 mg total) by mouth every 6 (six) hours as needed for nausea or vomiting. 1/2 to 1 every 6 hours prn nausea 20 tablet 1  . traMADol (ULTRAM) 50 MG tablet Take 1-2 tablets (50-100 mg total) by mouth every 6 (six) hours as needed. 30 tablet 0  . amLODipine (NORVASC) 5 MG tablet Take 1 tablet (5 mg total) by mouth daily. 90 tablet 3   No current facility-administered medications for this visit.    Social History   Socioeconomic History  . Marital status: Married    Spouse name: sammy Verdi  . Number of children: 1  . Years of education: Not on file  . Highest education level: Not on file  Occupational History  . Occupation: Retired  Tobacco Use  . Smoking status: Former Smoker    Packs/day: 0.50    Years: 20.00    Pack years: 10.00    Types: Cigarettes    Quit date: 02/09/1990    Years since quitting: 29.8  . Smokeless tobacco: Never Used  Vaping Use  . Vaping Use: Never used  Substance and Sexual Activity  . Alcohol use: No    Alcohol/week: 0.0 standard drinks    . Drug use: No  . Sexual activity: Yes  Other Topics Concern  . Not on file  Social History Narrative   She lives her family, she has one child, retired, no smoke no drink   Social Determinants of Radio broadcast assistant Strain:   . Difficulty of Paying Living Expenses: Not on file  Food Insecurity:   . Worried About Charity fundraiser in the Last Year: Not on file  . Ran Out of Food in the Last Year: Not on file  Transportation Needs:   . Lack of Transportation (Medical): Not on file  . Lack of Transportation (Non-Medical): Not on file  Physical Activity:   . Days of Exercise per Week: Not on file  . Minutes of Exercise per Session: Not on file  Stress:   . Feeling of Stress : Not on file  Social Connections:   . Frequency of Communication with Friends and Family: Not on file  . Frequency of Social Gatherings with Friends and Family: Not on file  . Attends Religious Services: Not on file  . Active Member of Clubs or Organizations: Not on file  . Attends Archivist Meetings: Not on file  . Marital Status: Not on file  Intimate Partner Violence:   . Fear  of Current or Ex-Partner: Not on file  . Emotionally Abused: Not on file  . Physically Abused: Not on file  . Sexually Abused: Not on file    Family History  Problem Relation Age of Onset  . Emphysema Father        PGF, brother, sister  . COPD Father        PGF, brother, sister  . Heart disease Father   . Rheum arthritis Mother   . Stroke Mother   . Heart disease Mother        Father, brother, sister  . Lung cancer Paternal Grandfather   . Ovarian cancer Paternal Aunt   . Cancer Brother   . Heart disease Brother   . Heart disease Sister   . Breast cancer Neg Hx     ROS General: Negative; No fevers, chills, or night sweats;  HEENT: Negative; No changes in vision or hearing, sinus congestion, difficulty swallowing Pulmonary: Stable lung nodule Cardiovascular: see HPI GI: Negative; No nausea,  vomiting, diarrhea, or abdominal pain GU: Negative; No dysuria, hematuria, or difficulty voiding Musculoskeletal: she complains of chronic back pain; status post recent spinal injection Hematologic/Oncology: Negative; no easy bruising, bleeding Endocrine: Negative; no heat/cold intolerance; no diabetes Neuro: Negative; no changes in balance, headaches Skin: Negative; No rashes or skin lesions Psychiatric: Positive for anxiety/depression on Wellbutrin Sleep: Negative; No snoring, daytime sleepiness, hypersomnolence, bruxism, restless legs, hypnogognic hallucinations, no cataplexy Other comprehensive 14 point system review is negative.   PE BP 140/74   Pulse 87   Ht _0  (1.6 m)   Wt 106 lb 12.8 oz (48.4 kg)   SpO2 98%   BMI 18.92 kg/m    Repeat blood pressure by me was 124/74  Wt Readings from Last 3 Encounters:  12/05/19 106 lb 12.8 oz (48.4 kg)  11/07/19 105 lb (47.6 kg)  08/02/19 110 lb (49.9 kg)   General: Alert, oriented, no distress.  Skin: normal turgor, no rashes, warm and dry HEENT: Normocephalic, atraumatic. Pupils equal round and reactive to light; sclera anicteric; extraocular muscles intact;  Nose without nasal septal hypertrophy Mouth/Parynx benign; Mallinpatti scale 2 Neck: No JVD, no carotid bruits; normal carotid upstroke Lungs: clear to ausculatation and percussion; no wheezing or rales Chest wall: without tenderness to palpitation Heart: PMI not displaced, RRR, s1 s2 normal, 1/6 systolic murmur, no diastolic murmur, no rubs, gallops, thrills, or heaves Abdomen: soft, nontender; no hepatosplenomehaly, BS+; abdominal aorta nontender and not dilated by palpation. Back: no CVA tenderness Pulses 2+ Musculoskeletal: full range of motion, normal strength, no joint deformities Extremities: no clubbing cyanosis or edema, Homan's sign negative  Neurologic: grossly nonfocal; Cranial nerves grossly wnl Psychologic: Normal mood and affect  ECG (independently read by  me): NSR at 87; no ectopy; normal intervals  January 10, 2019 ECG (independently read by me): Normal sinus rhythm at 89 bpm.  Low voltage.  Small Q-wave in lead III.  No ectopy.  Normal intervals.  April 11, 2018 ECG (independently read by me): Sinus bradycardia with mild sinus arrhythmia, rate 51.  Low voltage.  Normal intervals.  No ectopy.  August 31, 2017 ECG (independently read by me): Normal sinus rhythm 80 bpm.  No ectopy.  Mild RV conduction delay.  Normal intervals.  July 29, 2017 ECG (independently read by me): Bigeminy rhythm with alternating sinus and low atrial beat heart rate 60 bpm.  May 05, 2017 ECG (independently read by me): normal sinus rhythm at 62 with a PAC.  Low  voltage.  Normal intervals.  October 2018 ECG (independently read by me): Sinus rhythm at 65 bpm, PAC.  PR interval 128 ms, QTc interval 428 ms.  September 2018 ECG (independently read by me): Normal sinus rhythm at 64 bpm, PA-C, mild RV conduction delay.  Nonspecific ST changes.  February 2017 ECG (independently read by me): Sinus bradycardia 53 bpm..  Mild RV conduction delay.  Normal intervals.  November 2017 ECG (independently read by me): Normal sinus rhythm at 61 bpm.  No ectopy.  Normal intervals.  July 2017 ECG (independently read by me): Sinus rhythm at 60 bpm with PAC.  Normal intervals.  November 2016 ECG (independently read by me): Normal sinus rhythm at 66 bpm.  Mild RV conduction delay.  Nondiagnostic small inferior Q waves.  ECG (independently read by me): sinus bradycardia 50 bpm.  Mild RV conduction delay  November 2015 ECG (independently read by me0;  Normal sinus rhythm at 77 bpm.  RV conduction delay.  Nondiagnostic small Q waves.  Nonspecific ST changes.  April 2015 ECG (independently read by me): Sinus bradycardia 54 beats per minute.  Mild RV conduction delay.  No ectopy.  LABS: BMP Latest Ref Rng & Units 12/05/2019 12/16/2017 05/05/2017  Glucose 65 - 99 mg/dL 82 94 98  BUN 8 - 27  mg/dL _0 Creatinine 0.57 - 1.00 mg/dL 1.01(H) 1.01 1.07(H)  BUN/Creat Ratio 12 - 28 21 - -  Sodium 134 - 144 mmol/L 141 140 139  Potassium 3.5 - 5.2 mmol/L 4.8 4.6 4.4  Chloride 96 - 106 mmol/L 102 103 106  CO2 20 - 29 mmol/L _1 Calcium 8.7 - 10.3 mg/dL 10.1 10.7(H) 9.2   Hepatic Function Latest Ref Rng & Units 12/05/2019 12/16/2017 05/05/2017  Total Protein 6.0 - 8.5 g/dL 6.9 7.4 7.3  Albumin 3.7 - 4.7 g/dL 4.5 4.6 4.2  AST 0 - 40 IU/L _2 ALT 0 - 32 IU/L 13 13 13(L)  Alk Phosphatase 44 - 121 IU/L 87 60 64  Total Bilirubin 0.0 - 1.2 mg/dL 0.3 0.5 0.6  Bilirubin, Direct 0.0 - 0.3 mg/dL - - -   CBC Latest Ref Rng & Units 12/05/2019 02/09/2019 03/25/2016  WBC 3.4 - 10.8 x10E3/uL 4.7 4.4 3.4(L)  Hemoglobin 11.1 - 15.9 g/dL 13.5 13.8 13.8  Hematocrit 34.0 - 46.6 % 40.3 40.9 40.5  Platelets 150 - 450 x10E3/uL 181 175 170   Lab Results  Component Value Date   MCV 97 12/05/2019   MCV 94 02/09/2019   MCV 95.1 03/25/2016   Lab Results  Component Value Date   TSH 5.600 (H) 12/05/2019  No results found for: HGBA1C  Lipid Panel     Component Value Date/Time   CHOL 201 (H) 12/05/2019 1436   CHOL 203 (H) 05/22/2011 0759   TRIG 63 12/05/2019 1436   TRIG 65 05/22/2011 0759   HDL 101 12/05/2019 1436   HDL 88 (H) 05/22/2011 0759   CHOLHDL 2.0 12/05/2019 1436   CHOLHDL 2 12/16/2017 1346   VLDL 16.6 12/16/2017 1346   VLDL 13 05/22/2011 0759   LDLCALC 89 12/05/2019 1436   LDLCALC 102 (H) 05/22/2011 0759   LDLDIRECT 102.2 04/23/2006 1211     RADIOLOGY: No results found.  IMPRESSION:  1. Essential hypertension   2. Hyperlipidemia with target LDL less than 70   3. SVT (supraventricular tachycardia) ([place])   4. Coronary artery calcification seen on CT scan   5. Carotid artery plaque, unspecified  laterality   6. Anxiety and depression     ASSESSMENT AND PLAN: Chloe Perkins is a 74 year-old female who has a history of SVT as well as hypertension. Remotely  she had experiencing more frequent palpitations which led to further dose escalation of Cardizem up to 240 mg with improvement and due to blood pressure issues losartan was titrated up to 100 mg.  She had developed bradycardia and an attempt was made to reduce diltiazem but she felt this contributed to her increased blood pressure and did not feel well.  On diltiazem, low-dose metoprolol had been added but due to bradycardia this was discontinued.  Later, diltiazem substituted by amlodipine and blood pressure improved.  Her blood pressure today is stable.  Patient was insisting on amlodipine 5 mg, losartan 100 mg daily.  Apparently she is no longer taking metoprolol.  I reviewed her echo Doppler study from August 25 2019 which showed normal systolic and diastolic function.  She had normal pulmonary artery pressures.  There was no significant valvular issues.  She was having issues with low back discomfort making it very difficult to exercise and be active.  Approximately 4 to 6 weeks ago she did undergo a spinal steroid injection.  She is followed by Dr. Derrel Nip for primary care who will be checking laboratory.  She has a history of mild carotid plaque and coronary calcification on CT imaging.  She denies any chest pain.  Remotely I had recommended initiation of Zetia with her LDL at 89 but this was never done.  She has a history of depression and continues to be on bupropion 150 mg.  She is sleeping well.  She will be following up with Dr. Derrel Nip.  As long as she remains stable I will see her in 1 year for follow-up evaluation   Troy Sine, MD, [facility]  12/06/2019 8:36 AM

## 2019-12-06 ENCOUNTER — Encounter: Payer: Self-pay | Admitting: Cardiovascular Disease

## 2019-12-06 LAB — COMPREHENSIVE METABOLIC PANEL
ALT: 13 IU/L (ref 0–32)
AST: 24 IU/L (ref 0–40)
Albumin/Globulin Ratio: 1.9 (ref 1.2–2.2)
Albumin: 4.5 g/dL (ref 3.7–4.7)
Alkaline Phosphatase: 87 IU/L (ref 44–121)
BUN/Creatinine Ratio: 21 (ref 12–28)
BUN: 21 mg/dL (ref 8–27)
Bilirubin Total: 0.3 mg/dL (ref 0.0–1.2)
CO2: 25 mmol/L (ref 20–29)
Calcium: 10.1 mg/dL (ref 8.7–10.3)
Chloride: 102 mmol/L (ref 96–106)
Creatinine, Ser: 1.01 mg/dL — ABNORMAL HIGH (ref 0.57–1.00)
GFR calc Af Amer: 63 mL/min/{1.73_m2} (ref >59)
GFR calc non Af Amer: 55 mL/min/{1.73_m2} — ABNORMAL LOW (ref >59)
Globulin, Total: 2.4 g/dL (ref 1.5–4.5)
Glucose: 82 mg/dL (ref 65–99)
Potassium: 4.8 mmol/L (ref 3.5–5.2)
Sodium: 141 mmol/L (ref 134–144)
Total Protein: 6.9 g/dL (ref 6.0–8.5)

## 2019-12-06 LAB — CBC
Hematocrit: 40.3 % (ref 34.0–46.6)
Hemoglobin: 13.5 g/dL (ref 11.1–15.9)
MCH: 32.4 pg (ref 26.6–33.0)
MCHC: 33.5 g/dL (ref 31.5–35.7)
MCV: 97 fL (ref 79–97)
Platelets: 181 10*3/uL (ref 150–450)
RBC: 4.17 x10E6/uL (ref 3.77–5.28)
RDW: 12 % (ref 11.7–15.4)
WBC: 4.7 10*3/uL (ref 3.4–10.8)

## 2019-12-06 LAB — LIPID PANEL
Chol/HDL Ratio: 2 ratio (ref 0.0–4.4)
Cholesterol, Total: 201 mg/dL — ABNORMAL HIGH (ref 100–199)
HDL: 101 mg/dL (ref >39)
LDL Chol Calc (NIH): 89 mg/dL (ref 0–99)
Triglycerides: 63 mg/dL (ref 0–149)
VLDL Cholesterol Cal: 11 mg/dL (ref 5–40)

## 2019-12-06 LAB — TSH: TSH: 5.6 u[IU]/mL — ABNORMAL HIGH (ref 0.450–4.500)

## 2019-12-27 ENCOUNTER — Ambulatory Visit (INDEPENDENT_AMBULATORY_CARE_PROVIDER_SITE_OTHER): Payer: Medicare Other

## 2019-12-27 ENCOUNTER — Other Ambulatory Visit: Payer: Self-pay

## 2019-12-27 DIAGNOSIS — E538 Deficiency of other specified B group vitamins: Secondary | ICD-10-CM

## 2019-12-27 MED ORDER — CYANOCOBALAMIN 1000 MCG/ML IJ SOLN
1000.0000 ug | Freq: Once | INTRAMUSCULAR | Status: AC
  Administered 2019-12-27: 1000 ug via INTRAMUSCULAR

## 2019-12-27 NOTE — Progress Notes (Signed)
Patient presented for B 12 injection to right deltoid, patient voiced no concerns nor showed any signs of distress during injection. 

## 2020-01-16 ENCOUNTER — Ambulatory Visit (INDEPENDENT_AMBULATORY_CARE_PROVIDER_SITE_OTHER): Payer: Medicare Other | Admitting: Family Medicine

## 2020-01-16 ENCOUNTER — Other Ambulatory Visit: Payer: Self-pay

## 2020-01-16 ENCOUNTER — Encounter: Payer: Self-pay | Admitting: Family Medicine

## 2020-01-16 DIAGNOSIS — M25511 Pain in right shoulder: Secondary | ICD-10-CM

## 2020-01-16 MED ORDER — VITAMIN D-3 125 MCG (5000 UT) PO TABS: 1.0000 | ORAL_TABLET | Freq: Every day | ORAL | 3 refills | Status: DC

## 2020-01-16 MED ORDER — ACETAMINOPHEN-CODEINE #3 300-30 MG PO TABS: 1.0000 | ORAL_TABLET | Freq: Four times a day (QID) | ORAL | 0 refills | Status: DC | PRN

## 2020-01-16 NOTE — Progress Notes (Signed)
Office Visit Note   Patient: Chloe Perkins           Date of Birth: 11/25/1945           MRN: [id number] Visit Date: 01/16/2020 Requested by: [name], MD [address] [place],  [place] [id number] PCP: [name], MD  Subjective: Chief Complaint  Patient presents with  . Right Shoulder - Pain    Pain below the right scapula and down the back to her waist, x 3-4 days. Constant. NKI   . Middle Back - Pain    HPI: She is here with right upper back/shoulder pain.  Symptoms started 3 to 4 days ago, no injury.  Pain in the shoulder blade with radiation upward toward the neck and a little bit downward.  No arm radicular symptoms, no numbness or weakness.  The pain kept her awake last night.  I saw her for similar pain in 2020.  She does not recall that event, and at some point she became pain-free until now.              ROS:   All other systems were reviewed and are negative.  Objective: Vital Signs: There were no vitals taken for this visit.  Physical Exam:  General:  Alert and oriented, in no acute distress. Pulm:  Breathing unlabored. Psy:  Normal mood, congruent affect. Skin: No rash Right shoulder: She has full neck range of motion pain-free, negative Spurling's test.  Full shoulder range of motion with 5/5 upper extremity strength and 2+ DTRs.  She has a tender trigger point in the right rhomboid area that reproduces her pain.  Imaging: No results found.  Assessment & Plan: 1.  Myofascial right upper back pain -Discussed options and she would like to try a trigger point injection.  We will also prescribe Tylenol 3 as needed, and presumptively treat with vitamin D3.     Procedures: Right upper back trigger point injection: After sterile prep with Betadine, injected 1 cc 0.25% bupivacaine and 6 mg betamethasone.    PMFS History: Patient Active Problem List   Diagnosis Date Noted  . PAC (premature atrial contraction) 02/08/2019  . Weight  loss 12/08/2017  . Urinary hesitancy 10/25/2017  . Tachycardia-bradycardia syndrome ([place]) 07/06/2017  . CAD (coronary artery disease), native coronary artery 07/06/2017  . Acute bilateral thoracic back pain 12/18/2016  . 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 Solitary pulmonary nodule 05/04/2015  . B12 deficiency 05/04/2015  . Orthostasis 03/19/2015  . Dizziness and giddiness 03/19/2015  . Encounter for preventive health examination 03/19/2015  . Dysuria 02/07/2015  . S/P hysterectomy 06/26/2013  . CKD (chronic kidney disease) stage 3, GFR 30-59 ml/min (HCC) 06/26/2013  . Hypothyroidism 06/26/2013  . SVT (supraventricular tachycardia) ([place]) 05/29/2013  . Mitral valve prolapse   . HLD (hyperlipidemia)   . Anxiety and depression 09/03/2010  . IBS (irritable bowel syndrome) 09/03/2010  . HYPERCHOLESTEROLEMIA, BORDERLINE 02/06/2008  . Essential hypertension 02/06/2008  . Osteoporosis 02/06/2008  . PERSONAL HISTORY OF MALIGNANT MELANOMA OF SKIN 02/06/2008   Past Medical History:  Diagnosis Date  . Anxiety and depression   . Arthritis   . Asthmatic bronchitis   . Atypical nevus 01/27/2011   right side abdomen-severe atypia  . Atypical nevus 11/09/2012   moderate atypia-left upper back  . Atypical nevus 11/19/2012   moderate atypia-left forearm  . BCC (basal cell carcinoma) 10/23/2002   left bridge nose-Bcc with sclerosis  . Bronchitis  history of  . Cancer ([place])    MELANOMA  . Depression   . Dysrhythmia   . Heart murmur   . Hemorrhoids   . History of melanoma   . HLD (hyperlipidemia)   . Hypertension   . Melanoma ([place]) 10/18/1997   right post sholder-melanoma level 1  . Mitral valve prolapse   . Osteoporosis   . Personal history of malignant melanoma of skin   . Superficial basal cell carcinoma (BCC) 01/10/2013   right cheek  . SVT (supraventricular tachycardia) ([place])   . Viral gastroenteritis     Family History  Problem Relation Age of Onset  .  Emphysema Father        PGF, brother, sister  . COPD Father        PGF, brother, sister  . Heart disease Father   . Rheum arthritis Mother   . Stroke Mother   . Heart disease Mother        Father, brother, sister  . Lung cancer Paternal Grandfather   . Ovarian cancer Paternal Aunt   . Cancer Brother   . Heart disease Brother   . Heart disease Sister   . Breast cancer Neg Hx     Past Surgical History:  Procedure Laterality Date  . ABDOMINAL HYSTERECTOMY  1975  . CATARACT EXTRACTION W/PHACO Right 02/10/2017   Procedure: CATARACT EXTRACTION PHACO AND INTRAOCULAR LENS PLACEMENT (IOC);  Surgeon: Birder Robson, MD;  Location: [facility] ORS;  Service: Ophthalmology;  Laterality: Right;  [place] 00:35.9 AP% 11.5 CDE 4.11 Fluid Pack Lot # [id number]  . CATARACT EXTRACTION W/PHACO Left 03/09/2017   Procedure: CATARACT EXTRACTION PHACO AND INTRAOCULAR LENS PLACEMENT (IOC);  Surgeon: Birder Robson, MD;  Location: [facility] ORS;  Service: Ophthalmology;  Laterality: Left;  [place] 00:23 AP% 16.3 CDE 3.85 Fluid pack lot # [id number]  . COLONOSCOPY  04/15/2004   2006: Normal  . ENDOMETRIAL ABLATION  1976  . left thumb tendon repair  10/09  . MELANOMA EXCISION  1999   superficial spreading melanoma right post shoulder  . NM MYOCAR PERF WALL MOTION  12/28/2008   No ischemia  . SKIN CANCER EXCISION  03/02/2013   superficial basal cell cancer  . US ECHOCARDIOGRAPHY  10/16/2010   mitral valve leaflets midly thickened,trace MR.  . WRIST SURGERY Left 2011   Social History   Occupational History  . Occupation: Retired  Tobacco Use  . Smoking status: Former Smoker    Packs/day: 0.50    Years: 20.00    Pack years: 10.00    Types: Cigarettes    Quit date: 02/09/1990    Years since quitting: 29.9  . Smokeless tobacco: Never Used  Vaping Use  . Vaping Use: Never used  Substance and Sexual Activity  . Alcohol use: No    Alcohol/week: 0.0 standard drinks  . Drug use: No  . Sexual activity: Yes

## 2020-01-26 ENCOUNTER — Other Ambulatory Visit: Payer: Self-pay

## 2020-01-26 ENCOUNTER — Ambulatory Visit (INDEPENDENT_AMBULATORY_CARE_PROVIDER_SITE_OTHER): Payer: Medicare Other

## 2020-01-26 DIAGNOSIS — E538 Deficiency of other specified B group vitamins: Secondary | ICD-10-CM | POA: Diagnosis not present

## 2020-01-26 MED ORDER — CYANOCOBALAMIN 1000 MCG/ML IJ SOLN
1000.0000 ug | Freq: Once | INTRAMUSCULAR | Status: AC
  Administered 2020-01-26: 1000 ug via INTRAMUSCULAR

## 2020-01-26 NOTE — Progress Notes (Addendum)
Patient presented for B 12 injection to left deltoid, patient voiced no concerns nor showed any signs of distress during injection.  Reviewed.  Dr Scott 

## 2020-02-15 ENCOUNTER — Ambulatory Visit (INDEPENDENT_AMBULATORY_CARE_PROVIDER_SITE_OTHER): Payer: Medicare Other | Admitting: Dermatology

## 2020-02-15 ENCOUNTER — Other Ambulatory Visit: Payer: Self-pay

## 2020-02-15 DIAGNOSIS — D485 Neoplasm of uncertain behavior of skin: Secondary | ICD-10-CM | POA: Diagnosis not present

## 2020-02-15 DIAGNOSIS — L821 Other seborrheic keratosis: Secondary | ICD-10-CM

## 2020-02-15 DIAGNOSIS — L820 Inflamed seborrheic keratosis: Secondary | ICD-10-CM | POA: Diagnosis not present

## 2020-02-15 DIAGNOSIS — L578 Other skin changes due to chronic exposure to nonionizing radiation: Secondary | ICD-10-CM | POA: Diagnosis not present

## 2020-02-15 NOTE — Progress Notes (Signed)
Follow-Up Visit   Subjective  Chloe Perkins is a 75 y.o. female who presents for the following: Lesion (On the back - present for three months, will not resolve, and sore feeling. She also noticed a scaly lesion on her chest that she would like examined today.).  The following portions of the chart were reviewed this encounter and updated as appropriate:   Tobacco  Allergies  Meds  Problems  Med Hx  Surg Hx  Fam Hx     Review of Systems:  No other skin or systemic complaints except as noted in HPI or Assessment and Plan.  Objective  Well appearing patient in no apparent distress; mood and affect are within normal limits.  A focused examination was performed including the chest and back. Relevant physical exam findings are noted in the Assessment and Plan.  Objective  L upper back paraspinal: 1.3 cm hyperkeratotic papule   Objective  Chest: Erythematous keratotic or waxy stuck-on papule or plaque.   Assessment & Plan  Neoplasm of uncertain behavior of skin L upper back paraspinal  Epidermal / dermal shaving  Lesion diameter (cm):  1.3 Informed consent: discussed and consent obtained   Timeout: patient name, date of birth, surgical site, and procedure verified   Procedure prep:  Patient was prepped and draped in usual sterile fashion Prep type:  Isopropyl alcohol Anesthesia: the lesion was anesthetized in a standard fashion   Anesthetic:  1% lidocaine w/ epinephrine 1-100,000 buffered w/ 8.4% NaHCO3 Instrument used: flexible razor blade   Hemostasis achieved with: pressure, aluminum chloride and electrodesiccation   Outcome: patient tolerated procedure well   Post-procedure details: sterile dressing applied and wound care instructions given   Dressing type: bandage and petrolatum    Destruction of lesion Complexity: extensive   Destruction method: electrodesiccation and curettage   Informed consent: discussed and consent obtained   Timeout:  patient name,  date of birth, surgical site, and procedure verified Procedure prep:  Patient was prepped and draped in usual sterile fashion Prep type:  Isopropyl alcohol Anesthesia: the lesion was anesthetized in a standard fashion   Anesthetic:  1% lidocaine w/ epinephrine 1-100,000 buffered w/ 8.4% NaHCO3 Curettage performed in three different directions: Yes   Electrodesiccation performed over the curetted area: Yes   Lesion length (cm):  1.3 Lesion width (cm):  1.3 Margin per side (cm):  0.2 Final wound size (cm):  1.7 Hemostasis achieved with:  pressure, aluminum chloride and electrodesiccation Outcome: patient tolerated procedure well with no complications   Post-procedure details: sterile dressing applied and wound care instructions given   Dressing type: bandage and petrolatum    Specimen 1 - Surgical pathology Differential Diagnosis: D48.5 r/o SCC vs ISK vs other - ED&C today  Check Margins: No 1.3 cm hyperkeratotic papule  Inflamed seborrheic keratosis Chest  Destruction of lesion - Chest Complexity: simple   Destruction method: cryotherapy   Informed consent: discussed and consent obtained   Timeout:  patient name, date of birth, surgical site, and procedure verified Lesion destroyed using liquid nitrogen: Yes   Region frozen until ice ball extended beyond lesion: Yes   Outcome: patient tolerated procedure well with no complications   Post-procedure details: wound care instructions given     Actinic Damage - chronic, secondary to cumulative UV radiation exposure/sun exposure over time - diffuse scaly erythematous macules with underlying dyspigmentation - Recommend daily broad spectrum sunscreen SPF 30+ to sun-exposed areas, reapply every 2 hours as needed.  - Call for new  or changing lesions.  Seborrheic Keratoses - Stuck-on, waxy, tan-brown papules and plaques  - Discussed benign etiology and prognosis. - Observe - Call for any changes  Return for appointment as scheduled  - [facility] .  Luther Redo, CMA, am acting as scribe for Sarina Ser, MD .  Documentation: I have reviewed the above documentation for accuracy and completeness, and I agree with the above.  Sarina Ser, MD

## 2020-02-15 NOTE — Patient Instructions (Signed)

## 2020-02-21 ENCOUNTER — Ambulatory Visit (INDEPENDENT_AMBULATORY_CARE_PROVIDER_SITE_OTHER): Payer: Medicare Other

## 2020-02-21 VITALS — Ht 63.0 in | Wt 106.0 lb

## 2020-02-21 DIAGNOSIS — Z0000 Encounter for general adult medical examination without abnormal findings: Secondary | ICD-10-CM

## 2020-02-21 NOTE — Progress Notes (Addendum)
Subjective:   Chloe Perkins is a 75 y.o. female who presents for Medicare Annual (Subsequent) preventive examination.  Review of Systems    No ROS.  Medicare Wellness Virtual Visit.   Cardiac Risk Factors include: advanced age (>39men, >76 women)     Objective:    There were no vitals filed for this visit. There is no height or weight on file to calculate BMI.  Advanced Directives 02/21/2020 10/03/2018 03/23/2016  Does Patient Have a Medical Advance Directive? Yes No No  Type of Paramedic of [place];Living will - -  Does patient want to make changes to medical advance directive? No - Patient declined - Yes (MAU/Ambulatory/Procedural Areas - Information given)  Copy of Rafael Capo in Chart? No - copy requested - -  Would patient like information on creating a medical advance directive? - Yes (ED - Information included in AVS) No - Patient declined    Current Medications (verified) Outpatient Encounter Medications as of 02/21/2020  Medication Sig   acetaminophen-codeine (TYLENOL #3) 300-30 MG tablet Take 1 tablet by mouth every 6 (six) hours as needed for moderate pain.   amLODipine (NORVASC) 5 MG tablet Take 1 tablet (5 mg total) by mouth daily.   buPROPion (WELLBUTRIN XL) 150 MG 24 hr tablet Take 450 mg by mouth daily.   calcium-vitamin D (OSCAL WITH D) 500-200 MG-UNIT per tablet Take 1 tablet by mouth 2 (two) times daily.   Cholecalciferol (VITAMIN D-3) 125 MCG (5000 UT) TABS Take 1 tablet by mouth daily.   cyanocobalamin (,VITAMIN B-12,) 1000 MCG/ML injection Inject 1,000 mcg into the muscle every 30 (thirty) days.   LORazepam (ATIVAN) 0.5 MG tablet Take 0.5 mg by mouth See admin instructions. TAKE 1 TABLET (0.5 MG) SCHEDULED IN THE MORNING & 1 TABLET (0.5 MG) IN THE EVENING AS NEEDED FOR ANXIETY.   losartan (COZAAR) 100 MG tablet Take 1 tablet (100 mg total) by mouth daily.   ondansetron (ZOFRAN ODT) 4 MG disintegrating tablet Take 1  tablet (4 mg total) by mouth every 8 (eight) hours as needed for nausea or vomiting.   No facility-administered encounter medications on file as of 02/21/2020.    Allergies (verified) Codeine, Macrobid [nitrofurantoin], Metoprolol, Adhesive [tape], Alendronate sodium, and Verapamil   History: Past Medical History:  Diagnosis Date   Anxiety and depression    Arthritis    Asthmatic bronchitis    Atypical nevus 01/27/2011   right side abdomen-severe atypia   Atypical nevus 11/09/2012   moderate atypia-left upper back   Atypical nevus 11/19/2012   moderate atypia-left forearm   BCC (basal cell carcinoma) 10/23/2002   left bridge nose-Bcc with sclerosis   Bronchitis    history of   Cancer ([place])    MELANOMA   Depression    Dysrhythmia    Heart murmur    Hemorrhoids    History of melanoma    HLD (hyperlipidemia)    Hypertension    Melanoma ([place]) 10/18/1997   right post sholder-melanoma level 1   Mitral valve prolapse    Osteoporosis    Personal history of malignant melanoma of skin    Superficial basal cell carcinoma (BCC) 01/10/2013   right cheek   SVT (supraventricular tachycardia) (HCC)    Viral gastroenteritis    Past Surgical History:  Procedure Laterality Date   ABDOMINAL HYSTERECTOMY  1975   CATARACT EXTRACTION W/PHACO Right 02/10/2017   Procedure: CATARACT EXTRACTION PHACO AND INTRAOCULAR LENS PLACEMENT ([place]);  Surgeon: George Ina,  Gwyndolyn Saxon, MD;  Location: [facility] ORS;  Service: Ophthalmology;  Laterality: Right;  [place] 00:35.9 AP% 11.5 CDE 4.11 Fluid Pack Lot # [id number]   CATARACT EXTRACTION W/PHACO Left 03/09/2017   Procedure: CATARACT EXTRACTION PHACO AND INTRAOCULAR LENS PLACEMENT (IOC);  Surgeon: Birder Robson, MD;  Location: [facility] ORS;  Service: Ophthalmology;  Laterality: Left;  [place] 00:23 AP% 16.3 CDE 3.85 Fluid pack lot # [id number]   COLONOSCOPY  04/15/2004   2006: Normal   ENDOMETRIAL ABLATION  1976   left thumb tendon repair  10/09   MELANOMA EXCISION  1999    superficial spreading melanoma right post shoulder   NM MYOCAR PERF WALL MOTION  12/28/2008   No ischemia   SKIN CANCER EXCISION  03/02/2013   superficial basal cell cancer   US ECHOCARDIOGRAPHY  10/16/2010   mitral valve leaflets midly thickened,trace MR.   WRIST SURGERY Left 2011   Family History  Problem Relation Age of Onset   Emphysema Father        PGF, brother, sister   COPD Father        PGF, brother, sister   Heart disease Father    Rheum arthritis Mother    Stroke Mother    Heart disease Mother        Father, brother, sister   Lung cancer Paternal Grandfather    Ovarian cancer Paternal Aunt    Cancer Brother    Heart disease Brother    Heart disease Sister    Breast cancer Neg Hx    Social History   Socioeconomic History   Marital status: Married    Spouse name: sammy Mckeever   Number of children: 1   Years of education: Not on file   Highest education level: Not on file  Occupational History   Occupation: Retired  Tobacco Use   Smoking status: Former Smoker    Packs/day: 0.50    Years: 20.00    Pack years: 10.00    Types: Cigarettes    Quit date: 02/09/1990    Years since quitting: 30.0   Smokeless tobacco: Never Used  Vaping Use   Vaping Use: Never used  Substance and Sexual Activity   Alcohol use: No    Alcohol/week: 0.0 standard drinks   Drug use: No   Sexual activity: Yes  Other Topics Concern   Not on file  Social History Narrative   She lives her family, she has one child, retired, no smoke no drink   Social Determinants of Radio broadcast assistant Strain: Low Risk    Difficulty of Paying Living Expenses: Not hard at all  Food Insecurity: No Food Insecurity   Worried About Charity fundraiser in the Last Year: Never true   Arboriculturist in the Last Year: Never true  Transportation Needs: No Transportation Needs   Lack of Transportation (Medical): No   Lack of Transportation (Non-Medical): No  Physical Activity: Not on file  Stress:  No Stress Concern Present   Feeling of Stress : Not at all  Social Connections: Unknown   Frequency of Communication with Friends and Family: Not on file   Frequency of Social Gatherings with Friends and Family: Not on file   Attends Religious Services: Not on file   Active Member of Clubs or Organizations: Not on file   Attends Archivist Meetings: Not on file   Marital Status: Married    Tobacco Counseling Counseling given: Not Answered   Clinical Intake:  Pre-visit  preparation completed: Yes        Diabetes: No  How often do you need to have someone help you when you read instructions, pamphlets, or other written materials from your doctor or pharmacy?: 1 - Never    Interpreter Needed?: No     Activities of Daily Living In your present state of health, do you have any difficulty performing the following activities: 02/21/2020  Hearing? N  Vision? N  Difficulty concentrating or making decisions? N  Walking or climbing stairs? Y  Comment Paces self when walking distance and climbing stairs  Dressing or bathing? N  Doing errands, shopping? N  Preparing Food and eating ? N  Using the Toilet? N  In the past six months, have you accidently leaked urine? N  Do you have problems with loss of bowel control? N  Managing your Medications? N  Managing your Finances? N  Housekeeping or managing your Housekeeping? N  Comment Husband assist as needed  Some recent data might be hidden    Patient Care Team: [name], MD as PCP - General (Internal Medicine) Troy Sine, MD as PCP - Cardiology (Cardiology) Gatha Mayer, MD as Consulting Physician (Gastroenterology) Beverly Gust, MD (Unknown Physician Specialty)  Indicate any recent Medical Services you may have received from other than [facility] providers in the past year (date may be approximate).     Assessment:   This is a routine wellness examination for [place].  I connected with Fatina  today by telephone and verified that I am speaking with the correct person using two identifiers. Location patient: home Location provider: work Persons participating in the virtual visit: patient, Marine scientist.    I discussed the limitations, risks, security and privacy concerns of performing an evaluation and management service by telephone and the availability of in person appointments. The patient expressed understanding and verbally consented to this telephonic visit.    Interactive audio and video telecommunications were attempted between this provider and patient, however failed, due to patient having technical difficulties OR patient did not have access to video capability.  We continued and completed visit with audio only.  Some vital signs may be absent or patient reported.   Hearing/Vision screen  Hearing Screening   125Hz  250Hz  500Hz  1000Hz  2000Hz  3000Hz  4000Hz  6000Hz  8000Hz   Right ear:           Left ear:           Comments: Patient is able to hear conversational tones without difficulty.  No issues reported.    Vision Screening Comments: Followed [facility] Wears corrective lenses Bilateral cataracts, extracted Visual acuity not assessed, virtual visit.  They have seen their ophthalmologist in the last 12 months.   Dietary issues and exercise activities discussed: Current Exercise Habits: The patient does not participate in regular exercise at present  Regular diet Good water intake  Goals      Increase physical activity     Walk 3 days a week, 30 minutes, moderate pace       Depression Screen PHQ 2/9 Scores 02/21/2020 03/23/2016 03/18/2015  PHQ - 2 Score 0 0 0    Fall Risk Fall Risk  02/21/2020 02/24/2019 12/24/2017 10/27/2016 03/23/2016  Falls in the past year? 0 0 0 No No  Comment - - Emmi Telephone Survey: data to providers prior to load - -  Number falls in past yr: 0 - - - -  Injury with Fall? 0 - - - -  Follow up Falls evaluation completed Falls  evaluation completed - - -    FALL RISK PREVENTION PERTAINING TO THE HOME: Handrails in use when climbing stairs? Yes Home free of loose throw rugs in walkways, pet beds, electrical cords, etc? Yes  Adequate lighting in your home to reduce risk of falls? Yes   ASSISTIVE DEVICES UTILIZED TO PREVENT FALLS: Life alert? No  Use of a cane, walker or w/c? No  Grab bars in the bathroom? Yes  Shower chair or bench in shower? No   Elevated toilet seat or a handicapped toilet? No   TIMED UP AND GO: Was the test performed? No . Virtual visit.   Cognitive Function: Patient is alert.  Declines MMSE/6CIT.  MMSE - Mini Mental State Exam 03/23/2016  Orientation to time 5  Orientation to Place 5  Registration 3  Attention/ Calculation 5  Recall 3  Language- name 2 objects 2  Language- repeat 1  Language- follow 3 step command 3  Language- read & follow direction 1  Write a sentence 1  Copy design 1  Total score 30       Immunizations Immunization History  Administered Date(s) Administered   Fluad Quad(high Dose 65+) 11/24/2018   Influenza Split 11/27/2010, 11/05/2011, 12/09/2012, 11/09/2013   Influenza Whole 02/09/2009   Influenza, High Dose Seasonal PF 11/11/2015, 12/27/2017   Influenza-Unspecified 12/08/2014   PFIZER SARS-COV-2 Vaccination 03/13/2019, 04/03/2019   Pneumococcal Conjugate-13 03/18/2015   Pneumococcal Polysaccharide-23 12/10/2011   Tdap 07/09/2015   Health Maintenance Health Maintenance  Topic Date Due   Hepatitis C Screening  Never done   COLONOSCOPY (Pts 45-16yrs Insurance coverage will need to be confirmed)  04/16/2014   COVID-19 Vaccine (3 - Pfizer risk 4-dose series) 05/01/2019   INFLUENZA VACCINE  05/09/2020 (Originally 09/10/2019)   MAMMOGRAM  05/15/2020   TETANUS/TDAP  07/08/2025   DEXA SCAN  Completed   PNA vac Low Risk Adult  Completed   Mammogram status: Completed 05/16/19. Repeat every year.MM 3D SCREEN BREAST BILATERAL.  Bone Density status:  Completed 04/01/15. Results reflect: Bone density results: OSTEOPOROSIS. Repeat every 2 years. calcium-vitamin D (OSCAL WITH D) 500-200 MG-UNIT per tablet. Cholecalciferol (VITAMIN D-3) 125 MCG (5000 UT) TABS.  Lung Cancer Screening: 11/09/16.  Hepatitis C Screening: does qualify; consent given.   Vision Screening: Recommended annual ophthalmology exams for early detection of glaucoma and other disorders of the eye. Is the patient up to date with their annual eye exam?  Yes  Who is the provider or what is the name of the office in which the patient attends annual eye exams? [facility].  Dental Screening: Recommended annual dental exams for proper oral hygiene.   Community Resource Referral / Chronic Care Management: CRR required this visit?  No   CCM required this visit?  No      Plan:   Keep all routine maintenance appointments.   Follow up 03/21/19 @ 3:30.  I have personally reviewed and noted the following in the patient's chart:   Medical and social history Use of alcohol, tobacco or illicit drugs  Current medications and supplements Functional ability and status Nutritional status Physical activity Advanced directives List of other physicians Hospitalizations, surgeries, and ER visits in previous 12 months Vitals Screenings to include cognitive, depression, and falls Referrals and appointments  In addition, I have reviewed and discussed with patient certain preventive protocols, quality metrics, and best practice recommendations. A written personalized care plan for preventive services as well as general preventive health  recommendations were provided to patient via mail.     OBrien-Blaney, Hamad Whyte L, LPN   08/05/9483    I have reviewed the above information and agree with above.   Deborra Medina, MD

## 2020-02-21 NOTE — Patient Instructions (Addendum)
Chloe Perkins , Thank you for taking time to come for your Medicare Wellness Visit. I appreciate your ongoing commitment to your health goals. Please review the following plan we discussed and let me know if I can assist you in the future.   These are the goals we discussed: Goals    . Increase physical activity     Walk 3 days a week, 30 minutes, moderate pace       This is a list of the screening recommended for you and due dates:  Health Maintenance  Topic Date Due  .  Hepatitis C: One time screening is recommended by Center for Disease Control  (CDC) for  adults born from [birth year] through 1966.   Never done  . Colon Cancer Screening  04/16/2014  . COVID-19 Vaccine (3 - Pfizer risk 4-dose series) 05/01/2019  . Flu Shot  05/09/2020*  . Mammogram  05/15/2020  . Tetanus Vaccine  07/08/2025  . DEXA scan (bone density measurement)  Completed  . Pneumonia vaccines  Completed  *Topic was postponed. The date shown is not the original due date.    Immunizations Immunization History  Administered Date(s) Administered  . Fluad Quad(high Dose 65+) 11/24/2018  . Influenza Split 11/27/2010, 11/05/2011, 12/09/2012, 11/09/2013  . Influenza Whole 02/09/2009  . Influenza, High Dose Seasonal PF 11/11/2015, 12/27/2017  . Influenza-Unspecified 12/08/2014  . PFIZER SARS-COV-2 Vaccination 03/13/2019, 04/03/2019  . Pneumococcal Conjugate-13 03/18/2015  . Pneumococcal Polysaccharide-23 12/10/2011  . Tdap 07/09/2015   Keep all routine maintenance appointments.   Follow up 03/21/19 @ 3:30.  Advanced directives: End of life planning; Advance aging; Advanced directives discussed.  Copy of current HCPOA/Living Will requested.    Conditions/risks identified: none new.  Follow up in one year for your annual wellness visit.   Preventive Care 76 Years and Older, Female Preventive care refers to lifestyle choices and visits with your health care provider that can promote health and wellness. What does  preventive care include?  A yearly physical exam. This is also called an annual well check.  Dental exams once or twice a year.  Routine eye exams. Ask your health care provider how often you should have your eyes checked.  Personal lifestyle choices, including:  Daily care of your teeth and gums.  Regular physical activity.  Eating a healthy diet.  Avoiding tobacco and drug use.  Limiting alcohol use.  Practicing safe sex.  Taking low-dose aspirin every day.  Taking vitamin and mineral supplements as recommended by your health care provider. What happens during an annual well check? The services and screenings done by your health care provider during your annual well check will depend on your age, overall health, lifestyle risk factors, and family history of disease. Counseling  Your health care provider may ask you questions about your:  Alcohol use.  Tobacco use.  Drug use.  Emotional well-being.  Home and relationship well-being.  Sexual activity.  Eating habits.  History of falls.  Memory and ability to understand (cognition).  Work and work Statistician.  Reproductive health. Screening  You may have the following tests or measurements:  Height, weight, and BMI.  Blood pressure.  Lipid and cholesterol levels. These may be checked every 5 years, or more frequently if you are over 76 years old.  Skin check.  Lung cancer screening. You may have this screening every year starting at age 20 if you have a 30-pack-year history of smoking and currently smoke or have quit within the past 15 years.  Fecal occult blood test (FOBT) of the stool. You may have this test every year starting at age 76.  Flexible sigmoidoscopy or colonoscopy. You may have a sigmoidoscopy every 5 years or a colonoscopy every 10 years starting at age 76.  Hepatitis C blood test.  Hepatitis B blood test.  Sexually transmitted disease (STD) testing.  Diabetes screening. This  is done by checking your blood sugar (glucose) after you have not eaten for a while (fasting). You may have this done every 1-3 years.  Bone density scan. This is done to screen for osteoporosis. You may have this done starting at age 76.  Mammogram. This may be done every 1-2 years. Talk to your health care provider about how often you should have regular mammograms. Talk with your health care provider about your test results, treatment options, and if necessary, the need for more tests. Vaccines  Your health care provider may recommend certain vaccines, such as:  Influenza vaccine. This is recommended every year.  Tetanus, diphtheria, and acellular pertussis (Tdap, Td) vaccine. You may need a Td booster every 10 years.  Zoster vaccine. You may need this after age 76.  Pneumococcal 13-valent conjugate (PCV13) vaccine. One dose is recommended after age 76.  Pneumococcal polysaccharide (PPSV23) vaccine. One dose is recommended after age 76. Talk to your health care provider about which screenings and vaccines you need and how often you need them. This information is not intended to replace advice given to you by your health care provider. Make sure you discuss any questions you have with your health care provider. Document Released: 02/22/2015 Document Revised: 10/16/2015 Document Reviewed: 11/27/2014 Elsevier Interactive Patient Education  2017 [place] Prevention in the Home Falls can cause injuries. They can happen to people of all ages. There are many things you can do to make your home safe and to help prevent falls. What can I do on the outside of my home?  Regularly fix the edges of walkways and driveways and fix any cracks.  Remove anything that might make you trip as you walk through a door, such as a raised step or threshold.  Trim any bushes or trees on the path to your home.  Use bright outdoor lighting.  Clear any walking paths of anything that might make  someone trip, such as rocks or tools.  Regularly check to see if handrails are loose or broken. Make sure that both sides of any steps have handrails.  Any raised decks and porches should have guardrails on the edges.  Have any leaves, snow, or ice cleared regularly.  Use sand or salt on walking paths during winter.  Clean up any spills in your garage right away. This includes oil or grease spills. What can I do in the bathroom?  Use night lights.  Install grab bars by the toilet and in the tub and shower. Do not use towel bars as grab bars.  Use non-skid mats or decals in the tub or shower.  If you need to sit down in the shower, use a plastic, non-slip stool.  Keep the floor dry. Clean up any water that spills on the floor as soon as it happens.  Remove soap buildup in the tub or shower regularly.  Attach bath mats securely with double-sided non-slip rug tape.  Do not have throw rugs and other things on the floor that can make you trip. What can I do in the bedroom?  Use night lights.  Make sure that  you have a light by your bed that is easy to reach.  Do not use any sheets or blankets that are too big for your bed. They should not hang down onto the floor.  Have a firm chair that has side arms. You can use this for support while you get dressed.  Do not have throw rugs and other things on the floor that can make you trip. What can I do in the kitchen?  Clean up any spills right away.  Avoid walking on wet floors.  Keep items that you use a lot in easy-to-reach places.  If you need to reach something above you, use a strong step stool that has a grab bar.  Keep electrical cords out of the way.  Do not use floor polish or wax that makes floors slippery. If you must use wax, use non-skid floor wax.  Do not have throw rugs and other things on the floor that can make you trip. What can I do with my stairs?  Do not leave any items on the stairs.  Make sure that  there are handrails on both sides of the stairs and use them. Fix handrails that are broken or loose. Make sure that handrails are as long as the stairways.  Check any carpeting to make sure that it is firmly attached to the stairs. Fix any carpet that is loose or worn.  Avoid having throw rugs at the top or bottom of the stairs. If you do have throw rugs, attach them to the floor with carpet tape.  Make sure that you have a light switch at the top of the stairs and the bottom of the stairs. If you do not have them, ask someone to add them for you. What else can I do to help prevent falls?  Wear shoes that:  Do not have high heels.  Have rubber bottoms.  Are comfortable and fit you well.  Are closed at the toe. Do not wear sandals.  If you use a stepladder:  Make sure that it is fully opened. Do not climb a closed stepladder.  Make sure that both sides of the stepladder are locked into place.  Ask someone to hold it for you, if possible.  Clearly mark and make sure that you can see:  Any grab bars or handrails.  First and last steps.  Where the edge of each step is.  Use tools that help you move around (mobility aids) if they are needed. These include:  Canes.  Walkers.  Scooters.  Crutches.  Turn on the lights when you go into a dark area. Replace any light bulbs as soon as they burn out.  Set up your furniture so you have a clear path. Avoid moving your furniture around.  If any of your floors are uneven, fix them.  If there are any pets around you, be aware of where they are.  Review your medicines with your doctor. Some medicines can make you feel dizzy. This can increase your chance of falling. Ask your doctor what other things that you can do to help prevent falls. This information is not intended to replace advice given to you by your health care provider. Make sure you discuss any questions you have with your health care provider. Document Released:  11/22/2008 Document Revised: 07/04/2015 Document Reviewed: 03/02/2014 Elsevier Interactive Patient Education  2017 [employer].

## 2020-02-22 ENCOUNTER — Other Ambulatory Visit: Payer: Self-pay | Admitting: Cardiovascular Disease

## 2020-02-27 ENCOUNTER — Encounter: Payer: Self-pay | Admitting: Dermatology

## 2020-02-27 ENCOUNTER — Ambulatory Visit: Payer: Medicare Other

## 2020-02-27 ENCOUNTER — Telehealth: Payer: Self-pay

## 2020-02-27 NOTE — Telephone Encounter (Signed)
-----   Message from Ralene Bathe, MD sent at 02/24/2020  2:15 PM EST ----- Diagnosis Skin , left upper back paraspinal VERRUCA VULGARIS, IRRITATED  Benign viral wart Already treated No further treatment needed at this time May recur - observe

## 2020-02-27 NOTE — Telephone Encounter (Signed)
Patient informed of pathology results 

## 2020-02-29 ENCOUNTER — Ambulatory Visit (INDEPENDENT_AMBULATORY_CARE_PROVIDER_SITE_OTHER): Payer: Medicare Other

## 2020-02-29 ENCOUNTER — Other Ambulatory Visit: Payer: Self-pay

## 2020-02-29 DIAGNOSIS — E538 Deficiency of other specified B group vitamins: Secondary | ICD-10-CM | POA: Diagnosis not present

## 2020-02-29 MED ORDER — CYANOCOBALAMIN 1000 MCG/ML IJ SOLN
1000.0000 ug | Freq: Once | INTRAMUSCULAR | Status: AC
  Administered 2020-02-29: 1000 ug via INTRAMUSCULAR

## 2020-02-29 NOTE — Progress Notes (Signed)
Patient presented for B 12 injection to right deltoid, patient voiced no concerns nor showed any signs of distress during injection. 

## 2020-03-20 ENCOUNTER — Encounter: Payer: Self-pay | Admitting: Internal Medicine

## 2020-03-20 ENCOUNTER — Other Ambulatory Visit: Payer: Self-pay

## 2020-03-20 ENCOUNTER — Ambulatory Visit (INDEPENDENT_AMBULATORY_CARE_PROVIDER_SITE_OTHER): Payer: Medicare Other | Admitting: Internal Medicine

## 2020-03-20 VITALS — BP 124/72 | HR 88 | Temp 99.2°F | Ht 62.99 in | Wt 109.6 lb

## 2020-03-20 DIAGNOSIS — I10 Essential (primary) hypertension: Secondary | ICD-10-CM

## 2020-03-20 DIAGNOSIS — N1831 Chronic kidney disease, stage 3a: Secondary | ICD-10-CM

## 2020-03-20 DIAGNOSIS — Z1231 Encounter for screening mammogram for malignant neoplasm of breast: Secondary | ICD-10-CM | POA: Diagnosis not present

## 2020-03-20 DIAGNOSIS — E782 Mixed hyperlipidemia: Secondary | ICD-10-CM

## 2020-03-20 DIAGNOSIS — I471 Supraventricular tachycardia: Secondary | ICD-10-CM | POA: Diagnosis not present

## 2020-03-20 DIAGNOSIS — F419 Anxiety disorder, unspecified: Secondary | ICD-10-CM | POA: Diagnosis not present

## 2020-03-20 DIAGNOSIS — E038 Other specified hypothyroidism: Secondary | ICD-10-CM

## 2020-03-20 DIAGNOSIS — E559 Vitamin D deficiency, unspecified: Secondary | ICD-10-CM

## 2020-03-20 DIAGNOSIS — Z5320 Procedure and treatment not carried out because of patient's decision for unspecified reasons: Secondary | ICD-10-CM

## 2020-03-20 DIAGNOSIS — I2510 Atherosclerotic heart disease of native coronary artery without angina pectoris: Secondary | ICD-10-CM

## 2020-03-20 DIAGNOSIS — M810 Age-related osteoporosis without current pathological fracture: Secondary | ICD-10-CM

## 2020-03-20 DIAGNOSIS — F32A Depression, unspecified: Secondary | ICD-10-CM

## 2020-03-20 DIAGNOSIS — E538 Deficiency of other specified B group vitamins: Secondary | ICD-10-CM

## 2020-03-20 MED ORDER — ZOSTER VAC RECOMB ADJUVANTED 50 MCG/0.5ML IM SUSR
0.5000 mL | Freq: Once | INTRAMUSCULAR | 1 refills | Status: AC
Start: 2020-03-20 — End: 2020-03-20

## 2020-03-20 NOTE — Progress Notes (Signed)
Subjective:  Patient ID: Chloe Perkins, female    DOB: 05/17/1945  Age: 75 y.o. MRN: [id number]  CC: The primary encounter diagnosis was Essential hypertension. Diagnoses of Breast cancer screening by mammogram, SVT (supraventricular tachycardia) ([place]), Anxiety and depression, B12 deficiency, Subclinical hypothyroidism, Moderate mixed hyperlipidemia not requiring statin therapy, Age-related osteoporosis without current pathological fracture, Stage 3a chronic kidney disease ([place]), Coronary artery disease involving native coronary artery of native heart without angina pectoris, and Colon cancer screening declined were also pertinent to this visit.  HPI Chloe Perkins presents for  Follow up on chronic conditions including SVT,  Coronary atherosclerosis, hypertension, depression/anxiety  and underweight .  Hypertension: patient checks blood pressure twice weekly at home.  Readings have been for the most part < 140/80 at rest . Patient is following a reduce salt diet most days and is taking medications as prescribed (amloidipine and losartan) she denies any recurrence of palpitations despite change in medication.   Depression/anxiety:  The patient feels generally well,  And  denies any feelings of hopelessness or anhedonia.  No panic attacks.  Patient denies any active or passive suicidal thoughts or psychosis.  Appetite is good.  Denies any marital conflict or other stressors contributing to anxiety .   Coronary atherosclerosis:  She leads a sedentary life secondary to chronic back pain and denies any recent or past history of chest pain.  She has a past intolerance to statins, and has declined ezitimibe recommended  By her cardiologist   Outpatient Medications Prior to Visit  Medication Sig Dispense Refill  . amLODipine (NORVASC) 5 MG tablet TAKE 1 TABLET(5 MG) BY MOUTH DAILY 90 tablet 3  . buPROPion (WELLBUTRIN XL) 150 MG 24 hr tablet Take 450 mg by mouth daily.    . calcium-vitamin D  (OSCAL WITH D) 500-200 MG-UNIT per tablet Take 1 tablet by mouth 2 (two) times daily.    . cyanocobalamin (,VITAMIN B-12,) 1000 MCG/ML injection Inject 1,000 mcg into the muscle every 30 (thirty) days.    Marland Kitchen LORazepam (ATIVAN) 0.5 MG tablet Take 0.5 mg by mouth See admin instructions. TAKE 1 TABLET (0.5 MG) SCHEDULED IN THE MORNING & 1 TABLET (0.5 MG) IN THE EVENING AS NEEDED FOR ANXIETY.  5  . losartan (COZAAR) 100 MG tablet Take 1 tablet (100 mg total) by mouth daily. 90 tablet 3  . acetaminophen-codeine (TYLENOL #3) 300-30 MG tablet Take 1 tablet by mouth every 6 (six) hours as needed for moderate pain. (Patient not taking: Reported on 03/20/2020) 30 tablet 0  . Cholecalciferol (VITAMIN D-3) 125 MCG (5000 UT) TABS Take 1 tablet by mouth daily. (Patient not taking: Reported on 03/20/2020) 90 tablet 3  . ondansetron (ZOFRAN ODT) 4 MG disintegrating tablet Take 1 tablet (4 mg total) by mouth every 8 (eight) hours as needed for nausea or vomiting. (Patient not taking: Reported on 03/20/2020) 30 tablet 1   No facility-administered medications prior to visit.    Review of Systems;  Patient denies headache, fevers, malaise, unintentional weight loss, skin rash, eye pain, sinus congestion and sinus pain, sore throat, dysphagia,  hemoptysis , cough, dyspnea, wheezing, chest pain, palpitations, orthopnea, edema, abdominal pain, nausea, melena, diarrhea, constipation, flank pain, dysuria, hematuria, urinary  Frequency, nocturia, numbness, tingling, seizures,  Focal weakness, Loss of consciousness,  Tremor, insomnia, depression, anxiety, and suicidal ideation.      Objective:  BP 124/72 (BP Location: Left Arm, Patient Position: Sitting)   Pulse 88   Temp 99.2 F (37.3 C)  Ht 5' 2.99" (1.6 m)   Wt 109 lb 9.6 oz (49.7 kg)   SpO2 96%   BMI 19.42 kg/m   BP Readings from Last 3 Encounters:  03/20/20 124/72  12/05/19 140/74  11/07/19 (!) 150/90    Wt Readings from Last 3 Encounters:  03/20/20 109 lb  9.6 oz (49.7 kg)  02/21/20 106 lb (48.1 kg)  12/05/19 106 lb 12.8 oz (48.4 kg)    General appearance: alert, cooperative and appears stated age Ears: normal TM's and external ear canals both ears Throat: lips, mucosa, and tongue normal; teeth and gums normal Neck: no adenopathy, no carotid bruit, supple, symmetrical, trachea midline and thyroid not enlarged, symmetric, no tenderness/mass/nodules Back: symmetric, no curvature. ROM normal. No CVA tenderness. Lungs: clear to auscultation bilaterally Heart: regular rate and rhythm, S1, S2 normal, no murmur, click, rub or gallop Abdomen: soft, non-tender; bowel sounds normal; no masses,  no organomegaly Pulses: 2+ and symmetric Skin: Skin color, texture, turgor normal. No rashes or lesions Lymph nodes: Cervical, supraclavicular, and axillary nodes normal.  No results found for: HGBA1C  Lab Results  Component Value Date   CREATININE 1.01 (H) 12/05/2019   CREATININE 1.01 12/16/2017   CREATININE 1.07 (H) 05/05/2017    Lab Results  Component Value Date   WBC 4.7 12/05/2019   HGB 13.5 12/05/2019   HCT 40.3 12/05/2019   PLT 181 12/05/2019   GLUCOSE 82 12/05/2019   CHOL 201 (H) 12/05/2019   TRIG 63 12/05/2019   HDL 101 12/05/2019   LDLDIRECT 102.2 04/23/2006   LDLCALC 89 12/05/2019   ALT 13 12/05/2019   AST 24 12/05/2019   NA 141 12/05/2019   K 4.8 12/05/2019   CL 102 12/05/2019   CREATININE 1.01 (H) 12/05/2019   BUN 21 12/05/2019   CO2 25 12/05/2019   TSH 5.600 (H) 12/05/2019   MICROALBUR 0.3 12/13/2013    DG Lumbar Spine 2-3 Views  Result Date: 10/09/2019 CLINICAL DATA:  75 year old female with low back pain. EXAM: LUMBAR SPINE - 2-3 VIEW COMPARISON:  None. FINDINGS: No acute fracture or subluxation. No focal bony lesions are present. Very mild multilevel degenerative disc disease noted. Aortic atherosclerotic calcifications are present. IMPRESSION: 1. No evidence of acute abnormality. 2. Very mild multilevel degenerative  disc disease. 3.  Aortic Atherosclerosis (ICD10-I70.0). Electronically Signed   By: Margarette Canada M.D.   On: 10/09/2019 16:34   MR Lumbar Spine w/o contrast  Result Date: 10/10/2019 CLINICAL DATA:  Hip pain. Lumbar radiculopathy, no red flag; severe pain. Additional history provided by scanning technologist: Patient reports low back pain with pain radiating down both sides of abdomen for 6 days, pain also radiates to front of legs. EXAM: MRI LUMBAR SPINE WITHOUT CONTRAST TECHNIQUE: Multiplanar, multisequence MR imaging of the lumbar spine was performed. No intravenous contrast was administered. COMPARISON:  Lumbar spine radiographs 10/09/2019, lumbar spine MRI 11/04/2014 FINDINGS: Segmentation: For the purposes of this dictation, five lumbar vertebrae are assumed and the caudal most well-formed intervertebral disc is designated L5-S1. Alignment:  Mild grade 1 retrolisthesis at L1-L2, L2-L3 and L3-L4. Vertebrae: There is irregularity of the L2 superior endplate anteriorly with moderate surrounding edema. This has an appearance most suggestive of an acute Schmorl node. Trace degenerative edema is also present anteriorly along the L5 superior endplate. There is nonspecific background diffuse marrow heterogeneity. Additional levels of degenerative endplate irregularity. Conus medullaris and cauda equina: Conus extends to the L1-L2 level. There is mild prominence of the central canal within the  visualized distal spinal cord measuring less than 2 mm in width. Paraspinal and other soft tissues: There are several subcentimeter foci of T2 hyperintensity within the right kidney which may reflect tiny hemorrhagic cysts or possibly renal calculi. Atrophy of the lumbar paraspinal musculature. Disc levels: Unless otherwise stated, the level by level findings below have not significantly changed since prior MRI 11/04/2014. Progressive multilevel disc degeneration. Most notably, there is now mild/moderate disc degeneration at  L1-L2. No more than mild disc degeneration at the remaining levels. T12-L1: No significant disc herniation or stenosis. L1-L2: Minimal disc bulge. New superimposed shallow right subarticular/foraminal disc protrusion. Minimal relative right subarticular narrowing without nerve root impingement. Central canal patent. No significant foraminal stenosis. L2-L3: Trace retrolisthesis. Progressive disc bulge. New from the prior examination, there is a superimposed right subarticular/foraminal disc protrusion. The disc protrusion contributes to mild right subarticular narrowing, contacting the descending right L3 nerve root (series 13, image 17). Central canal patent. No significant foraminal stenosis. L3-L4: Trace retrolisthesis. Progressive disc bulge. New from the prior examination, there is a shallow right center/subarticular disc protrusion. This contributes to mild relative right subarticular narrowing without frank nerve root impingement. Central canal patent. No significant foraminal stenosis. L4-L5: Disc bulge. New superimposed shallow broad-based central disc protrusion. Mild facet arthrosis/ligamentum flavum hypertrophy. Small right-sided posteriorly projecting synovial facet cyst. Minimal bilateral subarticular narrowing is new from the prior examination. No frank nerve root impingement. The central canal is patent. Progressive mild bilateral inferior neural foraminal narrowing. L5-S1: Small disc bulge. Mild facet arthrosis/ligamentum flavum hypertrophy. Minimal right subarticular narrowing without frank nerve root impingement, progressed. Central canal patent. No significant foraminal stenosis. IMPRESSION: Lumbar spondylosis as outlined and having progressed at several levels since the prior MRI of 11/04/2014. Findings are most notably as follows. There is irregularity of the L2 superior endplate anteriorly with moderate surrounding edema. These findings have an appearance most suggestive of an acute Schmorl  node. At L2-L3, a new right subarticular/foraminal disc protrusion contributes to mild right subarticular narrowing with contact upon the descending right L3 nerve root. At L4-L5, there is progressive multifactorial mild bilateral subarticular narrowing without frank nerve root impingement. Progressive mild bilateral inferior neural foraminal narrowing. No more than mild spinal canal narrowing, and no significant neural foraminal narrowing, at the remaining levels. Multiple subcentimeter T2 hypointense foci within the right kidney may reflect small hemorrhagic renal cysts versus renal calculi. Electronically Signed   By: Kellie Simmering DO   On: 10/10/2019 08:54    Assessment & Plan:   Problem List Items Addressed This Visit      Unprioritized   Anxiety and depression    Symptoms are well managed by Dr Casimiro Needle with twice daily lorazepam and wellbutrin      B12 deficiency    Managed with monthly injections since diagnosis in 2017.  Has not had IF ab checked..   Lab Results  Component Value Date   VITAMINB12 186 (L) 03/18/2015         CAD (coronary artery disease), native coronary artery    Incidental finding on CT   Asymptomatic,  Refuses statin therapy.  HDL > LDL   Lab Results  Component Value Date   CHOL 201 (H) 12/05/2019   HDL 101 12/05/2019   LDLCALC 89 12/05/2019   LDLDIRECT 102.2 04/23/2006   TRIG 63 12/05/2019   CHOLHDL 2.0 12/05/2019         CKD (chronic kidney disease) stage 3, GFR 30-59 ml/min (HCC)    Renal function  is at  baseline with avoidance of NSAIDs.  She is on an ARB for control of hypertension  Lab Results  Component Value Date   CREATININE 1.01 (H) 12/05/2019   Lab Results  Component Value Date   MICROALBUR 0.3 12/13/2013            Colon cancer screening    Her last screening was a colonoscopy in 2006.  Will recommend colonoscopy       Essential hypertension - Primary    Well controlled on current regimen of amlodipine and losartan. Renal  function is stable, no changes today.  Lab Results  Component Value Date   CREATININE 1.01 (H) 12/05/2019   Lab Results  Component Value Date   NA 141 12/05/2019   K 4.8 12/05/2019   CL 102 12/05/2019   CO2 25 12/05/2019         Relevant Orders   Comprehensive metabolic panel   HLD (hyperlipidemia)    Untreated due to HDL > LDL    Lab Results  Component Value Date   CHOL 201 (H) 12/05/2019   HDL 101 12/05/2019   LDLCALC 89 12/05/2019   LDLDIRECT 102.2 04/23/2006   TRIG 63 12/05/2019   CHOLHDL 2.0 12/05/2019        Osteoporosis    With no history of fractures,   Alendronate inolerant.  Prolia advised but deferred by patient. continue calcium supplementation and weight bearing exercise. Evista relatively C/I given CAD      Subclinical hypothyroidism    Subclinical..  Intolerant of low dose synthroid on prior trials. Will repeat tsh annually  Lab Results  Component Value Date   TSH 5.600 (H) 12/05/2019         RESOLVED: SVT (supraventricular tachycardia) ([place])    Other Visit Diagnoses    Breast cancer screening by mammogram       Relevant Orders   MM 3D SCREEN BREAST BILATERAL      I have discontinued Chloe Perkins's ondansetron, acetaminophen-codeine, and Vitamin D-3. I am also having her start on Zoster Vaccine Adjuvanted. Additionally, I am having her maintain her calcium-vitamin D, LORazepam, buPROPion, cyanocobalamin, and amLODipine.  Meds ordered this encounter  Medications  . Zoster Vaccine Adjuvanted [facility]) injection    Sig: Inject 0.5 mLs into the muscle once for 1 dose.    Dispense:  1 each    Refill:  1    Medications Discontinued During This Encounter  Medication Reason  . acetaminophen-codeine (TYLENOL #3) 300-30 MG tablet Error  . Cholecalciferol (VITAMIN D-3) 125 MCG (5000 UT) TABS Error  . ondansetron (ZOFRAN ODT) 4 MG disintegrating tablet Error    Follow-up: Return in about 6 months (around 09/17/2020).   [name], MD

## 2020-03-20 NOTE — Patient Instructions (Signed)
Continue your current medications   Your annual mammogram has been ordered.  You are encouraged (required) to call to make your appointment at [facility]   Return for labs in May

## 2020-03-21 ENCOUNTER — Other Ambulatory Visit: Payer: Self-pay | Admitting: Cardiovascular Disease

## 2020-03-23 ENCOUNTER — Encounter: Payer: Self-pay | Admitting: Internal Medicine

## 2020-03-23 ENCOUNTER — Telehealth: Payer: Self-pay | Admitting: Internal Medicine

## 2020-03-23 DIAGNOSIS — Z1211 Encounter for screening for malignant neoplasm of colon: Secondary | ICD-10-CM | POA: Insufficient documentation

## 2020-03-23 NOTE — Assessment & Plan Note (Signed)
Managed with monthly injections since diagnosis in 2017.  Has not had IF ab checked..   Lab Results  Component Value Date   VITAMINB12 186 (L) 03/18/2015

## 2020-03-23 NOTE — Assessment & Plan Note (Signed)
Incidental finding on CT   Asymptomatic,  Refuses statin therapy.  HDL > LDL   Lab Results  Component Value Date   CHOL 201 (H) 12/05/2019   HDL 101 12/05/2019   LDLCALC 89 12/05/2019   LDLDIRECT 102.2 04/23/2006   TRIG 63 12/05/2019   CHOLHDL 2.0 12/05/2019

## 2020-03-23 NOTE — Assessment & Plan Note (Signed)
Her last screening was a colonoscopy in 2006.  Will recommend colonoscopy

## 2020-03-23 NOTE — Telephone Encounter (Signed)
She is overdue for colon cancer screening (last colonoscopy 2006 per chart)  Has she had one since then? If not, would she life referral or would she rather do the cologuard?

## 2020-03-23 NOTE — Assessment & Plan Note (Addendum)
Well controlled on current regimen of amlodipine and losartan. Renal function is stable, no changes today.  Lab Results  Component Value Date   CREATININE 1.01 (H) 12/05/2019   Lab Results  Component Value Date   NA 141 12/05/2019   K 4.8 12/05/2019   CL 102 12/05/2019   CO2 25 12/05/2019

## 2020-03-23 NOTE — Assessment & Plan Note (Addendum)
Subclinical..  Intolerant of low dose synthroid on prior trials. Will repeat tsh annually  Lab Results  Component Value Date   TSH 5.600 (H) 12/05/2019

## 2020-03-23 NOTE — Assessment & Plan Note (Signed)
Renal function is at  baseline with avoidance of NSAIDs.  She is on an ARB for control of hypertension  Lab Results  Component Value Date   CREATININE 1.01 (H) 12/05/2019   Lab Results  Component Value Date   MICROALBUR 0.3 12/13/2013

## 2020-03-23 NOTE — Assessment & Plan Note (Addendum)
Untreated due to HDL > LDL    Lab Results  Component Value Date   CHOL 201 (H) 12/05/2019   HDL 101 12/05/2019   LDLCALC 89 12/05/2019   LDLDIRECT 102.2 04/23/2006   TRIG 63 12/05/2019   CHOLHDL 2.0 12/05/2019

## 2020-03-23 NOTE — Assessment & Plan Note (Signed)
Symptoms are well managed by Dr Casimiro Needle with twice daily lorazepam and wellbutrin

## 2020-03-23 NOTE — Assessment & Plan Note (Signed)
With no history of fractures,   Alendronate inolerant.  Prolia advised but deferred by patient. continue calcium supplementation and weight bearing exercise. Evista relatively C/I given CAD

## 2020-03-29 NOTE — Telephone Encounter (Signed)
LMTCB

## 2020-04-02 ENCOUNTER — Ambulatory Visit: Payer: Medicare Other

## 2020-04-03 NOTE — Telephone Encounter (Signed)
Phone continued to ring and did not go to voicemail.

## 2020-04-09 NOTE — Telephone Encounter (Signed)
Spoke with pt and she stated that she is not able to do the cologuard and she stated that she does not want to do a colonoscopy at this time.

## 2020-04-25 ENCOUNTER — Ambulatory Visit (INDEPENDENT_AMBULATORY_CARE_PROVIDER_SITE_OTHER): Payer: Medicare Other

## 2020-04-25 ENCOUNTER — Other Ambulatory Visit: Payer: Self-pay

## 2020-04-25 DIAGNOSIS — E538 Deficiency of other specified B group vitamins: Secondary | ICD-10-CM | POA: Diagnosis not present

## 2020-04-25 MED ORDER — CYANOCOBALAMIN 1000 MCG/ML IJ SOLN
1000.0000 ug | Freq: Once | INTRAMUSCULAR | Status: AC
  Administered 2020-04-25: 1000 ug via INTRAMUSCULAR

## 2020-04-25 NOTE — Progress Notes (Signed)
Patient presented for B 12 injection to left deltoid, patient voiced no concerns nor showed any signs of distress during injection. 

## 2020-05-28 ENCOUNTER — Ambulatory Visit: Payer: Medicare Other

## 2020-06-12 ENCOUNTER — Other Ambulatory Visit: Payer: Self-pay

## 2020-06-12 ENCOUNTER — Ambulatory Visit (INDEPENDENT_AMBULATORY_CARE_PROVIDER_SITE_OTHER): Payer: Medicare Other | Admitting: Dermatology

## 2020-06-12 ENCOUNTER — Encounter: Payer: Self-pay | Admitting: Dermatology

## 2020-06-12 DIAGNOSIS — Z1283 Encounter for screening for malignant neoplasm of skin: Secondary | ICD-10-CM | POA: Diagnosis not present

## 2020-06-12 DIAGNOSIS — D1800 Hemangioma unspecified site: Secondary | ICD-10-CM

## 2020-06-12 DIAGNOSIS — D229 Melanocytic nevi, unspecified: Secondary | ICD-10-CM

## 2020-06-12 DIAGNOSIS — L570 Actinic keratosis: Secondary | ICD-10-CM | POA: Diagnosis not present

## 2020-06-12 DIAGNOSIS — Z85828 Personal history of other malignant neoplasm of skin: Secondary | ICD-10-CM

## 2020-06-12 DIAGNOSIS — Z86018 Personal history of other benign neoplasm: Secondary | ICD-10-CM | POA: Diagnosis not present

## 2020-06-12 DIAGNOSIS — I2510 Atherosclerotic heart disease of native coronary artery without angina pectoris: Secondary | ICD-10-CM

## 2020-06-12 DIAGNOSIS — L578 Other skin changes due to chronic exposure to nonionizing radiation: Secondary | ICD-10-CM

## 2020-06-12 DIAGNOSIS — Z85820 Personal history of malignant melanoma of skin: Secondary | ICD-10-CM

## 2020-06-12 DIAGNOSIS — L814 Other melanin hyperpigmentation: Secondary | ICD-10-CM

## 2020-06-12 DIAGNOSIS — L821 Other seborrheic keratosis: Secondary | ICD-10-CM

## 2020-06-12 DIAGNOSIS — L820 Inflamed seborrheic keratosis: Secondary | ICD-10-CM

## 2020-06-12 DIAGNOSIS — D692 Other nonthrombocytopenic purpura: Secondary | ICD-10-CM

## 2020-06-12 NOTE — Patient Instructions (Addendum)
Melanoma ABCDEs  Melanoma is the most dangerous type of skin cancer, and is the leading cause of death from skin disease.  You are more likely to develop melanoma if you:  Have light-colored skin, light-colored eyes, or red or blond hair  Spend a lot of time in the sun  Tan regularly, either outdoors or in a tanning bed  Have had blistering sunburns, especially during childhood  Have a close family member who has had a melanoma  Have atypical moles or large birthmarks  Early detection of melanoma is key since treatment is typically straightforward and cure rates are extremely high if we catch it early.   The first sign of melanoma is often a change in a mole or a new dark spot.  The ABCDE system is a way of remembering the signs of melanoma.  A for asymmetry:  The two halves do not match. B for border:  The edges of the growth are irregular. C for color:  A mixture of colors are present instead of an even brown color. D for diameter:  Melanomas are usually (but not always) greater than 56mm - the size of a pencil eraser. E for evolution:  The spot keeps changing in size, shape, and color.  Please check your skin once per month between visits. You can use a small mirror in front and a large mirror behind you to keep an eye on the back side or your body.   If you see any new or changing lesions before your next follow-up, please call to schedule a visit.  Please continue daily skin protection including broad spectrum sunscreen SPF 30+ to sun-exposed areas, reapplying every 2 hours as needed when you're outdoors.   Staying in the shade or wearing long sleeves, sun glasses (UVA+UVB protection) and wide brim hats (4-inch brim around the entire circumference of the hat) are also recommended for sun protection.   If you have any questions or concerns for your doctor, please call our main line at [phone] and press option 4 to reach your doctor's medical assistant. If no one answers,  please leave a voicemail as directed and we will return your call as soon as possible. Messages left after 4 pm will be answered the following business day.   You may also send [place] a message via [place]. We typically respond to MyChart messages within 1-2 business days.  For prescription refills, please ask your pharmacy to contact our office. Our fax number is [phone].  If you have an urgent issue when the clinic is closed that cannot wait until the next business day, you can page your doctor at the number below.    Please note that while we do our best to be available for urgent issues outside of office hours, we are not available 24/7.   If you have an urgent issue and are unable to reach [place], you may choose to seek medical care at your doctor's office, retail clinic, urgent care center, or emergency room.  If you have a medical emergency, please immediately call 911 or go to the emergency department.  Pager Numbers  - Dr. Nehemiah Massed: [phone]  - Dr. Laurence Ferrari: [phone]  - Dr. Nicole Kindred: [phone]  In the event of inclement weather, please call our main line at [phone] for an update on the status of any delays or closures.  Dermatology Medication Tips: Please keep the boxes that topical medications come in in order to help keep track of the instructions about where and how  to use these. Pharmacies typically print the medication instructions only on the boxes and not directly on the medication tubes.   If your medication is too expensive, please contact our office at [phone] option 4 or send [place] a message through [place].   We are unable to tell what your co-pay for medications will be in advance as this is different depending on your insurance coverage. However, we may be able to find a substitute medication at lower cost or fill out paperwork to get insurance to cover a needed medication.   If a prior authorization is required to get your medication covered by your  insurance company, please allow [place] 1-2 business days to complete this process.  Drug prices often vary depending on where the prescription is filled and some pharmacies may offer cheaper prices.  The website www.goodrx.com contains coupons for medications through different pharmacies. The prices here do not account for what the cost may be with help from insurance (it may be cheaper with your insurance), but the website can give you the price if you did not use any insurance.  - You can print the associated coupon and take it with your prescription to the pharmacy.  - You may also stop by our office during regular business hours and pick up a GoodRx coupon card.  - If you need your prescription sent electronically to a different pharmacy, notify our office through [facility] or by phone at [phone] option 4.  Seborrheic Keratosis  What causes seborrheic keratoses? Seborrheic keratoses are harmless, common skin growths that first appear during adult life.  As time goes by, more growths appear.  Some people may develop a large number of them.  Seborrheic keratoses appear on both covered and uncovered body parts.  They are not caused by sunlight.  The tendency to develop seborrheic keratoses can be inherited.  They vary in color from skin-colored to gray, brown, or even black.  They can be either smooth or have a rough, warty surface.   Seborrheic keratoses are superficial and look as if they were stuck on the skin.  Under the microscope this type of keratosis looks like layers upon layers of skin.  That is why at times the top layer may seem to fall off, but the rest of the growth remains and re-grows.    Treatment Seborrheic keratoses do not need to be treated, but can easily be removed in the office.  Seborrheic keratoses often cause symptoms when they rub on clothing or jewelry.  Lesions can be in the way of shaving.  If they become inflamed, they can cause itching, soreness, or  burning.  Removal of a seborrheic keratosis can be accomplished by freezing, burning, or surgery. If any spot bleeds, scabs, or grows rapidly, please return to have it checked, as these can be an indication of a skin cancer.  Cryotherapy  Cryotherapy is the treatment of lesions with the application of a cold substance.  In most cases, liquid nitrogen is used to destroy the lesion(s).  Liquid nitrogen is so cold, -196 Celsius, it feels like it is burning when it is applied.  After treatment with liquid nitrogen, there may be some burning sensation or pain that can last up to 24 hours.  The area may also be swollen and red.  The discomfort can be relieved with ibuprofen (Advil, Motrin), acetaminophen (Extra Strength Tylenol), or similar pain relief medication.  Within 24 to 48 hours, a blister may form.  Occasionally, these  blisters will become filled with blood and become very dark.  This is no cause for concern.  The blister will gradually dry up over a period of several days, eventually separating from the healing skin below in about one to two weeks.  The surrounding skin will become red and the area may become itchy.  This is all part of the normal healing process.  Occasionally, the crusts will last as long as four weeks when certain deeper spots on the skin are treated.  Sometimes a permanent white mark or scar will be left after healing.  You may continue all of your normal activities as long as they do not cause pain in the treated areas.  It is okay to get the area wet.  After therapy or if the blister is still intact - You may treat it like normal skin.  Covering the area with a bandage may offer some comfort and protection from trauma but is not absolutely necessary.  If the blister is uncomfortable - Clean a small needle with rubbing alcohol, then gently make a small hole in the side of the blister to drain the blister fluid.  This often gives immediate relief.  Do not remove the blister  roof, as the blister aids in healing.  In time it will fall off on its own.   Once the blister roof falls off or a sore forms -  . Clean the blister sites with soap and water.  Rinse the area and pat dry.  Do not force off the blister roof or crust. . Apply an antibiotic ointment such as Polysporin or Bacitracin. . A bandage may be applied loosely over the blister until it is healed if desired. . Call our office if you are concerned it may be infected.  Some redness, itching and oozing is part of the normal healing process.  Signs of infection include increasing redness, increasing pain, swelling, heat, or yellow discharge.  Prior to procedure, discussed risks of blister formation, small wound, skin dyspigmentation, or rare scar following cryotherapy.

## 2020-06-12 NOTE — Progress Notes (Signed)
Follow-Up Visit   Subjective  Chloe Perkins is a 75 y.o. female who presents for the following: Annual Exam (Here for skin cancer screening. Full Body. HxMM, 1999, right posterior shoulder. HxBCC at right cheek and left bridge of nose. HxDN right abdomen, left upper back, left forearm. /Several areas of concern today at forearms. ).  The patient presents for Total-Body Skin Exam (TBSE) for skin cancer screening and mole check.  The following portions of the chart were reviewed this encounter and updated as appropriate:  Tobacco  Allergies  Meds  Problems  Med Hx  Surg Hx  Fam Hx     Review of Systems: No other skin or systemic complaints except as noted in HPI or Assessment and Plan.  Objective  Well appearing patient in no apparent distress; mood and affect are within normal limits.  A full examination was performed including scalp, head, eyes, ears, nose, lips, neck, chest, axillae, abdomen, back, buttocks, bilateral upper extremities, bilateral lower extremities, hands, feet, fingers, toes, fingernails, and toenails. All findings within normal limits unless otherwise noted below.  Objective  Right Temple: Erythematous thin papules/macules with gritty scale.   Objective  Right chest x1, right wrist x1, torso x15 (17): Erythematous keratotic or waxy stuck-on papule or plaque.   Assessment & Plan    Lentigines - Scattered tan macules - Due to sun exposure - Benign-appering, observe - Recommend daily broad spectrum sunscreen SPF 30+ to sun-exposed areas, reapply every 2 hours as needed. - Call for any changes  Seborrheic Keratoses - Stuck-on, waxy, tan-brown papules and/or plaques  - Benign-appearing - Discussed benign etiology and prognosis. - Observe - Call for any changes  Melanocytic Nevi - Tan-brown and/or pink-flesh-colored symmetric macules and papules - Benign appearing on exam today - Observation - Call clinic for new or changing moles - Recommend  daily use of broad spectrum spf 30+ sunscreen to sun-exposed areas.   Hemangiomas - Red papules - Discussed benign nature - Observe - Call for any changes  Actinic Damage - Chronic condition, secondary to cumulative UV/sun exposure - diffuse scaly erythematous macules with underlying dyspigmentation - Recommend daily broad spectrum sunscreen SPF 30+ to sun-exposed areas, reapply every 2 hours as needed.  - Staying in the shade or wearing long sleeves, sun glasses (UVA+UVB protection) and wide brim hats (4-inch brim around the entire circumference of the hat) are also recommended for sun protection.  - Call for new or changing lesions.  Skin cancer screening performed today.   History of Basal Cell Carcinoma of the Skin - No evidence of recurrence today left bridge of nose and right cheek. - Recommend regular full body skin exams - Recommend daily broad spectrum sunscreen SPF 30+ to sun-exposed areas, reapply every 2 hours as needed.  - Call if any new or changing lesions are noted between office visits  History of Dysplastic Nevi - No evidence of recurrence today at right abdomen, left upper back, left forearm.  - Recommend regular full body skin exams - Recommend daily broad spectrum sunscreen SPF 30+ to sun-exposed areas, reapply every 2 hours as needed.  - Call if any new or changing lesions are noted between office visits  History of Melanoma - No evidence of recurrence today at right posterior shoulder.  - No lymphadenopathy - Recommend regular full body skin exams - Recommend daily broad spectrum sunscreen SPF 30+ to sun-exposed areas, reapply every 2 hours as needed.  - Call if any new or changing lesions are  noted between office visits  Purpura - Chronic; persistent and recurrent.  Treatable, but not curable. - Violaceous macules and patches at dorsal forearms - Benign - Related to trauma, age, sun damage and/or use of blood thinners, chronic use of topical and/or oral  steroids - Observe - Can use OTC arnica containing moisturizer such as Dermend Bruise Formula if desired - Call for worsening or other concerns  AK (actinic keratosis) Right Temple  Destruction of lesion - Right Temple Complexity: simple   Destruction method: cryotherapy   Informed consent: discussed and consent obtained   Timeout:  patient name, date of birth, surgical site, and procedure verified Lesion destroyed using liquid nitrogen: Yes   Region frozen until ice ball extended beyond lesion: Yes   Outcome: patient tolerated procedure well with no complications   Post-procedure details: wound care instructions given    Inflamed seborrheic keratosis (17) Right chest x1, right wrist x1, torso x15  Destruction of lesion - Right chest x1, right wrist x1, torso x15 Complexity: simple   Destruction method: cryotherapy   Informed consent: discussed and consent obtained   Timeout:  patient name, date of birth, surgical site, and procedure verified Lesion destroyed using liquid nitrogen: Yes   Region frozen until ice ball extended beyond lesion: Yes   Outcome: patient tolerated procedure well with no complications   Post-procedure details: wound care instructions given    Skin cancer screening  Return in about 1 year (around 06/12/2021) for TBSE, HxMM.   I, Emelia Salisbury, CMA, am acting as scribe for Sarina Ser, MD.  Documentation: I have reviewed the above documentation for accuracy and completeness, and I agree with the above.  Sarina Ser, MD

## 2020-06-13 ENCOUNTER — Ambulatory Visit (INDEPENDENT_AMBULATORY_CARE_PROVIDER_SITE_OTHER): Payer: Medicare Other

## 2020-06-13 DIAGNOSIS — E538 Deficiency of other specified B group vitamins: Secondary | ICD-10-CM

## 2020-06-13 MED ORDER — CYANOCOBALAMIN 1000 MCG/ML IJ SOLN
1000.0000 ug | Freq: Once | INTRAMUSCULAR | Status: AC
  Administered 2020-06-13: 1000 ug via INTRAMUSCULAR

## 2020-06-13 NOTE — Progress Notes (Signed)
Patient presented for B 12 injection to left deltoid, patient voiced no concerns nor showed any signs of distress during injection. 

## 2020-06-15 ENCOUNTER — Encounter: Payer: Self-pay | Admitting: Dermatology

## 2020-07-02 ENCOUNTER — Ambulatory Visit
Admission: RE | Admit: 2020-07-02 | Discharge: 2020-07-02 | Disposition: A | Discharge status: Home / Self Care | Payer: Medicare Other | Source: Ambulatory Visit | Attending: Internal Medicine | Admitting: Internal Medicine

## 2020-07-02 ENCOUNTER — Other Ambulatory Visit: Payer: Self-pay

## 2020-07-02 DIAGNOSIS — Z1231 Encounter for screening mammogram for malignant neoplasm of breast: Secondary | ICD-10-CM | POA: Diagnosis present

## 2020-07-03 ENCOUNTER — Other Ambulatory Visit: Payer: Medicare Other

## 2020-07-16 ENCOUNTER — Ambulatory Visit: Payer: Medicare Other

## 2020-07-17 ENCOUNTER — Other Ambulatory Visit: Payer: Self-pay

## 2020-07-17 ENCOUNTER — Ambulatory Visit (INDEPENDENT_AMBULATORY_CARE_PROVIDER_SITE_OTHER): Payer: Medicare Other | Admitting: Obstetrics and Gynecology

## 2020-07-17 ENCOUNTER — Encounter: Payer: Self-pay | Admitting: Obstetrics and Gynecology

## 2020-07-17 VITALS — BP 120/70 | Ht 64.0 in | Wt 110.2 lb

## 2020-07-17 DIAGNOSIS — R300 Dysuria: Secondary | ICD-10-CM | POA: Diagnosis not present

## 2020-07-17 DIAGNOSIS — N3001 Acute cystitis with hematuria: Secondary | ICD-10-CM | POA: Diagnosis not present

## 2020-07-17 LAB — POCT URINALYSIS DIPSTICK
Bilirubin, UA: NEGATIVE
Blood, UA: POSITIVE
Glucose, UA: NEGATIVE
Nitrite, UA: NEGATIVE
Protein, UA: NEGATIVE
Spec Grav, UA: 1.025 (ref 1.010–1.025)
Urobilinogen, UA: 0.2 E.U./dL
pH, UA: 5 (ref 5.0–8.0)

## 2020-07-17 MED ORDER — SULFAMETHOXAZOLE-TRIMETHOPRIM 800-160 MG PO TABS: 1.0000 | ORAL_TABLET | Freq: Two times a day (BID) | ORAL | 0 refills | Status: AC

## 2020-07-17 NOTE — Progress Notes (Signed)
poc

## 2020-07-17 NOTE — Patient Instructions (Signed)
Urinary Tract Infection, Adult  A urinary tract infection (UTI) is an infection of any part of the urinary tract. The urinary tract includes the kidneys, ureters, bladder, and urethra. These organs make, store, and get rid of urine in the body. An upper UTI affects the ureters and kidneys. A lower UTI affects the bladder and urethra. What are the causes? Most urinary tract infections are caused by bacteria in your genital area around your urethra, where urine leaves your body. These bacteria grow and cause inflammation of your urinary tract. What increases the risk? You are more likely to develop this condition if:  You have a urinary catheter that stays in place.  You are not able to control when you urinate or have a bowel movement (incontinence).  You are female and you: ? Use a spermicide or diaphragm for birth control. ? Have low estrogen levels. ? Are pregnant.  You have certain genes that increase your risk.  You are sexually active.  You take antibiotic medicines.  You have a condition that causes your flow of urine to slow down, such as: ? An enlarged prostate, if you are female. ? Blockage in your urethra. ? A kidney stone. ? A nerve condition that affects your bladder control (neurogenic bladder). ? Not getting enough to drink, or not urinating often.  You have certain medical conditions, such as: ? Diabetes. ? A weak disease-fighting system (immunesystem). ? Sickle cell disease. ? Gout. ? Spinal cord injury. What are the signs or symptoms? Symptoms of this condition include:  Needing to urinate right away (urgency).  Frequent urination. This may include small amounts of urine each time you urinate.  Pain or burning with urination.  Blood in the urine.  Urine that smells bad or unusual.  Trouble urinating.  Cloudy urine.  Vaginal discharge, if you are female.  Pain in the abdomen or the lower back. You may also have:  Vomiting or a decreased  appetite.  Confusion.  Irritability or tiredness.  A fever or chills.  Diarrhea. The first symptom in older adults may be confusion. In some cases, they may not have any symptoms until the infection has worsened. How is this diagnosed? This condition is diagnosed based on your medical history and a physical exam. You may also have other tests, including:  Urine tests.  Blood tests.  Tests for STIs (sexually transmitted infections). If you have had more than one UTI, a cystoscopy or imaging studies may be done to determine the cause of the infections. How is this treated? Treatment for this condition includes:  Antibiotic medicine.  Over-the-counter medicines to treat discomfort.  Drinking enough water to stay hydrated. If you have frequent infections or have other conditions such as a kidney stone, you may need to see a health care provider who specializes in the urinary tract (urologist). In rare cases, urinary tract infections can cause sepsis. Sepsis is a life-threatening condition that occurs when the body responds to an infection. Sepsis is treated in the hospital with IV antibiotics, fluids, and other medicines. Follow these instructions at home: Medicines  Take over-the-counter and prescription medicines only as told by your health care provider.  If you were prescribed an antibiotic medicine, take it as told by your health care provider. Do not stop using the antibiotic even if you start to feel better. General instructions  Make sure you: ? Empty your bladder often and completely. Do not hold urine for long periods of time. ? Empty your bladder after   sex. ? Wipe from front to back after urinating or having a bowel movement if you are female. Use each tissue only one time when you wipe.  Drink enough fluid to keep your urine pale yellow.  Keep all follow-up visits. This is important.   Contact a health care provider if:  Your symptoms do not get better after 1-2  days.  Your symptoms go away and then return. Get help right away if:  You have severe pain in your back or your lower abdomen.  You have a fever or chills.  You have nausea or vomiting. Summary  A urinary tract infection (UTI) is an infection of any part of the urinary tract, which includes the kidneys, ureters, bladder, and urethra.  Most urinary tract infections are caused by bacteria in your genital area.  Treatment for this condition often includes antibiotic medicines.  If you were prescribed an antibiotic medicine, take it as told by your health care provider. Do not stop using the antibiotic even if you start to feel better.  Keep all follow-up visits. This is important. This information is not intended to replace advice given to you by your health care provider. Make sure you discuss any questions you have with your health care provider. Document Revised: 09/08/2019 Document Reviewed: 09/08/2019 Elsevier Patient Education  2021 [employer]  

## 2020-07-17 NOTE — Progress Notes (Signed)
Patient ID: Chloe Perkins, female   DOB: Aug 19, 1945, 75 y.o.   MRN: [id number]  Reason for Consult: Gynecologic Exam   Referred by [name], MD  Subjective:     HPI:  Chloe Perkins is a 75 y.o. female. She reports she has been having pain with urination for 2 days. She saw a small amount of blood with wiping today. She has felt this way before with UTI> It has been about a year since her last UTI.   Gynecological History No LMP recorded. Patient has had a hysterectomy.    Past Medical History:  Diagnosis Date  . Anxiety and depression   . Arthritis   . Asthmatic bronchitis   . Atypical nevus 01/27/2011   right side abdomen-severe atypia  . Atypical nevus 11/09/2012   moderate atypia-left upper back  . Atypical nevus 11/19/2012   moderate atypia-left forearm  . BCC (basal cell carcinoma) 10/23/2002   left bridge nose-Bcc with sclerosis  . Bronchitis    history of  . Cancer ([place])    MELANOMA  . Depression   . Dysrhythmia   . Heart murmur   . Hemorrhoids   . History of melanoma   . HLD (hyperlipidemia)   . Hypertension   . Melanoma ([place]) 10/18/1997   right post sholder-melanoma level 1  . Mitral valve prolapse   . Osteoporosis   . Personal history of malignant melanoma of skin   . Superficial basal cell carcinoma (BCC) 01/10/2013   right cheek  . SVT (supraventricular tachycardia) ([place])   . SVT (supraventricular tachycardia) ([place]) 05/29/2013  . 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  . Viral gastroenteritis    Family History  Problem Relation Age of Onset  . Emphysema Father        PGF, brother, sister  . COPD Father        PGF, brother, sister  . Heart disease Father   . Rheum arthritis Mother   . Stroke Mother   . Heart disease Mother        Father, brother, sister  . Lung cancer Paternal Grandfather   . Ovarian cancer Paternal Aunt   .  Cancer Brother   . Heart disease Brother   . Heart disease Sister   . Breast cancer Neg Hx    Past Surgical History:  Procedure Laterality Date  . ABDOMINAL HYSTERECTOMY  1975  . CATARACT EXTRACTION W/PHACO Right 02/10/2017   Procedure: CATARACT EXTRACTION PHACO AND INTRAOCULAR LENS PLACEMENT (IOC);  Surgeon: Birder Robson, MD;  Location: [facility] ORS;  Service: Ophthalmology;  Laterality: Right;  [place] 00:35.9 AP% 11.5 CDE 4.11 Fluid Pack Lot # [id number]  . CATARACT EXTRACTION W/PHACO Left 03/09/2017   Procedure: CATARACT EXTRACTION PHACO AND INTRAOCULAR LENS PLACEMENT (IOC);  Surgeon: Birder Robson, MD;  Location: [facility] ORS;  Service: Ophthalmology;  Laterality: Left;  [place] 00:23 AP% 16.3 CDE 3.85 Fluid pack lot # [id number]  . COLONOSCOPY  04/15/2004   2006: Normal  . ENDOMETRIAL ABLATION  1976  . left thumb tendon repair  10/09  . MELANOMA EXCISION  1999   superficial spreading melanoma right post shoulder  . NM MYOCAR PERF WALL MOTION  12/28/2008   No ischemia  . SKIN CANCER EXCISION  03/02/2013   superficial basal cell cancer  . US ECHOCARDIOGRAPHY  10/16/2010   mitral valve leaflets midly thickened,trace MR.  . WRIST SURGERY Left 2011  Short Social History:  Social History   Tobacco Use  . Smoking status: Former Smoker    Packs/day: 0.50    Years: 20.00    Pack years: 10.00    Types: Cigarettes    Quit date: 02/09/1990    Years since quitting: 30.4  . Smokeless tobacco: Never Used  Substance Use Topics  . Alcohol use: No    Alcohol/week: 0.0 standard drinks    Allergies  Allergen Reactions  . Codeine Nausea And Vomiting  . Macrobid [Nitrofurantoin]     Upsets stomach.  . Metoprolol     Severe bradycardia   . Adhesive [Tape] Rash    PAPER TAPE ONLY (INTOLERANCE TO LEADS--EKG)  . Alendronate Sodium Other (See Comments)    REACTION: pt states INTOL to Fosamax  . Verapamil Other (See Comments)    REACTION: Intol w/ bradycardia    Current Outpatient Medications   Medication Sig Dispense Refill  . amLODipine (NORVASC) 5 MG tablet TAKE 1 TABLET(5 MG) BY MOUTH DAILY 90 tablet 3  . buPROPion (WELLBUTRIN XL) 150 MG 24 hr tablet Take 450 mg by mouth daily.    . calcium-vitamin D (OSCAL WITH D) 500-200 MG-UNIT per tablet Take 1 tablet by mouth 2 (two) times daily.    . cyanocobalamin (,VITAMIN B-12,) 1000 MCG/ML injection Inject 1,000 mcg into the muscle every 30 (thirty) days.    Marland Kitchen LORazepam (ATIVAN) 0.5 MG tablet Take 0.5 mg by mouth See admin instructions. TAKE 1 TABLET (0.5 MG) SCHEDULED IN THE MORNING & 1 TABLET (0.5 MG) IN THE EVENING AS NEEDED FOR ANXIETY.  5  . losartan (COZAAR) 100 MG tablet TAKE 1 TABLET BY MOUTH EVERY DAY 90 tablet 3  . sulfamethoxazole-trimethoprim (BACTRIM DS) 800-160 MG tablet Take 1 tablet by mouth 2 (two) times daily for 3 days. 6 tablet 0   No current facility-administered medications for this visit.    Review of Systems  Constitutional: Negative for chills, fatigue, fever and unexpected weight change.  HENT: Negative for trouble swallowing.  Eyes: Negative for loss of vision.  Respiratory: Negative for cough, shortness of breath and wheezing.  Cardiovascular: Negative for chest pain, leg swelling, palpitations and syncope.  GI: Negative for abdominal pain, blood in stool, diarrhea, nausea and vomiting.  GU: Positive for dysuria and hematuria. Negative for difficulty urinating and frequency.  Musculoskeletal: Negative for back pain, leg pain and joint pain.  Skin: Negative for rash.  Neurological: Negative for dizziness, headaches, light-headedness, numbness and seizures.  Psychiatric: Negative for behavioral problem, confusion, depressed mood and sleep disturbance.        Objective:  Objective   Vitals:   07/17/20 1052  BP: 120/70  Weight: 110 lb 3.2 oz (50 kg)  Height: 5\' 4"  (1.626 m)   Body mass index is 18.92 kg/m.  Physical Exam Vitals and nursing note reviewed. Exam conducted with a chaperone present.   Constitutional:      Appearance: Normal appearance.  HENT:     Head: Normocephalic and atraumatic.  Eyes:     Extraocular Movements: Extraocular movements intact.     Pupils: Pupils are equal, round, and reactive to light.  Cardiovascular:     Rate and Rhythm: Normal rate and regular rhythm.  Pulmonary:     Effort: Pulmonary effort is normal.     Breath sounds: Normal breath sounds.  Abdominal:     General: Abdomen is flat.     Palpations: Abdomen is soft.  Musculoskeletal:     Cervical back:  Normal range of motion.  Skin:    General: Skin is warm and dry.  Neurological:     General: No focal deficit present.     Mental Status: She is alert and oriented to person, place, and time.  Psychiatric:        Behavior: Behavior normal.        Thought Content: Thought content normal.        Judgment: Judgment normal.     Assessment/Plan:     75 yo with dysuria UA suggestive of UTI- will treat with bactrim.  Urine culture sent.  Follow up if no improvement.   More than 10 minutes were spent face to face with the patient in the room, reviewing the medical record, labs and images, and coordinating care for the patient. The plan of management was discussed in detail and counseling was provided.       Adrian Prows MD [facility], [facility] 07/17/2020 11:10 AM

## 2020-07-21 LAB — URINE CULTURE

## 2020-07-23 ENCOUNTER — Ambulatory Visit: Payer: Medicare Other

## 2020-07-30 ENCOUNTER — Ambulatory Visit (INDEPENDENT_AMBULATORY_CARE_PROVIDER_SITE_OTHER): Payer: Medicare Other

## 2020-07-30 ENCOUNTER — Other Ambulatory Visit: Payer: Self-pay

## 2020-07-30 DIAGNOSIS — E538 Deficiency of other specified B group vitamins: Secondary | ICD-10-CM

## 2020-07-30 MED ORDER — CYANOCOBALAMIN 1000 MCG/ML IJ SOLN
1000.0000 ug | Freq: Once | INTRAMUSCULAR | Status: AC
  Administered 2020-07-30: 1000 ug via INTRAMUSCULAR

## 2020-07-30 NOTE — Progress Notes (Signed)
Patient presented for B 12 injection to left deltoid, patient voiced no concerns nor showed any signs of distress during injection. 

## 2020-08-30 ENCOUNTER — Ambulatory Visit: Payer: Medicare Other

## 2020-09-03 ENCOUNTER — Other Ambulatory Visit: Payer: Self-pay

## 2020-09-03 ENCOUNTER — Ambulatory Visit (INDEPENDENT_AMBULATORY_CARE_PROVIDER_SITE_OTHER): Payer: Medicare Other

## 2020-09-03 DIAGNOSIS — E538 Deficiency of other specified B group vitamins: Secondary | ICD-10-CM

## 2020-09-03 MED ORDER — CYANOCOBALAMIN 1000 MCG/ML IJ SOLN
1000.0000 ug | Freq: Once | INTRAMUSCULAR | Status: AC
  Administered 2020-09-03: 1000 ug via INTRAMUSCULAR

## 2020-09-03 NOTE — Progress Notes (Signed)
Patient presented for B 12 injection to right deltoid, patient voiced no concerns nor showed any signs of distress during injection. 

## 2020-09-16 ENCOUNTER — Other Ambulatory Visit: Payer: Self-pay | Admitting: Obstetrics and Gynecology

## 2020-09-16 ENCOUNTER — Telehealth: Payer: Self-pay

## 2020-09-16 MED ORDER — SULFAMETHOXAZOLE-TRIMETHOPRIM 800-160 MG PO TABS: 1.0000 | ORAL_TABLET | Freq: Two times a day (BID) | ORAL | 0 refills | Status: AC

## 2020-09-16 NOTE — Telephone Encounter (Signed)
Patient is out of town and reports she has a UTI. Requesting prescription. [id number].

## 2020-09-16 NOTE — Telephone Encounter (Signed)
Rx called in 

## 2020-09-16 NOTE — Telephone Encounter (Signed)
LMVM rx sent in.

## 2020-09-16 NOTE — Telephone Encounter (Signed)
After hours/On Call fax message was delivered to me which reports patient having pain on urination that started last night. No fever. Urine is cloudy and she is having lower back pain.  Spoke w/patient. Advised we usually do not treat with out seeing patient. I do see in her chart that she was treated 07/17/20 by Dr. Gilman Schmidt with Bactrim. Advised Dr. Gilman Schmidt is out of the office. I will inquire with provider on call (Dr. Georgianne Fick). [facility] [address] [place], [place] [address] is the pharmacy if rx is sent.

## 2020-09-16 NOTE — Progress Notes (Signed)
[place], UCx from June reviewed for sensitives.  Rx bactrim called in.

## 2020-09-19 ENCOUNTER — Ambulatory Visit: Payer: Medicare Other | Admitting: Internal Medicine

## 2020-10-08 ENCOUNTER — Other Ambulatory Visit: Payer: Self-pay

## 2020-10-08 ENCOUNTER — Ambulatory Visit (INDEPENDENT_AMBULATORY_CARE_PROVIDER_SITE_OTHER): Payer: Medicare Other

## 2020-10-08 DIAGNOSIS — E538 Deficiency of other specified B group vitamins: Secondary | ICD-10-CM

## 2020-10-08 MED ORDER — CYANOCOBALAMIN 1000 MCG/ML IJ SOLN
1000.0000 ug | Freq: Once | INTRAMUSCULAR | Status: AC
  Administered 2020-10-08: 1000 ug via INTRAMUSCULAR

## 2020-10-08 NOTE — Progress Notes (Signed)
Patient presented for B 12 injection to left deltoid, patient voiced no concerns nor showed any signs of distress during injection. 

## 2020-10-24 ENCOUNTER — Ambulatory Visit: Payer: Medicare Other | Admitting: Internal Medicine

## 2020-11-08 ENCOUNTER — Telehealth: Payer: Self-pay

## 2020-11-08 ENCOUNTER — Ambulatory Visit (INDEPENDENT_AMBULATORY_CARE_PROVIDER_SITE_OTHER): Payer: Medicare Other | Admitting: Obstetrics & Gynecology

## 2020-11-08 ENCOUNTER — Ambulatory Visit: Payer: Medicare Other

## 2020-11-08 ENCOUNTER — Encounter: Payer: Self-pay | Admitting: Obstetrics & Gynecology

## 2020-11-08 ENCOUNTER — Other Ambulatory Visit: Payer: Self-pay

## 2020-11-08 VITALS — BP 120/80 | Ht 64.0 in | Wt 109.0 lb

## 2020-11-08 DIAGNOSIS — N3000 Acute cystitis without hematuria: Secondary | ICD-10-CM

## 2020-11-08 DIAGNOSIS — I2510 Atherosclerotic heart disease of native coronary artery without angina pectoris: Secondary | ICD-10-CM | POA: Diagnosis not present

## 2020-11-08 DIAGNOSIS — R300 Dysuria: Secondary | ICD-10-CM | POA: Diagnosis not present

## 2020-11-08 LAB — POCT URINALYSIS DIPSTICK
Bilirubin, UA: NEGATIVE
Blood, UA: NEGATIVE
Glucose, UA: NEGATIVE
Ketones, UA: NEGATIVE
Nitrite, UA: NEGATIVE
Protein, UA: NEGATIVE
Spec Grav, UA: 1.01 (ref 1.010–1.025)
Urobilinogen, UA: 0.2 E.U./dL
pH, UA: 5 (ref 5.0–8.0)

## 2020-11-08 MED ORDER — SULFAMETHOXAZOLE-TRIMETHOPRIM 800-160 MG PO TABS: 1.0000 | ORAL_TABLET | Freq: Two times a day (BID) | ORAL | 0 refills | Status: AC

## 2020-11-08 NOTE — Progress Notes (Signed)
HPI:      [name] is a 75 y.o. G1P1001 who LMP was No LMP recorded. Patient has had a hysterectomy., presents today for a problem visit.    Urinary Tract Infection: Patient complains of dysuria and chills  . She has had symptoms for 1 day. Patient also complains of  no back pain or fever . Patient denies headache and vaginal discharge. Patient does not have a history of recurrent UTI.  She has had 2 prior UTI in last 12 mos. Patient does not have a history of pyelonephritis.   PMHx: She  has a past medical history of Anxiety and depression, Arthritis, Asthmatic bronchitis, Atypical nevus (01/27/2011), Atypical nevus (11/09/2012), Atypical nevus (11/19/2012), BCC (basal cell carcinoma) (10/23/2002), Bronchitis, Cancer ([place]), Depression, Dysrhythmia, Heart murmur, Hemorrhoids, History of melanoma, HLD (hyperlipidemia), Hypertension, Melanoma ([place]) (10/18/1997), Mitral valve prolapse, Osteoporosis, Personal history of malignant melanoma of skin, Superficial basal cell carcinoma (BCC) (01/10/2013), SVT (supraventricular tachycardia) ([place]), SVT (supraventricular tachycardia) ([place]) (05/29/2013), Tachycardia-bradycardia syndrome ([place]) (07/06/2017), and Viral gastroenteritis. Also,  has a past surgical history that includes Endometrial ablation (1976); Melanoma excision (1999); left thumb tendon repair (10/09); Abdominal hysterectomy (1975); Colonoscopy (04/15/2004); Wrist surgery (Left, 2011); Skin cancer excision (03/02/2013); US ECHOCARDIOGRAPHY (10/16/2010); NM MYOCAR PERF WALL MOTION (12/28/2008); Cataract extraction w/PHACO (Right, 02/10/2017); and Cataract extraction w/PHACO (Left, 03/09/2017)., family history includes COPD in her father; Cancer in her brother; Emphysema in her father; Heart disease in her brother, father, mother, and sister; Lung cancer in her paternal grandfather; Ovarian cancer in her paternal aunt; Rheum arthritis in her mother; Stroke in her mother.,  reports that she quit smoking  about 30 years ago. Her smoking use included cigarettes. She has a 10.00 pack-year smoking history. She has never used smokeless tobacco. She reports that she does not drink alcohol and does not use drugs.  She has a current medication list which includes the following prescription(s): amlodipine, bupropion, calcium-vitamin d, cyanocobalamin, lorazepam, losartan, and sulfamethoxazole-trimethoprim. Also, is allergic to codeine, macrobid [nitrofurantoin], metoprolol, adhesive [tape], alendronate sodium, and verapamil.  Review of Systems  Constitutional:  Negative for chills, fever and malaise/fatigue.  HENT:  Negative for congestion, sinus pain and sore throat.   Eyes:  Negative for blurred vision and pain.  Respiratory:  Negative for cough and wheezing.   Cardiovascular:  Negative for chest pain and leg swelling.  Gastrointestinal:  Negative for abdominal pain, constipation, diarrhea, heartburn, nausea and vomiting.  Genitourinary:  Positive for dysuria. Negative for frequency, hematuria and urgency.  Musculoskeletal:  Negative for back pain, joint pain, myalgias and neck pain.  Skin:  Negative for itching and rash.  Neurological:  Negative for dizziness, tremors and weakness.  Endo/Heme/Allergies:  Does not bruise/bleed easily.  Psychiatric/Behavioral:  Negative for depression. The patient is nervous/anxious. The patient does not have insomnia.    Objective: BP 120/80   Ht 5\' 4"  (1.626 m)   Wt 109 lb (49.4 kg)   BMI 18.71 kg/m  Physical Exam Constitutional:      General: She is not in acute distress.    Appearance: She is well-developed.  Abdominal:     Tenderness: There is no abdominal tenderness. There is no right CVA tenderness or left CVA tenderness.  Musculoskeletal:        General: Normal range of motion.  Neurological:     Mental Status: She is alert and oriented to person, place, and time.  Skin:    General: Skin is warm and dry.  Vitals  reviewed.    ASSESSMENT/PLAN:    Acute cystitis  Problem List Items Addressed This Visit     Dysuria    -  Primary   Relevant Orders   POCT urinalysis dipstick (Completed)   Urine Culture   Acute cystitis without hematuria      TREAT with BACTRIM Monitor for recurrent UTI Disucssed strategies for prevention     Barnett Applebaum, MD, [facility], [facility] 11/08/2020  11:19 AM

## 2020-11-08 NOTE — Telephone Encounter (Signed)
Pt calling; has UTI and can't wait; needs something done today.  [phone]  Before having the chance to call pt she called office again.  MH called; adv to make appt c provider preferably but if not the nurses' schedule and if pt unable to come when we have availability to go to [facility] or Urgent Care.

## 2020-11-12 LAB — URINE CULTURE

## 2020-11-18 ENCOUNTER — Telehealth: Payer: Self-pay | Admitting: Cardiovascular Disease

## 2020-11-18 DIAGNOSIS — I10 Essential (primary) hypertension: Secondary | ICD-10-CM

## 2020-11-18 DIAGNOSIS — E039 Hypothyroidism, unspecified: Secondary | ICD-10-CM

## 2020-11-18 DIAGNOSIS — E785 Hyperlipidemia, unspecified: Secondary | ICD-10-CM

## 2020-11-18 NOTE — Telephone Encounter (Signed)
Spoke with the patient's husband. Fasting lab orders placed.

## 2020-11-18 NOTE — Telephone Encounter (Signed)
Patient calling to request yearly lab orders be put in for her and her husband. They both have appointments 10/21. She would like a call back when the orders are in.

## 2020-11-19 NOTE — Addendum Note (Signed)
Addended by: Ricci Barker on: 11/19/2020 11:25 AM   Modules accepted: Orders

## 2020-11-26 ENCOUNTER — Other Ambulatory Visit: Payer: Self-pay

## 2020-11-26 ENCOUNTER — Inpatient Hospital Stay: Admit: 2020-11-26 | Payer: Medicare Other

## 2020-11-26 ENCOUNTER — Telehealth: Payer: Self-pay

## 2020-11-26 ENCOUNTER — Telehealth: Payer: Self-pay | Admitting: Cardiovascular Disease

## 2020-11-26 DIAGNOSIS — I10 Essential (primary) hypertension: Secondary | ICD-10-CM

## 2020-11-26 DIAGNOSIS — E785 Hyperlipidemia, unspecified: Secondary | ICD-10-CM

## 2020-11-26 NOTE — Telephone Encounter (Signed)
Lab orders placed- LVM advising that ordered were placed and to call back with any issues.

## 2020-11-26 NOTE — Telephone Encounter (Signed)
Patient appointment was rescheduled from 10/21 due to provider emergency and will be out of office. Patient is upset that he has to wait almost 4 months to see the provider and is required to see and NP/PA to see someone sooner. Patient was infomred that Chloe Perkins is not just in the office but also works in the [place] but we will do what we can to fit him in sooner than four months.   Patient and husband agreed to appointment on 11/2 at 10:40a with Chloe Perkins. No additional questions at this time.

## 2020-11-26 NOTE — Telephone Encounter (Signed)
Patient to get labs drawn in Chloe Perkins but there were no lab orders in the system for her.  The patient does not know what labs Dr. Claiborne Billings wants her to have done.   Please assist

## 2020-11-29 ENCOUNTER — Ambulatory Visit: Payer: Medicare Other | Admitting: Cardiovascular Disease

## 2020-12-05 ENCOUNTER — Ambulatory Visit (INDEPENDENT_AMBULATORY_CARE_PROVIDER_SITE_OTHER): Payer: Medicare Other

## 2020-12-05 ENCOUNTER — Other Ambulatory Visit: Payer: Self-pay

## 2020-12-05 DIAGNOSIS — E538 Deficiency of other specified B group vitamins: Secondary | ICD-10-CM | POA: Diagnosis not present

## 2020-12-05 MED ORDER — CYANOCOBALAMIN 1000 MCG/ML IJ SOLN
1000.0000 ug | Freq: Once | INTRAMUSCULAR | Status: AC
  Administered 2020-12-05: 1000 ug via INTRAMUSCULAR

## 2020-12-05 NOTE — Progress Notes (Signed)
Patient presented for B 12 injection to left deltoid, patient voiced no concerns nor showed any signs of distress during injection. 

## 2020-12-10 ENCOUNTER — Ambulatory Visit: Payer: Medicare Other | Admitting: General Practice

## 2020-12-11 ENCOUNTER — Encounter: Payer: Self-pay | Admitting: Cardiovascular Disease

## 2020-12-11 ENCOUNTER — Other Ambulatory Visit: Payer: Self-pay

## 2020-12-11 ENCOUNTER — Ambulatory Visit (INDEPENDENT_AMBULATORY_CARE_PROVIDER_SITE_OTHER): Payer: Medicare Other | Admitting: Cardiovascular Disease

## 2020-12-11 DIAGNOSIS — I2510 Atherosclerotic heart disease of native coronary artery without angina pectoris: Secondary | ICD-10-CM | POA: Diagnosis not present

## 2020-12-11 DIAGNOSIS — I6529 Occlusion and stenosis of unspecified carotid artery: Secondary | ICD-10-CM

## 2020-12-11 DIAGNOSIS — F419 Anxiety disorder, unspecified: Secondary | ICD-10-CM

## 2020-12-11 DIAGNOSIS — I471 Supraventricular tachycardia: Secondary | ICD-10-CM | POA: Diagnosis not present

## 2020-12-11 DIAGNOSIS — I10 Essential (primary) hypertension: Secondary | ICD-10-CM

## 2020-12-11 DIAGNOSIS — I491 Atrial premature depolarization: Secondary | ICD-10-CM

## 2020-12-11 DIAGNOSIS — E785 Hyperlipidemia, unspecified: Secondary | ICD-10-CM

## 2020-12-11 DIAGNOSIS — F32A Depression, unspecified: Secondary | ICD-10-CM

## 2020-12-11 LAB — CBC
Hematocrit: 41.4 % (ref 34.0–46.6)
Hemoglobin: 13.9 g/dL (ref 11.1–15.9)
MCH: 32.3 pg (ref 26.6–33.0)
MCHC: 33.6 g/dL (ref 31.5–35.7)
MCV: 96 fL (ref 79–97)
Platelets: 177 10*3/uL (ref 150–450)
RBC: 4.31 x10E6/uL (ref 3.77–5.28)
RDW: 12 % (ref 11.7–15.4)
WBC: 3.3 10*3/uL — ABNORMAL LOW (ref 3.4–10.8)

## 2020-12-11 LAB — COMPREHENSIVE METABOLIC PANEL
ALT: 13 IU/L (ref 0–32)
AST: 21 IU/L (ref 0–40)
Albumin/Globulin Ratio: 1.9 (ref 1.2–2.2)
Albumin: 4.4 g/dL (ref 3.7–4.7)
Alkaline Phosphatase: 81 IU/L (ref 44–121)
BUN/Creatinine Ratio: 15 (ref 12–28)
BUN: 16 mg/dL (ref 8–27)
Bilirubin Total: 0.5 mg/dL (ref 0.0–1.2)
CO2: 27 mmol/L (ref 20–29)
Calcium: 10 mg/dL (ref 8.7–10.3)
Chloride: 103 mmol/L (ref 96–106)
Creatinine, Ser: 1.06 mg/dL — ABNORMAL HIGH (ref 0.57–1.00)
Globulin, Total: 2.3 g/dL (ref 1.5–4.5)
Glucose: 84 mg/dL (ref 70–99)
Potassium: 4.8 mmol/L (ref 3.5–5.2)
Sodium: 142 mmol/L (ref 134–144)
Total Protein: 6.7 g/dL (ref 6.0–8.5)
eGFR: 54 mL/min/{1.73_m2} — ABNORMAL LOW (ref >59)

## 2020-12-11 LAB — LIPID PANEL
Chol/HDL Ratio: 2.1 ratio (ref 0.0–4.4)
Cholesterol, Total: 206 mg/dL — ABNORMAL HIGH (ref 100–199)
HDL: 98 mg/dL (ref >39)
LDL Chol Calc (NIH): 98 mg/dL (ref 0–99)
Triglycerides: 52 mg/dL (ref 0–149)
VLDL Cholesterol Cal: 10 mg/dL (ref 5–40)

## 2020-12-11 LAB — TSH: TSH: 4.73 u[IU]/mL — ABNORMAL HIGH (ref 0.450–4.500)

## 2020-12-11 MED ORDER — AMLODIPINE BESYLATE 5 MG PO TABS: ORAL_TABLET | ORAL | 3 refills | Status: DC

## 2020-12-11 MED ORDER — LOSARTAN POTASSIUM 100 MG PO TABS: 100.0000 mg | ORAL_TABLET | Freq: Every day | ORAL | 3 refills | Status: DC

## 2020-12-11 NOTE — Patient Instructions (Signed)
Medication Instructions:  Continue current medications. No changes.  *If you need a refill on your cardiac medications before your next appointment, please call your pharmacy*   Follow-Up: At [facility], you and your health needs are our priority.  As part of our continuing mission to provide you with exceptional heart care, we have created designated Provider Care Teams.  These Care Teams include your primary Cardiologist (physician) and Advanced Practice Providers (APPs -  Physician Assistants and Nurse Practitioners) who all work together to provide you with the care you need, when you need it.  We recommend signing up for the patient portal called "MyChart".  Sign up information is provided on this After Visit Summary.  MyChart is used to connect with patients for Virtual Visits (Telemedicine).  Patients are able to view lab/test results, encounter notes, upcoming appointments, etc.  Non-urgent messages can be sent to your provider as well.   To learn more about what you can do with MyChart, go to [email or web address].    Your next appointment:   12 month(s)  The format for your next appointment:   In Person  Provider:   Shelva Majestic, MD

## 2020-12-11 NOTE — Progress Notes (Signed)
Patient ID: Chloe Perkins, female   DOB: 06/11/1945, 75 y.o.   MRN: [id number]    Primary M.D.: Dr. Deborra Medina  HPI: Chloe Perkins is a 75 y.o. female who presents to the office for a 12 month cardiology follow-up evaluation.  Chloe Perkins has a history of SVT and hypertension, for which she has been on diltiazem at 120 mg  and losartan 50 mg daily.  She has  also remote history of thyroid abnormality and in the past  had taken levothyroxine.  She has mild renal insufficiency with stage III renal disease.  Has a history of anxiety/depression for which she takes Wellbutrin XL 300 mg daily.  She also is on calcium with vitamin D in light of osteoporosis.  Recently, she believes that she has been experiencing more episodes of palpitations which are short-lived and at times occur daily.  She denies associated presyncope or syncope.  She denies chest pain.  She recently underwent a panoramix dental x-ray and was told by her dentist that she may have calcification of her carotids.   She has a history of osteoporosis, as well as vitamin B12 deficiency and has previously been found to have stable solitary pulmonary nodule.  She had a follow-up echo Doppler study in November 2016 which showed an ejection fraction at 60-65%.  She had normal diastolic parameters.  There was turbulent flow at the base of her aortic valve in the region of the right coronary cusp which was most likely the cause of her systolic murmur.    I last saw her, she was experiencing  transient palpitations lasting less than 5 seconds, almost on a daily basis.  She has been using caffeine.  She underwent a carotid duplex study in July 2017 which showed heterogeneous plaque bilaterally and 1-39% bilateral ICA stenoses with normal subclavian arteries, and patent vertebral arteries with antegrade flow.  I saw her, I further titrated her Cardizem and suggested that she take 240 mg at bedtime.  She's continued to take losartan 75 mg in  the morning.  Since I  saw her in February 2018, she states that she has continued to experience short-lived episodes of asymptomatic palpitations which may last anywhere from 5-10 seconds, but seemed to occur on a daily basis.  She has been on slow acting diltiazem at 240 mg daily and has continued to take losartan 75 mg daily for hypertension.  She also is on Ativan and Wellbutrin.   She underwent a CT scan by Dr. Deborra Medina.  She was found to have a stable 1 cm nodule in the right lower lobe that was unchanged from previously.  Incidentally, she was also noted to have coronary artery calcification suggestive of CAD.  Once I found out about this, I had contacted her and recommended initiation of Crestor at an initial very low dose of 10 mg.  Since she was having almost daily palpitations when I saw her several weeks ago  I also attempted to add very low-dose metoprolol succinate at 12.5 mg.  Her heart rate had dropped into the upper 40s with this and she stopped taking it; however, toprol did improve her daily palpitations.    When I saw her in October 2018 I had a lengthy discussion with her regarding the fact that she has demonstration of subclinical atherosis in the importance of trying to induce plaque regression.  After much discussion, she agreed to initiate low-dose statin therapy.  She was started on low-dose Crestor.  Apparently,  she self discontinued this since she felt this caused her to be nauseated.  On for brief 14 2018.  Repeat laboratory off treatment showed a total cholesterol 2:15, HDL 87, LDL 114, and triglycerides 71.    When I saw her in March 2019 she had discontinued Crestor because of feeling nauseated which she attributed to Crestor and I initiated Zetia.  She saw [name] on Jul 06, 2017 as an add-on for slow heart rate.  Heart rate on his exam was 48.  He decreased diltiazem back to 120 mg daily   Apparently, Chloe Perkins did not feel well on the 120 of diltiazem and for this  reason went back to take her previous 180 mg dose.  She had noticed her recent pressure elevation and also had noticed a mild headache.  She took the reduced dose ifor 5 days and then went back to 180 mg with feeling improved.    When I saw her on July 29, 2017 for follow-up her blood pressure was elevated on losartan 100 mg daily.  At that time I suggested she discontinue diltiazem and changed her to metoprolol tartrate 25 mg twice a day depending upon her heart rate.  Her ECG during that evaluation showed a bigeminal rhythm with alternating sinus and low atrial beats.  She was tolerating Zetia for hyperlipidemia in light of her documentation of subclinical atherosclerosis.  Apparently, she had called the office and was having issues with elevated blood pressure and slow heart rate.  During my absence, her metoprolol was stopped and she was started on amlodipine 5 mg daily and to use the metoprolol 12.5 mg on an as-needed basis.  She wore a  Monitor for 5 days saw Dr. Caryl Comes for follow-up evaluation.  She is unaware of any recurrent episodes of tachycardia.  She denies presyncope or syncope.  I saw her in March 2020 at which time she was doing well and denied any recurrent episodes of palpitations.  She was  on amlodipine 5 mg in addition to losartan 100 mg hypertension.  I had started her on Zetia for hyperlipidemia but apparently she was not taking this.. She continued to take bupropion 450 mg for depression.  She had seen Dr. Deborra Medina for annual exam and laboratory from that date was reviewed.  Recent lipid panel in November 2019 was 89 with an HDL of 85.    I saw her in December 2020 at which time she felt that she was stable from a cardiac standpoint.  She denied any chest pain but had noted some shortness of breath with step walking.    Since my evaluation she was seen by Dr. Caryl Comes on February 09, 2019.  Pulse was 59 and blood pressure was stable.  She had documented PACs and he reassured her that  her PACs were benign.  There was no documentation of atrial fibrillation.   She was last evaluated by me in a telemedicine visit on August 02, 2019. Over the prior 6 months, she had experienced some episodes of shortness of breath with activity.  She continued to take Wellbutrin for depression.  She underwent evaluation of thyroid function and TSH was 6.7.  Presently she denies chest pain or shortness of breath.  She had not had recent labs.  She had not had a recent echo since 2016 and with her exertional dyspnea I have recommended that she undergo a follow-up echo evaluation.  She underwent an echo Doppler study on August 25 2019.  This  showed an EF of 60 to 65%.  Diastolic parameters were normal.  She had normal pulmonary artery systolic pressure.  I saw her in follow-up on December 05, 2019 at which time she felt well.  She was not exercising and was having bar spine issues.  Several weeks previously she had undergone gone on a steroid injection with some improvement.  She is unaware of any recurrent palpitations.    Since I last saw her, she has remained stable.  She had laboratory drawn today which was not yet available for this office visit.  Her blood pressures at home typically run in the 338S with diastolics in the 50N.  She has continued to be on amlodipine 5 mg and losartan 100 mg for hypertension.  She is on bupropion 450 mg daily for depression and takes as needed lorazepam.  She sees Dr. Deborra Medina for primary care.  She presents for evaluation.   Past Medical History:  Diagnosis Date   Anxiety and depression    Arthritis    Asthmatic bronchitis    Atypical nevus 01/27/2011   right side abdomen-severe atypia   Atypical nevus 11/09/2012   moderate atypia-left upper back   Atypical nevus 11/19/2012   moderate atypia-left forearm   BCC (basal cell carcinoma) 10/23/2002   left bridge nose-Bcc with sclerosis   Bronchitis    history of   Cancer ([place])    MELANOMA   Depression     Dysrhythmia    Heart murmur    Hemorrhoids    History of melanoma    HLD (hyperlipidemia)    Hypertension    Melanoma ([place]) 10/18/1997   right post sholder-melanoma level 1   Mitral valve prolapse    Osteoporosis    Personal history of malignant melanoma of skin    Superficial basal cell carcinoma (BCC) 01/10/2013   right cheek   SVT (supraventricular tachycardia) (HCC)    SVT (supraventricular tachycardia) ([place]) 05/29/2013   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   Viral gastroenteritis     Past Surgical History:  Procedure Laterality Date   ABDOMINAL HYSTERECTOMY  1975   CATARACT EXTRACTION W/PHACO Right 02/10/2017   Procedure: CATARACT EXTRACTION PHACO AND INTRAOCULAR LENS PLACEMENT ([place]);  Surgeon: Birder Robson, MD;  Location: [facility] ORS;  Service: Ophthalmology;  Laterality: Right;  [place] 00:35.9 AP% 11.5 CDE 4.11 Fluid Pack Lot # [id number]   CATARACT EXTRACTION W/PHACO Left 03/09/2017   Procedure: CATARACT EXTRACTION PHACO AND INTRAOCULAR LENS PLACEMENT ([facility]);  Surgeon: Birder Robson, MD;  Location: [facility] OR[facility];  Service: Ophthalmology;  Laterality: Left;  [place] 00:23 AP% 16.3 CDE 3.85 Fluid pack lot # [id number]   COLONOSCOPY  04/15/2004   2006: Normal   ENDOMETRIAL ABLATION  1976   left thumb tendon repair  10/09   MELANOMA EXCISION  1999   superficial spreading melanoma right post shoulder   NM MYOCAR PERF WALL MOTION  12/28/2008   No ischemia   SKIN CANCER EXCISION  03/02/2013   superficial basal cell cancer   US ECHOCARDIOGRAPHY  10/16/2010   mitral valve leaflets midly thickened,trace MR.   WRIST SURGERY Left 2011    Allergies  Allergen Reactions   Codeine Nausea And Vomiting   Macrobid [Nitrofurantoin]     Upsets stomach.   Metoprolol     Severe bradycardia    Adhesive [Tape] Rash    PAPER TAPE ONLY (INTOLERANCE TO LEADS--EKG)   Alendronate  Sodium Other (See  Comments)    REACTION: pt states INTOL to Fosamax   Verapamil Other (See Comments)    REACTION: Intol w/ bradycardia    Current Outpatient Medications  Medication Sig Dispense Refill   buPROPion (WELLBUTRIN XL) 150 MG 24 hr tablet Take 450 mg by mouth daily.     calcium-vitamin D (OSCAL WITH D) 500-200 MG-UNIT per tablet Take 1 tablet by mouth 2 (two) times daily.     cyanocobalamin (,VITAMIN B-12,) 1000 MCG/ML injection Inject 1,000 mcg into the muscle every 30 (thirty) days.     LORazepam (ATIVAN) 0.5 MG tablet Take 0.5 mg by mouth See admin instructions. TAKE 1 TABLET (0.5 MG) SCHEDULED IN THE MORNING & 1 TABLET (0.5 MG) IN THE EVENING AS NEEDED FOR ANXIETY.  5   amLODipine (NORVASC) 5 MG tablet TAKE 1 TABLET(5 MG) BY MOUTH DAILY 90 tablet 3   losartan (COZAAR) 100 MG tablet Take 1 tablet (100 mg total) by mouth daily. 90 tablet 3   No current facility-administered medications for this visit.    Social History   Socioeconomic History   Marital status: Married    Spouse name: sammy Drees   Number of children: 1   Years of education: Not on file   Highest education level: Not on file  Occupational History   Occupation: Retired  Tobacco Use   Smoking status: Former    Packs/day: 0.50    Years: 20.00    Pack years: 10.00    Types: Cigarettes    Quit date: 02/09/1990    Years since quitting: 30.8   Smokeless tobacco: Never  Vaping Use   Vaping Use: Never used  Substance and Sexual Activity   Alcohol use: No    Alcohol/week: 0.0 standard drinks   Drug use: No   Sexual activity: Yes  Other Topics Concern   Not on file  Social History Narrative   She lives her family, she has one child, retired, no smoke no drink   Social Determinants of Radio broadcast assistant Strain: Low Risk    Difficulty of Paying Living Expenses: Not hard at all  Food Insecurity: No Food Insecurity   Worried About Charity fundraiser in the Last Year: Never true   Arboriculturist in the Last  Year: Never true  Transportation Needs: No Transportation Needs   Lack of Transportation (Medical): No   Lack of Transportation (Non-Medical): No  Physical Activity: Not on file  Stress: No Stress Concern Present   Feeling of Stress : Not at all  Social Connections: Unknown   Frequency of Communication with Friends and Family: Not on file   Frequency of Social Gatherings with Friends and Family: Not on file   Attends Religious Services: Not on Electrical engineer or Organizations: Not on file   Attends Archivist Meetings: Not on file   Marital Status: Married  Human resources officer Violence: Not At Risk   Fear of Current or Ex-Partner: No   Emotionally Abused: No   Physically Abused: No   Sexually Abused: No    Family History  Problem Relation Age of Onset   Emphysema Father        PGF, brother, sister   COPD Father        PGF, brother, sister   Heart disease Father    Rheum arthritis Mother    Stroke Mother    Heart disease Mother  Father, brother, sister   Lung cancer Paternal Grandfather    Ovarian cancer Paternal Aunt    Cancer Brother    Heart disease Brother    Heart disease Sister    Breast cancer Neg Hx     ROS General: Negative; No fevers, chills, or night sweats;  HEENT: Negative; No changes in vision or hearing, sinus congestion, difficulty swallowing Pulmonary: Stable lung nodule Cardiovascular: see HPI GI: Negative; No nausea, vomiting, diarrhea, or abdominal pain GU: Negative; No dysuria, hematuria, or difficulty voiding Musculoskeletal: she complains of chronic back pain; status post recent spinal injection Hematologic/Oncology: Negative; no easy bruising, bleeding Endocrine: Negative; no heat/cold intolerance; no diabetes Neuro: Negative; no changes in balance, headaches Skin: Negative; No rashes or skin lesions Psychiatric: Positive for anxiety/depression on Wellbutrin Sleep: Negative; No snoring, daytime sleepiness,  hypersomnolence, bruxism, restless legs, hypnogognic hallucinations, no cataplexy Other comprehensive 14 point system review is negative.   PE BP 124/70 (BP Location: Left Arm, Patient Position: Sitting, Cuff Size: Normal)   Pulse 61   Ht 5' 4"  (1.626 m)   Wt 109 lb 6.4 oz (49.6 kg)   SpO2 99%   BMI 18.78 kg/m    Repeat blood pressure by me was 128/70  Wt Readings from Last 3 Encounters:  12/11/20 109 lb 6.4 oz (49.6 kg)  11/08/20 109 lb (49.4 kg)  07/17/20 110 lb 3.2 oz (50 kg)   General: Alert, oriented, no distress.  Skin: normal turgor, no rashes, warm and dry HEENT: Normocephalic, atraumatic. Pupils equal round and reactive to light; sclera anicteric; extraocular muscles intact; Nose without nasal septal hypertrophy Mouth/Parynx benign; Mallinpatti scale 2 Neck: No JVD, no carotid bruits; normal carotid upstroke Lungs: clear to ausculatation and percussion; no wheezing or rales Chest wall: without tenderness to palpitation Heart: PMI not displaced, RRR, s1 s2 normal, 1/6 systolic murmur, no diastolic murmur, no rubs, gallops, thrills, or heaves Abdomen: soft, nontender; no hepatosplenomehaly, BS+; abdominal aorta nontender and not dilated by palpation. Back: no CVA tenderness Pulses 2+ Musculoskeletal: full range of motion, normal strength, no joint deformities Extremities: no clubbing cyanosis or edema, Homan's sign negative  Neurologic: grossly nonfocal; Cranial nerves grossly wnl Psychologic: Normal mood and affect  December 11, 2020  ECG (independently read by me):  Sinus rhythm at 61, sinus arrythmia  December 05, 2019 ECG (independently read by me): NSR at 87; no ectopy; normal intervals  January 10, 2019 ECG (independently read by me): Normal sinus rhythm at 89 bpm.  Low voltage.  Small Q-wave in lead III.  No ectopy.  Normal intervals.  April 11, 2018 ECG (independently read by me): Sinus bradycardia with mild sinus arrhythmia, rate 51.  Low voltage.  Normal  intervals.  No ectopy.  August 31, 2017 ECG (independently read by me): Normal sinus rhythm 80 bpm.  No ectopy.  Mild RV conduction delay.  Normal intervals.  July 29, 2017 ECG (independently read by me): Bigeminy rhythm with alternating sinus and low atrial beat heart rate 60 bpm.  May 05, 2017 ECG (independently read by me): normal sinus rhythm at 62 with a PAC.  Low voltage.  Normal intervals.  October 2018 ECG (independently read by me): Sinus rhythm at 65 bpm, PAC.  PR interval 128 ms, QTc interval 428 ms.  September 2018 ECG (independently read by me): Normal sinus rhythm at 64 bpm, PA-C, mild RV conduction delay.  Nonspecific ST changes.  February 2017 ECG (independently read by me): Sinus bradycardia 53 bpm..  Mild RV  conduction delay.  Normal intervals.  November 2017 ECG (independently read by me): Normal sinus rhythm at 61 bpm.  No ectopy.  Normal intervals.  July 2017 ECG (independently read by me): Sinus rhythm at 60 bpm with PAC.  Normal intervals.  November 2016 ECG (independently read by me): Normal sinus rhythm at 66 bpm.  Mild RV conduction delay.  Nondiagnostic small inferior Q waves.  ECG (independently read by me): sinus bradycardia 50 bpm.  Mild RV conduction delay  November 2015 ECG (independently read by me0;  Normal sinus rhythm at 77 bpm.  RV conduction delay.  Nondiagnostic small Q waves.  Nonspecific ST changes.  April 2015 ECG (independently read by me): Sinus bradycardia 54 beats per minute.  Mild RV conduction delay.  No ectopy.  LABS: BMP Latest Ref Rng & Units 12/11/2020 12/05/2019 12/16/2017  Glucose 70 - 99 mg/dL 84 82 94  BUN 8 - 27 mg/dL 16 21 22   Creatinine 0.57 - 1.00 mg/dL 1.06(H) 1.01(H) 1.01  BUN/Creat Ratio 12 - 28 15 21  -  Sodium 134 - 144 mmol/L 142 141 140  Potassium 3.5 - 5.2 mmol/L 4.8 4.8 4.6  Chloride 96 - 106 mmol/L 103 102 103  CO2 20 - 29 mmol/L 27 25 29   Calcium 8.7 - 10.3 mg/dL 10.0 10.1 10.7(H)   Hepatic Function Latest Ref  Rng & Units 12/11/2020 12/05/2019 12/16/2017  Total Protein 6.0 - 8.5 g/dL 6.7 6.9 7.4  Albumin 3.7 - 4.7 g/dL 4.4 4.5 4.6  AST 0 - 40 IU/L 21 24 21   ALT 0 - 32 IU/L 13 13 13   Alk Phosphatase 44 - 121 IU/L 81 87 60  Total Bilirubin 0.0 - 1.2 mg/dL 0.5 0.3 0.5  Bilirubin, Direct 0.0 - 0.3 mg/dL - - -   CBC Latest Ref Rng & Units 12/11/2020 12/05/2019 02/09/2019  WBC 3.4 - 10.8 x10E3/uL 3.3(L) 4.7 4.4  Hemoglobin 11.1 - 15.9 g/dL 13.9 13.5 13.8  Hematocrit 34.0 - 46.6 % 41.4 40.3 40.9  Platelets 150 - 450 x10E3/uL 177 181 175   Lab Results  Component Value Date   MCV 96 12/11/2020   MCV 97 12/05/2019   MCV 94 02/09/2019   Lab Results  Component Value Date   TSH 4.730 (H) 12/11/2020  No results found for: HGBA1C  Lipid Panel     Component Value Date/Time   CHOL 206 (H) 12/11/2020 0823   CHOL 203 (H) 05/22/2011 0759   TRIG 52 12/11/2020 0823   TRIG 65 05/22/2011 0759   HDL 98 12/11/2020 0823   HDL 88 (H) 05/22/2011 0759   CHOLHDL 2.1 12/11/2020 0823   CHOLHDL 2 12/16/2017 1346   VLDL 16.6 12/16/2017 1346   VLDL 13 05/22/2011 0759   LDLCALC 98 12/11/2020 0823   LDLCALC 102 (H) 05/22/2011 0759   LDLDIRECT 102.2 04/23/2006 1211     RADIOLOGY: No results found.  IMPRESSION:  1. Essential hypertension   2. Hyperlipidemia with target LDL less than 70   3. SVT (supraventricular tachycardia) (HCC)   4. Carotid artery plaque, unspecified laterality   5. Coronary artery calcification seen on CT scan   6. Anxiety and depression     ASSESSMENT AND PLAN: Chloe Perkins is a 75 year-old female who has a history of SVT as well as hypertension. Remotely she had experiencing more frequent palpitations which led to further dose escalation of Cardizem up to 240 mg with improvement and due to blood pressure issues losartan was titrated up to 100  mg.  She had developed bradycardia and an attempt was made to reduce diltiazem but she felt this contributed to her increased blood  pressure and did not feel well.  On diltiazem, low-dose metoprolol had been added but due to bradycardia this was discontinued.  Later, diltiazem substituted by amlodipine and blood pressure improved.  Today is excellent on her current regimen of amlodipine 5 mg and losartan 100 mg daily.  Her last echo Doppler study from August 25, 2019 was again reviewed which showed normal systolic and diastolic function, no significant valvular issues and normal pulmonary artery pressures.  In the past I had recommended initiation of Zetia this was never done.  She had lab work drawn today and I will see if there is any significant change from her prior December 05, 2019 LDL which was at 68 and total cholesterol 601.  She has a history of very elevated HDL.  She continues to be on bupropion 40 and 50 mg daily for her depression.  She has previously documented mild carotid plaque and coronary calcification on CT imaging.  I will contact her regarding the results of her laboratory and adjustments will be made if necessary.  Otherwise I will see her in 1 year for reevaluation or sooner as needed.    Troy Sine, MD, [facility]  12/13/2020 3:04 PM

## 2020-12-13 ENCOUNTER — Encounter: Payer: Self-pay | Admitting: Cardiovascular Disease

## 2020-12-18 ENCOUNTER — Encounter: Payer: Self-pay | Admitting: Obstetrics and Gynecology

## 2020-12-18 ENCOUNTER — Ambulatory Visit: Payer: Medicare Other | Admitting: Obstetrics and Gynecology

## 2020-12-18 ENCOUNTER — Other Ambulatory Visit: Payer: Self-pay

## 2020-12-18 ENCOUNTER — Ambulatory Visit (INDEPENDENT_AMBULATORY_CARE_PROVIDER_SITE_OTHER): Payer: Medicare Other | Admitting: Obstetrics and Gynecology

## 2020-12-18 VITALS — BP 120/70 | Ht 64.0 in | Wt 110.0 lb

## 2020-12-18 DIAGNOSIS — I6529 Occlusion and stenosis of unspecified carotid artery: Secondary | ICD-10-CM

## 2020-12-18 DIAGNOSIS — R399 Unspecified symptoms and signs involving the genitourinary system: Secondary | ICD-10-CM | POA: Diagnosis not present

## 2020-12-18 LAB — POCT URINALYSIS DIPSTICK
Bilirubin, UA: NEGATIVE
Glucose, UA: NEGATIVE
Ketones, UA: NEGATIVE
Nitrite, UA: NEGATIVE
Protein, UA: NEGATIVE
Spec Grav, UA: 1.01 (ref 1.010–1.025)
pH, UA: 6 (ref 5.0–8.0)

## 2020-12-18 MED ORDER — SULFAMETHOXAZOLE-TRIMETHOPRIM 800-160 MG PO TABS: 1.0000 | ORAL_TABLET | Freq: Two times a day (BID) | ORAL | 0 refills | Status: AC

## 2020-12-18 NOTE — Progress Notes (Signed)
Chloe Mc, MD   Chief Complaint  Patient presents with   Urinary Tract Infection    Frequency and burning urinating since last night    HPI:      Chloe Perkins is a 75 y.o. G1P1001 whose LMP was No LMP recorded. Patient has had a hysterectomy., presents today for UTI sx of urinary urgency and frequency with dysuria since last night. No LBP, pelvic pain, fevers, vag sx, or PMB. This is pt's 3rd UTI this yr. Saw urology in past with neg eval, suggested she take cranberry supp which she does. Pt drinks very little water/fluids daily.  Hx of E. Coli on prior C&S, treated to bactrim with sx relief. Allergic to macrobid  Past Medical History:  Diagnosis Date   Anxiety and depression    Arthritis    Asthmatic bronchitis    Atypical nevus 01/27/2011   right side abdomen-severe atypia   Atypical nevus 11/09/2012   moderate atypia-left upper back   Atypical nevus 11/19/2012   moderate atypia-left forearm   BCC (basal cell carcinoma) 10/23/2002   left bridge nose-Bcc with sclerosis   Bronchitis    history of   Cancer ([place])    MELANOMA   Depression    Dysrhythmia    Heart murmur    Hemorrhoids    History of melanoma    HLD (hyperlipidemia)    Hypertension    Melanoma ([place]) 10/18/1997   right post sholder-melanoma level 1   Mitral valve prolapse    Osteoporosis    Personal history of malignant melanoma of skin    Superficial basal cell carcinoma (BCC) 01/10/2013   right cheek   SVT (supraventricular tachycardia) ([facility])    SVT (supraventricular tachycardia) ([place]) 05/29/2013   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   Viral gastroenteritis     Past Surgical History:  Procedure Laterality Date   ABDOMINAL HYSTERECTOMY  1975   CATARACT EXTRACTION W/PHACO Right 02/10/2017   Procedure: CATARACT EXTRACTION PHACO AND INTRAOCULAR LENS PLACEMENT ([place]);  Surgeon: Birder Robson, MD;  Location: [facility] ORS;  Service: Ophthalmology;  Laterality: Right;  [place] 00:35.9 AP% 11.5 CDE 4.11 Fluid Pack Lot # [id number]   CATARACT EXTRACTION W/PHACO Left 03/09/2017   Procedure: CATARACT EXTRACTION PHACO AND INTRAOCULAR LENS PLACEMENT ([facility]);  Surgeon: Birder Robson, MD;  Location: [facility] OR[facility];  Service: Ophthalmology;  Laterality: Left;  [place] 00:23 AP% 16.3 CDE 3.85 Fluid pack lot # [id number]   COLONOSCOPY  04/15/2004   2006: Normal   ENDOMETRIAL ABLATION  1976   left thumb tendon repair  10/09   MELANOMA EXCISION  1999   superficial spreading melanoma right post shoulder   NM MYOCAR PERF WALL MOTION  12/28/2008   No ischemia   SKIN CANCER EXCISION  03/02/2013   superficial basal cell cancer   US ECHOCARDIOGRAPHY  10/16/2010   mitral valve leaflets midly thickened,trace MR.   WRIST SURGERY Left 2011    Family History  Problem Relation Age of Onset   Emphysema Father        PGF, brother, sister   COPD Father        PGF, brother, sister   Heart disease Father    Rheum arthritis Mother    Stroke Mother    Heart disease Mother        Father, brother, sister   Lung cancer Paternal Grandfather    Ovarian cancer  Paternal Aunt    Cancer Brother    Heart disease Brother    Heart disease Sister    Breast cancer Neg Hx     Social History   Socioeconomic History   Marital status: Married    Spouse name: sammy Delisa   Number of children: 1   Years of education: Not on file   Highest education level: Not on file  Occupational History   Occupation: Retired  Tobacco Use   Smoking status: Former    Packs/day: 0.50    Years: 20.00    Pack years: 10.00    Types: Cigarettes    Quit date: 02/09/1990    Years since quitting: 30.8   Smokeless tobacco: Never  Vaping Use   Vaping Use: Never used  Substance and Sexual Activity   Alcohol use: No    Alcohol/week: 0.0 standard drinks   Drug use: No   Sexual activity: Yes  Other Topics Concern   Not on file  Social  History Narrative   She lives her family, she has one child, retired, no smoke no drink   Social Determinants of Radio broadcast assistant Strain: Low Risk    Difficulty of Paying Living Expenses: Not hard at all  Food Insecurity: No Food Insecurity   Worried About Charity fundraiser in the Last Year: Never true   Arboriculturist in the Last Year: Never true  Transportation Needs: No Transportation Needs   Lack of Transportation (Medical): No   Lack of Transportation (Non-Medical): No  Physical Activity: Not on file  Stress: No Stress Concern Present   Feeling of Stress : Not at all  Social Connections: Unknown   Frequency of Communication with Friends and Family: Not on file   Frequency of Social Gatherings with Friends and Family: Not on file   Attends Religious Services: Not on file   Active Member of Clubs or Organizations: Not on file   Attends Archivist Meetings: Not on file   Marital Status: Married  Human resources officer Violence: Not At Risk   Fear of Current or Ex-Partner: No   Emotionally Abused: No   Physically Abused: No   Sexually Abused: No    Outpatient Medications Prior to Visit  Medication Sig Dispense Refill   amLODipine (NORVASC) 5 MG tablet TAKE 1 TABLET(5 MG) BY MOUTH DAILY 90 tablet 3   buPROPion (WELLBUTRIN XL) 150 MG 24 hr tablet Take 450 mg by mouth daily.     calcium-vitamin D (OSCAL WITH D) 500-200 MG-UNIT per tablet Take 1 tablet by mouth 2 (two) times daily.     cyanocobalamin (,VITAMIN B-12,) 1000 MCG/ML injection Inject 1,000 mcg into the muscle every 30 (thirty) days.     LORazepam (ATIVAN) 0.5 MG tablet Take 0.5 mg by mouth See admin instructions. TAKE 1 TABLET (0.5 MG) SCHEDULED IN THE MORNING & 1 TABLET (0.5 MG) IN THE EVENING AS NEEDED FOR ANXIETY.  5   losartan (COZAAR) 100 MG tablet Take 1 tablet (100 mg total) by mouth daily. 90 tablet 3   No facility-administered medications prior to visit.      ROS:  Review of Systems   Constitutional:  Negative for fever.  Gastrointestinal:  Negative for blood in stool, constipation, diarrhea, nausea and vomiting.  Genitourinary:  Positive for dysuria, frequency and urgency. Negative for dyspareunia, flank pain, hematuria, vaginal bleeding, vaginal discharge and vaginal pain.  Musculoskeletal:  Negative for back pain.  Skin:  Negative for rash.  BREAST: No symptoms   OBJECTIVE:   Vitals:  BP 120/70   Ht 5\' 4"  (1.626 m)   Wt 110 lb (49.9 kg)   BMI 18.88 kg/m   Physical Exam Vitals reviewed.  Constitutional:      Appearance: She is well-developed. She is not ill-appearing or toxic-appearing.  Pulmonary:     Effort: Pulmonary effort is normal.  Abdominal:     Tenderness: There is no right CVA tenderness or left CVA tenderness.  Musculoskeletal:        General: Normal range of motion.     Cervical back: Normal range of motion.  Neurological:     General: No focal deficit present.     Mental Status: She is alert and oriented to person, place, and time.     Cranial Nerves: No cranial nerve deficit.  Psychiatric:        Behavior: Behavior normal.        Thought Content: Thought content normal.        Judgment: Judgment normal.    Results: Results for orders placed or performed in visit on 12/18/20 (from the past 24 hour(s))  POCT Urinalysis Dipstick     Status: Abnormal   Collection Time: 12/18/20  9:59 AM  Result Value Ref Range   Color, UA yellow    Clarity, UA clear    Glucose, UA Negative Negative   Bilirubin, UA neg    Ketones, UA neg    Spec Grav, UA 1.010 1.010 - 1.025   Blood, UA trace    pH, UA 6.0 5.0 - 8.0   Protein, UA Negative Negative   Urobilinogen, UA     Nitrite, UA neg    Leukocytes, UA Small (1+) (A) Negative   Appearance     Odor       Assessment/Plan: UTI symptoms - Plan: POCT Urinalysis Dipstick, Urine Culture, sulfamethoxazole-trimethoprim (BACTRIM DS) 800-160 MG tablet; pos sx and UA. Rx bactrim. Check C&S. Recommended  increasing water intake to prevent UTIs. May also need daily abx as preventive vs ext vag ERT. Pt to f/u prn.    Meds ordered this encounter  Medications   sulfamethoxazole-trimethoprim (BACTRIM DS) 800-160 MG tablet    Sig: Take 1 tablet by mouth 2 (two) times daily for 7 days.    Dispense:  14 tablet    Refill:  0    Order Specific Question:   Supervising Provider    Answer:   Gae Dry [[id number]]      Return if symptoms worsen or fail to improve.  Chloe Rayson B. Tytianna Greenley, PA-C 12/18/2020 10:01 AM

## 2020-12-24 LAB — URINE CULTURE: Organism ID, Bacteria: NO GROWTH

## 2020-12-24 NOTE — Progress Notes (Signed)
Pls let pt know C&S neg for UTI. Can complete abx but make sure to increase water to prevent/decrease sx, as discussed at appt. Dehydration can mimic UTI sx. F/u prn.

## 2021-01-08 ENCOUNTER — Other Ambulatory Visit: Payer: Self-pay

## 2021-01-08 ENCOUNTER — Ambulatory Visit (INDEPENDENT_AMBULATORY_CARE_PROVIDER_SITE_OTHER): Payer: Medicare Other

## 2021-01-08 DIAGNOSIS — E538 Deficiency of other specified B group vitamins: Secondary | ICD-10-CM | POA: Diagnosis not present

## 2021-01-08 MED ORDER — CYANOCOBALAMIN 1000 MCG/ML IJ SOLN
1000.0000 ug | Freq: Once | INTRAMUSCULAR | Status: AC
  Administered 2021-01-08: 1000 ug via INTRAMUSCULAR

## 2021-01-08 NOTE — Progress Notes (Signed)
Patient presented for B 12 injection to right deltoid, patient voiced no concerns nor showed any signs of distress during injection. 

## 2021-02-07 ENCOUNTER — Other Ambulatory Visit: Payer: Self-pay

## 2021-02-07 ENCOUNTER — Ambulatory Visit (INDEPENDENT_AMBULATORY_CARE_PROVIDER_SITE_OTHER): Payer: Medicare Other

## 2021-02-07 DIAGNOSIS — E538 Deficiency of other specified B group vitamins: Secondary | ICD-10-CM | POA: Diagnosis not present

## 2021-02-07 MED ORDER — CYANOCOBALAMIN 1000 MCG/ML IJ SOLN
1000.0000 ug | Freq: Once | INTRAMUSCULAR | Status: AC
  Administered 2021-02-07: 1000 ug via INTRAMUSCULAR

## 2021-02-07 NOTE — Progress Notes (Signed)
Chloe Perkins presents today for injection per MD orders. B12 injection administered IM in right Upper Arm. Administration without incident. Patient tolerated well.  Dorsel Flinn,cma   

## 2021-02-13 ENCOUNTER — Ambulatory Visit (INDEPENDENT_AMBULATORY_CARE_PROVIDER_SITE_OTHER): Payer: Medicare Other

## 2021-02-13 ENCOUNTER — Other Ambulatory Visit: Payer: Self-pay

## 2021-02-13 ENCOUNTER — Telehealth: Payer: Self-pay

## 2021-02-13 ENCOUNTER — Other Ambulatory Visit: Payer: Self-pay | Admitting: Obstetrics & Gynecology

## 2021-02-13 DIAGNOSIS — R300 Dysuria: Secondary | ICD-10-CM

## 2021-02-13 LAB — POCT URINALYSIS DIPSTICK
Bilirubin, UA: NEGATIVE
Blood, UA: NEGATIVE
Glucose, UA: NEGATIVE
Ketones, UA: NEGATIVE
Nitrite, UA: NEGATIVE
Protein, UA: NEGATIVE
Spec Grav, UA: 1.01 (ref 1.010–1.025)
Urobilinogen, UA: 0.2 E.U./dL
pH, UA: 5 (ref 5.0–8.0)

## 2021-02-13 NOTE — Telephone Encounter (Signed)
Pt was on nurse schedule for urine drop off. Positive for leukocytes, Can you put orders for a urine culture? Or I can

## 2021-02-14 ENCOUNTER — Other Ambulatory Visit: Payer: Self-pay | Admitting: Obstetrics & Gynecology

## 2021-02-14 DIAGNOSIS — N3000 Acute cystitis without hematuria: Secondary | ICD-10-CM

## 2021-02-14 MED ORDER — AMOXICILLIN 500 MG PO CAPS: 500.0000 mg | ORAL_CAPSULE | Freq: Three times a day (TID) | ORAL | 0 refills | Status: DC

## 2021-02-15 LAB — URINE CULTURE: Organism ID, Bacteria: NO GROWTH

## 2021-02-15 NOTE — Progress Notes (Signed)
Let her know No Infection by culture

## 2021-02-17 ENCOUNTER — Telehealth: Payer: Self-pay

## 2021-02-17 NOTE — Telephone Encounter (Signed)
Pt aware and relieved.

## 2021-02-17 NOTE — Telephone Encounter (Signed)
Psl notify pt urine C&S neg. No need for abx. Can do antidiarrhea protocol that you suggested. Can also add probiotics. Pt has had several  neg C&S for sx with neg urology eval in past. Pt needs to increase water intake as dehydration can mimic UTI sx.

## 2021-02-17 NOTE — Telephone Encounter (Addendum)
Pt calling; dropped off urine Fri; was rx'd amoxicillin; started having diarrhea Sat am p starting amox Fri pm; she is supposed to take amoxicillin for 7d; she did not take amoxicillin last night and not taking any more; wants to know what to take for diarrhea as she has already had it 3times today. [phone]  Pt also called front desk and tx'd from [place].  Adv pt she can take immodium, kaopectate, or donogel or pepto; to let system rest and just sip on clear liquids today, bland food tomorrow, and work up to her normal diet Wednesday. Adv will send to provider for change in antibx. Pt also states it  no longer hurts her to void.

## 2021-02-18 NOTE — Progress Notes (Signed)
Pt aware.

## 2021-02-21 ENCOUNTER — Ambulatory Visit (INDEPENDENT_AMBULATORY_CARE_PROVIDER_SITE_OTHER): Payer: Medicare Other

## 2021-02-21 VITALS — Ht 64.0 in | Wt 110.0 lb

## 2021-02-21 DIAGNOSIS — Z0000 Encounter for general adult medical examination without abnormal findings: Secondary | ICD-10-CM | POA: Diagnosis not present

## 2021-02-21 NOTE — Patient Instructions (Addendum)
Chloe Perkins , Thank you for taking time to come for your Medicare Wellness Visit. I appreciate your ongoing commitment to your health goals. Please review the following plan we discussed and let me know if I can assist you in the future.   These are the goals we discussed:  Goals      Increase physical activity     Walk for exercise        This is a list of the screening recommended for you and due dates:  Health Maintenance  Topic Date Due   COVID-19 Vaccine (4 - Booster for Pfizer series) 03/09/2021*   Flu Shot  05/09/2021*   Zoster (Shingles) Vaccine (1 of 2) 05/22/2021*   Mammogram  07/02/2021   Tetanus Vaccine  07/08/2025   Pneumonia Vaccine  Completed   DEXA scan (bone density measurement)  Completed   HPV Vaccine  Aged Out   Colon Cancer Screening  Discontinued   Hepatitis C Screening: USPSTF Recommendation to screen - Ages 70-79 yo.  Discontinued  *Topic was postponed. The date shown is not the original due date.    Advanced directives: End of life planning; Advance aging; Advanced directives discussed.  Copy of current HCPOA/Living Will requested.    Conditions/risks identified: none new  Follow up in one year for your annual wellness visit    Preventive Care 65 Years and Older, Female Preventive care refers to lifestyle choices and visits with your health care provider that can promote health and wellness. What does preventive care include? A yearly physical exam. This is also called an annual well check. Dental exams once or twice a year. Routine eye exams. Ask your health care provider how often you should have your eyes checked. Personal lifestyle choices, including: Daily care of your teeth and gums. Regular physical activity. Eating a healthy diet. Avoiding tobacco and drug use. Limiting alcohol use. Practicing safe sex. Taking low-dose aspirin every day. Taking vitamin and mineral supplements as recommended by your health care provider. What happens  during an annual well check? The services and screenings done by your health care provider during your annual well check will depend on your age, overall health, lifestyle risk factors, and family history of disease. Counseling  Your health care provider may ask you questions about your: Alcohol use. Tobacco use. Drug use. Emotional well-being. Home and relationship well-being. Sexual activity. Eating habits. History of falls. Memory and ability to understand (cognition). Work and work Statistician. Reproductive health. Screening  You may have the following tests or measurements: Height, weight, and BMI. Blood pressure. Lipid and cholesterol levels. These may be checked every 5 years, or more frequently if you are over 67 years old. Skin check. Lung cancer screening. You may have this screening every year starting at age 12 if you have a 30-pack-year history of smoking and currently smoke or have quit within the past 15 years. Fecal occult blood test (FOBT) of the stool. You may have this test every year starting at age 14. Flexible sigmoidoscopy or colonoscopy. You may have a sigmoidoscopy every 5 years or a colonoscopy every 10 years starting at age 12. Hepatitis C blood test. Hepatitis B blood test. Sexually transmitted disease (STD) testing. Diabetes screening. This is done by checking your blood sugar (glucose) after you have not eaten for a while (fasting). You may have this done every 1-3 years. Bone density scan. This is done to screen for osteoporosis. You may have this done starting at age 2. Mammogram. This may  be done every 1-2 years. Talk to your health care provider about how often you should have regular mammograms. Talk with your health care provider about your test results, treatment options, and if necessary, the need for more tests. Vaccines  Your health care provider may recommend certain vaccines, such as: Influenza vaccine. This is recommended every  year. Tetanus, diphtheria, and acellular pertussis (Tdap, Td) vaccine. You may need a Td booster every 10 years. Zoster vaccine. You may need this after age 66. Pneumococcal 13-valent conjugate (PCV13) vaccine. One dose is recommended after age 28. Pneumococcal polysaccharide (PPSV23) vaccine. One dose is recommended after age 74. Talk to your health care provider about which screenings and vaccines you need and how often you need them. This information is not intended to replace advice given to you by your health care provider. Make sure you discuss any questions you have with your health care provider. Document Released: 02/22/2015 Document Revised: 10/16/2015 Document Reviewed: 11/27/2014 Elsevier Interactive Patient Education  2017 [place] Prevention in the Home Falls can cause injuries. They can happen to people of all ages. There are many things you can do to make your home safe and to help prevent falls. What can I do on the outside of my home? Regularly fix the edges of walkways and driveways and fix any cracks. Remove anything that might make you trip as you walk through a door, such as a raised step or threshold. Trim any bushes or trees on the path to your home. Use bright outdoor lighting. Clear any walking paths of anything that might make someone trip, such as rocks or tools. Regularly check to see if handrails are loose or broken. Make sure that both sides of any steps have handrails. Any raised decks and porches should have guardrails on the edges. Have any leaves, snow, or ice cleared regularly. Use sand or salt on walking paths during winter. Clean up any spills in your garage right away. This includes oil or grease spills. What can I do in the bathroom? Use night lights. Install grab bars by the toilet and in the tub and shower. Do not use towel bars as grab bars. Use non-skid mats or decals in the tub or shower. If you need to sit down in the shower, use a  plastic, non-slip stool. Keep the floor dry. Clean up any water that spills on the floor as soon as it happens. Remove soap buildup in the tub or shower regularly. Attach bath mats securely with double-sided non-slip rug tape. Do not have throw rugs and other things on the floor that can make you trip. What can I do in the bedroom? Use night lights. Make sure that you have a light by your bed that is easy to reach. Do not use any sheets or blankets that are too big for your bed. They should not hang down onto the floor. Have a firm chair that has side arms. You can use this for support while you get dressed. Do not have throw rugs and other things on the floor that can make you trip. What can I do in the kitchen? Clean up any spills right away. Avoid walking on wet floors. Keep items that you use a lot in easy-to-reach places. If you need to reach something above you, use a strong step stool that has a grab bar. Keep electrical cords out of the way. Do not use floor polish or wax that makes floors slippery. If you must use  wax, use non-skid floor wax. Do not have throw rugs and other things on the floor that can make you trip. What can I do with my stairs? Do not leave any items on the stairs. Make sure that there are handrails on both sides of the stairs and use them. Fix handrails that are broken or loose. Make sure that handrails are as long as the stairways. Check any carpeting to make sure that it is firmly attached to the stairs. Fix any carpet that is loose or worn. Avoid having throw rugs at the top or bottom of the stairs. If you do have throw rugs, attach them to the floor with carpet tape. Make sure that you have a light switch at the top of the stairs and the bottom of the stairs. If you do not have them, ask someone to add them for you. What else can I do to help prevent falls? Wear shoes that: Do not have high heels. Have rubber bottoms. Are comfortable and fit you  well. Are closed at the toe. Do not wear sandals. If you use a stepladder: Make sure that it is fully opened. Do not climb a closed stepladder. Make sure that both sides of the stepladder are locked into place. Ask someone to hold it for you, if possible. Clearly mark and make sure that you can see: Any grab bars or handrails. First and last steps. Where the edge of each step is. Use tools that help you move around (mobility aids) if they are needed. These include: Canes. Walkers. Scooters. Crutches. Turn on the lights when you go into a dark area. Replace any light bulbs as soon as they burn out. Set up your furniture so you have a clear path. Avoid moving your furniture around. If any of your floors are uneven, fix them. If there are any pets around you, be aware of where they are. Review your medicines with your doctor. Some medicines can make you feel dizzy. This can increase your chance of falling. Ask your doctor what other things that you can do to help prevent falls. This information is not intended to replace advice given to you by your health care provider. Make sure you discuss any questions you have with your health care provider. Document Released: 11/22/2008 Document Revised: 07/04/2015 Document Reviewed: 03/02/2014 Elsevier Interactive Patient Education  2017 [employer].

## 2021-02-21 NOTE — Progress Notes (Addendum)
Subjective:   Chloe Perkins is a 76 y.o. female who presents for Medicare Annual (Subsequent) preventive examination.  Review of Systems    No ROS.  Medicare Wellness Virtual Visit.  Visual/audio telehealth visit, UTA vital signs.   See social history for additional risk factors.   Cardiac Risk Factors include: advanced age (>52men, >51 women);hypertension     Objective:    Today's Vitals   02/21/21 0923  Weight: 110 lb (49.9 kg)  Height: 5\' 4"  (1.626 m)   Body mass index is 18.88 kg/m.  Advanced Directives 02/21/2021 02/21/2020 10/03/2018 03/23/2016  Does Patient Have a Medical Advance Directive? Yes Yes No No  Type of Paramedic of [place];Living will [place];Living will - -  Does patient want to make changes to medical advance directive? No - Patient declined No - Patient declined - Yes (MAU/Ambulatory/Procedural Areas - Information given)  Copy of [place] in Chart? No - copy requested No - copy requested - -  Would patient like information on creating a medical advance directive? - - Yes (ED - Information included in AVS) No - Patient declined    Current Medications (verified) Outpatient Encounter Medications as of 02/21/2021  Medication Sig   amLODipine (NORVASC) 5 MG tablet TAKE 1 TABLET(5 MG) BY MOUTH DAILY   amoxicillin (AMOXIL) 500 MG capsule Take 1 capsule (500 mg total) by mouth 3 (three) times daily.   buPROPion (WELLBUTRIN XL) 150 MG 24 hr tablet Take 450 mg by mouth daily.   calcium-vitamin D (OSCAL WITH D) 500-200 MG-UNIT per tablet Take 1 tablet by mouth 2 (two) times daily.   cyanocobalamin (,VITAMIN B-12,) 1000 MCG/ML injection Inject 1,000 mcg into the muscle every 30 (thirty) days.   LORazepam (ATIVAN) 0.5 MG tablet Take 0.5 mg by mouth See admin instructions. TAKE 1 TABLET (0.5 MG) SCHEDULED IN THE MORNING & 1 TABLET (0.5 MG) IN THE EVENING AS NEEDED FOR ANXIETY.   losartan (COZAAR) 100 MG  tablet Take 1 tablet (100 mg total) by mouth daily.   No facility-administered encounter medications on file as of 02/21/2021.    Allergies (verified) Codeine, Macrobid [nitrofurantoin], Metoprolol, Adhesive [tape], Alendronate sodium, and Verapamil   History: Past Medical History:  Diagnosis Date   Anxiety and depression    Arthritis    Asthmatic bronchitis    Atypical nevus 01/27/2011   right side abdomen-severe atypia   Atypical nevus 11/09/2012   moderate atypia-left upper back   Atypical nevus 11/19/2012   moderate atypia-left forearm   BCC (basal cell carcinoma) 10/23/2002   left bridge nose-Bcc with sclerosis   Bronchitis    history of   Cancer ([place])    MELANOMA   Depression    Dysrhythmia    Heart murmur    Hemorrhoids    History of melanoma    HLD (hyperlipidemia)    Hypertension    Melanoma ([place]) 10/18/1997   right post sholder-melanoma level 1   Mitral valve prolapse    Osteoporosis    Personal history of malignant melanoma of skin    Superficial basal cell carcinoma (BCC) 01/10/2013   right cheek   SVT (supraventricular tachycardia) ([facility])    SVT (supraventricular tachycardia) ([place]) 05/29/2013   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   Viral gastroenteritis    Past Surgical History:  Procedure Laterality Date   ABDOMINAL HYSTERECTOMY  1975  CATARACT EXTRACTION W/PHACO Right 02/10/2017   Procedure: CATARACT EXTRACTION PHACO AND INTRAOCULAR LENS PLACEMENT (IOC);  Surgeon: Birder Robson, MD;  Location: [facility] ORS;  Service: Ophthalmology;  Laterality: Right;  [place] 00:35.9 AP% 11.5 CDE 4.11 Fluid Pack Lot # [id number]   CATARACT EXTRACTION W/PHACO Left 03/09/2017   Procedure: CATARACT EXTRACTION PHACO AND INTRAOCULAR LENS PLACEMENT (IOC);  Surgeon: Birder Robson, MD;  Location: [facility] ORS;  Service: Ophthalmology;  Laterality: Left;  [place] 00:23 AP% 16.3 CDE  3.85 Fluid pack lot # [id number]   COLONOSCOPY  04/15/2004   2006: Normal   ENDOMETRIAL ABLATION  1976   left thumb tendon repair  10/09   MELANOMA EXCISION  1999   superficial spreading melanoma right post shoulder   NM MYOCAR PERF WALL MOTION  12/28/2008   No ischemia   SKIN CANCER EXCISION  03/02/2013   superficial basal cell cancer   US ECHOCARDIOGRAPHY  10/16/2010   mitral valve leaflets midly thickened,trace MR.   WRIST SURGERY Left 2011   Family History  Problem Relation Age of Onset   Rheum arthritis Mother    Stroke Mother    Heart disease Mother        Father, brother, sister   Lung cancer Father    Emphysema Father        PGF, brother, sister   COPD Father        PGF, brother, sister   Heart disease Father    Heart disease Sister    Alzheimer's disease Sister    Heart disease Brother    Ovarian cancer Paternal Aunt    Lung cancer Paternal Grandfather    Breast cancer Neg Hx    Social History   Socioeconomic History   Marital status: Married    Spouse name: sammy Shaver   Number of children: 1   Years of education: Not on file   Highest education level: Not on file  Occupational History   Occupation: Retired  Tobacco Use   Smoking status: Former    Packs/day: 0.50    Years: 20.00    Pack years: 10.00    Types: Cigarettes    Quit date: 02/09/1990    Years since quitting: 31.0   Smokeless tobacco: Never  Vaping Use   Vaping Use: Never used  Substance and Sexual Activity   Alcohol use: No    Alcohol/week: 0.0 standard drinks   Drug use: No   Sexual activity: Yes  Other Topics Concern   Not on file  Social History Narrative   She lives her family, she has one child, retired, no smoke no drink   Social Determinants of Radio broadcast assistant Strain: Low Risk    Difficulty of Paying Living Expenses: Not hard at all  Food Insecurity: No Food Insecurity   Worried About Charity fundraiser in the Last Year: Never true   Arboriculturist in the Last  Year: Never true  Transportation Needs: No Transportation Needs   Lack of Transportation (Medical): No   Lack of Transportation (Non-Medical): No  Physical Activity: Not on file  Stress: No Stress Concern Present   Feeling of Stress : Not at all  Social Connections: Unknown   Frequency of Communication with Friends and Family: Not on file   Frequency of Social Gatherings with Friends and Family: Not on file   Attends Religious Services: Not on file   Active Member of Clubs or Organizations: Not on file   Attends Archivist  Meetings: Not on file   Marital Status: Married    Tobacco Counseling Counseling given: Not Answered   Clinical Intake:  Pre-visit preparation completed: Yes        Diabetes: No            Activities of Daily Living In your present state of health, do you have any difficulty performing the following activities: 02/21/2021  Hearing? N  Vision? N  Difficulty concentrating or making decisions? N  Walking or climbing stairs? N  Dressing or bathing? N  Doing errands, shopping? N  Preparing Food and eating ? N  Using the Toilet? N  In the past six months, have you accidently leaked urine? N  Do you have problems with loss of bowel control? N  Managing your Medications? N  Managing your Finances? N  Housekeeping or managing your Housekeeping? N  Some recent data might be hidden    Patient Care Team: [name], MD as PCP - General (Internal Medicine) Troy Sine, MD as PCP - Cardiology (Cardiology) Gatha Mayer, MD as Consulting Physician (Gastroenterology) Beverly Gust, MD (Unknown Physician Specialty)  Indicate any recent Medical Services you may have received from other than [facility] providers in the past year (date may be approximate).     Assessment:   This is a routine wellness examination for [place].  Virtual Visit via Telephone Note  I connected with  Chloe Perkins on 02/21/21 at  9:00 AM EST by  telephone and verified that I am speaking with the correct person using two identifiers.  Persons participating in the virtual visit: patient/Nurse Health Advisor   I discussed the limitations, risks, security and privacy concerns of performing an evaluation and management service by telephone and the availability of in person appointments. The patient expressed understanding and agreed to proceed.  Interactive audio and video telecommunications were attempted between this nurse and patient, however failed, due to patient having technical difficulties OR patient did not have access to video capability.  We continued and completed visit with audio only.  Some vital signs may be absent or patient reported.   Hearing/Vision screen Hearing Screening - Comments:: Patient is able to hear conversational tones without difficulty.  No issues reported. Vision Screening - Comments:: Follow[facility]  Wears corrective lenses  Bilateral cataracts, extracted  They have seen their ophthalmologist in the last 12 months.   Dietary issues and exercise activities discussed:   Regular diet   Goals Addressed             This Visit's Progress    Increase physical activity       Walk for exercise       Depression Screen PHQ 2/9 Scores 02/21/2021 03/20/2020 02/21/2020 03/23/2016 03/18/2015  PHQ - 2 Score 0 0 0 0 0    Fall Risk Fall Risk  02/21/2021 03/20/2020 02/21/2020 02/24/2019 12/24/2017  Falls in the past year? 0 0 0 0 0  Comment - - - - Emmi Telephone Survey: data to providers prior to load  Number falls in past yr: 0 0 0 - -  Injury with Fall? 0 0 0 - -  Follow up Falls evaluation completed Falls evaluation completed Falls evaluation completed Falls evaluation completed -    FALL RISK PREVENTION PERTAINING TO THE HOME: Home free of loose throw rugs in walkways, pet beds, electrical cords, etc? Yes  Adequate lighting in your home to reduce risk of falls? Yes   ASSISTIVE DEVICES  UTILIZED TO PREVENT FALLS: Life alert? No  Use of a cane, walker or w/c? No   TIMED UP AND GO: Was the test performed? No .   Cognitive Function: Patient is alert and oriented x3.  MMSE - Mini Mental State Exam 03/23/2016  Orientation to time 5  Orientation to Place 5  Registration 3  Attention/ Calculation 5  Recall 3  Language- name 2 objects 2  Language- repeat 1  Language- follow 3 step command 3  Language- read & follow direction 1  Write a sentence 1  Copy design 1  Total score 30     6CIT Screen 02/21/2021  What Year? 0 points  What month? 0 points  What time? 0 points  Months in reverse 0 points    Immunizations Immunization History  Administered Date(s) Administered   Fluad Quad(high Dose 65+) 11/24/2018   Influenza Split 11/27/2010, 11/05/2011, 12/09/2012, 11/09/2013   Influenza Whole 02/09/2009   Influenza, High Dose Seasonal PF 11/11/2015, 12/27/2017   Influenza-Unspecified 12/08/2014   PFIZER(Purple Top)SARS-COV-2 Vaccination 03/13/2019, 04/03/2019, 12/11/2019   Pneumococcal Conjugate-13 03/18/2015   Pneumococcal Polysaccharide-23 12/10/2011   Tdap 07/09/2015   Flu Vaccine status: Up to date. Per patient, received in office last visit with B12 vaccine.  Shingrix Completed?: No.    Education has been provided regarding the importance of this vaccine. Patient has been advised to call insurance company to determine out of pocket expense if they have not yet received this vaccine. Advised may also receive vaccine at local pharmacy or Health Dept. Verbalized acceptance and understanding.  Screening Tests Health Maintenance  Topic Date Due   COVID-19 Vaccine (4 - Booster for Pfizer series) 03/09/2021 (Originally 02/05/2020)   INFLUENZA VACCINE  05/09/2021 (Originally 09/09/2020)   Zoster Vaccines- Shingrix (1 of 2) 05/22/2021 (Originally 06/05/1964)   MAMMOGRAM  07/02/2021   TETANUS/TDAP  07/08/2025   Pneumonia Vaccine 66+ Years old  Completed   DEXA SCAN   Completed   HPV VACCINES  Aged Out   COLONOSCOPY (Pts 45-108yrs Insurance coverage will need to be confirmed)  Discontinued   Hepatitis C Screening  Discontinued   Health Maintenance There are no preventive care reminders to display for this patient.  Lung Cancer Screening: (Low Dose CT Chest recommended if Age 32-80 years, 30 pack-year currently smoking OR have quit w/in 15years.) does not qualify.   Vision Screening: Recommended annual ophthalmology exams for early detection of glaucoma and other disorders of the eye.  Dental Screening: Recommended annual dental exams for proper oral hygiene  Community Resource Referral / Chronic Care Management: CRR required this visit?  No   CCM required this visit?  No      Plan:   Keep all routine maintenance appointments.   I have personally reviewed and noted the following in the patients chart:   Medical and social history Use of alcohol, tobacco or illicit drugs  Current medications and supplements including opioid prescriptions.  Functional ability and status Nutritional status Physical activity Advanced directives List of other physicians Hospitalizations, surgeries, and ER visits in previous 12 months Vitals Screenings to include cognitive, depression, and falls Referrals and appointments  In addition, I have reviewed and discussed with patient certain preventive protocols, quality metrics, and best practice recommendations. A written personalized care plan for preventive services as well as general preventive health recommendations were provided to patient.     OBrien-Blaney, Cavon Nicolls L, LPN   1/93/7902     I have reviewed the above information and agree with  above.   Deborra Medina, MD

## 2021-03-10 ENCOUNTER — Ambulatory Visit (INDEPENDENT_AMBULATORY_CARE_PROVIDER_SITE_OTHER): Payer: Medicare Other | Admitting: *Deleted

## 2021-03-10 ENCOUNTER — Other Ambulatory Visit: Payer: Self-pay

## 2021-03-10 DIAGNOSIS — E538 Deficiency of other specified B group vitamins: Secondary | ICD-10-CM

## 2021-03-10 MED ORDER — CYANOCOBALAMIN 1000 MCG/ML IJ SOLN
1000.0000 ug | Freq: Once | INTRAMUSCULAR | Status: AC
  Administered 2021-03-10: 1000 ug via INTRAMUSCULAR

## 2021-03-10 NOTE — Progress Notes (Signed)
Patient presented for B 12 injection to left deltoid, patient voiced no concerns nor showed any signs of distress during injection. 

## 2021-03-11 ENCOUNTER — Ambulatory Visit: Payer: Medicare Other

## 2021-03-12 ENCOUNTER — Ambulatory Visit: Payer: Medicare Other

## 2021-04-11 ENCOUNTER — Other Ambulatory Visit: Payer: Self-pay

## 2021-04-11 ENCOUNTER — Ambulatory Visit (INDEPENDENT_AMBULATORY_CARE_PROVIDER_SITE_OTHER): Payer: Medicare Other | Admitting: *Deleted

## 2021-04-11 DIAGNOSIS — E538 Deficiency of other specified B group vitamins: Secondary | ICD-10-CM

## 2021-04-11 MED ORDER — CYANOCOBALAMIN 1000 MCG/ML IJ SOLN
1000.0000 ug | Freq: Once | INTRAMUSCULAR | Status: AC
  Administered 2021-04-11: 1000 ug via INTRAMUSCULAR

## 2021-04-11 NOTE — Progress Notes (Signed)
Pt arrived for B12 injection, given in L deltoid. Pt tolerated injection well, showed no signs of distress nor voiced any concerns.  ?

## 2021-05-08 ENCOUNTER — Ambulatory Visit (INDEPENDENT_AMBULATORY_CARE_PROVIDER_SITE_OTHER): Payer: Medicare Other | Admitting: Dermatology

## 2021-05-08 DIAGNOSIS — L821 Other seborrheic keratosis: Secondary | ICD-10-CM

## 2021-05-08 DIAGNOSIS — L820 Inflamed seborrheic keratosis: Secondary | ICD-10-CM

## 2021-05-08 DIAGNOSIS — Z86018 Personal history of other benign neoplasm: Secondary | ICD-10-CM

## 2021-05-08 DIAGNOSIS — D229 Melanocytic nevi, unspecified: Secondary | ICD-10-CM

## 2021-05-08 DIAGNOSIS — Z872 Personal history of diseases of the skin and subcutaneous tissue: Secondary | ICD-10-CM

## 2021-05-08 DIAGNOSIS — L814 Other melanin hyperpigmentation: Secondary | ICD-10-CM

## 2021-05-08 DIAGNOSIS — D1800 Hemangioma unspecified site: Secondary | ICD-10-CM

## 2021-05-08 DIAGNOSIS — Z85820 Personal history of malignant melanoma of skin: Secondary | ICD-10-CM

## 2021-05-08 DIAGNOSIS — Z85828 Personal history of other malignant neoplasm of skin: Secondary | ICD-10-CM

## 2021-05-08 DIAGNOSIS — Z1283 Encounter for screening for malignant neoplasm of skin: Secondary | ICD-10-CM

## 2021-05-08 DIAGNOSIS — L570 Actinic keratosis: Secondary | ICD-10-CM | POA: Diagnosis not present

## 2021-05-08 DIAGNOSIS — L578 Other skin changes due to chronic exposure to nonionizing radiation: Secondary | ICD-10-CM

## 2021-05-08 NOTE — Progress Notes (Signed)
? ?Follow-Up Visit ?  ?Subjective  ?Chloe Perkins is a 76 y.o. female who presents for the following: Follow-up (Patient reports crusty spot on right side of nose, spot at left side of face, spots at lower back, some stops at arms. Patient has history of aks, isks, melanoma, bcc, and dysplastic nevus at multiples sites. ). ?The patient presents for Total-Body Skin Exam (TBSE) for skin cancer screening and mole check.  The patient has spots, moles and lesions to be evaluated, some may be new or changing and the patient has concerns that these could be cancer. ? ?The following portions of the chart were reviewed this encounter and updated as appropriate:  Tobacco  Allergies  Meds  Problems  Med Hx  Surg Hx  Fam Hx   ?  ?Review of Systems: No other skin or systemic complaints except as noted in HPI or Assessment and Plan. ? ?Objective  ?Well appearing patient in no apparent distress; mood and affect are within normal limits. ? ?A full examination was performed including scalp, head, eyes, ears, nose, lips, neck, chest, axillae, abdomen, back, buttocks, bilateral upper extremities, bilateral lower extremities, hands, feet, fingers, toes, fingernails, and toenails. All findings within normal limits unless otherwise noted below. ? ?right nose x 1, right upper lip x 1 (2) ?Erythematous thin papules/macules with gritty scale.  ? ?left cheek x 3 , sacral area x1, right wrist x 1 (5) ?Erythematous stuck-on, waxy papule or plaque ? ? ?Assessment & Plan  ?Actinic keratosis (2) ?right nose x 1, right upper lip x 1 ?Actinic keratoses are precancerous spots that appear secondary to cumulative UV radiation exposure/sun exposure over time. They are chronic with expected duration over 1 year. A portion of actinic keratoses will progress to squamous cell carcinoma of the skin. It is not possible to reliably predict which spots will progress to skin cancer and so treatment is recommended to prevent development of skin  cancer. ? ?Recommend daily broad spectrum sunscreen SPF 30+ to sun-exposed areas, reapply every 2 hours as needed.  ?Recommend staying in the shade or wearing long sleeves, sun glasses (UVA+UVB protection) and wide brim hats (4-inch brim around the entire circumference of the hat). ?Call for new or changing lesions. ? ?Destruction of lesion - right nose x 1, right upper lip x 1 ?Complexity: simple   ?Destruction method: cryotherapy   ?Informed consent: discussed and consent obtained   ?Timeout:  patient name, date of birth, surgical site, and procedure verified ?Lesion destroyed using liquid nitrogen: Yes   ?Region frozen until ice ball extended beyond lesion: Yes   ?Outcome: patient tolerated procedure well with no complications   ?Post-procedure details: wound care instructions given   ?Additional details:  Prior to procedure, discussed risks of blister formation, small wound, skin dyspigmentation, or rare scar following cryotherapy. Recommend Vaseline ointment to treated areas while healing. ? ?Inflamed seborrheic keratosis (5) ?left cheek x 3 , sacral area x1, right wrist x 1 ?Irritated and bothers patient  ?Destruction of lesion - left cheek x 3 , sacral area x1, right wrist x 1 ?Complexity: simple   ?Destruction method: cryotherapy   ?Informed consent: discussed and consent obtained   ?Timeout:  patient name, date of birth, surgical site, and procedure verified ?Lesion destroyed using liquid nitrogen: Yes   ?Region frozen until ice ball extended beyond lesion: Yes   ?Outcome: patient tolerated procedure well with no complications   ?Post-procedure details: wound care instructions given   ?Additional details:  Prior  to procedure, discussed risks of blister formation, small wound, skin dyspigmentation, or rare scar following cryotherapy. Recommend Vaseline ointment to treated areas while healing. ? ?Lentigines ?- Scattered tan macules ?- Due to sun exposure ?- Benign-appearing, observe ?- Recommend daily broad  spectrum sunscreen SPF 30+ to sun-exposed areas, reapply every 2 hours as needed. ?- Call for any changes ? ?Seborrheic Keratoses ?- Stuck-on, waxy, tan-brown papules and/or plaques  arms  ?- Benign-appearing ?- Discussed benign etiology and prognosis. ?- Observe ?- Call for any changes ? ?Melanocytic Nevi ?- Tan-brown and/or pink-flesh-colored symmetric macules and papules ?- Benign appearing on exam today ?- Observation ?- Call clinic for new or changing moles ?- Recommend daily use of broad spectrum spf 30+ sunscreen to sun-exposed areas.  ? ?Varicose Veins/Spider Veins ?- Dilated blue, purple or red veins at the lower extremities ?- Reassured ?- Smaller vessels can be treated by sclerotherapy (a procedure to inject a medicine into the veins to make them disappear) if desired, but the treatment is not covered by insurance. Larger vessels may be covered if symptomatic and we would refer to vascular surgeon if treatment desired. ? ?Hemangiomas ?- Red papules ?- Discussed benign nature ?- Observe ?- Call for any changes ? ?Actinic Damage ?- Chronic condition, secondary to cumulative UV/sun exposure ?- diffuse scaly erythematous macules with underlying dyspigmentation ?- Recommend daily broad spectrum sunscreen SPF 30+ to sun-exposed areas, reapply every 2 hours as needed.  ?- Staying in the shade or wearing long sleeves, sun glasses (UVA+UVB protection) and wide brim hats (4-inch brim around the entire circumference of the hat) are also recommended for sun protection.  ?- Call for new or changing lesions. ? ?History of Dysplastic Nevi ?- No evidence of recurrence today multiple sites see history  ?- Recommend regular full body skin exams ?- Recommend daily broad spectrum sunscreen SPF 30+ to sun-exposed areas, reapply every 2 hours as needed.  ?- Call if any new or changing lesions are noted between office visits ? ?History of Melanoma ?- No evidence of recurrence today at right posterior shoulder 99 ?- No  lymphadenopathy ?- Recommend regular full body skin exams ?- Recommend daily broad spectrum sunscreen SPF 30+ to sun-exposed areas, reapply every 2 hours as needed.  ?- Call if any new or changing lesions are noted between office visits ? ?History of Basal Cell Carcinoma of the Skin ?- No evidence of recurrence today left bridge of nose ?- Recommend regular full body skin exams ?- Recommend daily broad spectrum sunscreen SPF 30+ to sun-exposed areas, reapply every 2 hours as needed.  ?- Call if any new or changing lesions are noted between office visits ? ?Skin cancer screening performed today. ?Return for 1 year tbse . ?Garry Heater, CMA, am acting as scribe for Sarina Ser, MD. ?Documentation: I have reviewed the above documentation for accuracy and completeness, and I agree with the above. ? ?Sarina Ser, MD ? ?

## 2021-05-08 NOTE — Patient Instructions (Addendum)
Seborrheic Keratosis ? ?What causes seborrheic keratoses? ?Seborrheic keratoses are harmless, common skin growths that first appear during adult life.  As time goes by, more growths appear.  Some people may develop a large number of them.  Seborrheic keratoses appear on both covered and uncovered body parts.  They are not caused by sunlight.  The tendency to develop seborrheic keratoses can be inherited.  They vary in color from skin-colored to gray, brown, or even black.  They can be either smooth or have a rough, warty surface.   ?Seborrheic keratoses are superficial and look as if they were stuck on the skin.  Under the microscope this type of keratosis looks like layers upon layers of skin.  That is why at times the top layer may seem to fall off, but the rest of the growth remains and re-grows.   ? ?Treatment ?Seborrheic keratoses do not need to be treated, but can easily be removed in the office.  Seborrheic keratoses often cause symptoms when they rub on clothing or jewelry.  Lesions can be in the way of shaving.  If they become inflamed, they can cause itching, soreness, or burning.  Removal of a seborrheic keratosis can be accomplished by freezing, burning, or surgery. ?If any spot bleeds, scabs, or grows rapidly, please return to have it checked, as these can be an indication of a skin cancer. ? ?Actinic keratoses are precancerous spots that appear secondary to cumulative UV radiation exposure/sun exposure over time. They are chronic with expected duration over 1 year. A portion of actinic keratoses will progress to squamous cell carcinoma of the skin. It is not possible to reliably predict which spots will progress to skin cancer and so treatment is recommended to prevent development of skin cancer. ? ?Recommend daily broad spectrum sunscreen SPF 30+ to sun-exposed areas, reapply every 2 hours as needed.  ?Recommend staying in the shade or wearing long sleeves, sun glasses (UVA+UVB protection) and wide  brim hats (4-inch brim around the entire circumference of the hat). ?Call for new or changing lesions.  ? ?Cryotherapy Aftercare ? ?Wash gently with soap and water everyday.   ?Apply Vaseline and Band-Aid daily until healed.  ? ? ? ? ? ? ? ? ? ?Melanoma ABCDEs ? ?Melanoma is the most dangerous type of skin cancer, and is the leading cause of death from skin disease.  You are more likely to develop melanoma if you: ?Have light-colored skin, light-colored eyes, or red or blond hair ?Spend a lot of time in the sun ?Tan regularly, either outdoors or in a tanning bed ?Have had blistering sunburns, especially during childhood ?Have a close family member who has had a melanoma ?Have atypical moles or large birthmarks ? ?Early detection of melanoma is key since treatment is typically straightforward and cure rates are extremely high if we catch it early.  ? ?The first sign of melanoma is often a change in a mole or a new dark spot.  The ABCDE system is a way of remembering the signs of melanoma. ? ?A for asymmetry:  The two halves do not match. ?B for border:  The edges of the growth are irregular. ?C for color:  A mixture of colors are present instead of an even brown color. ?D for diameter:  Melanomas are usually (but not always) greater than 71m - the size of a pencil eraser. ?E for evolution:  The spot keeps changing in size, shape, and color. ? ?Please check your skin once per month  between visits. You can use a small mirror in front and a large mirror behind you to keep an eye on the back side or your body.  ? ?If you see any new or changing lesions before your next follow-up, please call to schedule a visit. ? ?Please continue daily skin protection including broad spectrum sunscreen SPF 30+ to sun-exposed areas, reapplying every 2 hours as needed when you're outdoors.  ? ?Staying in the shade or wearing long sleeves, sun glasses (UVA+UVB protection) and wide brim hats (4-inch brim around the entire circumference of  the hat) are also recommended for sun protection.   ? ? ?If You Need Anything After Your Visit ? ?If you have any questions or concerns for your doctor, please call our main line at [phone] and press option 4 to reach your doctor's medical assistant. If no one answers, please leave a voicemail as directed and we will return your call as soon as possible. Messages left after 4 pm will be answered the following business day.  ? ?You may also send [place] a message via MyChart. We typically respond to MyChart messages within 1-2 business days. ? ?For prescription refills, please ask your pharmacy to contact our office. Our fax number is [phone]. ? ?If you have an urgent issue when the clinic is closed that cannot wait until the next business day, you can page your doctor at the number below.   ? ?Please note that while we do our best to be available for urgent issues outside of office hours, we are not available 24/7.  ? ?If you have an urgent issue and are unable to reach [place], you may choose to seek medical care at your doctor's office, retail clinic, urgent care center, or emergency room. ? ?If you have a medical emergency, please immediately call 911 or go to the emergency department. ? ?Pager Numbers ? ?- Dr. Nehemiah Massed: [phone] ? ?- Dr. Laurence Ferrari: [phone] ? ?- Dr. Nicole Kindred: [phone] ? ?In the event of inclement weather, please call our main line at [phone] for an update on the status of any delays or closures. ? ?Dermatology Medication Tips: ?Please keep the boxes that topical medications come in in order to help keep track of the instructions about where and how to use these. Pharmacies typically print the medication instructions only on the boxes and not directly on the medication tubes.  ? ?If your medication is too expensive, please contact our office at [phone] option 4 or send [place] a message through [place].  ? ?We are unable to tell what your co-pay for medications will be in advance as  this is different depending on your insurance coverage. However, we may be able to find a substitute medication at lower cost or fill out paperwork to get insurance to cover a needed medication.  ? ?If a prior authorization is required to get your medication covered by your insurance company, please allow [place] 1-2 business days to complete this process. ? ?Drug prices often vary depending on where the prescription is filled and some pharmacies may offer cheaper prices. ? ?The website www.goodrx.com contains coupons for medications through different pharmacies. The prices here do not account for what the cost may be with help from insurance (it may be cheaper with your insurance), but the website can give you the price if you did not use any insurance.  ?- You can print the associated coupon and take it with your prescription to the pharmacy.  ?- You may also stop  by our office during regular business hours and pick up a GoodRx coupon card.  ?- If you need your prescription sent electronically to a different pharmacy, notify our office through [facility] or by phone at [phone] option 4. ? ? ? ? ?Si Usted Necesita Algo Despu?s de Su Visita ? ?Tambi?n puede enviarnos un mensaje a trav?s de MyChart. Por lo general respondemos a los mensajes de MyChart en el transcurso de 1 a 2 d?as h?biles. ? ?Para renovar recetas, por favor pida a su farmacia que se ponga en contacto con nuestra oficina. Nuestro n?mero de fax es el [phone]. ? ?Si tiene un asunto urgente cuando la cl?nica est? cerrada y que no puede esperar hasta el siguiente d?a h?bil, puede llamar/localizar a su doctor(a) al n?mero que aparece a continuaci?n.  ? ?Por favor, tenga en cuenta que aunque hacemos todo lo posible para estar disponibles para asuntos urgentes fuera del horario de oficina, no estamos disponibles las 24 horas del d?a, los 7 d?as de la semana.  ? ?Si tiene un problema urgente y no puede comunicarse con nosotros, puede optar por  buscar atenci?n m?dica  en el consultorio de su doctor(a), en una cl?nica privada, en un centro de atenci?n urgente o en una sala de emergencias. ? ?Si tiene una emergencia m?dica, por favor llame inmediat

## 2021-05-09 ENCOUNTER — Encounter: Payer: Self-pay | Admitting: Dermatology

## 2021-05-13 ENCOUNTER — Ambulatory Visit (INDEPENDENT_AMBULATORY_CARE_PROVIDER_SITE_OTHER): Payer: Medicare Other

## 2021-05-13 DIAGNOSIS — E538 Deficiency of other specified B group vitamins: Secondary | ICD-10-CM

## 2021-05-13 MED ORDER — CYANOCOBALAMIN 1000 MCG/ML IJ SOLN
1000.0000 ug | Freq: Once | INTRAMUSCULAR | Status: AC
  Administered 2021-05-13: 1000 ug via INTRAMUSCULAR

## 2021-05-13 NOTE — Progress Notes (Signed)
Patient presented for B 12 injection to right deltoid, patient voiced no concerns nor showed any signs of distress during injection. 

## 2021-06-17 ENCOUNTER — Ambulatory Visit (INDEPENDENT_AMBULATORY_CARE_PROVIDER_SITE_OTHER): Payer: Medicare Other

## 2021-06-17 DIAGNOSIS — E538 Deficiency of other specified B group vitamins: Secondary | ICD-10-CM | POA: Diagnosis not present

## 2021-06-17 MED ORDER — CYANOCOBALAMIN 1000 MCG/ML IJ SOLN
1000.0000 ug | Freq: Once | INTRAMUSCULAR | Status: AC
  Administered 2021-06-17: 1000 ug via INTRAMUSCULAR

## 2021-06-17 NOTE — Progress Notes (Signed)
Patient presented for B 12 injection to left deltoid, patient voiced no concerns nor showed any signs of distress during injection. 

## 2021-06-18 ENCOUNTER — Ambulatory Visit: Payer: Medicare Other | Admitting: Dermatology

## 2021-07-22 ENCOUNTER — Ambulatory Visit: Payer: Medicare Other

## 2021-07-28 ENCOUNTER — Ambulatory Visit (INDEPENDENT_AMBULATORY_CARE_PROVIDER_SITE_OTHER): Payer: Medicare Other

## 2021-07-28 DIAGNOSIS — E538 Deficiency of other specified B group vitamins: Secondary | ICD-10-CM | POA: Diagnosis not present

## 2021-07-28 MED ORDER — CYANOCOBALAMIN 1000 MCG/ML IJ SOLN
1000.0000 ug | Freq: Once | INTRAMUSCULAR | Status: AC
  Administered 2021-07-28: 1000 ug via INTRAMUSCULAR

## 2021-07-28 NOTE — Progress Notes (Signed)
Patient presented for B 12 injection to left deltoid, patient voiced no concerns nor showed any signs of distress during injection. 

## 2021-08-27 ENCOUNTER — Ambulatory Visit: Payer: Medicare Other

## 2021-09-13 ENCOUNTER — Encounter (HOSPITAL_COMMUNITY): Payer: Self-pay | Admitting: Emergency Medicine

## 2021-09-13 ENCOUNTER — Other Ambulatory Visit: Payer: Self-pay

## 2021-09-13 ENCOUNTER — Emergency Department (HOSPITAL_COMMUNITY): Payer: Medicare Other

## 2021-09-13 ENCOUNTER — Emergency Department (HOSPITAL_COMMUNITY)
Admission: EM | Admit: 2021-09-13 | Discharge: 2021-09-13 | Disposition: A | Discharge status: Home / Self Care | Payer: Medicare Other | Attending: Emergency Medicine | Admitting: Emergency Medicine

## 2021-09-13 DIAGNOSIS — Z79899 Other long term (current) drug therapy: Secondary | ICD-10-CM | POA: Insufficient documentation

## 2021-09-13 DIAGNOSIS — I10 Essential (primary) hypertension: Secondary | ICD-10-CM | POA: Insufficient documentation

## 2021-09-13 DIAGNOSIS — R002 Palpitations: Secondary | ICD-10-CM | POA: Diagnosis present

## 2021-09-13 DIAGNOSIS — F419 Anxiety disorder, unspecified: Secondary | ICD-10-CM | POA: Insufficient documentation

## 2021-09-13 DIAGNOSIS — R0789 Other chest pain: Secondary | ICD-10-CM | POA: Diagnosis not present

## 2021-09-13 LAB — CBC
HCT: 43.4 % (ref 36.0–46.0)
Hemoglobin: 14.8 g/dL (ref 12.0–15.0)
MCH: 32.7 pg (ref 26.0–34.0)
MCHC: 34.1 g/dL (ref 30.0–36.0)
MCV: 96 fL (ref 80.0–100.0)
Platelets: 209 10*3/uL (ref 150–400)
RBC: 4.52 MIL/uL (ref 3.87–5.11)
RDW: 11.9 % (ref 11.5–15.5)
WBC: 4.6 10*3/uL (ref 4.0–10.5)
nRBC: 0 % (ref 0.0–0.2)

## 2021-09-13 LAB — BASIC METABOLIC PANEL
Anion gap: 7 (ref 5–15)
BUN: 17 mg/dL (ref 8–23)
CO2: 25 mmol/L (ref 22–32)
Calcium: 9.9 mg/dL (ref 8.9–10.3)
Chloride: 106 mmol/L (ref 98–111)
Creatinine, Ser: 1 mg/dL (ref 0.44–1.00)
GFR, Estimated: 58 mL/min — ABNORMAL LOW (ref >60)
Glucose, Bld: 113 mg/dL — ABNORMAL HIGH (ref 70–99)
Potassium: 4.8 mmol/L (ref 3.5–5.1)
Sodium: 138 mmol/L (ref 135–145)

## 2021-09-13 LAB — TROPONIN I (HIGH SENSITIVITY)
Troponin I (High Sensitivity): 4 ng/L (ref <18)
Troponin I (High Sensitivity): 3 ng/L (ref <18)

## 2021-09-13 NOTE — ED Provider Notes (Signed)
[facility] EMERGENCY DEPARTMENT Provider Note   CSN: [id number] Arrival date & time: 09/13/21  1845     History  Chief Complaint  Patient presents with   Chest Pain    JESSICAH CROLL is a 76 y.o. female here presenting with chest pain.  Patient has a history of hypertension.  Patient also has a history of SVT.  Patient saw cardiology previously for palpitations.  Patient states that she has been having some palpitations today.  Also some chest pressure patient had seen cardiology previously for this.  Cardiology tried beta-blockers and diltiazem but she then became bradycardic.  Patient is currently on amlodipine and losartan.  Patient also had Holter monitors previously  The history is provided by the patient.       Home Medications Prior to Admission medications   Medication Sig Start Date End Date Taking? Authorizing Provider  amLODipine (NORVASC) 5 MG tablet TAKE 1 TABLET(5 MG) BY MOUTH DAILY 12/11/20   Troy Sine, MD  amoxicillin (AMOXIL) 500 MG capsule Take 1 capsule (500 mg total) by mouth 3 (three) times daily. 02/14/21   Gae Dry, MD  buPROPion (WELLBUTRIN XL) 150 MG 24 hr tablet Take 450 mg by mouth daily.    [provider]  calcium-vitamin D (OSCAL WITH D) 500-200 MG-UNIT per tablet Take 1 tablet by mouth 2 (two) times daily.    [provider]  cyanocobalamin (,VITAMIN B-12,) 1000 MCG/ML injection Inject 1,000 mcg into the muscle every 30 (thirty) days.    [provider]  LORazepam (ATIVAN) 0.5 MG tablet Take 0.5 mg by mouth See admin instructions. TAKE 1 TABLET (0.5 MG) SCHEDULED IN THE MORNING & 1 TABLET (0.5 MG) IN THE EVENING AS NEEDED FOR ANXIETY. 01/20/17   [provider]  losartan (COZAAR) 100 MG tablet Take 1 tablet (100 mg total) by mouth daily. 12/11/20   Troy Sine, MD      Allergies    Codeine, Macrobid [nitrofurantoin], Metoprolol, Adhesive [tape], Alendronate sodium, and Verapamil     Review of Systems   Review of Systems  Cardiovascular:  Positive for chest pain and palpitations.  All other systems reviewed and are negative.   Physical Exam Updated Vital Signs BP (!) 147/71   Pulse 75   Temp 98.3 F (36.8 C) (Oral)   Resp 17   SpO2 97%  Physical Exam Vitals and nursing note reviewed.  Constitutional:      Appearance: She is well-developed.     Comments: Slightly anxious  HENT:     Head: Normocephalic.  Eyes:     Extraocular Movements: Extraocular movements intact.     Pupils: Pupils are equal, round, and reactive to light.  Cardiovascular:     Rate and Rhythm: Normal rate and regular rhythm.     Heart sounds: Normal heart sounds.  Pulmonary:     Effort: Pulmonary effort is normal.     Breath sounds: Normal breath sounds.  Abdominal:     General: Bowel sounds are normal.     Palpations: Abdomen is soft.  Musculoskeletal:        General: Normal range of motion.     Cervical back: Normal range of motion and neck supple.  Skin:    General: Skin is warm.     Capillary Refill: Capillary refill takes less than 2 seconds.  Neurological:     General: No focal deficit present.     Mental Status: She is alert and oriented to  person, place, and time.  Psychiatric:        Mood and Affect: Mood normal.        Behavior: Behavior normal.     ED Results / Procedures / Treatments   Labs (all labs ordered are listed, but only abnormal results are displayed) Labs Reviewed  BASIC METABOLIC PANEL - Abnormal; Notable for the following components:      Result Value   Glucose, Bld 113 (*)    GFR, Estimated 58 (*)    All other components within normal limits  CBC  TROPONIN I (HIGH SENSITIVITY)  TROPONIN I (HIGH SENSITIVITY)    EKG EKG Interpretation  Date/Time:  Saturday September 13 2021 20:46:41 EDT Ventricular Rate:  71 PR Interval:  147 QRS Duration: 93 QT Interval:  413 QTC Calculation: 449 R Axis:   26 Text Interpretation: Sinus rhythm Low  voltage, extremity and precordial leads RSR' in V1 or V2, right VCD or RVH No significant change since last tracing Confirmed by Wandra Arthurs [phone]) on 09/13/2021 10:30:24 PM  Radiology DG Chest 2 View  Result Date: 09/13/2021 CLINICAL DATA:  Chest pain. EXAM: CHEST - 2 VIEW COMPARISON:  03/18/2015. FINDINGS: The heart size and mediastinal contours are within normal limits. There is atherosclerotic calcification of the aorta. There is hyperinflation of the lungs with increased AP diameter of the chest suggesting chronic obstructive pulmonary disease. No consolidation, effusion, or pneumothorax. The previously described nodular density is not seen on this exam. No acute osseous abnormality. IMPRESSION: 1. No active cardiopulmonary disease. 2. Chronic obstructive pulmonary disease. Electronically Signed   By: Brett Fairy M.D.   On: 09/13/2021 20:13    Procedures Procedures    Medications Ordered in ED Medications - No data to display  ED Course/ Medical Decision Making/ A&P                           Medical Decision Making BAILYN SPACKMAN is a 76 y.o. female here presenting with chest pain and palpitations.  This is an ongoing issue.  Patient has seen by cardiology in the past.  Patient had bradycardia with diltiazem and also with metoprolol so is only on Norvasc and losartan.  I do not think patient has a PE.  Consider electrolyte abnormality versus ACS given chest pain.  Plan to get CBC and BMP and troponin x2 and chest x-ray  11:11 PM Troponin negative x2 and BMP is unremarkable.  At this point, patient can be discharged.  I told her that she can take an extra dose of Norvasc if she had palpitations.  She can follow-up with Dr. Claiborne Billings outpatient.  Problems Addressed: Palpitations: acute illness or injury  Amount and/or Complexity of Data Reviewed Labs: ordered. Decision-making details documented in ED Course. Radiology: ordered and independent interpretation performed. Decision-making  details documented in ED Course. ECG/medicine tests: ordered.    Final Clinical Impression(s) / ED Diagnoses Final diagnoses:  None    Rx / DC Orders ED Discharge Orders     None         Drenda Freeze, MD 09/13/21 2312

## 2021-09-13 NOTE — ED Triage Notes (Signed)
Pt presents with "heart fluttering" today. Worse in the last hour.  Pt states she is a little short of breath and feels "chilled"  No n/v/d.  Takes BP medications but denies hx of arrhythmia

## 2021-09-13 NOTE — Discharge Instructions (Signed)
Your medical work-up was reassuring.  As we discussed, if you have palpitations you can take an extra dose of Norvasc  Please follow-up with Dr. Claiborne Billings  Return to ER if you have worse palpitations, chest pain, trouble breathing

## 2021-10-14 ENCOUNTER — Ambulatory Visit (INDEPENDENT_AMBULATORY_CARE_PROVIDER_SITE_OTHER): Payer: Medicare Other

## 2021-10-14 DIAGNOSIS — E538 Deficiency of other specified B group vitamins: Secondary | ICD-10-CM | POA: Diagnosis not present

## 2021-10-14 MED ORDER — CYANOCOBALAMIN 1000 MCG/ML IJ SOLN
1000.0000 ug | Freq: Once | INTRAMUSCULAR | Status: AC
  Administered 2021-10-14: 1000 ug via INTRAMUSCULAR

## 2021-10-14 NOTE — Progress Notes (Signed)
Pt arrived for B12 injection, given in L deltoid. Pt tolerated injection well, showed no signs of distress nor voiced any concerns.  ?

## 2021-11-13 ENCOUNTER — Ambulatory Visit (INDEPENDENT_AMBULATORY_CARE_PROVIDER_SITE_OTHER): Payer: Medicare Other

## 2021-11-13 DIAGNOSIS — E538 Deficiency of other specified B group vitamins: Secondary | ICD-10-CM

## 2021-11-13 MED ORDER — CYANOCOBALAMIN 1000 MCG/ML IJ SOLN
1000.0000 ug | Freq: Once | INTRAMUSCULAR | Status: AC
  Administered 2021-11-13: 1000 ug via INTRAMUSCULAR

## 2021-11-13 NOTE — Progress Notes (Signed)
Patient presented for B 12 injection to left deltoid, patient voiced no concerns nor showed any signs of distress during injection. 

## 2021-12-01 ENCOUNTER — Ambulatory Visit (INDEPENDENT_AMBULATORY_CARE_PROVIDER_SITE_OTHER): Payer: Medicare Other | Admitting: Surgery

## 2021-12-01 ENCOUNTER — Ambulatory Visit: Payer: Self-pay

## 2021-12-01 ENCOUNTER — Encounter: Payer: Self-pay | Admitting: Surgery

## 2021-12-01 VITALS — BP 134/78 | HR 71 | Ht 64.0 in | Wt 110.0 lb

## 2021-12-01 DIAGNOSIS — M25572 Pain in left ankle and joints of left foot: Secondary | ICD-10-CM

## 2021-12-01 DIAGNOSIS — M79672 Pain in left foot: Secondary | ICD-10-CM | POA: Diagnosis not present

## 2021-12-01 DIAGNOSIS — M5442 Lumbago with sciatica, left side: Secondary | ICD-10-CM | POA: Diagnosis not present

## 2021-12-01 NOTE — Progress Notes (Signed)
Office Visit Note   Patient: Chloe Perkins           Date of Birth: May 09, 1945           MRN: [id number] Visit Date: 12/01/2021              Requested by: [name], MD [address] [place],  [place] [address] PCP: [name], MD   Assessment & Plan: Visit Diagnoses:  1. Acute left-sided low back pain with left-sided sciatica   2. Left foot pain   3. Pain in left ankle and joints of left foot   Left ankle synovitis  Plan: For patient's left ankle pain offered intra-articular Marcaine/Depo-Medrol injection but she declined.  She like to give this more time and see how she does.  She will take over-the-counter oral NSAID to see if this helps to give some relief.  Follow-up with me in 3 weeks for recheck and she will let me know if she would like to try the ankle injection.  I do not think further work-up of her lumbar spine is indicated at this point since the ankle pain is the bigger issue.  Follow-Up Instructions: Return in about 3 weeks (around 12/22/2021) for with Alondra Vandeven recheck left ankle.   Orders:  Orders Placed This Encounter  Procedures   XR Lumbar Spine 2-3 Views   XR Foot Complete Left   XR Ankle Complete Left   No orders of the defined types were placed in this encounter.     Procedures: No procedures performed   Clinical Data: No additional findings.   Subjective: Chief Complaint  Patient presents with   Left Foot - Pain    HPI 76 year old white female comes in with complaints of left ankle pain and intermittent left leg pain.  Patient does have known history of lumbar spondylosis and was followed by Dr. Junius Roads for this previously.  Main problem is with left ankle pain.  This is been ongoing for about 2 weeks.  No injury.  Localizes pain to the anterolateral joint line when she is ambulating.  No swelling.  No bruising.  She describes having intermittent pain from the buttock down the left leg.  Infrequent episodes of this.  No  complaints of leg weakness. Review of Systems No current complaints of cardiopulmonary GI/GU issues  Objective: Vital Signs: BP 134/78   Pulse 71   Ht '5\' 4"'$  (1.626 m)   Wt 110 lb (49.9 kg)   BMI 18.88 kg/m   Physical Exam Constitutional:      Appearance: Normal appearance.  HENT:     Head: Normocephalic.     Nose: Nose normal.  Eyes:     Extraocular Movements: Extraocular movements intact.  Pulmonary:     Effort: No respiratory distress.  Musculoskeletal:     Comments: Gait is somewhat antalgic.  Negative log roll bilateral hips.  Left ankle no swelling.  No bruising.  Good ankle range of motion.  Ankle ligaments are stable.  Achilles tendon nontender.  Plantar fascia calcaneal insertion nontender.  She has moderate tenderness across the anterior tibiotalar joint line.  Neurological:     Mental Status: She is alert and oriented to person, place, and time.  Psychiatric:        Mood and Affect: Mood normal.     Ortho Exam  Specialty Comments:  No specialty comments available.  Imaging: No results found.   PMFS History: Patient Active Problem List   Diagnosis Date  Noted   Colon cancer screening 03/23/2020   PAC (premature atrial contraction) 02/08/2019   Urinary hesitancy 10/25/2017   CAD (coronary artery disease), native coronary artery 07/06/2017   Spondylolysis, cervical region 05/19/2016   Solitary pulmonary nodule 05/04/2015   B12 deficiency 05/04/2015   Encounter for preventive health examination 03/19/2015   S/P hysterectomy 06/26/2013   CKD (chronic kidney disease) stage 3, GFR 30-59 ml/min (HCC) 06/26/2013   Subclinical hypothyroidism 06/26/2013   Mitral valve prolapse    HLD (hyperlipidemia)    Anxiety and depression 09/03/2010   IBS (irritable bowel syndrome) 09/03/2010   Essential hypertension 02/06/2008   Osteoporosis 02/06/2008   PERSONAL HISTORY OF MALIGNANT MELANOMA OF SKIN 02/06/2008   Past Medical History:  Diagnosis Date   Anxiety and  depression    Arthritis    Asthmatic bronchitis    Atypical nevus 01/27/2011   right side abdomen-severe atypia   Atypical nevus 11/09/2012   moderate atypia-left upper back   Atypical nevus 11/19/2012   moderate atypia-left forearm   BCC (basal cell carcinoma) 10/23/2002   left bridge nose-Bcc with sclerosis   Bronchitis    history of   Cancer ([facility])    MELANOMA   Depression    Dysrhythmia    Heart murmur    Hemorrhoids    History of melanoma    HLD (hyperlipidemia)    Hypertension    Melanoma ([facility]) 10/18/1997   right post sholder-melanoma level 1   Mitral valve prolapse    Osteoporosis    Personal history of malignant melanoma of skin    Superficial basal cell carcinoma (BCC) 01/10/2013   right cheek   SVT (supraventricular tachycardia)    SVT (supraventricular tachycardia) 05/29/2013   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   Viral gastroenteritis     Family History  Problem Relation Age of Onset   Rheum arthritis Mother    Stroke Mother    Heart disease Mother        Father, brother, sister   Lung cancer Father    Emphysema Father        PGF, brother, sister   COPD Father        PGF, brother, sister   Heart disease Father    Heart disease Sister    Alzheimer's disease Sister    Heart disease Brother    Ovarian cancer Paternal Aunt    Lung cancer Paternal Grandfather    Breast cancer Neg Hx     Past Surgical History:  Procedure Laterality Date   ABDOMINAL HYSTERECTOMY  1975   CATARACT EXTRACTION W/PHACO Right 02/10/2017   Procedure: CATARACT EXTRACTION PHACO AND INTRAOCULAR LENS PLACEMENT ([place]);  Surgeon: Birder Robson, MD;  Location: [facility] ORS;  Service: Ophthalmology;  Laterality: Right;  [place] 00:35.9 AP% 11.5 CDE 4.11 Fluid Pack Lot # [id number]   CATARACT EXTRACTION W/PHACO Left 03/09/2017   Procedure: CATARACT EXTRACTION PHACO AND INTRAOCULAR LENS  PLACEMENT ([facility]);  Surgeon: Birder Robson, MD;  Location: [facility] ORS;  Service: Ophthalmology;  Laterality: Left;  [place] 00:23 AP% 16.3 CDE 3.85 Fluid pack lot # [id number]   COLONOSCOPY  04/15/2004   2006: Normal   ENDOMETRIAL ABLATION  1976   left thumb tendon repair  10/09   MELANOMA EXCISION  1999   superficial spreading melanoma right post shoulder   NM MYOCAR PERF WALL MOTION  12/28/2008   No ischemia   SKIN CANCER EXCISION  03/02/2013   superficial basal cell cancer   US ECHOCARDIOGRAPHY  10/16/2010   mitral valve leaflets midly thickened,trace MR.   WRIST SURGERY Left 2011   Social History   Occupational History   Occupation: Retired  Tobacco Use   Smoking status: Former    Packs/day: 0.50    Years: 20.00    Total pack years: 10.00    Types: Cigarettes    Quit date: 02/09/1990    Years since quitting: 31.8   Smokeless tobacco: Never  Vaping Use   Vaping Use: Never used  Substance and Sexual Activity   Alcohol use: No    Alcohol/week: 0.0 standard drinks of alcohol   Drug use: No   Sexual activity: Yes

## 2021-12-05 NOTE — Progress Notes (Unsigned)
Cardiology Clinic Note   Patient Name: Chloe Perkins Date of Encounter: 12/08/2021  Primary Care Provider:  [name], MD Primary Cardiologist:  Shelva Majestic, MD  Patient Profile    Chloe Perkins 76 year old female presents the clinic today for annual follow-up of her coronary artery disease and essential hypertension.  Past Medical History    Past Medical History:  Diagnosis Date   Anxiety and depression    Arthritis    Asthmatic bronchitis    Atypical nevus 01/27/2011   right side abdomen-severe atypia   Atypical nevus 11/09/2012   moderate atypia-left upper back   Atypical nevus 11/19/2012   moderate atypia-left forearm   BCC (basal cell carcinoma) 10/23/2002   left bridge nose-Bcc with sclerosis   Bronchitis    history of   Cancer ([place])    MELANOMA   Depression    Dysrhythmia    Heart murmur    Hemorrhoids    History of melanoma    HLD (hyperlipidemia)    Hypertension    Melanoma ([place]) 10/18/1997   right post sholder-melanoma level 1   Mitral valve prolapse    Osteoporosis    Personal history of malignant melanoma of skin    Superficial basal cell carcinoma (BCC) 01/10/2013   right cheek   SVT (supraventricular tachycardia)    SVT (supraventricular tachycardia) 05/29/2013   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   Viral gastroenteritis    Past Surgical History:  Procedure Laterality Date   ABDOMINAL HYSTERECTOMY  1975   CATARACT EXTRACTION W/PHACO Right 02/10/2017   Procedure: CATARACT EXTRACTION PHACO AND INTRAOCULAR LENS PLACEMENT ([place]);  Surgeon: Birder Robson, MD;  Location: [facility] ORS;  Service: Ophthalmology;  Laterality: Right;  [place] 00:35.9 AP% 11.5 CDE 4.11 Fluid Pack Lot # [id number]   CATARACT EXTRACTION W/PHACO Left 03/09/2017   Procedure: CATARACT EXTRACTION PHACO AND INTRAOCULAR LENS PLACEMENT (IOC);  Surgeon: Birder Robson,  MD;  Location: [facility] ORS;  Service: Ophthalmology;  Laterality: Left;  [place] 00:23 AP% 16.3 CDE 3.85 Fluid pack lot # [id number]   COLONOSCOPY  04/15/2004   2006: Normal   ENDOMETRIAL ABLATION  1976   left thumb tendon repair  10/09   MELANOMA EXCISION  1999   superficial spreading melanoma right post shoulder   NM MYOCAR PERF WALL MOTION  12/28/2008   No ischemia   SKIN CANCER EXCISION  03/02/2013   superficial basal cell cancer   US ECHOCARDIOGRAPHY  10/16/2010   mitral valve leaflets midly thickened,trace MR.   WRIST SURGERY Left 2011    Allergies  Allergies  Allergen Reactions   Codeine Nausea And Vomiting   Macrobid [Nitrofurantoin]     Upsets stomach.   Metoprolol     Severe bradycardia    Adhesive [Tape] Rash    PAPER TAPE ONLY (INTOLERANCE TO LEADS--EKG)   Alendronate Sodium Other (See Comments)    REACTION: pt states INTOL to Fosamax   Verapamil Other (See Comments)    REACTION: Intol w/ bradycardia    History of Present Illness    Chloe Perkins is a PMH of HTN, mitral valve prolapse, coronary artery disease, premature atrial contraction, IBS, CKD stage III, anxiety, depression, hyperlipidemia, B12 deficiency, and is status post hysterectomy.  Echocardiogram 7/21 showed an EF of 60 to 00%, normal diastolic parameters, normal PA pressures.  Carotid artery plaque which was identified via dental x-ray.  She underwent carotid  duplex 7/17 which showed 1-39% bilateral ICA stenosis and normal subclavian arteries.  When she was seen in follow-up by Dr. Claiborne Billings 10/21 she continued to be stable from a cardiac standpoint.  She reported she felt well.  She was not exercising due to spine issues.  She was receiving spinal injections which did help somewhat.  She denied recurrent palpitations.  She was seen in follow-up by Dr. Claiborne Billings on 12/11/2020.  During that time she remained stable from a cardiac standpoint.  Her blood pressure remained well controlled.  She was continued on amlodipine,  losartan.  She was taking Bupropion daily for depression and as needed lorazepam.  She was seen Dr.Tullo for primary care.  She presents to the clinic today for follow-up evaluation and states she feels well.  We reviewed her last cardiology visit.  We reviewed her previous echocardiogram and carotid Doppler study.  She expressed understanding.  She reports that she is unable to gain weight.  We discussed in creasing calories in her diet.  She does not have a formal exercise regimen.  We reviewed the importance of heart health and increase physical activity.  She expressed understanding.  She reports compliance with her medications and denies side effects.  I will refill her amlodipine and her losartan for 1 year.  Today she denies chest pain, shortness of breath, lower extremity edema, fatigue, palpitations, melena, hematuria, hemoptysis, diaphoresis, weakness, presyncope, syncope, orthopnea, and PND.      Home Medications    Prior to Admission medications   Medication Sig Start Date End Date Taking? Authorizing Provider  amLODipine (NORVASC) 5 MG tablet TAKE 1 TABLET(5 MG) BY MOUTH DAILY 12/11/20   Troy Sine, MD  amoxicillin (AMOXIL) 500 MG capsule Take 1 capsule (500 mg total) by mouth 3 (three) times daily. 02/14/21   Gae Dry, MD  buPROPion (WELLBUTRIN XL) 150 MG 24 hr tablet Take 450 mg by mouth daily.    [provider]  calcium-vitamin D (OSCAL WITH D) 500-200 MG-UNIT per tablet Take 1 tablet by mouth 2 (two) times daily.    [provider]  cyanocobalamin (,VITAMIN B-12,) 1000 MCG/ML injection Inject 1,000 mcg into the muscle every 30 (thirty) days.    [provider]  LORazepam (ATIVAN) 0.5 MG tablet Take 0.5 mg by mouth See admin instructions. TAKE 1 TABLET (0.5 MG) SCHEDULED IN THE MORNING & 1 TABLET (0.5 MG) IN THE EVENING AS NEEDED FOR ANXIETY. 01/20/17   [provider]  losartan (COZAAR) 100 MG tablet Take 1 tablet (100 mg total) by  mouth daily. 12/11/20   Troy Sine, MD    Family History    Family History  Problem Relation Age of Onset   Rheum arthritis Mother    Stroke Mother    Heart disease Mother        Father, brother, sister   Lung cancer Father    Emphysema Father        PGF, brother, sister   COPD Father        PGF, brother, sister   Heart disease Father    Heart disease Sister    Alzheimer's disease Sister    Heart disease Brother    Ovarian cancer Paternal Aunt    Lung cancer Paternal Grandfather    Breast cancer Neg Hx    She indicated that her mother is deceased. She indicated that her father is deceased. She indicated that the status of her sister is unknown. She indicated that the  status of her brother is unknown. She indicated that her maternal grandmother is deceased. She indicated that her maternal grandfather is deceased. She indicated that her paternal grandmother is deceased. She indicated that her paternal grandfather is deceased. She indicated that the status of her paternal aunt is unknown. She indicated that the status of her neg hx is unknown.  Social History    Social History   Socioeconomic History   Marital status: Married    Spouse name: sammy Suddreth   Number of children: 1   Years of education: Not on file   Highest education level: Not on file  Occupational History   Occupation: Retired  Tobacco Use   Smoking status: Former    Packs/day: 0.50    Years: 20.00    Total pack years: 10.00    Types: Cigarettes    Quit date: 02/09/1990    Years since quitting: 31.8   Smokeless tobacco: Never  Vaping Use   Vaping Use: Never used  Substance and Sexual Activity   Alcohol use: No    Alcohol/week: 0.0 standard drinks of alcohol   Drug use: No   Sexual activity: Yes  Other Topics Concern   Not on file  Social History Narrative   She lives her family, she has one child, retired, no smoke no drink   Social Determinants of Radio broadcast assistant Strain: Low  Risk  (02/21/2021)   Overall Financial Resource Strain (CARDIA)    Difficulty of Paying Living Expenses: Not hard at all  Food Insecurity: No Food Insecurity (02/21/2021)   Hunger Vital Sign    Worried About Running Out of Food in the Last Year: Never true    [place] in the Last Year: Never true  Transportation Needs: No Transportation Needs (02/21/2021)   PRAPARE - Hydrologist (Medical): No    Lack of Transportation (Non-Medical): No  Physical Activity: Not on file  Stress: No Stress Concern Present (02/21/2021)   [place]    Feeling of Stress : Not at all  Social Connections: Unknown (02/21/2021)   Social Connection and Isolation Panel [NHANES]    Frequency of Communication with Friends and Family: Not on file    Frequency of Social Gatherings with Friends and Family: Not on file    Attends Religious Services: Not on file    Active Member of Clubs or Organizations: Not on file    Attends Archivist Meetings: Not on file    Marital Status: Married  Intimate Partner Violence: Not At Risk (02/21/2021)   Humiliation, Afraid, Rape, and Kick questionnaire    Fear of Current or Ex-Partner: No    Emotionally Abused: No    Physically Abused: No    Sexually Abused: No     Review of Systems    General:  No chills, fever, night sweats or weight changes.  Cardiovascular:  No chest pain, dyspnea on exertion, edema, orthopnea, palpitations, paroxysmal nocturnal dyspnea. Dermatological: No rash, lesions/masses Respiratory: No cough, dyspnea Urologic: No hematuria, dysuria Abdominal:   No nausea, vomiting, diarrhea, bright red blood per rectum, melena, or hematemesis Neurologic:  No visual changes, wkns, changes in mental status. All other systems reviewed and are otherwise negative except as noted above.  Physical Exam    VS:  BP 126/60 (BP Location: Left Arm, Patient Position:  Sitting, Cuff Size: Normal)   Pulse 74   Ht '5\' 3"'$  (1.6  m)   Wt 105 lb (47.6 kg)   BMI 18.60 kg/m  , BMI Body mass index is 18.6 kg/m. GEN: Well nourished, well developed, in no acute distress. HEENT: normal. Neck: Supple, no JVD, carotid bruits, or masses. Cardiac: RRR, no murmurs, rubs, or gallops. No clubbing, cyanosis, edema.  Radials/DP/PT 2+ and equal bilaterally.  Respiratory:  Respirations regular and unlabored, clear to auscultation bilaterally. GI: Soft, nontender, nondistended, BS + x 4. MS: no deformity or atrophy. Skin: warm and dry, no rash. Neuro:  Strength and sensation are intact. Psych: Normal affect.  Accessory Clinical Findings    Recent Labs: 12/11/2020: ALT 13; TSH 4.730 09/13/2021: BUN 17; Creatinine, Ser 1.00; Hemoglobin 14.8; Platelets 209; Potassium 4.8; Sodium 138   Recent Lipid Panel    Component Value Date/Time   CHOL 206 (H) 12/11/2020 0823   CHOL 203 (H) 05/22/2011 0759   TRIG 52 12/11/2020 0823   TRIG 65 05/22/2011 0759   HDL 98 12/11/2020 0823   HDL 88 (H) 05/22/2011 0759   CHOLHDL 2.1 12/11/2020 0823   CHOLHDL 2 12/16/2017 1346   VLDL 16.6 12/16/2017 1346   VLDL 13 05/22/2011 0759   LDLCALC 98 12/11/2020 0823   LDLCALC 102 (H) 05/22/2011 0759   LDLDIRECT 102.2 04/23/2006 1211         ECG personally reviewed by me today-none today.   Echocardiogram 08/25/2019  IMPRESSIONS     1. Left ventricular ejection fraction, by estimation, is 60 to 65%. The  left ventricle has normal function. The left ventricle has no regional  wall motion abnormalities. Left ventricular diastolic parameters were  normal.   2. Right ventricular systolic function is normal. The right ventricular  size is normal. There is normal pulmonary artery systolic pressure.   3. The mitral valve is normal in structure. No evidence of mitral valve  regurgitation. No evidence of mitral stenosis.   4. The aortic valve is tricuspid. Aortic valve regurgitation is not   visualized. No aortic stenosis is present.   5. The inferior vena cava is dilated in size with >50% respiratory  variability, suggesting right atrial pressure of 8 mmHg.   Assessment & Plan   1.  Essential hypertension-BP today 126/60 Continue losartan, amlodipine Heart healthy low-sodium diet-salty 6 given Increase physical activity as tolerated   Hyperlipidemia-LDL 98 on 12/11/20 .  Statin intolerant, cause nausea. The low-sodium high-fiber diet Increase physical activity as tolerated Follows with PCP  SVT-denies recurrent palpitations.  Previously managed with diltiazem.  Noted to have bradycardia with metoprolol.  Presented to the emergency department on 09/13/2021.  Noted palpitations.  Has not had any further episodes. Avoid triggers caffeine, chocolate, EtOH, dehydration etc.  Coronary artery disease-noted on CT scan.  Denies recent episodes of arm neck back or chest discomfort. Continue amlodipine Heart healthy low-sodium high-fiber diet Increase physical activity as tolerated  Anxiety and depression-mood stable. Continue current medical therapy Follows with PCP  Disposition: Follow-up with Dr. Claiborne Billings or me in 9-12 months.   Jossie Ng. Carolynn Tuley NP-C     12/08/2021, 4:27 PM [place] [address] Office [phone] Fax [phone]  Notice: This dictation was prepared with Dragon dictation along with smaller phrase technology. Any transcriptional errors that result from this process are unintentional and may not be corrected upon review.  I spent 15 minutes examining this patient, reviewing medications, and using patient centered shared decision making involving her cardiac care.  Prior to her visit I spent greater  than 20 minutes reviewing her past medical history,  medications, and prior cardiac tests.

## 2021-12-08 ENCOUNTER — Ambulatory Visit: Payer: Medicare Other | Attending: General Practice | Admitting: General Practice

## 2021-12-08 ENCOUNTER — Encounter: Payer: Self-pay | Admitting: General Practice

## 2021-12-08 VITALS — BP 126/60 | HR 74 | Ht 63.0 in | Wt 105.0 lb

## 2021-12-08 DIAGNOSIS — I471 Supraventricular tachycardia, unspecified: Secondary | ICD-10-CM | POA: Diagnosis not present

## 2021-12-08 DIAGNOSIS — I2510 Atherosclerotic heart disease of native coronary artery without angina pectoris: Secondary | ICD-10-CM | POA: Diagnosis not present

## 2021-12-08 DIAGNOSIS — I10 Essential (primary) hypertension: Secondary | ICD-10-CM | POA: Diagnosis not present

## 2021-12-08 DIAGNOSIS — F32A Depression, unspecified: Secondary | ICD-10-CM | POA: Diagnosis present

## 2021-12-08 DIAGNOSIS — F419 Anxiety disorder, unspecified: Secondary | ICD-10-CM | POA: Diagnosis present

## 2021-12-08 DIAGNOSIS — E785 Hyperlipidemia, unspecified: Secondary | ICD-10-CM | POA: Insufficient documentation

## 2021-12-08 MED ORDER — LOSARTAN POTASSIUM 100 MG PO TABS: 100.0000 mg | ORAL_TABLET | Freq: Every day | ORAL | 3 refills | Status: DC

## 2021-12-08 MED ORDER — AMLODIPINE BESYLATE 5 MG PO TABS: ORAL_TABLET | ORAL | 3 refills | Status: DC

## 2021-12-08 NOTE — Patient Instructions (Signed)
Medication Instructions:  The current medical regimen is effective;  continue present plan and medications as directed. Please refer to the Current Medication list given to you today.  *If you need a refill on your cardiac medications before your next appointment, please call your pharmacy*  Lab Work: NONE If you have labs (blood work) drawn today and your tests are completely normal, you will receive your results only by: [place] (if you have MyChart) OR A paper copy in the mail If you have any lab test that is abnormal or we need to change your treatment, we will call you to review the results.  Testing/Procedures: NONE  Follow-Up: At [facility], you and your health needs are our priority.  As part of our continuing mission to provide you with exceptional heart care, we have created designated Provider Care Teams.  These Care Teams include your primary Cardiologist (physician) and Advanced Practice Providers (APPs -  Physician Assistants and Nurse Practitioners) who all work together to provide you with the care you need, when you need it.  We recommend signing up for the patient portal called "MyChart".  Sign up information is provided on this After Visit Summary.  MyChart is used to connect with patients for Virtual Visits (Telemedicine).  Patients are able to view lab/test results, encounter notes, upcoming appointments, etc.  Non-urgent messages can be sent to your provider as well.   To learn more about what you can do with MyChart, go to [email or web address].    Your next appointment:   12 month(s)  The format for your next appointment:   In Person  Provider:   Shelva Majestic, MD    Other Instructions INCREASE CALORIES IN YOUR DIET  Important Information About Sugar

## 2021-12-11 ENCOUNTER — Ambulatory Visit (INDEPENDENT_AMBULATORY_CARE_PROVIDER_SITE_OTHER): Payer: Medicare Other

## 2021-12-11 DIAGNOSIS — E538 Deficiency of other specified B group vitamins: Secondary | ICD-10-CM | POA: Diagnosis not present

## 2021-12-11 MED ORDER — CYANOCOBALAMIN 1000 MCG/ML IJ SOLN
1000.0000 ug | Freq: Once | INTRAMUSCULAR | Status: AC
  Administered 2021-12-11: 1000 ug via INTRAMUSCULAR

## 2021-12-11 NOTE — Progress Notes (Signed)
Chloe Perkins presents today for injection per MD orders. B12 injection administered IM in right Upper Arm. Administration without incident. Patient tolerated well.  Jahred Tatar,cma

## 2021-12-17 ENCOUNTER — Ambulatory Visit (INDEPENDENT_AMBULATORY_CARE_PROVIDER_SITE_OTHER): Payer: Medicare Other

## 2021-12-17 ENCOUNTER — Ambulatory Visit (INDEPENDENT_AMBULATORY_CARE_PROVIDER_SITE_OTHER): Payer: Medicare Other | Admitting: Podiatry

## 2021-12-17 ENCOUNTER — Encounter: Payer: Self-pay | Admitting: Podiatry

## 2021-12-17 DIAGNOSIS — M2550 Pain in unspecified joint: Secondary | ICD-10-CM | POA: Insufficient documentation

## 2021-12-17 DIAGNOSIS — M778 Other enthesopathies, not elsewhere classified: Secondary | ICD-10-CM

## 2021-12-17 DIAGNOSIS — F419 Anxiety disorder, unspecified: Secondary | ICD-10-CM | POA: Insufficient documentation

## 2021-12-17 DIAGNOSIS — C4360 Malignant melanoma of unspecified upper limb, including shoulder: Secondary | ICD-10-CM | POA: Insufficient documentation

## 2021-12-17 DIAGNOSIS — I2510 Atherosclerotic heart disease of native coronary artery without angina pectoris: Secondary | ICD-10-CM

## 2021-12-17 DIAGNOSIS — I499 Cardiac arrhythmia, unspecified: Secondary | ICD-10-CM | POA: Insufficient documentation

## 2021-12-17 DIAGNOSIS — R1032 Left lower quadrant pain: Secondary | ICD-10-CM | POA: Insufficient documentation

## 2021-12-17 DIAGNOSIS — C4359 Malignant melanoma of other part of trunk: Secondary | ICD-10-CM | POA: Insufficient documentation

## 2021-12-17 DIAGNOSIS — M545 Low back pain, unspecified: Secondary | ICD-10-CM | POA: Insufficient documentation

## 2021-12-17 DIAGNOSIS — M7752 Other enthesopathy of left foot: Secondary | ICD-10-CM

## 2021-12-17 MED ORDER — TRIAMCINOLONE ACETONIDE 40 MG/ML IJ SUSP
20.0000 mg | Freq: Once | INTRAMUSCULAR | Status: AC
  Administered 2021-12-17: 20 mg

## 2021-12-17 MED ORDER — METHYLPREDNISOLONE 4 MG PO TBPK: ORAL_TABLET | ORAL | 0 refills | Status: DC

## 2021-12-17 MED ORDER — MELOXICAM 15 MG PO TABS: 15.0000 mg | ORAL_TABLET | Freq: Every day | ORAL | 3 refills | Status: DC

## 2021-12-17 NOTE — Progress Notes (Signed)
Subjective:  Patient ID: Chloe Perkins, female    DOB: 22-Jun-1945,  MRN: [id number] HPI Chief Complaint  Patient presents with   Foot Pain    Medial/lateral foot/posterior heel left - aching x few weeks, no injury, swelling some, tried icing, unable to put full weight on it some days   New Patient (Initial Visit)    76 y.o. female presents with the above complaint.   ROS: Denies fever chills nausea vomit muscle aches pains calf pain back pain chest pain shortness of breath.  Past Medical History:  Diagnosis Date   Anxiety and depression    Arthritis    Asthmatic bronchitis    Atypical nevus 01/27/2011   right side abdomen-severe atypia   Atypical nevus 11/09/2012   moderate atypia-left upper back   Atypical nevus 11/19/2012   moderate atypia-left forearm   BCC (basal cell carcinoma) 10/23/2002   left bridge nose-Bcc with sclerosis   Bronchitis    history of   Cancer ([place])    MELANOMA   Depression    Dysrhythmia    Heart murmur    Hemorrhoids    History of melanoma    HLD (hyperlipidemia)    Hypertension    Melanoma ([place]) 10/18/1997   right post sholder-melanoma level 1   Mitral valve prolapse    Osteoporosis    Personal history of malignant melanoma of skin    Superficial basal cell carcinoma (BCC) 01/10/2013   right cheek   SVT (supraventricular tachycardia)    SVT (supraventricular tachycardia) 05/29/2013   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   Viral gastroenteritis    Past Surgical History:  Procedure Laterality Date   ABDOMINAL HYSTERECTOMY  1975   CATARACT EXTRACTION W/PHACO Right 02/10/2017   Procedure: CATARACT EXTRACTION PHACO AND INTRAOCULAR LENS PLACEMENT ([place]);  Surgeon: Birder Robson, MD;  Location: [facility] ORS;  Service: Ophthalmology;  Laterality: Right;  [place] 00:35.9 AP% 11.5 CDE 4.11 Fluid Pack Lot # [id number]   CATARACT EXTRACTION W/PHACO  Left 03/09/2017   Procedure: CATARACT EXTRACTION PHACO AND INTRAOCULAR LENS PLACEMENT (IOC);  Surgeon: Birder Robson, MD;  Location: [facility] ORS;  Service: Ophthalmology;  Laterality: Left;  [place] 00:23 AP% 16.3 CDE 3.85 Fluid pack lot # [id number]   COLONOSCOPY  04/15/2004   2006: Normal   ENDOMETRIAL ABLATION  1976   left thumb tendon repair  10/09   MELANOMA EXCISION  1999   superficial spreading melanoma right post shoulder   NM MYOCAR PERF WALL MOTION  12/28/2008   No ischemia   SKIN CANCER EXCISION  03/02/2013   superficial basal cell cancer   US ECHOCARDIOGRAPHY  10/16/2010   mitral valve leaflets midly thickened,trace MR.   WRIST SURGERY Left 2011    Current Outpatient Medications:    meloxicam (MOBIC) 15 MG tablet, Take 1 tablet (15 mg total) by mouth daily., Disp: 30 tablet, Rfl: 3   methylPREDNISolone (MEDROL DOSEPAK) 4 MG TBPK tablet, 6 day dose pack - take as directed, Disp: 21 tablet, Rfl: 0   amLODipine (NORVASC) 5 MG tablet, TAKE 1 TABLET(5 MG) BY MOUTH DAILY, Disp: 90 tablet, Rfl: 3   amoxicillin (AMOXIL) 500 MG capsule, Take 1 capsule (500 mg total) by mouth 3 (three) times daily., Disp: 21 capsule, Rfl: 0   buPROPion (WELLBUTRIN XL) 150 MG 24 hr tablet, Take 450 mg by mouth daily., Disp: , Rfl:    calcium-vitamin D (OSCAL WITH D) 500-200  MG-UNIT per tablet, Take 1 tablet by mouth 2 (two) times daily., Disp: , Rfl:    cyanocobalamin (,VITAMIN B-12,) 1000 MCG/ML injection, Inject 1,000 mcg into the muscle every 30 (thirty) days., Disp: , Rfl:    LORazepam (ATIVAN) 0.5 MG tablet, Take 0.5 mg by mouth See admin instructions. TAKE 1 TABLET (0.5 MG) SCHEDULED IN THE MORNING & 1 TABLET (0.5 MG) IN THE EVENING AS NEEDED FOR ANXIETY., Disp: , Rfl: 5   losartan (COZAAR) 100 MG tablet, Take 1 tablet (100 mg total) by mouth daily., Disp: 90 tablet, Rfl: 3  Allergies  Allergen Reactions   Codeine Nausea And Vomiting   Macrobid [Nitrofurantoin]     Upsets stomach.   Metoprolol      Severe bradycardia    Adhesive [Tape] Rash    PAPER TAPE ONLY (INTOLERANCE TO LEADS--EKG)   Alendronate Sodium Other (See Comments)    REACTION: pt states INTOL to Fosamax   Verapamil Other (See Comments)    REACTION: Intol w/ bradycardia   Review of Systems Objective:  There were no vitals filed for this visit.  General: Well developed, nourished, in no acute distress, alert and oriented x3   Dermatological: Skin is warm, dry and supple bilateral. Nails x 10 are well maintained; remaining integument appears unremarkable at this time. There are no open sores, no preulcerative lesions, no rash or signs of infection present.  Vascular: Dorsalis Pedis artery and Posterior Tibial artery pedal pulses are 2/4 bilateral with immedate capillary fill time. Pedal hair growth present. No varicosities and no lower extremity edema present bilateral.   Neruologic: Grossly intact via light touch bilateral. Vibratory intact via tuning fork bilateral. Protective threshold with Semmes Wienstein monofilament intact to all pedal sites bilateral. Patellar and Achilles deep tendon reflexes 2+ bilateral. No Babinski or clonus noted bilateral.   Musculoskeletal: No gross boney pedal deformities bilateral. No pain, crepitus, or limitation noted with foot and ankle range of motion bilateral. Muscular strength 5/5 in all groups tested bilateral.  Pain on palpation of the sinus tarsi and on end range of motion of the subtalar joint.  Ankle joint full range of motion without crepitation.  Gait: Unassisted, Nonantalgic.    Radiographs:  Radiographs taken today demonstrate osseously mature individual left foot.  Joint space narrowing is significant pretty much all of the joints no areas of spurring that are significant.  No fractures are identified.  Assessment & Plan:   Assessment: Early osteoarthritic changes subtalar joint capsulitis.  Plan: I injected the area today with Kenalog and local anesthetic 20 mg was  utilized.  Also started her on methylprednisolone to be followed by meloxicam.  Follow-up with her in 6 weeks if necessary.  Discussed appropriate shoe gear.     Cruz Bong T. [place], [place]

## 2021-12-29 ENCOUNTER — Telehealth: Payer: Self-pay | Admitting: *Deleted

## 2021-12-29 MED ORDER — MELOXICAM 7.5 MG PO TABS: 7.5000 mg | ORAL_TABLET | Freq: Every day | ORAL | 3 refills | Status: DC

## 2021-12-29 NOTE — Telephone Encounter (Signed)
Patient is calling to ask if she can be prescribed a lower dosage of the meloxicam,15 mg dosage  seems to be causing headaches, rise in blood pressure. She has discontinued until further notice.Please advise.

## 2021-12-29 NOTE — Telephone Encounter (Signed)
error 

## 2021-12-29 NOTE — Telephone Encounter (Signed)
I'll send in 7.5 mg to her pharmacy, just let her know

## 2022-01-13 ENCOUNTER — Ambulatory Visit (INDEPENDENT_AMBULATORY_CARE_PROVIDER_SITE_OTHER): Payer: Medicare Other

## 2022-01-13 DIAGNOSIS — E538 Deficiency of other specified B group vitamins: Secondary | ICD-10-CM

## 2022-01-13 MED ORDER — CYANOCOBALAMIN 1000 MCG/ML IJ SOLN
1000.0000 ug | Freq: Once | INTRAMUSCULAR | Status: AC
  Administered 2022-01-13: 1000 ug via INTRAMUSCULAR

## 2022-01-13 NOTE — Progress Notes (Signed)
Pt presented for their vitamin B12 injection. Pt was identified through two identifiers. Pt tolerated shot well in their left  deltoid.  

## 2022-01-28 ENCOUNTER — Ambulatory Visit: Payer: Medicare Other | Admitting: Podiatry

## 2022-02-12 ENCOUNTER — Ambulatory Visit (INDEPENDENT_AMBULATORY_CARE_PROVIDER_SITE_OTHER): Payer: Medicare Other

## 2022-02-12 DIAGNOSIS — E538 Deficiency of other specified B group vitamins: Secondary | ICD-10-CM

## 2022-02-12 MED ORDER — CYANOCOBALAMIN 1000 MCG/ML IJ SOLN: 1000.0000 ug | Freq: Once | INTRAMUSCULAR | 0 refills | Status: AC

## 2022-02-12 NOTE — Progress Notes (Signed)
Patient was administered a B12 injection in her Left Arm. Patient tolerated the injection well. Patient did not show signs of distress or voice any concerns.

## 2022-02-13 ENCOUNTER — Ambulatory Visit: Payer: Medicare Other

## 2022-02-16 ENCOUNTER — Ambulatory Visit: Payer: Medicare Other

## 2022-02-17 DIAGNOSIS — E538 Deficiency of other specified B group vitamins: Secondary | ICD-10-CM | POA: Diagnosis not present

## 2022-02-17 MED ORDER — CYANOCOBALAMIN 1000 MCG/ML IJ SOLN
1000.0000 ug | Freq: Once | INTRAMUSCULAR | Status: AC
  Administered 2022-02-17: 1000 ug via INTRAMUSCULAR

## 2022-02-17 NOTE — Addendum Note (Signed)
Addended by: Jeralyn Bennett A on: 02/17/2022 10:30 AM   Modules accepted: Orders

## 2022-02-19 ENCOUNTER — Ambulatory Visit: Payer: Medicare Other

## 2022-02-20 ENCOUNTER — Telehealth: Payer: Self-pay | Admitting: Internal Medicine

## 2022-02-20 NOTE — Telephone Encounter (Signed)
Spoke with patient she stated she and spouse will call back to sched AWV

## 2022-03-16 ENCOUNTER — Ambulatory Visit: Payer: Medicare Other

## 2022-04-29 ENCOUNTER — Telehealth: Payer: Self-pay | Admitting: Internal Medicine

## 2022-04-29 NOTE — Telephone Encounter (Signed)
Spoke with pt's husband to let him know that pt will need to call the office a schedule an appointment with Dr. Derrel Nip to discuss her concerns. Please schedule when pt calls back.

## 2022-04-29 NOTE — Telephone Encounter (Signed)
Pt called in staying that she feels her hair its falling off, and she was wondering if Dr. Derrel Nip can put an order of blood work for her? To check her thyroid, etc? Any questions, she's @[phone].

## 2022-04-30 ENCOUNTER — Ambulatory Visit (INDEPENDENT_AMBULATORY_CARE_PROVIDER_SITE_OTHER): Payer: Medicare Other | Admitting: Nurse Practitioner

## 2022-04-30 ENCOUNTER — Encounter: Payer: Self-pay | Admitting: Nurse Practitioner

## 2022-04-30 VITALS — BP 120/84 | HR 59 | Temp 97.9°F | Ht 63.0 in | Wt 106.0 lb

## 2022-04-30 DIAGNOSIS — E538 Deficiency of other specified B group vitamins: Secondary | ICD-10-CM | POA: Diagnosis not present

## 2022-04-30 DIAGNOSIS — L659 Nonscarring hair loss, unspecified: Secondary | ICD-10-CM

## 2022-04-30 LAB — TSH: TSH: 4.01 u[IU]/mL (ref 0.35–5.50)

## 2022-04-30 MED ORDER — CYANOCOBALAMIN 1000 MCG/ML IJ SOLN
1000.0000 ug | Freq: Once | INTRAMUSCULAR | Status: AC
  Administered 2022-04-30: 1000 ug via INTRAMUSCULAR

## 2022-04-30 NOTE — Progress Notes (Signed)
Established Patient Office Visit  Subjective:  Patient ID: Chloe Perkins, female    DOB: [birth year]  Age: 77 y.o. MRN: [id number]  CC:  Chief Complaint  Patient presents with   Acute Visit    Discuss hair loss & B12 injection    HPI  Chloe Perkins presents for hair loss concerns from the back of the head. She wanted to get her thyroid level checked due to hair loss.  Denies use of any new shampoos or conditioner but she does report using a different hair color brand.  Denies any chest pain, Sob, abdominal pain at present.    HPI   Past Medical History:  Diagnosis Date   Anxiety and depression    Arthritis    Asthmatic bronchitis    Atypical nevus 01/27/2011   right side abdomen-severe atypia   Atypical nevus 11/09/2012   moderate atypia-left upper back   Atypical nevus 11/19/2012   moderate atypia-left forearm   BCC (basal cell carcinoma) 10/23/2002   left bridge nose-Bcc with sclerosis   Bronchitis    history of   Cancer ([place])    MELANOMA   Depression    Dysrhythmia    Heart murmur    Hemorrhoids    History of melanoma    HLD (hyperlipidemia)    Hypertension    Melanoma ([facility]) 10/18/1997   right post sholder-melanoma level 1   Mitral valve prolapse    Osteoporosis    Personal history of malignant melanoma of skin    Superficial basal cell carcinoma (BCC) 01/10/2013   right cheek   SVT (supraventricular tachycardia)    SVT (supraventricular tachycardia) 05/29/2013   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   Viral gastroenteritis     Past Surgical History:  Procedure Laterality Date   ABDOMINAL HYSTERECTOMY  1975   CATARACT EXTRACTION W/PHACO Right 02/10/2017   Procedure: CATARACT EXTRACTION PHACO AND INTRAOCULAR LENS PLACEMENT ([place]);  Surgeon: Birder Robson, MD;  Location: [facility] ORS;  Service: Ophthalmology;  Laterality: Right;  [place] 00:35.9 AP%  11.5 CDE 4.11 Fluid Pack Lot # [id number]   CATARACT EXTRACTION W/PHACO Left 03/09/2017   Procedure: CATARACT EXTRACTION PHACO AND INTRAOCULAR LENS PLACEMENT ([facility]);  Surgeon: Birder Robson, MD;  Location: [facility] ORS;  Service: Ophthalmology;  Laterality: Left;  [place] 00:23 AP% 16.3 CDE 3.85 Fluid pack lot # [id number]   COLONOSCOPY  04/15/2004   2006: Normal   ENDOMETRIAL ABLATION  1976   left thumb tendon repair  10/09   MELANOMA EXCISION  1999   superficial spreading melanoma right post shoulder   NM MYOCAR PERF WALL MOTION  12/28/2008   No ischemia   SKIN CANCER EXCISION  03/02/2013   superficial basal cell cancer   US ECHOCARDIOGRAPHY  10/16/2010   mitral valve leaflets midly thickened,trace MR.   WRIST SURGERY Left 2011    Family History  Problem Relation Age of Onset   Rheum arthritis Mother    Stroke Mother    Heart disease Mother        Father, brother, sister   Lung cancer Father    Emphysema Father        PGF, brother, sister   COPD Father        PGF, brother, sister   Heart disease Father    Heart disease Sister    Alzheimer's disease Sister    Heart disease Brother    Ovarian cancer  Paternal Aunt    Lung cancer Paternal Grandfather    Breast cancer Neg Hx     Social History   Socioeconomic History   Marital status: Married    Spouse name: sammy Rinkenberger   Number of children: 1   Years of education: Not on file   Highest education level: Not on file  Occupational History   Occupation: Retired  Tobacco Use   Smoking status: Former    Packs/day: 0.50    Years: 20.00    Additional pack years: 0.00    Total pack years: 10.00    Types: Cigarettes    Quit date: 02/09/1990    Years since quitting: 32.2   Smokeless tobacco: Never  Vaping Use   Vaping Use: Never used  Substance and Sexual Activity   Alcohol use: No    Alcohol/week: 0.0 standard drinks of alcohol   Drug use: No   Sexual activity: Yes  Other Topics Concern   Not on file  Social History  Narrative   She lives her family, she has one child, retired, no smoke no drink   Social Determinants of Radio broadcast assistant Strain: Low Risk  (02/21/2021)   Overall Financial Resource Strain (CARDIA)    Difficulty of Paying Living Expenses: Not hard at all  Food Insecurity: No Food Insecurity (02/21/2021)   Hunger Vital Sign    Worried About Running Out of Food in the Last Year: Never true    [place] in the Last Year: Never true  Transportation Needs: No Transportation Needs (02/21/2021)   PRAPARE - Hydrologist (Medical): No    Lack of Transportation (Non-Medical): No  Physical Activity: Not on file  Stress: No Stress Concern Present (02/21/2021)   [place]    Feeling of Stress : Not at all  Social Connections: Unknown (02/21/2021)   Social Connection and Isolation Panel [NHANES]    Frequency of Communication with Friends and Family: Not on file    Frequency of Social Gatherings with Friends and Family: Not on file    Attends Religious Services: Not on file    Active Member of Clubs or Organizations: Not on file    Attends Archivist Meetings: Not on file    Marital Status: Married  Intimate Partner Violence: Not At Risk (02/21/2021)   Humiliation, Afraid, Rape, and Kick questionnaire    Fear of Current or Ex-Partner: No    Emotionally Abused: No    Physically Abused: No    Sexually Abused: No     Outpatient Medications Prior to Visit  Medication Sig Dispense Refill   amLODipine (NORVASC) 5 MG tablet TAKE 1 TABLET(5 MG) BY MOUTH DAILY 90 tablet 3   buPROPion (WELLBUTRIN XL) 150 MG 24 hr tablet Take 450 mg by mouth daily.     calcium-vitamin D (OSCAL WITH D) 500-200 MG-UNIT per tablet Take 1 tablet by mouth 2 (two) times daily.     LORazepam (ATIVAN) 0.5 MG tablet Take 0.5 mg by mouth See admin instructions. TAKE 1 TABLET (0.5 MG) SCHEDULED IN THE MORNING & 1  TABLET (0.5 MG) IN THE EVENING AS NEEDED FOR ANXIETY.  5   losartan (COZAAR) 100 MG tablet Take 1 tablet (100 mg total) by mouth daily. 90 tablet 3   amoxicillin (AMOXIL) 500 MG capsule Take 1 capsule (500 mg total) by mouth 3 (three) times daily. 21 capsule 0   meloxicam (  MOBIC) 7.5 MG tablet Take 1 tablet (7.5 mg total) by mouth daily. 30 tablet 3   methylPREDNISolone (MEDROL DOSEPAK) 4 MG TBPK tablet 6 day dose pack - take as directed 21 tablet 0   No facility-administered medications prior to visit.    Allergies  Allergen Reactions   Codeine Nausea And Vomiting   Macrobid [Nitrofurantoin]     Upsets stomach.   Metoprolol     Severe bradycardia    Adhesive [Tape] Rash    PAPER TAPE ONLY (INTOLERANCE TO LEADS--EKG)   Alendronate Sodium Other (See Comments)    REACTION: pt states INTOL to Fosamax   Verapamil Other (See Comments)    REACTION: Intol w/ bradycardia    ROS Review of Systems  Constitutional:        Hair loss  HENT: Negative.    Respiratory: Negative.    Cardiovascular: Negative.   Neurological: Negative.   Psychiatric/Behavioral: Negative.        Objective:    Physical Exam Constitutional:      Appearance: Normal appearance. She is normal weight.  HENT:     Head:     Comments: No bald spot noted.  Cardiovascular:     Rate and Rhythm: Normal rate and regular rhythm.     Pulses: Normal pulses.     Heart sounds: Normal heart sounds. No murmur heard.    No friction rub.  Pulmonary:     Effort: Pulmonary effort is normal.     Breath sounds: Normal breath sounds. No stridor. No wheezing.  Neurological:     General: No focal deficit present.     Mental Status: She is alert and oriented to person, place, and time. Mental status is at baseline.  Psychiatric:        Mood and Affect: Mood normal.        Behavior: Behavior normal.        Thought Content: Thought content normal.        Judgment: Judgment normal.     BP 120/84   Pulse (!) 59   Temp 97.9  F (36.6 C)   Ht 5\' 3"  (1.6 m)   Wt 106 lb (48.1 kg)   SpO2 96%   BMI 18.78 kg/m  Wt Readings from Last 3 Encounters:  04/30/22 106 lb (48.1 kg)  12/08/21 105 lb (47.6 kg)  12/01/21 110 lb (49.9 kg)     Health Maintenance  Topic Date Due   Zoster Vaccines- Shingrix (1 of 2) Never done   MAMMOGRAM  07/02/2021   Medicare Annual Wellness (AWV)  02/21/2022   INFLUENZA VACCINE  05/10/2022 (Originally 09/09/2021)   COVID-19 Vaccine (4 - 2023-24 season) 05/16/2022 (Originally 10/10/2021)   DTaP/Tdap/Td (2 - Td or Tdap) 07/08/2025   Pneumonia Vaccine 47+ Years old  Completed   DEXA SCAN  Completed   HPV VACCINES  Aged Out   COLONOSCOPY (Pts 45-64yrs Insurance coverage will need to be confirmed)  Discontinued   Hepatitis C Screening  Discontinued    There are no preventive care reminders to display for this patient.  Lab Results  Component Value Date   TSH 4.01 04/30/2022   Lab Results  Component Value Date   WBC 4.6 09/13/2021   HGB 14.8 09/13/2021   HCT 43.4 09/13/2021   MCV 96.0 09/13/2021   PLT 209 09/13/2021   Lab Results  Component Value Date   NA 138 09/13/2021   K 4.8 09/13/2021   CO2 25 09/13/2021   GLUCOSE 113 (H)  09/13/2021   BUN 17 09/13/2021   CREATININE 1.00 09/13/2021   BILITOT 0.5 12/11/2020   ALKPHOS 81 12/11/2020   AST 21 12/11/2020   ALT 13 12/11/2020   PROT 6.7 12/11/2020   ALBUMIN 4.4 12/11/2020   CALCIUM 9.9 09/13/2021   ANIONGAP 7 09/13/2021   EGFR 54 (L) 12/11/2020   GFR 57.02 (L) 12/16/2017   Lab Results  Component Value Date   CHOL 206 (H) 12/11/2020   Lab Results  Component Value Date   HDL 98 12/11/2020   Lab Results  Component Value Date   LDLCALC 98 12/11/2020   Lab Results  Component Value Date   TRIG 52 12/11/2020   Lab Results  Component Value Date   CHOLHDL 2.1 12/11/2020   No results found for: "HGBA1C"    Assessment & Plan:  Hair loss Assessment & Plan: No bald spot noted on the scalp. Will check TSH  level.   Orders: -     TSH  B12 deficiency -     Cyanocobalamin    Follow-up: No follow-ups on file.   Theresia Lo, NP

## 2022-04-30 NOTE — Progress Notes (Signed)
Patient was administered a b12 injection into her right deltoid. Patient tolerated the injection well and did not show any signs of distress or voice any concerns.

## 2022-04-30 NOTE — Patient Instructions (Signed)
Will check TSH today. Will call you with the result.

## 2022-04-30 NOTE — Telephone Encounter (Signed)
Pt saw Toy Care, NP today.

## 2022-05-01 NOTE — Telephone Encounter (Signed)
Pt returned Gae Bon CMA call about results. Unable to transfer. Note below from lab results was read to pt. Pt aware.

## 2022-05-01 NOTE — Progress Notes (Signed)
TSH normal

## 2022-05-10 DIAGNOSIS — L659 Nonscarring hair loss, unspecified: Secondary | ICD-10-CM | POA: Insufficient documentation

## 2022-05-10 NOTE — Assessment & Plan Note (Signed)
No bald spot noted on the scalp. Will check TSH level.

## 2022-05-13 ENCOUNTER — Ambulatory Visit (INDEPENDENT_AMBULATORY_CARE_PROVIDER_SITE_OTHER): Payer: Medicare Other | Admitting: Dermatology

## 2022-05-13 DIAGNOSIS — L814 Other melanin hyperpigmentation: Secondary | ICD-10-CM | POA: Diagnosis not present

## 2022-05-13 DIAGNOSIS — L578 Other skin changes due to chronic exposure to nonionizing radiation: Secondary | ICD-10-CM | POA: Diagnosis not present

## 2022-05-13 DIAGNOSIS — L821 Other seborrheic keratosis: Secondary | ICD-10-CM

## 2022-05-13 DIAGNOSIS — Z86018 Personal history of other benign neoplasm: Secondary | ICD-10-CM

## 2022-05-13 DIAGNOSIS — Z85828 Personal history of other malignant neoplasm of skin: Secondary | ICD-10-CM

## 2022-05-13 DIAGNOSIS — L609 Nail disorder, unspecified: Secondary | ICD-10-CM

## 2022-05-13 DIAGNOSIS — Z85820 Personal history of malignant melanoma of skin: Secondary | ICD-10-CM

## 2022-05-13 DIAGNOSIS — Z1283 Encounter for screening for malignant neoplasm of skin: Secondary | ICD-10-CM | POA: Diagnosis not present

## 2022-05-13 DIAGNOSIS — D229 Melanocytic nevi, unspecified: Secondary | ICD-10-CM

## 2022-05-13 DIAGNOSIS — D1801 Hemangioma of skin and subcutaneous tissue: Secondary | ICD-10-CM

## 2022-05-13 DIAGNOSIS — I8390 Asymptomatic varicose veins of unspecified lower extremity: Secondary | ICD-10-CM

## 2022-05-13 NOTE — Patient Instructions (Addendum)
Start over the counter Biotin vitamin 2.5 mg take 1 tablet daily     Due to recent changes in healthcare laws, you may see results of your pathology and/or laboratory studies on MyChart before the doctors have had a chance to review them. We understand that in some cases there may be results that are confusing or concerning to you. Please understand that not all results are received at the same time and often the doctors may need to interpret multiple results in order to provide you with the best plan of care or course of treatment. Therefore, we ask that you please give [place] 2 business days to thoroughly review all your results before contacting the office for clarification. Should we see a critical lab result, you will be contacted sooner.   If You Need Anything After Your Visit  If you have any questions or concerns for your doctor, please call our main line at [phone] and press option 4 to reach your doctor's medical assistant. If no one answers, please leave a voicemail as directed and we will return your call as soon as possible. Messages left after 4 pm will be answered the following business day.   You may also send [place] a message via [place]. We typically respond to MyChart messages within 1-2 business days.  For prescription refills, please ask your pharmacy to contact our office. Our fax number is [phone].  If you have an urgent issue when the clinic is closed that cannot wait until the next business day, you can page your doctor at the number below.    Please note that while we do our best to be available for urgent issues outside of office hours, we are not available 24/7.   If you have an urgent issue and are unable to reach [place], you may choose to seek medical care at your doctor's office, retail clinic, urgent care center, or emergency room.  If you have a medical emergency, please immediately call 911 or go to the emergency department.  Pager Numbers  - Dr.  Nehemiah Massed: [phone]  - Dr. Laurence Ferrari: [phone]  - Dr. Nicole Kindred: [phone]  In the event of inclement weather, please call our main line at [phone] for an update on the status of any delays or closures.  Dermatology Medication Tips: Please keep the boxes that topical medications come in in order to help keep track of the instructions about where and how to use these. Pharmacies typically print the medication instructions only on the boxes and not directly on the medication tubes.   If your medication is too expensive, please contact our office at [phone] option 4 or send [place] a message through [place].   We are unable to tell what your co-pay for medications will be in advance as this is different depending on your insurance coverage. However, we may be able to find a substitute medication at lower cost or fill out paperwork to get insurance to cover a needed medication.   If a prior authorization is required to get your medication covered by your insurance company, please allow [place] 1-2 business days to complete this process.  Drug prices often vary depending on where the prescription is filled and some pharmacies may offer cheaper prices.  The website www.goodrx.com contains coupons for medications through different pharmacies. The prices here do not account for what the cost may be with help from insurance (it may be cheaper with your insurance), but the website can give you the  price if you did not use any insurance.  - You can print the associated coupon and take it with your prescription to the pharmacy.  - You may also stop by our office during regular business hours and pick up a GoodRx coupon card.  - If you need your prescription sent electronically to a different pharmacy, notify our office through [facility] or by phone at [phone] option 4.     Si Usted Necesita Algo Despus de Su Visita  Tambin puede enviarnos un mensaje a travs de Pharmacist, community. Por lo  general respondemos a los mensajes de MyChart en el transcurso de 1 a 2 das hbiles.  Para renovar recetas, por favor pida a su farmacia que se ponga en contacto con nuestra oficina. Harland Dingwall de fax es [place] [phone].  Si tiene un asunto urgente cuando la clnica est cerrada y que no puede esperar hasta el siguiente da hbil, puede llamar/localizar a su doctor(a) al nmero que aparece a continuacin.   Por favor, tenga en cuenta que aunque hacemos todo lo posible para estar disponibles para asuntos urgentes fuera del horario de [place], no estamos disponibles las 24 horas del da, los 7 das de la [place].   Si tiene un problema urgente y no puede comunicarse con nosotros, puede optar por buscar atencin mdica  en el consultorio de su doctor(a), en una clnica privada, en un centro de atencin urgente o en una sala de emergencias.  Si tiene Engineering geologist, por favor llame inmediatamente al 911 o vaya a la sala de emergencias.  Nmeros de bper  - Dr. Nehemiah Massed: [phone]  - Dra. Moye: [phone]  - Dra. Nicole Kindred: [phone]  En caso de inclemencias del [place], por favor llame a Johnsie Kindred principal al [phone] para una actualizacin sobre el [place] de cualquier retraso o cierre.  Consejos para la medicacin en dermatologa: Por favor, guarde las cajas en las que vienen los medicamentos de uso tpico para ayudarle a seguir las instrucciones sobre dnde y cmo usarlos. Las farmacias generalmente imprimen las instrucciones del medicamento slo en las cajas y no directamente en los tubos del [place].   Si su medicamento es muy caro, por favor, pngase en contacto con Zigmund Daniel llamando al [phone] y presione la opcin 4 o envenos un mensaje a travs de Pharmacist, community.   No podemos decirle cul ser su copago por los medicamentos por adelantado ya que esto es diferente dependiendo de la cobertura de su seguro. Sin embargo, es posible que podamos encontrar un  medicamento sustituto a Electrical engineer un formulario para que el seguro cubra el medicamento que se considera necesario.   Si se requiere una autorizacin previa para que su compaa de seguros [place] su medicamento, por favor permtanos de 1 a 2 das hbiles para completar este proceso.  Los precios de los medicamentos varan con frecuencia dependiendo del Environmental consultant de dnde se surte la receta y alguna farmacias pueden ofrecer precios ms baratos.  El sitio web www.goodrx.com tiene cupones para medicamentos de Airline pilot. Los precios aqu no tienen en cuenta lo que podra costar con la ayuda del seguro (puede ser ms barato con su seguro), pero el sitio web puede darle el precio si no utiliz Research scientist (physical sciences).  - Puede imprimir el cupn correspondiente y llevarlo con su receta a la farmacia.  - Tambin puede pasar por nuestra oficina durante el horario de atencin regular y Charity fundraiser una tarjeta de cupones de GoodRx.  - Si necesita que  su receta se enve electrnicamente a una farmacia diferente, informe a nuestra oficina a travs de MyChart de [place] o por telfono llamando al [phone] y presione la opcin 4.  

## 2022-05-13 NOTE — Progress Notes (Signed)
Follow-Up Visit   Subjective  Chloe Perkins is a 77 y.o. female who presents for the following: Skin Cancer Screening and Full Body Skin Exam. Hx of Melanoma of the right posterior shoulder-1999, hx of BCC, hx of Atypical nevus. The patient presents for Total-Body Skin Exam (TBSE) for skin cancer screening and mole check. The patient has spots, moles and lesions to be evaluated, some may be new or changing and the patient has concerns that these could be cancer.  The following portions of the chart were reviewed this encounter and updated as appropriate: medications, allergies, medical history  Review of Systems:  No other skin or systemic complaints except as noted in HPI or Assessment and Plan.  Objective  Well appearing patient in no apparent distress; mood and affect are within normal limits.  A full examination was performed including scalp, head, eyes, ears, nose, lips, neck, chest, axillae, abdomen, back, buttocks, bilateral upper extremities, bilateral lower extremities, hands, feet, fingers, toes, fingernails, and toenails. All findings within normal limits unless otherwise noted below.   Relevant physical exam findings are noted in the Assessment and Plan.   Assessment & Plan   LENTIGINES, SEBORRHEIC KERATOSES, HEMANGIOMAS - Benign normal skin lesions - Benign-appearing - Call for any changes  MELANOCYTIC NEVI - Tan-brown and/or pink-flesh-colored symmetric macules and papules - Benign appearing on exam today - Observation - Call clinic for new or changing moles - Recommend daily use of broad spectrum spf 30+ sunscreen to sun-exposed areas.   ACTINIC DAMAGE - Chronic condition, secondary to cumulative UV/sun exposure - diffuse scaly erythematous macules with underlying dyspigmentation - Recommend daily broad spectrum sunscreen SPF 30+ to sun-exposed areas, reapply every 2 hours as needed.  - Staying in the shade or wearing long sleeves, sun glasses (UVA+UVB  protection) and wide brim hats (4-inch brim around the entire circumference of the hat) are also recommended for sun protection.  - Call for new or changing lesions.  Varicose Veins/Spider Veins - Dilated blue, purple or red veins at the lower extremities - Reassured - Smaller vessels can be treated by sclerotherapy (a procedure to inject a medicine into the veins to make them disappear) if desired, but the treatment is not covered by insurance. Larger vessels may be covered if symptomatic and we would refer to vascular surgeon if treatment desired.   NAIL PROBLEM/Onychoschizia Exam: dystrophic nail Treatment Plan: Start otc Biotin 2.5 mg take 1 tablet daily  Start otc Dermanail apply to nails daily    HISTORY OF DYSPLASTIC NEVI - No evidence of recurrence today - Recommend regular full body skin exams - Recommend daily broad spectrum sunscreen SPF 30+ to sun-exposed areas, reapply every 2 hours as needed.  - Call if any new or changing lesions are noted between office visits   HISTORY OF BASAL CELL CARCINOMA OF THE SKIN - No evidence of recurrence today - Recommend regular full body skin exams - Recommend daily broad spectrum sunscreen SPF 30+ to sun-exposed areas, reapply every 2 hours as needed.  - Call if any new or changing lesions are noted between office visits   HISTORY OF MELANOMA Right posterior shoulder 1999 - No evidence of recurrence today - No lymphadenopathy - Recommend regular full body skin exams - Recommend daily broad spectrum sunscreen SPF 30+ to sun-exposed areas, reapply every 2 hours as needed.  - Call if any new or changing lesions are noted between office visits   SKIN CANCER SCREENING PERFORMED TODAY.  Return in about 1  year (around 05/13/2023) for TBSE, hx of Melanoma .  IAngelique Holm, CMA, am acting as scribe for Armida Sans, MD .   Documentation: I have reviewed the above documentation for accuracy and completeness, and I agree with the  above.  Armida Sans, MD

## 2022-05-14 ENCOUNTER — Encounter: Payer: Self-pay | Admitting: Dermatology

## 2022-06-01 ENCOUNTER — Ambulatory Visit: Payer: Medicare Other

## 2022-06-05 ENCOUNTER — Other Ambulatory Visit (INDEPENDENT_AMBULATORY_CARE_PROVIDER_SITE_OTHER): Payer: Medicare Other

## 2022-06-05 ENCOUNTER — Encounter: Payer: Self-pay | Admitting: Orthopaedic Surgery

## 2022-06-05 ENCOUNTER — Ambulatory Visit (INDEPENDENT_AMBULATORY_CARE_PROVIDER_SITE_OTHER): Payer: Medicare Other | Admitting: Orthopaedic Surgery

## 2022-06-05 VITALS — BP 147/74 | HR 86 | Ht 63.0 in | Wt 105.0 lb

## 2022-06-05 DIAGNOSIS — M542 Cervicalgia: Secondary | ICD-10-CM | POA: Diagnosis not present

## 2022-06-05 NOTE — Progress Notes (Signed)
Office Visit Note   Patient: Chloe Perkins           Date of Birth: [birth year]           MRN: [id number] Visit Date: 06/05/2022              Requested by: Sherlene Shams, MD [address] [place],  [place] [address] PCP: Sherlene Shams, MD   Assessment & Plan: Visit Diagnoses:  1. Neck pain   2. Cervicalgia     Plan: Plain radiographs reviewed she has significant spondylosis with anterolisthesis at C5-6.  Will set up for this problem cervical traction proceed with MRI scan failed conservative treatment anti-inflammatories time prednisone Dosepak home stretching program x 2 months without relief.  Office follow-up after MRI scan.  Follow-Up Instructions: No follow-ups on file.   Orders:  Orders Placed This Encounter  Procedures   XR Cervical Spine 2 or 3 views   MR Cervical Spine w/o contrast   No orders of the defined types were placed in this encounter.     Procedures: No procedures performed   Clinical Data: No additional findings.   Subjective: Chief Complaint  Patient presents with   Neck - Pain    HPI 77 year old female here on her birthday new patient with more neck than low back pain.  Pain radiates from neck into her shoulders and severe pain when she tries to turn her head.  Pain radiates into her upper arms.  She has not noticed any gait disturbance.  She has used ibuprofen Profen also Tylenol with slight relief but the ibuprofen does not make her blood pressure go up.  Her symptoms been going on for at least 3 months.  Additionally history of solitary pulmonary nodule, melanoma, hypothyroidism, stage III kidney disease coronary artery disease B12 anxiety and depression.  Patient is already had prednisone Dosepak last month which did not help.  Review of Systems all other systems are noncontributory to HPI.    Objective: Vital Signs: BP (!) 147/74   Pulse 86   Ht 5\' 3"  (1.6 m)   Wt 105 lb (47.6 kg)   BMI 18.60 kg/m   Physical  Exam Constitutional:      Appearance: She is well-developed.  HENT:     Head: Normocephalic.     Right Ear: External ear normal.     Left Ear: External ear normal. There is no impacted cerumen.  Eyes:     Pupils: Pupils are equal, round, and reactive to light.  Neck:     Thyroid: No thyromegaly.     Trachea: No tracheal deviation.  Cardiovascular:     Rate and Rhythm: Normal rate.  Pulmonary:     Effort: Pulmonary effort is normal.  Abdominal:     Palpations: Abdomen is soft.  Musculoskeletal:     Cervical back: No rigidity.  Skin:    General: Skin is warm and dry.  Neurological:     Mental Status: She is alert and oriented to person, place, and time.  Psychiatric:        Behavior: Behavior normal.     Ortho Exam patient has increased pain with cervical compression she does get some relief with cervical distraction.  Upper extremity reflexes are 2+ and symmetrical no lower extremity clonus no lower extremity hyperreflexia normal heel-toe gait mild sciatic notch tenderness.  Bilateral severe brachial plexus tenderness positive Spurling right and left.  No thenar or hypothenar atrophy biceps triceps wrist flexion extension  interossei finger flexors extensors are normal.  Median nerve at the wrist ulnar nerve at the elbows are normal.  Specialty Comments:  No specialty comments available.  Imaging: AP and lateral cervical spine images are obtained and reviewed.  This shows 50% narrowing C4-5 disc space.  There is a least 3 mm anterolisthesis at C5-6 with significant degenerative facet joints.  Impression: Mid cervical spondylosis most severe at C5-6 where there is degenerative anterolisthesis.  Disc base narrowing at C4-5 also noted.   PMFS History: Patient Active Problem List   Diagnosis Date Noted   Hair loss 05/10/2022   Anxiety 12/17/2021   Irregular heart rhythm 12/17/2021   Joint pain 12/17/2021   Low back pain 12/17/2021   Malignant melanoma of skin of shoulder  (HCC) 12/17/2021   Melanoma of trunk (HCC) 12/17/2021   Left lower quadrant pain 12/17/2021   Colon cancer screening 03/23/2020   PAC (premature atrial contraction) 02/08/2019   Urinary hesitancy 10/25/2017   CAD (coronary artery disease), native coronary artery 07/06/2017   Spondylolysis, cervical region 05/19/2016   Solitary pulmonary nodule 05/04/2015   B12 deficiency 05/04/2015   Encounter for preventive health examination 03/19/2015   S/P hysterectomy 06/26/2013   CKD (chronic kidney disease) stage 3, GFR 30-59 ml/min (HCC) 06/26/2013   Subclinical hypothyroidism 06/26/2013   Mitral valve prolapse    HLD (hyperlipidemia)    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   Peripheral retinal scars 10/08/2011   PVD (posterior vitreous detachment), left eye 10/08/2011   Anxiety and depression 09/03/2010   IBS (irritable bowel syndrome) 09/03/2010   Essential hypertension 02/06/2008   Osteoporosis 02/06/2008   PERSONAL HISTORY OF MALIGNANT MELANOMA OF SKIN 02/06/2008   Past Medical History:  Diagnosis Date   Anxiety and depression    Arthritis    Asthmatic bronchitis    Atypical nevus 01/27/2011   right side abdomen-severe atypia   Atypical nevus 11/09/2012   moderate atypia-left upper back   Atypical nevus 11/19/2012   moderate atypia-left forearm   BCC (basal cell carcinoma) 10/23/2002   left bridge nose-Bcc with sclerosis   Bronchitis    history of   Cancer (HCC)    MELANOMA   Depression    Dysrhythmia    Heart murmur    Hemorrhoids    History of melanoma    HLD (hyperlipidemia)    Hypertension    Melanoma (HCC) 10/18/1997   right post sholder-melanoma level 1   Mitral valve prolapse    Osteoporosis    Personal history of malignant melanoma of skin    Superficial basal cell carcinoma (BCC) 01/10/2013   right cheek   SVT (supraventricular tachycardia)    SVT (supraventricular tachycardia) 05/29/2013   Tachycardia-bradycardia syndrome (HCC) 07/06/2017   Pt c/o  palpitations as well as bradycardia- (no symptoms)- noted on her home monitor.   Viral gastroenteritis     Family History  Problem Relation Age of Onset   Rheum arthritis Mother    Stroke Mother    Heart disease Mother        Father, brother, sister   Lung cancer Father    Emphysema Father        PGF, brother, sister   COPD Father        PGF, brother, sister   Heart disease Father    Heart disease Sister    Alzheimer's disease Sister    Heart disease Brother    Ovarian cancer Paternal Aunt    Lung cancer Paternal Grandfather  Breast cancer Neg Hx     Past Surgical History:  Procedure Laterality Date   ABDOMINAL HYSTERECTOMY  1975   CATARACT EXTRACTION W/PHACO Right 02/10/2017   Procedure: CATARACT EXTRACTION PHACO AND INTRAOCULAR LENS PLACEMENT (IOC);  Surgeon: Galen Manila, MD;  Location: [facility] ORS;  Service: Ophthalmology;  Laterality: Right;  [place] 00:35.9 AP% 11.5 CDE 4.11 Fluid Pack Lot # [id number]   CATARACT EXTRACTION W/PHACO Left 03/09/2017   Procedure: CATARACT EXTRACTION PHACO AND INTRAOCULAR LENS PLACEMENT (IOC);  Surgeon: Galen Manila, MD;  Location: [facility] OR[facility];  Service: Ophthalmology;  Laterality: Left;  [place] 00:23 AP% 16.3 CDE 3.85 Fluid pack lot # [id number]   COLONOSCOPY  04/15/2004   2006: Normal   ENDOMETRIAL ABLATION  1976   left thumb tendon repair  10/09   MELANOMA EXCISION  1999   superficial spreading melanoma right post shoulder   NM MYOCAR PERF WALL MOTION  12/28/2008   No ischemia   SKIN CANCER EXCISION  03/02/2013   superficial basal cell cancer   US ECHOCARDIOGRAPHY  10/16/2010   mitral valve leaflets midly thickened,trace MR.   WRIST SURGERY Left 2011   Social History   Occupational History   Occupation: Retired  Tobacco Use   Smoking status: Former    Packs/day: 0.50    Years: 20.00    Additional pack years: 0.00    Total pack years: 10.00    Types: Cigarettes    Quit date: 02/09/1990    Years since quitting: 32.3   Smokeless tobacco:  Never  Vaping Use   Vaping Use: Never used  Substance and Sexual Activity   Alcohol use: No    Alcohol/week: 0.0 standard drinks of alcohol   Drug use: No   Sexual activity: Yes

## 2022-06-09 ENCOUNTER — Other Ambulatory Visit: Payer: Self-pay

## 2022-06-09 MED ORDER — LOSARTAN POTASSIUM 100 MG PO TABS: 100.0000 mg | ORAL_TABLET | Freq: Every day | ORAL | 3 refills | Status: DC

## 2022-06-09 MED ORDER — AMLODIPINE BESYLATE 5 MG PO TABS: ORAL_TABLET | ORAL | 3 refills | Status: DC

## 2022-06-23 ENCOUNTER — Ambulatory Visit
Admission: RE | Admit: 2022-06-23 | Discharge: 2022-06-23 | Disposition: A | Discharge status: Home / Self Care | Payer: Medicare Other | Source: Ambulatory Visit | Attending: Orthopaedic Surgery | Admitting: Orthopaedic Surgery

## 2022-06-23 DIAGNOSIS — M542 Cervicalgia: Secondary | ICD-10-CM

## 2022-07-07 ENCOUNTER — Ambulatory Visit (INDEPENDENT_AMBULATORY_CARE_PROVIDER_SITE_OTHER): Payer: Medicare Other | Admitting: Orthopaedic Surgery

## 2022-07-07 ENCOUNTER — Encounter: Payer: Self-pay | Admitting: Orthopaedic Surgery

## 2022-07-07 VITALS — BP 139/74 | HR 71 | Ht 64.0 in | Wt 106.0 lb

## 2022-07-07 DIAGNOSIS — M4302 Spondylolysis, cervical region: Secondary | ICD-10-CM | POA: Diagnosis not present

## 2022-07-08 NOTE — Progress Notes (Signed)
Office Visit Note   Patient: Chloe Perkins           Date of Birth: [birth year]           MRN: [id number] Visit Date: 07/07/2022              Requested by: Sherlene Shams, MD [address] [place],  [place] [address] PCP: Sherlene Shams, MD   Assessment & Plan: Visit Diagnoses:  1. Spondylolysis, cervical region     Plan: Patient with foraminal stenosis C3-4 anterolisthesis C5-6 of 3.7 mm.  Her symptoms are fairly stable she can return if they increase.  We discussed options but at this point she does not think her symptoms are severe enough to consider any surgical intervention.  Follow-Up Instructions: No follow-ups on file.   Orders:  No orders of the defined types were placed in this encounter.  No orders of the defined types were placed in this encounter.     Procedures: No procedures performed   Clinical Data: No additional findings.   Subjective: Chief Complaint  Patient presents with   Neck - Pain    MRI review    HPI 77 year old female here with her husband for follow-up of cervical spondylosis.  She got relief with distraction in the office on last visit we set up for home traction but she states it has not helped at all and in fact is causing her neck to her worst.  She was using it appropriately as instructed.  She gets some relief with ibuprofen.  Continue primarily pain in her neck not terribly severe.  Does not limit any of her activities.  She is sleeping well.  Plain radiograph showed anterolisthesis at C5-6.  She has been on a stretching program Medrol Dosepak.  MRI scan is reviewed with her today.  It shows some progression of spondylosis and again anterolisthesis at C5-6 of 3.7 mm.  She has mild right foraminal stenosis at that level.  Small central disc at C4-5 without compression.  C3-4 with broad-based disc and severe right foraminal stenosis mild to moderate left foraminal stenosis.  Review of Systems updated unchanged from last  office visit   Objective: Vital Signs: BP 139/74   Pulse 71   Ht 5\' 4"  (1.626 m)   Wt 106 lb (48.1 kg)   BMI 18.19 kg/m   Physical Exam Constitutional:      Appearance: She is well-developed.  HENT:     Head: Normocephalic.     Right Ear: External ear normal.     Left Ear: External ear normal. There is no impacted cerumen.  Eyes:     Pupils: Pupils are equal, round, and reactive to light.  Neck:     Thyroid: No thyromegaly.     Trachea: No tracheal deviation.  Cardiovascular:     Rate and Rhythm: Normal rate.  Pulmonary:     Effort: Pulmonary effort is normal.  Abdominal:     Palpations: Abdomen is soft.  Musculoskeletal:     Cervical back: No rigidity.  Skin:    General: Skin is warm and dry.  Neurological:     Mental Status: She is alert and oriented to person, place, and time.  Psychiatric:        Behavior: Behavior normal.     Ortho Exam some brachial plexus tenderness reflexes are intact no lower extremity clonus normal heel-toe gait.  Interossei are strong no isolated motor weakness.  Specialty Comments:  No specialty  comments available.  Imaging:  Final [99]   PACS Intelerad Image Link   Show images for MR Cervical Spine w/o contrast Study Result  Narrative & Impression  CLINICAL DATA:  Cervical pain radiating to both shoulders and upper back.   EXAM: MRI CERVICAL SPINE WITHOUT CONTRAST   TECHNIQUE: Multiplanar, multisequence MR imaging of the cervical spine was performed. No intravenous contrast was administered.   COMPARISON:  07/30/2005   FINDINGS: Alignment: 3.7 mm anterolisthesis of C5 on C6.   Vertebrae: No acute fracture, evidence of discitis, or aggressive bone lesion.   Cord: Normal signal and morphology.   Posterior Fossa, vertebral arteries, paraspinal tissues: Posterior fossa demonstrates no focal abnormality. Vertebral artery flow voids are maintained. Paraspinal soft tissues are unremarkable.   Disc levels:   Discs:  Degenerative disease with disc height loss at C5-6 and C6-7.   C2-3: No disc protrusion. Mild right facet arthropathy. Mild right foraminal stenosis. No left foraminal stenosis. No spinal stenosis.   C3-4: Broad-based disc bulge. Bilateral uncovertebral degenerative changes, right worse than left. Moderate right facet arthropathy. Severe right foraminal stenosis. Mild-moderate left foraminal stenosis. No spinal stenosis.   C4-5: Broad-based disc bulge with a small central disc protrusion. Mild bilateral facet arthropathy. Mild bilateral foraminal stenosis. No spinal stenosis.   C5-6: Broad-based disc bulge. Moderate bilateral facet arthropathy. Mild right foraminal stenosis. No left foraminal stenosis. No spinal stenosis.   C6-7: No significant disc bulge. No neural foraminal stenosis. No central canal stenosis.   C7-T1: No significant disc bulge. No neural foraminal stenosis. No central canal stenosis.   IMPRESSION: 1. Cervical spine spondylosis as described above which has progressed compared with 07/30/2005. 2. No acute osseous injury of the cervical spine.     Electronically Signed   By: Elige Ko M.D.   On: 06/29/2022 08:24       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) 12/17/2021   Melanoma of trunk (HCC) 12/17/2021   Left lower quadrant pain 12/17/2021   Colon cancer screening 03/23/2020   PAC (premature atrial contraction) 02/08/2019   Urinary hesitancy 10/25/2017   CAD (coronary artery disease), native coronary artery 07/06/2017   Spondylolysis, cervical region 05/19/2016   Solitary pulmonary nodule 05/04/2015   B12 deficiency 05/04/2015   Encounter for preventive health examination 03/19/2015   S/P hysterectomy 06/26/2013   CKD (chronic kidney  disease) stage 3, GFR 30-59 ml/min (HCC) 06/26/2013   Subclinical hypothyroidism 06/26/2013   Mitral valve prolapse    HLD (hyperlipidemia)    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   Peripheral retinal scars 10/08/2011   PVD (posterior vitreous detachment), left eye 10/08/2011   Anxiety and depression 09/03/2010   IBS (irritable bowel syndrome) 09/03/2010   Essential hypertension 02/06/2008   Osteoporosis 02/06/2008   PERSONAL HISTORY OF MALIGNANT MELANOMA OF SKIN 02/06/2008   Past Medical History:  Diagnosis Date   Anxiety and depression    Arthritis    Asthmatic bronchitis    Atypical nevus 01/27/2011   right side abdomen-severe atypia   Atypical nevus 11/09/2012   moderate atypia-left upper back   Atypical nevus 11/19/2012   moderate atypia-left forearm   BCC (basal cell carcinoma) 10/23/2002   left bridge nose-Bcc with sclerosis   Bronchitis    history  of   Cancer (HCC)    MELANOMA   Depression    Dysrhythmia    Heart murmur    Hemorrhoids    History of melanoma    HLD (hyperlipidemia)    Hypertension    Melanoma (HCC) 10/18/1997   right post sholder-melanoma level 1   Mitral valve prolapse    Osteoporosis    Personal history of malignant melanoma of skin    Superficial basal cell carcinoma (BCC) 01/10/2013   right cheek   SVT (supraventricular tachycardia)    SVT (supraventricular tachycardia) 05/29/2013   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   Viral gastroenteritis     Family History  Problem Relation Age of Onset   Rheum arthritis Mother    Stroke Mother    Heart disease Mother        Father, brother, sister   Lung cancer Father    Emphysema Father        PGF, brother, sister   COPD Father        PGF, brother, sister   Heart disease Father    Heart disease Sister    Alzheimer's disease Sister    Heart disease Brother    Ovarian cancer Paternal  Aunt    Lung cancer Paternal Grandfather    Breast cancer Neg Hx     Past Surgical History:  Procedure Laterality Date   ABDOMINAL HYSTERECTOMY  1975   CATARACT EXTRACTION W/PHACO Right 02/10/2017   Procedure: CATARACT EXTRACTION PHACO AND INTRAOCULAR LENS PLACEMENT (IOC);  Surgeon: Galen Manila, MD;  Location: [facility] ORS;  Service: Ophthalmology;  Laterality: Right;  [place] 00:35.9 AP% 11.5 CDE 4.11 Fluid Pack Lot # [id number]   CATARACT EXTRACTION W/PHACO Left 03/09/2017   Procedure: CATARACT EXTRACTION PHACO AND INTRAOCULAR LENS PLACEMENT (IOC);  Surgeon: Galen Manila, MD;  Location: [facility] OR[facility];  Service: Ophthalmology;  Laterality: Left;  [place] 00:23 AP% 16.3 CDE 3.85 Fluid pack lot # [id number]   COLONOSCOPY  04/15/2004   2006: Normal   ENDOMETRIAL ABLATION  1976   left thumb tendon repair  10/09   MELANOMA EXCISION  1999   superficial spreading melanoma right post shoulder   NM MYOCAR PERF WALL MOTION  12/28/2008   No ischemia   SKIN CANCER EXCISION  03/02/2013   superficial basal cell cancer   US ECHOCARDIOGRAPHY  10/16/2010   mitral valve leaflets midly thickened,trace MR.   WRIST SURGERY Left 2011   Social History   Occupational History   Occupation: Retired  Tobacco Use   Smoking status: Former    Packs/day: 0.50    Years: 20.00    Additional pack years: 0.00    Total pack years: 10.00    Types: Cigarettes    Quit date: 02/09/1990    Years since quitting: 32.4   Smokeless tobacco: Never  Vaping Use   Vaping Use: Never used  Substance and Sexual Activity   Alcohol use: No    Alcohol/week: 0.0 standard drinks of alcohol   Drug use: No   Sexual activity: Yes

## 2022-07-20 ENCOUNTER — Ambulatory Visit: Payer: Medicare Other

## 2022-07-21 ENCOUNTER — Telehealth: Payer: Self-pay

## 2022-07-21 NOTE — Patient Outreach (Signed)
  Care Coordination   07/21/2022 Name: Chloe Perkins MRN: [id number] DOB: March 02, 1945   Care Coordination Outreach Attempts:  Successful contact made with patient.  Patient states she is not interested.   Follow Up Plan:  No further outreach attempts will be made at this time. We have been unable to contact the patient to offer or enroll patient in care coordination services  Encounter Outcome:  Pt. Visit Completed   Care Coordination Interventions:  No, not indicated    [facility] In[facility] Care Coordination [phone] direct line

## 2022-07-23 ENCOUNTER — Ambulatory Visit (INDEPENDENT_AMBULATORY_CARE_PROVIDER_SITE_OTHER): Payer: Medicare Other

## 2022-07-23 DIAGNOSIS — E538 Deficiency of other specified B group vitamins: Secondary | ICD-10-CM

## 2022-07-23 MED ORDER — CYANOCOBALAMIN 1000 MCG/ML IJ SOLN
1000.0000 ug | Freq: Once | INTRAMUSCULAR | Status: AC
  Administered 2022-07-23: 1000 ug via INTRAMUSCULAR

## 2022-07-23 NOTE — Progress Notes (Signed)
Patient arrived for a B12 injection and it was administered into her right deltoid. Patient tolerated the injection well and did not show any signs of distress or voice any concerns. 

## 2022-08-24 ENCOUNTER — Ambulatory Visit: Payer: Medicare Other | Admitting: *Deleted

## 2022-08-24 DIAGNOSIS — E538 Deficiency of other specified B group vitamins: Secondary | ICD-10-CM

## 2022-08-24 MED ORDER — CYANOCOBALAMIN 1000 MCG/ML IJ SOLN
1000.0000 ug | Freq: Once | INTRAMUSCULAR | Status: AC
  Administered 2022-08-24: 1000 ug via INTRAMUSCULAR

## 2022-08-24 NOTE — Progress Notes (Signed)
Patient arrived for a B12 injection and it was administered into her left deltoid. Patient tolerated the injection well and did not show any signs of distress or voice any concerns. 

## 2022-09-24 ENCOUNTER — Ambulatory Visit (INDEPENDENT_AMBULATORY_CARE_PROVIDER_SITE_OTHER): Payer: Medicare Other

## 2022-09-24 ENCOUNTER — Ambulatory Visit: Payer: Medicare Other

## 2022-09-24 DIAGNOSIS — E538 Deficiency of other specified B group vitamins: Secondary | ICD-10-CM | POA: Diagnosis not present

## 2022-09-24 MED ORDER — CYANOCOBALAMIN 1000 MCG/ML IJ SOLN
1000.0000 ug | Freq: Once | INTRAMUSCULAR | Status: AC
  Administered 2022-09-24: 1000 ug via INTRAMUSCULAR

## 2022-09-24 NOTE — Progress Notes (Signed)
After obtaining consent, and per orders of Dr. Darrick Huntsman, injection of b12 given by Kristie Cowman in the right deltoid. Patient tolerated inject well.

## 2022-10-07 ENCOUNTER — Ambulatory Visit (INDEPENDENT_AMBULATORY_CARE_PROVIDER_SITE_OTHER): Payer: Medicare Other | Admitting: Emergency Medicine

## 2022-10-07 VITALS — Ht 63.0 in | Wt 106.0 lb

## 2022-10-07 DIAGNOSIS — Z1231 Encounter for screening mammogram for malignant neoplasm of breast: Secondary | ICD-10-CM | POA: Diagnosis not present

## 2022-10-07 DIAGNOSIS — Z0000 Encounter for general adult medical examination without abnormal findings: Secondary | ICD-10-CM | POA: Diagnosis not present

## 2022-10-07 DIAGNOSIS — Z780 Asymptomatic menopausal state: Secondary | ICD-10-CM | POA: Diagnosis not present

## 2022-10-07 NOTE — Patient Instructions (Addendum)
Chloe Perkins , Thank you for taking time to come for your Medicare Wellness Visit. I appreciate your ongoing commitment to your health goals. Please review the following plan we discussed and let me know if I can assist you in the future.   Referrals/Orders/Follow-Ups/Clinician Recommendations: Get the flu shot this fall. Consider getting the shingles vaccine. I have placed order for a mammogram and bone density test. Call [facility] @ [phone] to schedule. I made an appointment with Chloe Perkins for routine follow up on 12/16/22 @ 9:30am.   This is a list of the screening recommended for you and due dates:  Health Maintenance  Topic Date Due   Zoster (Shingles) Vaccine (1 of 2) Never done   Mammogram  07/02/2021   COVID-19 Vaccine (4 - 2023-24 season) 10/10/2021   Flu Shot  09/10/2022   Medicare Annual Wellness Visit  10/07/2023   DTaP/Tdap/Td vaccine (2 - Td or Tdap) 07/08/2025   Pneumonia Vaccine  Completed   DEXA scan (bone density measurement)  Completed   HPV Vaccine  Aged Out   Colon Cancer Screening  Discontinued   Hepatitis C Screening  Discontinued    Advanced directives: (ACP Link)Information on Advanced Care Planning can be found at [facility] Advance Health Care Directives Advance Health Care Directives ([email or web address]   Next Medicare Annual Wellness Visit scheduled for next year: Yes, 10/13/23 @ 9:45am

## 2022-10-07 NOTE — Progress Notes (Addendum)
Subjective:   Chloe Perkins is a 77 y.o. female who presents for Medicare Annual (Subsequent) preventive examination.  Visit Complete: Virtual  I connected with  Chloe Perkins on 10/07/22 by a audio enabled telemedicine application and verified that I am speaking with the correct person using two identifiers.  Patient Location: Home  Provider Location: Home Office  I discussed the limitations of evaluation and management by telemedicine. The patient expressed understanding and agreed to proceed.  Vital Signs: Because this visit was a virtual/telehealth visit, some criteria may be missing or patient reported. Any vitals not documented were not able to be obtained and vitals that have been documented are patient reported.    Review of Systems     Cardiac Risk Factors include: advanced age (>46men, >78 women);hypertension;dyslipidemia;Other (see comment), Risk factor comments: CAD     Objective:    Today's Vitals   10/07/22 1031  Weight: 106 lb (48.1 kg)  Height: 5\' 3"  (1.6 m)   Body mass index is 18.78 kg/m.     10/07/2022   10:41 AM 09/13/2021    7:03 PM 02/21/2021   10:37 AM 02/21/2020    9:35 AM 10/03/2018    4:24 PM 03/23/2016    1:13 PM  Advanced Directives  Does Patient Have a Medical Advance Directive? Yes;No No Yes Yes No No  Type of Surveyor, minerals;Living will Healthcare Power of [employer];Living will    Does patient want to make changes to medical advance directive? Yes (MAU/Ambulatory/Procedural Areas - Information given)  No - Patient declined No - Patient declined  Yes (MAU/Ambulatory/Procedural Areas - Information given)  Copy of Healthcare Power of Attorney in Chart?   No - copy requested No - copy requested    Would patient like information on creating a medical advance directive?     Yes (ED - Information included in AVS) No - Patient declined    Current Medications (verified) Outpatient Encounter Medications as of  10/07/2022  Medication Sig   amLODipine (NORVASC) 5 MG tablet TAKE 1 TABLET(5 MG) BY MOUTH DAILY   buPROPion (WELLBUTRIN XL) 150 MG 24 hr tablet Take 450 mg by mouth daily.   calcium-vitamin D (OSCAL WITH D) 500-200 MG-UNIT per tablet Take 1 tablet by mouth 2 (two) times daily.   LORazepam (ATIVAN) 0.5 MG tablet Take 0.5 mg by mouth See admin instructions. TAKE 1 TABLET (0.5 MG) SCHEDULED IN THE MORNING & 1 TABLET (0.5 MG) IN THE EVENING AS NEEDED FOR ANXIETY.   losartan (COZAAR) 100 MG tablet Take 1 tablet (100 mg total) by mouth daily.   tiZANidine (ZANAFLEX) 2 MG tablet  (Patient not taking: Reported on 10/07/2022)   No facility-administered encounter medications on file as of 10/07/2022.    Allergies (verified) Codeine, Macrobid [nitrofurantoin], Metoprolol, Adhesive [tape], Alendronate sodium, and Verapamil   History: Past Medical History:  Diagnosis Date   Anxiety and depression    Arthritis    Asthmatic bronchitis    Atypical nevus 01/27/2011   right side abdomen-severe atypia   Atypical nevus 11/09/2012   moderate atypia-left upper back   Atypical nevus 11/19/2012   moderate atypia-left forearm   BCC (basal cell carcinoma) 10/23/2002   left bridge nose-Bcc with sclerosis   Bronchitis    history of   Cancer (HCC)    MELANOMA   Depression    Dysrhythmia    Heart murmur    Hemorrhoids    History of melanoma  HLD (hyperlipidemia)    Hypertension    Melanoma (HCC) 10/18/1997   right post sholder-melanoma level 1   Mitral valve prolapse    Osteoporosis    Personal history of malignant melanoma of skin    Superficial basal cell carcinoma (BCC) 01/10/2013   right cheek   SVT (supraventricular tachycardia)    SVT (supraventricular tachycardia) 05/29/2013   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   Viral gastroenteritis    Past Surgical History:  Procedure Laterality Date    ABDOMINAL HYSTERECTOMY  1975   CATARACT EXTRACTION W/PHACO Right 02/10/2017   Procedure: CATARACT EXTRACTION PHACO AND INTRAOCULAR LENS PLACEMENT (IOC);  Surgeon: Galen Manila, MD;  Location: [facility] ORS;  Service: Ophthalmology;  Laterality: Right;  [place] 00:35.9 AP% 11.5 CDE 4.11 Fluid Pack Lot # [id number]   CATARACT EXTRACTION W/PHACO Left 03/09/2017   Procedure: CATARACT EXTRACTION PHACO AND INTRAOCULAR LENS PLACEMENT (IOC);  Surgeon: Galen Manila, MD;  Location: [facility] ORS;  Service: Ophthalmology;  Laterality: Left;  [place] 00:23 AP% 16.3 CDE 3.85 Fluid pack lot # [id number]   COLONOSCOPY  04/15/2004   2006: Normal   ENDOMETRIAL ABLATION  1976   left thumb tendon repair  10/09   MELANOMA EXCISION  1999   superficial spreading melanoma right post shoulder   NM MYOCAR PERF WALL MOTION  12/28/2008   No ischemia   SKIN CANCER EXCISION  03/02/2013   superficial basal cell cancer   US ECHOCARDIOGRAPHY  10/16/2010   mitral valve leaflets midly thickened,trace MR.   WRIST SURGERY Left 2011   Family History  Problem Relation Age of Onset   Rheum arthritis Mother    Stroke Mother    Heart disease Mother        Father, brother, sister   Lung cancer Father    Emphysema Father        PGF, brother, sister   COPD Father        PGF, brother, sister   Heart disease Father    Heart disease Sister    Alzheimer's disease Sister    Heart disease Brother    Ovarian cancer Paternal Aunt    Lung cancer Paternal Grandfather    Breast cancer Neg Hx    Social History   Socioeconomic History   Marital status: Married    Spouse name: Chloe Perkins   Number of children: 1   Years of education: Not on file   Highest education level: Not on file  Occupational History   Occupation: Retired  Tobacco Use   Smoking status: Former    Current packs/day: 0.00    Average packs/day: 0.5 packs/day for 20.0 years (10.0 ttl pk-yrs)    Types: Cigarettes    Start date: 02/09/1970    Quit date: 02/09/1990     Years since quitting: 32.6   Smokeless tobacco: Never  Vaping Use   Vaping status: Never Used  Substance and Sexual Activity   Alcohol use: No    Alcohol/week: 0.0 standard drinks of alcohol   Drug use: No   Sexual activity: Yes  Other Topics Concern   Not on file  Social History Narrative   She lives her family, she has one child, retired, no smoke no drink   Social Determinants of Corporate investment banker Strain: Low Risk  (10/07/2022)   Overall Financial Resource Strain (CARDIA)    Difficulty of Paying Living Expenses: Not hard at all  Food Insecurity: No Food Insecurity (10/07/2022)   Hunger Vital Sign    Worried About Running Out of Food in the Last Year: Never true    Ran Out of Food in the Last Year: Never true  Transportation Needs: No Transportation Needs (10/07/2022)   PRAPARE - Administrator, Civil Service (Medical): No    Lack of Transportation (Non-Medical): No  Physical Activity: Sufficiently Active (10/07/2022)   Exercise Vital Sign    Days of Exercise per Week: 7 days    Minutes of Exercise per Session: 60 min  Stress: No Stress Concern Present (10/07/2022)   [employer] of Occupational Health - Occupational Stress Questionnaire    Feeling of Stress : Not at all  Social Connections: Moderately Integrated (10/07/2022)   Social Connection and Isolation Panel [NHANES]    Frequency of Communication with Friends and Family: More than three times a week    Frequency of Social Gatherings with Friends and Family: Once a week    Attends Religious Services: 1 to 4 times per year    Active Member of [employer] or Organizations: No    Attends Engineer, structural: Never    Marital Status: Married    Tobacco Counseling Counseling given: Not Answered   Clinical Intake:  Pre-visit preparation completed: Yes  Pain : No/denies pain     BMI - recorded: 18.78 Nutritional Status: BMI <19  Underweight Nutritional Risks: None Diabetes: No  How  often do you need to have someone help you when you read instructions, pamphlets, or other written materials from your doctor or pharmacy?: 1 - Never  Interpreter Needed?: No  Information entered by :: Tora Kindred, CMA   Activities of Daily Living    10/07/2022   10:33 AM  In your present state of health, do you have any difficulty performing the following activities:  Hearing? 0  Vision? 0  Difficulty concentrating or making decisions? 0  Walking or climbing stairs? 0  Dressing or bathing? 0  Doing errands, shopping? 0  Preparing Food and eating ? N  Using the Toilet? N  In the past six months, have you accidently leaked urine? N  Do you have problems with loss of bowel control? N  Managing your Medications? N  Managing your Finances? N  Housekeeping or managing your Housekeeping? N    Patient Care Team: Sherlene Shams, MD as PCP - General (Internal Medicine) Lennette Bihari, MD as PCP - Cardiology (Cardiology) Iva Boop, MD as Consulting Physician (Gastroenterology) Linus Salmons, MD (Unknown Physician Specialty)  Indicate any recent Medical Services you may have received from other than [facility] providers in the past year (date may be approximate).     Assessment:   This is a routine wellness examination for [place].  Hearing/Vision screen Hearing Screening - Comments:: Denies hearing loss Vision Screening - Comments:: Gets routine eye exams  Dietary issues and exercise activities discussed:     Goals Addressed               This Visit's Progress     Patient Stated (pt-stated)        Eat more vegetables      Depression Screen    10/07/2022   10:39 AM 04/30/2022   11:00 AM 02/21/2021   10:36 AM 03/20/2020    3:39 PM 02/21/2020    9:36 AM 03/23/2016    1:16 PM 03/18/2015    9:39 AM  PHQ 2/9 Scores  PHQ - 2  Score 0  0 0 0 0 0  PHQ- 9 Score 0        Exception Documentation  Patient refusal         Fall Risk    10/07/2022   10:43 AM 04/30/2022    11:00 AM 02/21/2021   10:38 AM 03/20/2020    3:39 PM 02/21/2020    9:36 AM  Fall Risk   Falls in the past year? 0 0 0 0 0  Number falls in past yr: 0 0 0 0 0  Injury with Fall? 0 0 0 0 0  Risk for fall due to : No Fall Risks No Fall Risks     Follow up Falls prevention discussed Falls evaluation completed Falls evaluation completed Falls evaluation completed Falls evaluation completed    MEDICARE RISK AT HOME: Medicare Risk at Home Any stairs in or around the home?: Yes If so, are there any without handrails?: No Home free of loose throw rugs in walkways, pet beds, electrical cords, etc?: Yes Adequate lighting in your home to reduce risk of falls?: Yes Life alert?: No Use of a cane, walker or w/c?: No Grab bars in the bathroom?: Yes Shower chair or bench in shower?: No Elevated toilet seat or a handicapped toilet?: No  TIMED UP AND GO:  Was the test performed?  No    Cognitive Function:    03/23/2016    1:20 PM  MMSE - Mini Mental State Exam  Orientation to time 5  Orientation to Place 5  Registration 3  Attention/ Calculation 5  Recall 3  Language- name 2 objects 2  Language- repeat 1  Language- follow 3 step command 3  Language- read & follow direction 1  Write a sentence 1  Copy design 1  Total score 30        10/07/2022   10:44 AM 02/21/2021   10:39 AM  6CIT Screen  What Year? 0 points 0 points  What month? 0 points 0 points  What time? 0 points 0 points  Count back from 20 0 points   Months in reverse 2 points 0 points  Repeat phrase 0 points   Total Score 2 points     Immunizations Immunization History  Administered Date(s) Administered   Fluad Quad(high Dose 65+) 11/24/2018   Influenza Split 11/27/2010, 11/05/2011, 12/09/2012, 11/09/2013   Influenza Whole 02/09/2009   Influenza, High Dose Seasonal PF 11/11/2015, 12/27/2017   Influenza-Unspecified 12/08/2014   PFIZER(Purple Top)SARS-COV-2 Vaccination 03/13/2019, 04/03/2019, 12/11/2019    Pneumococcal Conjugate-13 03/18/2015   Pneumococcal Polysaccharide-23 12/10/2011   Tdap 07/09/2015    TDAP status: Up to date  Flu Vaccine status: Due, Education has been provided regarding the importance of this vaccine. Advised may receive this vaccine at local pharmacy or Health Dept. Aware to provide a copy of the vaccination record if obtained from local pharmacy or Health Dept. Verbalized acceptance and understanding.  Pneumococcal vaccine status: Up to date  Covid-19 vaccine status: Information provided on how to obtain vaccines.   Qualifies for Shingles Vaccine? Yes   Zostavax completed No   Shingrix Completed?: No.    Education has been provided regarding the importance of this vaccine. Patient has been advised to call insurance company to determine out of pocket expense if they have not yet received this vaccine. Advised may also receive vaccine at local pharmacy or Health Dept. Verbalized acceptance and understanding.  Screening Tests Health Maintenance  Topic Date Due   Zoster Vaccines- Shingrix (1 of  2) Never done   MAMMOGRAM  07/02/2021   COVID-19 Vaccine (4 - 2023-24 season) 10/10/2021   INFLUENZA VACCINE  09/10/2022   Medicare Annual Wellness (AWV)  10/07/2023   DTaP/Tdap/Td (2 - Td or Tdap) 07/08/2025   Pneumonia Vaccine 62+ Years old  Completed   DEXA SCAN  Completed   HPV VACCINES  Aged Out   Colonoscopy  Discontinued   Hepatitis C Screening  Discontinued    Health Maintenance  Health Maintenance Due  Topic Date Due   Zoster Vaccines- Shingrix (1 of 2) Never done   MAMMOGRAM  07/02/2021   COVID-19 Vaccine (4 - 2023-24 season) 10/10/2021   INFLUENZA VACCINE  09/10/2022    Colorectal cancer screening: No longer required.   Mammogram status: Completed 07/02/20. Repeat every year Order placed  Bone Density status: Ordered 10/07/22. Pt provided with contact info and advised to call to schedule appt.  Lung Cancer Screening: (Low Dose CT Chest recommended  if Age 11-80 years, 20 pack-year currently smoking OR have quit w/in 15years.) does not qualify.   Lung Cancer Screening Referral: n/a  Additional Screening:  Hepatitis C Screening: does qualify; Completed patient declined  Vision Screening: Recommended annual ophthalmology exams for early detection of glaucoma and other disorders of the eye.  Dental Screening: Recommended annual dental exams for proper oral hygiene  Community Resource Referral / Chronic Care Management: CRR required this visit?  No   CCM required this visit?  No     Plan:     I have personally reviewed and noted the following in the patient's chart:   Medical and social history Use of alcohol, tobacco or illicit drugs  Current medications and supplements including opioid prescriptions. Patient is not currently taking opioid prescriptions. Functional ability and status Nutritional status Physical activity Advanced directives List of other physicians Hospitalizations, surgeries, and ER visits in previous 12 months Vitals Screenings to include cognitive, depression, and falls Referrals and appointments  In addition, I have reviewed and discussed with patient certain preventive protocols, quality metrics, and best practice recommendations. A written personalized care plan for preventive services as well as general preventive health recommendations were provided to patient.     Tora Kindred, CMA   10/07/2022   After Visit Summary: (Mail) Due to this being a telephonic visit, the after visit summary with patients personalized plan was offered to patient via mail   Nurse Notes:  6 CIT Score - 2 Will get flu shot in the fall Declined Hep C Recommended shingles vaccine Placed orders for MMG and DEXA scan Make OV for 12/16/22 for routine follow up    I have reviewed the above information and agree with above.   Duncan Dull, MD

## 2022-10-26 ENCOUNTER — Ambulatory Visit: Payer: Medicare Other

## 2022-11-02 ENCOUNTER — Ambulatory Visit: Payer: Medicare Other

## 2022-11-04 ENCOUNTER — Telehealth: Payer: Self-pay | Admitting: Cardiovascular Disease

## 2022-11-04 DIAGNOSIS — I10 Essential (primary) hypertension: Secondary | ICD-10-CM

## 2022-11-04 DIAGNOSIS — E785 Hyperlipidemia, unspecified: Secondary | ICD-10-CM

## 2022-11-04 NOTE — Telephone Encounter (Signed)
Tried to reach patient.  Unable to reach patient phone just rings x3

## 2022-11-04 NOTE — Telephone Encounter (Signed)
Routed to [facility] CMA

## 2022-11-04 NOTE — Telephone Encounter (Signed)
Putting in orders now

## 2022-11-04 NOTE — Telephone Encounter (Signed)
Patient would like labs for her and husband prior to appt.  See past Labs from Dr Tresa Endo as CMP, CBC, LIPID, TSH.  Do you want these for both her and husband?  Also want the orders faxed to [facility]

## 2022-11-04 NOTE — Telephone Encounter (Signed)
Patient called to get order for her and her husband's lab work to be done prior to their appointments on 10/1.  Patient stated they want to go to the [facility] to have blood drawn

## 2022-11-05 ENCOUNTER — Other Ambulatory Visit
Admission: RE | Admit: 2022-11-05 | Discharge: 2022-11-05 | Disposition: A | Discharge status: Home / Self Care | Payer: Medicare Other | Attending: Cardiovascular Disease | Admitting: Cardiovascular Disease

## 2022-11-05 ENCOUNTER — Ambulatory Visit: Payer: Medicare Other

## 2022-11-05 ENCOUNTER — Ambulatory Visit (INDEPENDENT_AMBULATORY_CARE_PROVIDER_SITE_OTHER): Payer: Medicare Other | Admitting: Dermatology

## 2022-11-05 ENCOUNTER — Encounter: Payer: Self-pay | Admitting: Dermatology

## 2022-11-05 VITALS — BP 135/84

## 2022-11-05 DIAGNOSIS — L570 Actinic keratosis: Secondary | ICD-10-CM

## 2022-11-05 DIAGNOSIS — E785 Hyperlipidemia, unspecified: Secondary | ICD-10-CM | POA: Insufficient documentation

## 2022-11-05 DIAGNOSIS — L209 Atopic dermatitis, unspecified: Secondary | ICD-10-CM

## 2022-11-05 DIAGNOSIS — L821 Other seborrheic keratosis: Secondary | ICD-10-CM

## 2022-11-05 DIAGNOSIS — Z85820 Personal history of malignant melanoma of skin: Secondary | ICD-10-CM

## 2022-11-05 DIAGNOSIS — I10 Essential (primary) hypertension: Secondary | ICD-10-CM | POA: Diagnosis present

## 2022-11-05 DIAGNOSIS — W908XXA Exposure to other nonionizing radiation, initial encounter: Secondary | ICD-10-CM

## 2022-11-05 DIAGNOSIS — L820 Inflamed seborrheic keratosis: Secondary | ICD-10-CM

## 2022-11-05 DIAGNOSIS — L729 Follicular cyst of the skin and subcutaneous tissue, unspecified: Secondary | ICD-10-CM

## 2022-11-05 LAB — COMPREHENSIVE METABOLIC PANEL
ALT: 13 U/L (ref 0–44)
AST: 23 U/L (ref 15–41)
Albumin: 3.9 g/dL (ref 3.5–5.0)
Alkaline Phosphatase: 65 U/L (ref 38–126)
Anion gap: 11 (ref 5–15)
BUN: 17 mg/dL (ref 8–23)
CO2: 27 mmol/L (ref 22–32)
Calcium: 9.8 mg/dL (ref 8.9–10.3)
Chloride: 101 mmol/L (ref 98–111)
Creatinine, Ser: 1.03 mg/dL — ABNORMAL HIGH (ref 0.44–1.00)
GFR, Estimated: 56 mL/min — ABNORMAL LOW (ref >60)
Glucose, Bld: 94 mg/dL (ref 70–99)
Potassium: 3.9 mmol/L (ref 3.5–5.1)
Sodium: 139 mmol/L (ref 135–145)
Total Bilirubin: 0.7 mg/dL (ref 0.3–1.2)
Total Protein: 7 g/dL (ref 6.5–8.1)

## 2022-11-05 LAB — CBC
HCT: 40.9 % (ref 36.0–46.0)
Hemoglobin: 13.8 g/dL (ref 12.0–15.0)
MCH: 32 pg (ref 26.0–34.0)
MCHC: 33.7 g/dL (ref 30.0–36.0)
MCV: 94.9 fL (ref 80.0–100.0)
Platelets: 188 10*3/uL (ref 150–400)
RBC: 4.31 MIL/uL (ref 3.87–5.11)
RDW: 11.9 % (ref 11.5–15.5)
WBC: 4.8 10*3/uL (ref 4.0–10.5)
nRBC: 0 % (ref 0.0–0.2)

## 2022-11-05 LAB — LIPID PANEL
Cholesterol: 197 mg/dL (ref 0–200)
HDL: 83 mg/dL (ref >40)
LDL Cholesterol: 104 mg/dL — ABNORMAL HIGH (ref 0–99)
Total CHOL/HDL Ratio: 2.4 RATIO
Triglycerides: 51 mg/dL (ref <150)
VLDL: 10 mg/dL (ref 0–40)

## 2022-11-05 LAB — TSH: TSH: 3.96 u[IU]/mL (ref 0.350–4.500)

## 2022-11-05 MED ORDER — TRIAMCINOLONE ACETONIDE 0.1 % EX CREA: 1.0000 | TOPICAL_CREAM | CUTANEOUS | 1 refills | Status: DC

## 2022-11-05 NOTE — Progress Notes (Signed)
Follow-Up Visit   Subjective  Chloe Perkins is a 77 y.o. female who presents for the following: check spots bil arms/hands 26m, blister like, itchy prn, benadryl cr for itching, check spot L ant thigh, just noticed, hx of Melanoma 1999 The patient has spots, moles and lesions to be evaluated, some may be new or changing and the patient may have concern these could be cancer.   The following portions of the chart were reviewed this encounter and updated as appropriate: medications, allergies, medical history  Review of Systems:  No other skin or systemic complaints except as noted in HPI or Assessment and Plan.  Objective  Well appearing patient in no apparent distress; mood and affect are within normal limits.   A focused examination was performed of the following areas: Arms, hands, left leg  Relevant exam findings are noted in the Assessment and Plan.  Left Forearm Stuck on waxy paps with erythema  R dorsal hand Pink scaly macules    Assessment & Plan   SEBORRHEIC KERATOSIS L ant thigh Exam: Stuck on waxy pap L ant thigh   Treatment Plan: Benign-appearing.  Observation.  Call clinic for new or changing moles.  Recommend daily use of broad spectrum spf 30+ sunscreen to sun-exposed areas.    Inflamed seborrheic keratosis Left Forearm  No treatment today  AK (actinic keratosis) R dorsal hand  No treatment today, observe  Actinic keratoses are precancerous spots that appear secondary to cumulative UV radiation exposure/sun exposure over time. They are chronic with expected duration over 1 year. A portion of actinic keratoses will progress to squamous cell carcinoma of the skin. It is not possible to reliably predict which spots will progress to skin cancer and so treatment is recommended to prevent development of skin cancer.  Recommend daily broad spectrum sunscreen SPF 30+ to sun-exposed areas, reapply every 2 hours as needed.  Recommend staying in the shade  or wearing long sleeves, sun glasses (UVA+UVB protection) and wide brim hats (4-inch brim around the entire circumference of the hat). Call for new or changing lesions.  ATOPIC DERMATITIS  Exam: Pink slightly scaly plaque b/l medial forearms  Chronic and persistent condition with duration or expected duration over one year. Condition is bothersome/symptomatic for patient. Currently flared.   Atopic dermatitis (eczema) is a chronic, relapsing, pruritic condition that can significantly affect quality of life. It is often associated with allergic rhinitis and/or asthma and can require treatment with topical medications, phototherapy, or in severe cases biologic injectable medication (Dupixent; Adbry) or Oral JAK inhibitors.  Treatment: Start TMC 0.1% cr bid aa eczema on arm until clear then prn flares, avoid f/g/a  Topical steroids (such as triamcinolone, fluocinolone, fluocinonide, mometasone, clobetasol, halobetasol, betamethasone, hydrocortisone) can cause thinning and lightening of the skin if they are used for too long in the same area. Your physician has selected the right strength medicine for your problem and area affected on the body. Please use your medication only as directed by your physician to prevent side effects.    CYST, mucous cyst vs EIC vs other Exam: firm translucent pap R antecubital fossa  Benign-appearing. Exam most consistent with an epidermal inclusion cyst. Discussed that a cyst is a benign growth that can grow over time and sometimes get irritated or inflamed. Recommend observation if it is not bothersome. Discussed option of surgical excision to remove it if it is growing, symptomatic, or other changes noted. Please call for new or changing lesions so they can be  evaluated.   Return for as scheduled with Dr. Kirtland Bouchard for TBSE, Hx of Melanoma, Hx of BCC, Hx of AKs.  I, Ardis Rowan, RMA, am acting as scribe for Elie Goody, MD .   Documentation: I have reviewed  the above documentation for accuracy and completeness, and I agree with the above.  Elie Goody, MD

## 2022-11-05 NOTE — Patient Instructions (Signed)

## 2022-11-09 ENCOUNTER — Ambulatory Visit (INDEPENDENT_AMBULATORY_CARE_PROVIDER_SITE_OTHER): Payer: Medicare Other

## 2022-11-09 DIAGNOSIS — E538 Deficiency of other specified B group vitamins: Secondary | ICD-10-CM

## 2022-11-09 MED ORDER — CYANOCOBALAMIN 1000 MCG/ML IJ SOLN
1000.0000 ug | Freq: Once | INTRAMUSCULAR | Status: AC
Start: 2022-11-09 — End: 2022-11-09
  Administered 2022-11-09: 1000 ug via INTRAMUSCULAR

## 2022-11-09 NOTE — Progress Notes (Signed)
Pt presented for their vitamin B12 injection. Pt was identified through two identifiers. Pt tolerated shot well in their left  deltoid.  

## 2022-11-10 ENCOUNTER — Encounter: Payer: Self-pay | Admitting: Cardiovascular Disease

## 2022-11-10 ENCOUNTER — Ambulatory Visit: Payer: Medicare Other | Attending: Cardiovascular Disease | Admitting: Cardiovascular Disease

## 2022-11-10 VITALS — BP 144/94 | HR 80 | Ht 63.0 in | Wt 104.2 lb

## 2022-11-10 DIAGNOSIS — I2510 Atherosclerotic heart disease of native coronary artery without angina pectoris: Secondary | ICD-10-CM | POA: Diagnosis present

## 2022-11-10 DIAGNOSIS — F419 Anxiety disorder, unspecified: Secondary | ICD-10-CM | POA: Insufficient documentation

## 2022-11-10 DIAGNOSIS — I471 Supraventricular tachycardia, unspecified: Secondary | ICD-10-CM | POA: Insufficient documentation

## 2022-11-10 DIAGNOSIS — E785 Hyperlipidemia, unspecified: Secondary | ICD-10-CM | POA: Diagnosis not present

## 2022-11-10 DIAGNOSIS — I6529 Occlusion and stenosis of unspecified carotid artery: Secondary | ICD-10-CM | POA: Insufficient documentation

## 2022-11-10 DIAGNOSIS — I10 Essential (primary) hypertension: Secondary | ICD-10-CM | POA: Diagnosis not present

## 2022-11-10 DIAGNOSIS — F32A Depression, unspecified: Secondary | ICD-10-CM | POA: Insufficient documentation

## 2022-11-10 MED ORDER — LORAZEPAM 0.5 MG PO TABS: 0.5000 mg | ORAL_TABLET | ORAL | 5 refills | Status: DC

## 2022-11-10 MED ORDER — AMLODIPINE BESYLATE 5 MG PO TABS: ORAL_TABLET | ORAL | 3 refills | Status: DC

## 2022-11-10 MED ORDER — LOSARTAN POTASSIUM 100 MG PO TABS: 100.0000 mg | ORAL_TABLET | Freq: Every day | ORAL | 3 refills | Status: DC

## 2022-11-10 NOTE — Progress Notes (Unsigned)
Patient ID: Chloe Perkins, female   DOB: 04-22-1945, 77 y.o.   MRN: [id number]    Primary M.D.: Dr. Duncan Dull  HPI: Chloe Perkins is a 77 y.o. female who presents to the office for a 23  month cardiology follow-up evaluation.  [name] has a history of SVT and hypertension, for which she has been on diltiazem at 120 mg  and losartan 50 mg daily.  She has  also remote history of thyroid abnormality and in the past  had taken levothyroxine.  She has mild renal insufficiency with stage III renal disease.  Has a history of anxiety/depression for which she takes Wellbutrin XL 300 mg daily.  She also is on calcium with vitamin D in light of osteoporosis.  Recently, she believes that she has been experiencing more episodes of palpitations which are short-lived and at times occur daily.  She denies associated presyncope or syncope.  She denies chest pain.  She recently underwent a panoramix dental x-ray and was told by her dentist that she may have calcification of her carotids.   She has a history of osteoporosis, as well as vitamin B12 deficiency and has previously been found to have stable solitary pulmonary nodule.  She had a follow-up echo Doppler study in November 2016 which showed an ejection fraction at 60-65%.  She had normal diastolic parameters.  There was turbulent flow at the base of her aortic valve in the region of the right coronary cusp which was most likely the cause of her systolic murmur.    I last saw her, she was experiencing  transient palpitations lasting less than 5 seconds, almost on a daily basis.  She has been using caffeine.  She underwent a carotid duplex study in July 2017 which showed heterogeneous plaque bilaterally and 1-39% bilateral ICA stenoses with normal subclavian arteries, and patent vertebral arteries with antegrade flow.  I saw her, I further titrated her Cardizem and suggested that she take 240 mg at bedtime.  She's continued to take losartan 75 mg in  the morning.  Since I  saw her in February 2018, she states that she has continued to experience short-lived episodes of asymptomatic palpitations which may last anywhere from 5-10 seconds, but seemed to occur on a daily basis.  She has been on slow acting diltiazem at 240 mg daily and has continued to take losartan 75 mg daily for hypertension.  She also is on Ativan and Wellbutrin.   She underwent a CT scan by Dr. Duncan Dull.  She was found to have a stable 1 cm nodule in the right lower lobe that was unchanged from previously.  Incidentally, she was also noted to have coronary artery calcification suggestive of CAD.  Once I found out about this, I had contacted her and recommended initiation of Crestor at an initial very low dose of 10 mg.  Since she was having almost daily palpitations when I saw her several weeks ago  I also attempted to add very low-dose metoprolol succinate at 12.5 mg.  Her heart rate had dropped into the upper 40s with this and she stopped taking it; however, toprol did improve her daily palpitations.    When I saw her in October 2018 I had a lengthy discussion with her regarding the fact that she has demonstration of subclinical atherosis in the importance of trying to induce plaque regression.  After much discussion, she agreed to initiate low-dose statin therapy.  She was started on low-dose Crestor.  Apparently, she self discontinued this since she felt this caused her to be nauseated.  On for brief 14 2018.  Repeat laboratory off treatment showed a total cholesterol 2:15, HDL 87, LDL 114, and triglycerides 71.    When I saw her in March 2019 she had discontinued Crestor because of feeling nauseated which she attributed to Crestor and I initiated Zetia.  She saw Corine Shelter on Jul 06, 2017 as an add-on for slow heart rate.  Heart rate on his exam was 48.  He decreased diltiazem back to 120 mg daily   Apparently, [name] did not feel well on the 120 of diltiazem and for this  reason went back to take her previous 180 mg dose.  She had noticed her recent pressure elevation and also had noticed a mild headache.  She took the reduced dose ifor 5 days and then went back to 180 mg with feeling improved.    When I saw her on July 29, 2017 for follow-up her blood pressure was elevated on losartan 100 mg daily.  At that time I suggested she discontinue diltiazem and changed her to metoprolol tartrate 25 mg twice a day depending upon her heart rate.  Her ECG during that evaluation showed a bigeminal rhythm with alternating sinus and low atrial beats.  She was tolerating Zetia for hyperlipidemia in light of her documentation of subclinical atherosclerosis.  Apparently, she had called the office and was having issues with elevated blood pressure and slow heart rate.  During my absence, her metoprolol was stopped and she was started on amlodipine 5 mg daily and to use the metoprolol 12.5 mg on an as-needed basis.  She wore a  Monitor for 5 days saw Dr. Graciela Husbands for follow-up evaluation.  She is unaware of any recurrent episodes of tachycardia.  She denies presyncope or syncope.  I saw her in March 2020 at which time she was doing well and denied any recurrent episodes of palpitations.  She was  on amlodipine 5 mg in addition to losartan 100 mg hypertension.  I had started her on Zetia for hyperlipidemia but apparently she was not taking this.. She continued to take bupropion 450 mg for depression.  She had seen Dr. Duncan Dull for annual exam and laboratory from that date was reviewed.  Recent lipid panel in November 2019 was 89 with an HDL of 85.    I saw her in December 2020 at which time she felt that she was stable from a cardiac standpoint.  She denied any chest pain but had noted some shortness of breath with step walking.    Since my evaluation she was seen by Dr. Graciela Husbands on February 09, 2019.  Pulse was 59 and blood pressure was stable.  She had documented PACs and he reassured her that  her PACs were benign.  There was no documentation of atrial fibrillation.   She was evaluated by me in a telemedicine visit on August 02, 2019. Over the prior 6 months, she had experienced some episodes of shortness of breath with activity.  She continued to take Wellbutrin for depression.  She underwent evaluation of thyroid function and TSH was 6.7.  Presently she denies chest pain or shortness of breath.  She had not had recent labs.  She had not had a recent echo since 2016 and with her exertional dyspnea I have recommended that she undergo a follow-up echo evaluation.  She underwent an echo Doppler study on August 25 2019.  This  showed an EF of 60 to 65%.  Diastolic parameters were normal.  She had normal pulmonary artery systolic pressure.  I saw her in follow-up on December 05, 2019 at which time she felt well.  She was not exercising and was having bar spine issues.  Several weeks previously she had undergone gone on a steroid injection with some improvement.  She is unaware of any recurrent palpitations.    I last saw her on December 11, 2020. She has remained stable.  She had laboratory drawn today which was not yet available for this office visit.  Her blood pressures at home typically run in the 120s with diastolics in the 70s.  She has continued to be on amlodipine 5 mg and losartan 100 mg for hypertension.  She is on bupropion 450 mg daily for depression and takes as needed lorazepam.  She sees Dr. Duncan Dull for primary care.    She was evaluated by Oris Drone, NP on December 08, 2021.   Past Medical History:  Diagnosis Date   Actinic keratosis    Anxiety and depression    Arthritis    Asthmatic bronchitis    Atypical nevus 01/27/2011   right side abdomen-severe atypia   Atypical nevus 11/09/2012   moderate atypia-left upper back   Atypical nevus 11/19/2012   moderate atypia-left forearm   BCC (basal cell carcinoma) 10/23/2002   left bridge nose-Bcc with sclerosis   Bronchitis     history of   Cancer (HCC)    MELANOMA   Depression    Dysrhythmia    Heart murmur    Hemorrhoids    History of melanoma    HLD (hyperlipidemia)    Hypertension    Melanoma (HCC) 10/18/1997   right post sholder-melanoma level 1   Mitral valve prolapse    Osteoporosis    Personal history of malignant melanoma of skin    Superficial basal cell carcinoma (BCC) 01/10/2013   right cheek   SVT (supraventricular tachycardia) (HCC)    SVT (supraventricular tachycardia) (HCC) 05/29/2013   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   Viral gastroenteritis     Past Surgical History:  Procedure Laterality Date   ABDOMINAL HYSTERECTOMY  1975   CATARACT EXTRACTION W/PHACO Right 02/10/2017   Procedure: CATARACT EXTRACTION PHACO AND INTRAOCULAR LENS PLACEMENT (IOC);  Surgeon: Galen Manila, MD;  Location: [facility] ORS;  Service: Ophthalmology;  Laterality: Right;  [place] 00:35.9 AP% 11.5 CDE 4.11 Fluid Pack Lot # [id number]   CATARACT EXTRACTION W/PHACO Left 03/09/2017   Procedure: CATARACT EXTRACTION PHACO AND INTRAOCULAR LENS PLACEMENT (IOC);  Surgeon: Galen Manila, MD;  Location: [facility] ORS;  Service: Ophthalmology;  Laterality: Left;  [place] 00:23 AP% 16.3 CDE 3.85 Fluid pack lot # [id number]   COLONOSCOPY  04/15/2004   2006: Normal   ENDOMETRIAL ABLATION  1976   left thumb tendon repair  10/09   MELANOMA EXCISION  1999   superficial spreading melanoma right post shoulder   NM MYOCAR PERF WALL MOTION  12/28/2008   No ischemia   SKIN CANCER EXCISION  03/02/2013   superficial basal cell cancer   US ECHOCARDIOGRAPHY  10/16/2010   mitral valve leaflets midly thickened,trace MR.   WRIST SURGERY Left 2011    Allergies  Allergen Reactions   Codeine Nausea And Vomiting   Macrobid [Nitrofurantoin]     Upsets stomach.   Metoprolol     Severe bradycardia  Adhesive [Tape] Rash    PAPER TAPE ONLY  (INTOLERANCE TO LEADS--EKG)   Alendronate Sodium Other (See Comments)    REACTION: pt states INTOL to Fosamax   Verapamil Other (See Comments)    REACTION: Intol w/ bradycardia    Current Outpatient Medications  Medication Sig Dispense Refill   amLODipine (NORVASC) 5 MG tablet TAKE 1 TABLET(5 MG) BY MOUTH DAILY 90 tablet 3   buPROPion (WELLBUTRIN XL) 150 MG 24 hr tablet Take 300 mg by mouth daily.     calcium-vitamin D (OSCAL WITH D) 500-200 MG-UNIT per tablet Take 1 tablet by mouth 2 (two) times daily.     LORazepam (ATIVAN) 0.5 MG tablet Take 0.5 mg by mouth See admin instructions. TAKE 1 TABLET (0.5 MG) SCHEDULED IN THE MORNING & 1 TABLET (0.5 MG) IN THE EVENING AS NEEDED FOR ANXIETY.  5   losartan (COZAAR) 100 MG tablet Take 1 tablet (100 mg total) by mouth daily. 90 tablet 3   triamcinolone cream (KENALOG) 0.1 % Apply 1 Application topically as directed. Bid to aa eczema on wrist and hands until clear, then prn flares, avoid face, groin, axilla 80 g 1   tiZANidine (ZANAFLEX) 2 MG tablet  (Patient not taking: Reported on 10/07/2022)     No current facility-administered medications for this visit.    Social History   Socioeconomic History   Marital status: Married    Spouse name: sammy Kilbourne   Number of children: 1   Years of education: Not on file   Highest education level: Not on file  Occupational History   Occupation: Retired  Tobacco Use   Smoking status: Former    Current packs/day: 0.00    Average packs/day: 0.5 packs/day for 20.0 years (10.0 ttl pk-yrs)    Types: Cigarettes    Start date: 02/09/1970    Quit date: 02/09/1990    Years since quitting: 32.7   Smokeless tobacco: Never  Vaping Use   Vaping status: Never Used  Substance and Sexual Activity   Alcohol use: No    Alcohol/week: 0.0 standard drinks of alcohol   Drug use: No   Sexual activity: Yes  Other Topics Concern   Not on file  Social History Narrative   She lives her family, she has one child,  retired, no smoke no drink   Social Determinants of Corporate investment banker Strain: Low Risk  (10/07/2022)   Overall Financial Resource Strain (CARDIA)    Difficulty of Paying Living Expenses: Not hard at all  Food Insecurity: No Food Insecurity (10/07/2022)   Hunger Vital Sign    Worried About Running Out of Food in the Last Year: Never true    Ran Out of Food in the Last Year: Never true  Transportation Needs: No Transportation Needs (10/07/2022)   PRAPARE - Administrator, Civil Service (Medical): No    Lack of Transportation (Non-Medical): No  Physical Activity: Sufficiently Active (10/07/2022)   Exercise Vital Sign    Days of Exercise per Week: 7 days    Minutes of Exercise per Session: 60 min  Stress: No Stress Concern Present (10/07/2022)   [employer] of Occupational Health - Occupational Stress Questionnaire    Feeling of Stress : Not at all  Social Connections: Moderately Integrated (10/07/2022)   Social Connection and Isolation Panel [NHANES]    Frequency of Communication with Friends and Family: More than three times a week    Frequency of Social Gatherings with Friends and Family:  Once a week    Attends Religious Services: 1 to 4 times per year    Active Member of Clubs or Organizations: No    Attends Banker Meetings: Never    Marital Status: Married  Catering manager Violence: Not At Risk (10/07/2022)   Humiliation, Afraid, Rape, and Kick questionnaire    Fear of Current or Ex-Partner: No    Emotionally Abused: No    Physically Abused: No    Sexually Abused: No    Family History  Problem Relation Age of Onset   Rheum arthritis Mother    Stroke Mother    Heart disease Mother        Father, brother, sister   Lung cancer Father    Emphysema Father        PGF, brother, sister   COPD Father        PGF, brother, sister   Heart disease Father    Heart disease Sister    Alzheimer's disease Sister    Heart disease Brother     Ovarian cancer Paternal Aunt    Lung cancer Paternal Grandfather    Breast cancer Neg Hx     ROS General: Negative; No fevers, chills, or night sweats;  HEENT: Negative; No changes in vision or hearing, sinus congestion, difficulty swallowing Pulmonary: Stable lung nodule Cardiovascular: see HPI GI: Negative; No nausea, vomiting, diarrhea, or abdominal pain GU: Negative; No dysuria, hematuria, or difficulty voiding Musculoskeletal: she complains of chronic back pain; status post recent spinal injection Hematologic/Oncology: Negative; no easy bruising, bleeding Endocrine: Negative; no heat/cold intolerance; no diabetes Neuro: Negative; no changes in balance, headaches Skin: Negative; No rashes or skin lesions Psychiatric: Positive for anxiety/depression on Wellbutrin Sleep: Negative; No snoring, daytime sleepiness, hypersomnolence, bruxism, restless legs, hypnogognic hallucinations, no cataplexy Other comprehensive 14 point system review is negative.   PE BP (!) 144/94   Pulse 80   Ht 5\' 3"  (1.6 m)   Wt 104 lb 3.2 oz (47.3 kg)   SpO2 95%   BMI 18.46 kg/m    Repeat blood pressure by me was 128/70  Wt Readings from Last 3 Encounters:  11/10/22 104 lb 3.2 oz (47.3 kg)  10/07/22 106 lb (48.1 kg)  07/07/22 106 lb (48.1 kg)   General: Alert, oriented, no distress.  Skin: normal turgor, no rashes, warm and dry HEENT: Normocephalic, atraumatic. Pupils equal round and reactive to light; sclera anicteric; extraocular muscles intact; Nose without nasal septal hypertrophy Mouth/Parynx benign; Mallinpatti scale 2 Neck: No JVD, no carotid bruits; normal carotid upstroke Lungs: clear to ausculatation and percussion; no wheezing or rales Chest wall: without tenderness to palpitation Heart: PMI not displaced, RRR, s1 s2 normal, 1/6 systolic murmur, no diastolic murmur, no rubs, gallops, thrills, or heaves Abdomen: soft, nontender; no hepatosplenomehaly, BS+; abdominal aorta nontender and  not dilated by palpation. Back: no CVA tenderness Pulses 2+ Musculoskeletal: full range of motion, normal strength, no joint deformities Extremities: no clubbing cyanosis or edema, Homan's sign negative  Neurologic: grossly nonfocal; Cranial nerves grossly wnl Psychologic: Normal mood and affect  EKG Interpretation Date/Time:  Tuesday November 10 2022 10:33:54 EDT Ventricular Rate:  80 PR Interval:  142 QRS Duration:  80 QT Interval:  390 QTC Calculation: 449 R Axis:   19  Text Interpretation: Normal sinus rhythm with sinus arrhythmia Low voltage QRS Mild RV conduction delay Possible Anterolateral infarct , age undetermined When compared with ECG of 13-Sep-2021 20:46, PREVIOUS ECG IS PRESENT Confirmed by Nicki Guadalajara ([id number])  on 11/10/2022 11:06:53 AM    December 11, 2020  ECG (independently read by me):  Sinus rhythm at 61, sinus arrythmia  December 05, 2019 ECG (independently read by me): NSR at 87; no ectopy; normal intervals  January 10, 2019 ECG (independently read by me): Normal sinus rhythm at 89 bpm.  Low voltage.  Small Q-wave in lead III.  No ectopy.  Normal intervals.  April 11, 2018 ECG (independently read by me): Sinus bradycardia with mild sinus arrhythmia, rate 51.  Low voltage.  Normal intervals.  No ectopy.  August 31, 2017 ECG (independently read by me): Normal sinus rhythm 80 bpm.  No ectopy.  Mild RV conduction delay.  Normal intervals.  July 29, 2017 ECG (independently read by me): Bigeminy rhythm with alternating sinus and low atrial beat heart rate 60 bpm.  May 05, 2017 ECG (independently read by me): normal sinus rhythm at 62 with a PAC.  Low voltage.  Normal intervals.  October 2018 ECG (independently read by me): Sinus rhythm at 65 bpm, PAC.  PR interval 128 ms, QTc interval 428 ms.  September 2018 ECG (independently read by me): Normal sinus rhythm at 64 bpm, PA-C, mild RV conduction delay.  Nonspecific ST changes.  February 2017 ECG (independently read by  me): Sinus bradycardia 53 bpm..  Mild RV conduction delay.  Normal intervals.  November 2017 ECG (independently read by me): Normal sinus rhythm at 61 bpm.  No ectopy.  Normal intervals.  July 2017 ECG (independently read by me): Sinus rhythm at 60 bpm with PAC.  Normal intervals.  November 2016 ECG (independently read by me): Normal sinus rhythm at 66 bpm.  Mild RV conduction delay.  Nondiagnostic small inferior Q waves.  ECG (independently read by me): sinus bradycardia 50 bpm.  Mild RV conduction delay  November 2015 ECG (independently read by me0;  Normal sinus rhythm at 77 bpm.  RV conduction delay.  Nondiagnostic small Q waves.  Nonspecific ST changes.  April 2015 ECG (independently read by me): Sinus bradycardia 54 beats per minute.  Mild RV conduction delay.  No ectopy.  LABS:    Latest Ref Rng & Units 11/05/2022   12:24 PM 09/13/2021    7:23 PM 12/11/2020    8:23 AM  BMP  Glucose 70 - 99 mg/dL 94  956  84   BUN 8 - 23 mg/dL 17  17  16    Creatinine 0.44 - 1.00 mg/dL 2.13  0.86  5.78   BUN/Creat Ratio 12 - 28   15   Sodium 135 - 145 mmol/L 139  138  142   Potassium 3.5 - 5.1 mmol/L 3.9  4.8  4.8   Chloride 98 - 111 mmol/L 101  106  103   CO2 22 - 32 mmol/L 27  25  27    Calcium 8.9 - 10.3 mg/dL 9.8  9.9  46.9       Latest Ref Rng & Units 11/05/2022   12:24 PM 12/11/2020    8:23 AM 12/05/2019    2:36 PM  Hepatic Function  Total Protein 6.5 - 8.1 g/dL 7.0  6.7  6.9   Albumin 3.5 - 5.0 g/dL 3.9  4.4  4.5   AST 15 - 41 U/L 23  21  24    ALT 0 - 44 U/L 13  13  13    Alk Phosphatase 38 - 126 U/L 65  81  87   Total Bilirubin 0.3 - 1.2 mg/dL 0.7  0.5  0.3  Latest Ref Rng & Units 11/05/2022   12:24 PM 09/13/2021    7:23 PM 12/11/2020    8:23 AM  CBC  WBC 4.0 - 10.5 K/uL 4.8  4.6  3.3   Hemoglobin 12.0 - 15.0 g/dL 62.1  30.8  65.7   Hematocrit 36.0 - 46.0 % 40.9  43.4  41.4   Platelets 150 - 400 K/uL 188  209  177    Lab Results  Component Value Date   MCV 94.9  11/05/2022   MCV 96.0 09/13/2021   MCV 96 12/11/2020   Lab Results  Component Value Date   TSH 3.960 11/05/2022  No results found for: "HGBA1C"  Lipid Panel     Component Value Date/Time   CHOL 197 11/05/2022 1224   CHOL 206 (H) 12/11/2020 0823   CHOL 203 (H) 05/22/2011 0759   TRIG 51 11/05/2022 1224   TRIG 65 05/22/2011 0759   HDL 83 11/05/2022 1224   HDL 98 12/11/2020 0823   HDL 88 (H) 05/22/2011 0759   CHOLHDL 2.4 11/05/2022 1224   VLDL 10 11/05/2022 1224   VLDL 13 05/22/2011 0759   LDLCALC 104 (H) 11/05/2022 1224   LDLCALC 98 12/11/2020 0823   LDLCALC 102 (H) 05/22/2011 0759   LDLDIRECT 102.2 04/23/2006 1211     RADIOLOGY: No results found.  IMPRESSION:  1. Essential hypertension     ASSESSMENT AND PLAN: Ms. Melvene Eskola is a 77 year-old female who has a history of SVT as well as hypertension. Remotely she had experiencing more frequent palpitations which led to further dose escalation of Cardizem up to 240 mg with improvement and due to blood pressure issues losartan was titrated up to 100 mg.  She had developed bradycardia and an attempt was made to reduce diltiazem but she felt this contributed to her increased blood pressure and did not feel well.  On diltiazem, low-dose metoprolol had been added but due to bradycardia this was discontinued.  Later, diltiazem substituted by amlodipine and blood pressure improved.  Today is excellent on her current regimen of amlodipine 5 mg and losartan 100 mg daily.  Her last echo Doppler study from August 25, 2019 was again reviewed which showed normal systolic and diastolic function, no significant valvular issues and normal pulmonary artery pressures.  In the past I had recommended initiation of Zetia this was never done.  She had lab work drawn today and I will see if there is any significant change from her prior December 05, 2019 LDL which was at 29 and total cholesterol 601.  She has a history of very elevated HDL.  She continues  to be on bupropion 40 and 50 mg daily for her depression.  She has previously documented mild carotid plaque and coronary calcification on CT imaging.  I will contact her regarding the results of her laboratory and adjustments will be made if necessary.  Otherwise I will see her in 1 year for reevaluation or sooner as needed.    Lennette Bihari, MD, [facility]  11/10/2022 11:05 AM

## 2022-11-10 NOTE — Patient Instructions (Signed)
Medication Instructions:  No changes *If you need a refill on your cardiac medications before your next appointment, please call your pharmacy*   Lab Work: none If you have labs (blood work) drawn today and your tests are completely normal, you will receive your results only by: MyChart Message (if you have MyChart) OR A paper copy in the mail If you have any lab test that is abnormal or we need to change your treatment, we will call you to review the results.   Testing/Procedures: none   Follow-Up: At [facility], you and your health needs are our priority.  As part of our continuing mission to provide you with exceptional heart care, we have created designated Provider Care Teams.  These Care Teams include your primary Cardiologist (physician) and Advanced Practice Providers (APPs -  Physician Assistants and Nurse Practitioners) who all work together to provide you with the care you need, when you need it.  We recommend signing up for the patient portal called "MyChart".  Sign up information is provided on this After Visit Summary.  MyChart is used to connect with patients for Virtual Visits (Telemedicine).  Patients are able to view lab/test results, encounter notes, upcoming appointments, etc.  Non-urgent messages can be sent to your provider as well.   To learn more about what you can do with MyChart, go to [email or web address].    Your next appointment:   6 month(s)  Provider:   Nicki Guadalajara, MD

## 2022-11-12 ENCOUNTER — Other Ambulatory Visit: Payer: Medicare Other

## 2022-11-12 ENCOUNTER — Encounter: Payer: Self-pay | Admitting: Cardiovascular Disease

## 2022-11-27 ENCOUNTER — Ambulatory Visit
Admission: RE | Admit: 2022-11-27 | Discharge: 2022-11-27 | Disposition: A | Discharge status: Home / Self Care | Payer: Medicare Other | Source: Ambulatory Visit | Attending: Internal Medicine | Admitting: Internal Medicine

## 2022-11-27 DIAGNOSIS — Z1231 Encounter for screening mammogram for malignant neoplasm of breast: Secondary | ICD-10-CM | POA: Diagnosis present

## 2022-12-07 ENCOUNTER — Ambulatory Visit (INDEPENDENT_AMBULATORY_CARE_PROVIDER_SITE_OTHER): Payer: Medicare Other

## 2022-12-07 DIAGNOSIS — E538 Deficiency of other specified B group vitamins: Secondary | ICD-10-CM

## 2022-12-07 DIAGNOSIS — Z23 Encounter for immunization: Secondary | ICD-10-CM | POA: Diagnosis not present

## 2022-12-07 MED ORDER — CYANOCOBALAMIN 1000 MCG/ML IJ SOLN
1000.0000 ug | Freq: Once | INTRAMUSCULAR | Status: AC
Start: 2022-12-07 — End: 2022-12-07
  Administered 2022-12-07: 1000 ug via INTRAMUSCULAR

## 2022-12-07 NOTE — Progress Notes (Signed)
Pt presented for their vitamin B12 injection. Pt was identified through two identifiers. Pt tolerated shot well in their right deltoid.   Pt also wanted the flu vaccine. High dose flu was offered to age. Pt tolerated Flu vaccine in the left deltoid.

## 2022-12-16 ENCOUNTER — Ambulatory Visit: Payer: Medicare Other | Admitting: Internal Medicine

## 2023-01-01 ENCOUNTER — Ambulatory Visit: Payer: Medicare Other | Admitting: Internal Medicine

## 2023-01-11 ENCOUNTER — Ambulatory Visit: Payer: Medicare Other

## 2023-01-11 DIAGNOSIS — E538 Deficiency of other specified B group vitamins: Secondary | ICD-10-CM | POA: Diagnosis not present

## 2023-01-11 MED ORDER — CYANOCOBALAMIN 1000 MCG/ML IJ SOLN
1000.0000 ug | Freq: Once | INTRAMUSCULAR | Status: AC
Start: 2023-01-11 — End: 2023-01-11
  Administered 2023-01-11: 1000 ug via INTRAMUSCULAR

## 2023-01-11 NOTE — Progress Notes (Signed)
Pt presented for their vitamin B12 injection. Pt was identified through two identifiers. Pt tolerated shot well in their left  deltoid.  

## 2023-01-12 ENCOUNTER — Encounter: Payer: Self-pay | Admitting: Orthopaedic Surgery

## 2023-01-12 ENCOUNTER — Ambulatory Visit (INDEPENDENT_AMBULATORY_CARE_PROVIDER_SITE_OTHER): Payer: Medicare Other | Admitting: Orthopaedic Surgery

## 2023-01-12 ENCOUNTER — Other Ambulatory Visit: Payer: Self-pay

## 2023-01-12 VITALS — BP 133/76 | HR 71 | Ht 63.0 in | Wt 100.0 lb

## 2023-01-12 DIAGNOSIS — M546 Pain in thoracic spine: Secondary | ICD-10-CM

## 2023-01-12 DIAGNOSIS — M549 Dorsalgia, unspecified: Secondary | ICD-10-CM

## 2023-01-12 NOTE — Progress Notes (Signed)
Office Visit Note   Patient: Chloe Perkins           Date of Birth: [birth year]           MRN: [id number] Visit Date: 01/12/2023              Requested by: Sherlene Shams, MD [address] [place],  [place] [address] PCP: Sherlene Shams, MD   Assessment & Plan: Visit Diagnoses:  1. Mid back pain   2. Pain in thoracic spine     Plan: Patient's certainly much better than she was 2 weeks ago.  She had a couple days where she could not get out of bed has been had to help bring her food.  Will set up for some therapy for modalities evaluation and treatment at [facility] regional outpatient therapy.  I will recheck her in 6 7 weeks if she is having persistent problems we will consider diagnostic imaging.    Follow-Up Instructions: Return in about 6 weeks (around 02/23/2023).   Orders:  Orders Placed This Encounter  Procedures   XR Thoracic Spine 2 View   Ambulatory referral to Physical Therapy   No orders of the defined types were placed in this encounter.     Procedures: No procedures performed   Clinical Data: No additional findings.   Subjective: Chief Complaint  Patient presents with   Middle Back - Pain    HPI 77 year old female here with her husband with problems with left-sided thoracic pain.  She had low back pain and some thoracic pain and states the low back pain started 2 or 3 weeks ago turns out she had a UTI was treated with the antibiotic steroids and muscle relaxant muscle pain medicine.  Thinks the muscle relaxant made it worse she states the back pain and frequency and dysuria resolved and culture showed E. coli.  She neck continues to have the parathoracic pain.  Additionally past history of some disc protrusion and cervical spine and also disc protrusion lumbar spine several levels.  History of melanoma back in 2019 no evidence of metastatic disease.    Review of Systems all other systems noncontributory to HPI.   Objective: Vital  Signs: BP 133/76   Pulse 71   Ht 5\' 3"  (1.6 m)   Wt 100 lb (45.4 kg)   BMI 17.71 kg/m   Physical Exam Constitutional:      Appearance: She is well-developed.  HENT:     Head: Normocephalic.     Right Ear: External ear normal.     Left Ear: External ear normal. There is no impacted cerumen.  Eyes:     Pupils: Pupils are equal, round, and reactive to light.  Neck:     Thyroid: No thyromegaly.     Trachea: No tracheal deviation.  Cardiovascular:     Rate and Rhythm: Normal rate.  Pulmonary:     Effort: Pulmonary effort is normal.  Abdominal:     Palpations: Abdomen is soft.  Musculoskeletal:     Cervical back: No rigidity.  Skin:    General: Skin is warm and dry.  Neurological:     Mental Status: She is alert and oriented to person, place, and time.  Psychiatric:        Behavior: Behavior normal.     Ortho Exam patient with tenderness over the left mid thoracic region.  No scapular winging full shoulder range of motion.  No abduction weakness rotator cuff testing is normal reflexes  are 2+ and symmetrical no lower extremity hyperreflexia.  No sciatic notch tenderness.  Specialty Comments:  No specialty comments available.  Imaging: No results found.   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) 12/17/2021   Melanoma of trunk (HCC) 12/17/2021   Left lower quadrant pain 12/17/2021   Colon cancer screening 03/23/2020   PAC (premature atrial contraction) 02/08/2019   Urinary hesitancy 10/25/2017   CAD (coronary artery disease), native coronary artery 07/06/2017   Pain in thoracic spine 12/18/2016   Spondylolysis, cervical region 05/19/2016   Solitary pulmonary nodule 05/04/2015   B12 deficiency 05/04/2015   Encounter for preventive health examination 03/19/2015    S/P hysterectomy 06/26/2013   CKD (chronic kidney disease) stage 3, GFR 30-59 ml/min (HCC) 06/26/2013   Subclinical hypothyroidism 06/26/2013   Mitral valve prolapse    HLD (hyperlipidemia)    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   Peripheral retinal scars 10/08/2011   PVD (posterior vitreous detachment), left eye 10/08/2011   Anxiety and depression 09/03/2010   IBS (irritable bowel syndrome) 09/03/2010   Essential hypertension 02/06/2008   Osteoporosis 02/06/2008   PERSONAL HISTORY OF MALIGNANT MELANOMA OF SKIN 02/06/2008   Past Medical History:  Diagnosis Date   Actinic keratosis    Anxiety and depression    Arthritis    Asthmatic bronchitis    Atypical nevus 01/27/2011   right side abdomen-severe atypia   Atypical nevus 11/09/2012   moderate atypia-left upper back   Atypical nevus 11/19/2012   moderate atypia-left forearm   BCC (basal cell carcinoma) 10/23/2002   left bridge nose-Bcc with sclerosis   Bronchitis    history of   Cancer (HCC)    MELANOMA   Depression    Dysrhythmia    Heart murmur    Hemorrhoids    History of melanoma    HLD (hyperlipidemia)    Hypertension    Melanoma (HCC) 10/18/1997   right post sholder-melanoma level 1   Mitral valve prolapse    Osteoporosis    Personal history of malignant melanoma of skin    Superficial basal cell carcinoma (BCC) 01/10/2013   right cheek   SVT (supraventricular tachycardia) (HCC)    SVT (supraventricular tachycardia) (HCC) 05/29/2013   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   Viral gastroenteritis     Family History  Problem Relation Age of Onset   Rheum arthritis Mother    Stroke Mother    Heart disease Mother        Father, brother, sister   Lung cancer Father    Emphysema Father        PGF, brother, sister   COPD Father        PGF, brother, sister   Heart disease Father    Heart disease Sister     Alzheimer's disease Sister    Heart disease Brother    Ovarian cancer Paternal Aunt    Lung cancer Paternal Grandfather    Breast cancer Neg Hx     Past Surgical History:  Procedure Laterality Date   ABDOMINAL HYSTERECTOMY  1975   CATARACT EXTRACTION W/PHACO Right 02/10/2017   Procedure: CATARACT EXTRACTION PHACO AND INTRAOCULAR LENS PLACEMENT (IOC);  Surgeon: Druscilla Brownie,  Chrissie Noa, MD;  Location: [facility] ORS;  Service: Ophthalmology;  Laterality: Right;  [place] 00:35.9 AP% 11.5 CDE 4.11 Fluid Pack Lot # [id number]   CATARACT EXTRACTION W/PHACO Left 03/09/2017   Procedure: CATARACT EXTRACTION PHACO AND INTRAOCULAR LENS PLACEMENT (IOC);  Surgeon: Galen Manila, MD;  Location: [facility] ORS;  Service: Ophthalmology;  Laterality: Left;  [place] 00:23 AP% 16.3 CDE 3.85 Fluid pack lot # [id number]   COLONOSCOPY  04/15/2004   2006: Normal   ENDOMETRIAL ABLATION  1976   left thumb tendon repair  10/09   MELANOMA EXCISION  1999   superficial spreading melanoma right post shoulder   NM MYOCAR PERF WALL MOTION  12/28/2008   No ischemia   SKIN CANCER EXCISION  03/02/2013   superficial basal cell cancer   US ECHOCARDIOGRAPHY  10/16/2010   mitral valve leaflets midly thickened,trace MR.   WRIST SURGERY Left 2011   Social History   Occupational History   Occupation: Retired  Tobacco Use   Smoking status: Former    Current packs/day: 0.00    Average packs/day: 0.5 packs/day for 20.0 years (10.0 ttl pk-yrs)    Types: Cigarettes    Start date: 02/09/1970    Quit date: 02/09/1990    Years since quitting: 32.9   Smokeless tobacco: Never  Vaping Use   Vaping status: Never Used  Substance and Sexual Activity   Alcohol use: No    Alcohol/week: 0.0 standard drinks of alcohol   Drug use: No   Sexual activity: Yes

## 2023-02-08 ENCOUNTER — Ambulatory Visit: Payer: Medicare Other

## 2023-02-16 ENCOUNTER — Encounter: Payer: Self-pay | Admitting: Internal Medicine

## 2023-02-16 ENCOUNTER — Ambulatory Visit (INDEPENDENT_AMBULATORY_CARE_PROVIDER_SITE_OTHER): Payer: Medicare Other | Admitting: Internal Medicine

## 2023-02-16 VITALS — BP 116/68 | HR 76 | Ht 63.0 in | Wt 101.6 lb

## 2023-02-16 DIAGNOSIS — I10 Essential (primary) hypertension: Secondary | ICD-10-CM | POA: Diagnosis not present

## 2023-02-16 DIAGNOSIS — E038 Other specified hypothyroidism: Secondary | ICD-10-CM

## 2023-02-16 DIAGNOSIS — E782 Mixed hyperlipidemia: Secondary | ICD-10-CM | POA: Diagnosis not present

## 2023-02-16 DIAGNOSIS — I491 Atrial premature depolarization: Secondary | ICD-10-CM

## 2023-02-16 DIAGNOSIS — L309 Dermatitis, unspecified: Secondary | ICD-10-CM | POA: Diagnosis not present

## 2023-02-16 DIAGNOSIS — E538 Deficiency of other specified B group vitamins: Secondary | ICD-10-CM

## 2023-02-16 DIAGNOSIS — M810 Age-related osteoporosis without current pathological fracture: Secondary | ICD-10-CM

## 2023-02-16 DIAGNOSIS — R7301 Impaired fasting glucose: Secondary | ICD-10-CM

## 2023-02-16 MED ORDER — TRIAMCINOLONE ACETONIDE 0.1 % EX CREA
1.0000 | TOPICAL_CREAM | Freq: Two times a day (BID) | CUTANEOUS | 2 refills | Status: AC
Start: 2023-02-16 — End: ?

## 2023-02-16 NOTE — Progress Notes (Signed)
 Subjective:  Patient ID: Chloe Perkins, female    DOB: Jun 14, 1945  Age: 78 y.o. MRN: [id number]  CC: The primary encounter diagnosis was Essential hypertension. Diagnoses of Moderate mixed hyperlipidemia not requiring statin therapy, Impaired fasting glucose, Eczema of hand, Subclinical hypothyroidism, PAC (premature atrial contraction), Age-related osteoporosis without current pathological fracture, and B12 deficiency were also pertinent to this visit.   HPI Chloe Perkins presents for  Chief Complaint  Patient presents with   Medical Management of Chronic Issues  LAST SEEN BY ME FEB 2022  (2 YRS AGO)   1) recent COVID infection late December.  Recovery was uneventful.  2) HTN:  Patient is taking her medications as prescribed and notes no adverse effects.  Home BP readings have been done about once per week and are  generally < 130/80 .  She is avoiding added salt in her diet and walking regularly about 3 times per week for exercise  . Taking 5 mg amlodipine  100  mg losartan    3) h/o palpitations:  did not tolerate low dose toprol  prescribed by dr burnard ,  due to severe bradydaria  4) CAD: asymptomatic.  LDL not at goal due to intolerance of statins   3) B12 DEFICIENCY   4) cHRONIC BACK PAIN;  currently resolved did not do the PT   5) recent UTI E Coli  per [facility] 1 month ago,   resolved with cipro   Outpatient Medications Prior to Visit  Medication Sig Dispense Refill   amLODipine  (NORVASC ) 5 MG tablet TAKE 1 TABLET(5 MG) BY MOUTH DAILY 90 tablet 3   buPROPion (WELLBUTRIN XL) 150 MG 24 hr tablet Take 300 mg by mouth daily.     calcium -vitamin D  (OSCAL WITH D) 500-200 MG-UNIT per tablet Take 1 tablet by mouth 2 (two) times daily.     LORazepam  (ATIVAN ) 0.5 MG tablet Take 0.5 mg by mouth 2 (two) times daily.     losartan  (COZAAR ) 100 MG tablet Take 1 tablet (100 mg total) by mouth daily. 90 tablet 3   HYDROcodone -acetaminophen  (NORCO/VICODIN) 5-325 MG tablet Take  by mouth. (Patient not taking: Reported on 02/16/2023)     tiZANidine  (ZANAFLEX ) 2 MG tablet  (Patient not taking: Reported on 02/16/2023)     triamcinolone  cream (KENALOG ) 0.1 % Apply 1 Application topically as directed. Bid to aa eczema on wrist and hands until clear, then prn flares, avoid face, groin, axilla (Patient not taking: Reported on 02/16/2023) 80 g 1   No facility-administered medications prior to visit.    Review of Systems;  Patient denies headache, fevers, malaise, unintentional weight loss, skin rash, eye pain, sinus congestion and sinus pain, sore throat, dysphagia,  hemoptysis , cough, dyspnea, wheezing, chest pain, palpitations, orthopnea, edema, abdominal pain, nausea, melena, diarrhea, constipation, flank pain, dysuria, hematuria, urinary  Frequency, nocturia, numbness, tingling, seizures,  Focal weakness, Loss of consciousness,  Tremor, insomnia, depression, anxiety, and suicidal ideation.      Objective:  BP 116/68   Pulse 76   Ht 5' 3 (1.6 m)   Wt 101 lb 9.6 oz (46.1 kg)   SpO2 96%   BMI 18.00 kg/m   BP Readings from Last 3 Encounters:  02/16/23 116/68  01/12/23 133/76  11/10/22 (!) 144/94    Wt Readings from Last 3 Encounters:  02/16/23 101 lb 9.6 oz (46.1 kg)  01/12/23 100 lb (45.4 kg)  11/10/22 104 lb 3.2 oz (47.3 kg)    Physical Exam Vitals reviewed.  Constitutional:  General: She is not in acute distress.    Appearance: Normal appearance. She is normal weight. She is not ill-appearing, toxic-appearing or diaphoretic.  HENT:     Head: Normocephalic.  Eyes:     General: No scleral icterus.       Right eye: No discharge.        Left eye: No discharge.     Conjunctiva/sclera: Conjunctivae normal.  Cardiovascular:     Rate and Rhythm: Normal rate and regular rhythm.     Heart sounds: Normal heart sounds.  Pulmonary:     Effort: Pulmonary effort is normal. No respiratory distress.     Breath sounds: Normal breath sounds.  Musculoskeletal:         General: Normal range of motion.  Skin:    General: Skin is warm and dry.  Neurological:     General: No focal deficit present.     Mental Status: She is alert and oriented to person, place, and time. Mental status is at baseline.  Psychiatric:        Mood and Affect: Mood normal.        Behavior: Behavior normal.        Thought Content: Thought content normal.        Judgment: Judgment normal.    No results found for: HGBA1C  Lab Results  Component Value Date   CREATININE 1.03 (H) 11/05/2022   CREATININE 1.00 09/13/2021   CREATININE 1.06 (H) 12/11/2020    Lab Results  Component Value Date   WBC 4.8 11/05/2022   HGB 13.8 11/05/2022   HCT 40.9 11/05/2022   PLT 188 11/05/2022   GLUCOSE 94 11/05/2022   CHOL 197 11/05/2022   TRIG 51 11/05/2022   HDL 83 11/05/2022   LDLDIRECT 102.2 04/23/2006   LDLCALC 104 (H) 11/05/2022   ALT 13 11/05/2022   AST 23 11/05/2022   NA 139 11/05/2022   K 3.9 11/05/2022   CL 101 11/05/2022   CREATININE 1.03 (H) 11/05/2022   BUN 17 11/05/2022   CO2 27 11/05/2022   TSH 3.960 11/05/2022   MICROALBUR 0.3 12/13/2013    MM 3D SCREENING MAMMOGRAM BILATERAL BREAST Result Date: 12/01/2022 CLINICAL DATA:  Screening. EXAM: DIGITAL SCREENING BILATERAL MAMMOGRAM WITH TOMOSYNTHESIS AND CAD TECHNIQUE: Bilateral screening digital craniocaudal and mediolateral oblique mammograms were obtained. Bilateral screening digital breast tomosynthesis was performed. The images were evaluated with computer-aided detection. COMPARISON:  Previous exam(s). ACR Breast Density Category d: The breasts are extremely dense, which lowers the sensitivity of mammography. FINDINGS: There are no findings suspicious for malignancy. IMPRESSION: No mammographic evidence of malignancy. A result letter of this screening mammogram will be mailed directly to the patient. RECOMMENDATION: Screening mammogram in one year. (Code:SM-B-01Y) BI-RADS CATEGORY  1: Negative. Electronically  Signed   By: Rosaline Collet M.D.   On: 12/01/2022 08:45    Assessment & Plan:  .Essential hypertension Assessment & Plan: Well controlled on current regimen of amlodipine  and losartan . Renal function is stable, no changes today.  Lab Results  Component Value Date   CREATININE 1.03 (H) 11/05/2022   Lab Results  Component Value Date   NA 139 11/05/2022   K 3.9 11/05/2022   CL 101 11/05/2022   CO2 27 11/05/2022     Orders: -     Microalbumin / creatinine urine ratio; Future -     Comprehensive metabolic panel; Future  Moderate mixed hyperlipidemia not requiring statin therapy Assessment & Plan: She has deferred treatment  with statin  despite evidence of aortic atherosclerosis .  Advised to consider cardiac coronary CTA  Lab Results  Component Value Date   CHOL 197 11/05/2022   HDL 83 11/05/2022   LDLCALC 104 (H) 11/05/2022   LDLDIRECT 102.2 04/23/2006   TRIG 51 11/05/2022   CHOLHDL 2.4 11/05/2022    Orders: -     Lipid panel; Future -     LDL cholesterol, direct; Future  Impaired fasting glucose -     Comprehensive metabolic panel; Future -     Hemoglobin A1c; Future  Eczema of hand Assessment & Plan: Trial of triamc inolone   Orders: -     Triamcinolone  Acetonide; Apply 1 Application topically 2 (two) times daily. To hand rash  Dispense: 80 g; Refill: 2  Subclinical hypothyroidism Assessment & Plan: Subclinical..  Intolerant of low dose synthroid  on prior trials. Will repeat tsh annually  Lab Results  Component Value Date   TSH 3.960 11/05/2022      PAC (premature atrial contraction) Assessment & Plan: Managed without medications    Age-related osteoporosis without current pathological fracture Assessment & Plan: With no history of fractures,   Alendronate inolerant.  Prolia advised but deferred by patient. continue calcium  supplementation and weight bearing exercise. Evista relatively C/I given CAD   B12 deficiency Assessment &  Plan: Managed with monthly injections since diagnosis in 2017.  Rechecking today with  IF ab       I provided 40 minutes of face-to-face time during this encounter reviewing patient's last visit with me, patient's  most recent visit with cardiology,  recent surgical and non surgical procedures, previous  labs and imaging studies, counseling on currently addressed issues,  and post visit ordering to diagnostics and therapeutics .   Follow-up: Return in about 6 months (around 08/16/2023).   Chloe LITTIE Kettering, MD

## 2023-02-16 NOTE — Assessment & Plan Note (Signed)
 Subclinical..  Intolerant of low dose synthroid on prior trials. Will repeat tsh annually  Lab Results  Component Value Date   TSH 3.960 11/05/2022

## 2023-02-16 NOTE — Assessment & Plan Note (Signed)
 Well controlled on current regimen of amlodipine  and losartan . Renal function is stable, no changes today.  Lab Results  Component Value Date   CREATININE 1.03 (H) 11/05/2022   Lab Results  Component Value Date   NA 139 11/05/2022   K 3.9 11/05/2022   CL 101 11/05/2022   CO2 27 11/05/2022

## 2023-02-16 NOTE — Assessment & Plan Note (Signed)
With no history of fractures,   Alendronate inolerant.  Prolia advised but deferred by patient. continue calcium supplementation and weight bearing exercise. Evista relatively C/I given CAD 

## 2023-02-16 NOTE — Assessment & Plan Note (Signed)
Trial of triamcinolone. 

## 2023-02-16 NOTE — Assessment & Plan Note (Signed)
Managed without medications.

## 2023-02-16 NOTE — Patient Instructions (Addendum)
 Your chest Ct noted coronary atherosclerosis (placque in your coronary arteries).  This means you are at greater risk for heart attacks and stroke than other people.    Lowering your cholesterol is recommended to decrease your risk .  Please Talk with Dr Burnard about getting a coronary calcium  CTA  to investigate this further  There is a medication called Repatha, that lowers cholesterol with an injection every 2 weeks (like your B12 injections)  Return for fasting labs in early April

## 2023-02-16 NOTE — Assessment & Plan Note (Addendum)
 She has deferred treatment  with statin despite evidence of aortic atherosclerosis .  Advised to consider cardiac coronary CTA  Lab Results  Component Value Date   CHOL 197 11/05/2022   HDL 83 11/05/2022   LDLCALC 104 (H) 11/05/2022   LDLDIRECT 102.2 04/23/2006   TRIG 51 11/05/2022   CHOLHDL 2.4 11/05/2022

## 2023-02-16 NOTE — Assessment & Plan Note (Signed)
 Managed with monthly injections since diagnosis in 2017.  Rechecking today with  IF ab

## 2023-04-02 ENCOUNTER — Other Ambulatory Visit: Payer: Self-pay

## 2023-04-02 ENCOUNTER — Emergency Department
Admission: EM | Admit: 2023-04-02 | Discharge: 2023-04-02 | Disposition: A | Discharge status: Home / Self Care | Payer: Medicare Other | Attending: Emergency Medicine | Admitting: Emergency Medicine

## 2023-04-02 ENCOUNTER — Encounter: Payer: Self-pay | Admitting: Intensive Care

## 2023-04-02 ENCOUNTER — Emergency Department: Payer: Medicare Other

## 2023-04-02 DIAGNOSIS — I10 Essential (primary) hypertension: Secondary | ICD-10-CM | POA: Insufficient documentation

## 2023-04-02 DIAGNOSIS — E860 Dehydration: Secondary | ICD-10-CM | POA: Insufficient documentation

## 2023-04-02 DIAGNOSIS — Z20822 Contact with and (suspected) exposure to covid-19: Secondary | ICD-10-CM | POA: Diagnosis not present

## 2023-04-02 DIAGNOSIS — R509 Fever, unspecified: Secondary | ICD-10-CM | POA: Diagnosis present

## 2023-04-02 DIAGNOSIS — B349 Viral infection, unspecified: Secondary | ICD-10-CM | POA: Diagnosis not present

## 2023-04-02 LAB — CBC
HCT: 40.7 % (ref 36.0–46.0)
Hemoglobin: 13.8 g/dL (ref 12.0–15.0)
MCH: 32.4 pg (ref 26.0–34.0)
MCHC: 33.9 g/dL (ref 30.0–36.0)
MCV: 95.5 fL (ref 80.0–100.0)
Platelets: 177 10*3/uL (ref 150–400)
RBC: 4.26 MIL/uL (ref 3.87–5.11)
RDW: 11.9 % (ref 11.5–15.5)
WBC: 5.7 10*3/uL (ref 4.0–10.5)
nRBC: 0 % (ref 0.0–0.2)

## 2023-04-02 LAB — RESP PANEL BY RT-PCR (RSV, FLU A&B, COVID)  RVPGX2
Influenza A by PCR: NEGATIVE
Influenza B by PCR: NEGATIVE
Resp Syncytial Virus by PCR: NEGATIVE
SARS Coronavirus 2 by RT PCR: NEGATIVE

## 2023-04-02 LAB — URINALYSIS, ROUTINE W REFLEX MICROSCOPIC
Bacteria, UA: NONE SEEN
Bilirubin Urine: NEGATIVE
Glucose, UA: NEGATIVE mg/dL
Ketones, ur: NEGATIVE mg/dL
Nitrite: NEGATIVE
Protein, ur: NEGATIVE mg/dL
Specific Gravity, Urine: 1.02 (ref 1.005–1.030)
pH: 5 (ref 5.0–8.0)

## 2023-04-02 LAB — COMPREHENSIVE METABOLIC PANEL
ALT: 19 U/L (ref 0–44)
AST: 31 U/L (ref 15–41)
Albumin: 3.6 g/dL (ref 3.5–5.0)
Alkaline Phosphatase: 56 U/L (ref 38–126)
Anion gap: 11 (ref 5–15)
BUN: 25 mg/dL — ABNORMAL HIGH (ref 8–23)
CO2: 22 mmol/L (ref 22–32)
Calcium: 8.9 mg/dL (ref 8.9–10.3)
Chloride: 101 mmol/L (ref 98–111)
Creatinine, Ser: 0.88 mg/dL (ref 0.44–1.00)
GFR, Estimated: >60 mL/min (ref >60)
Glucose, Bld: 133 mg/dL — ABNORMAL HIGH (ref 70–99)
Potassium: 3.7 mmol/L (ref 3.5–5.1)
Sodium: 134 mmol/L — ABNORMAL LOW (ref 135–145)
Total Bilirubin: 0.8 mg/dL (ref 0.0–1.2)
Total Protein: 6.7 g/dL (ref 6.5–8.1)

## 2023-04-02 LAB — LIPASE, BLOOD: Lipase: 41 U/L (ref 11–51)

## 2023-04-02 MED ORDER — KETOROLAC TROMETHAMINE 30 MG/ML IJ SOLN
15.0000 mg | Freq: Once | INTRAMUSCULAR | Status: AC
  Administered 2023-04-02: 15 mg via INTRAVENOUS
  Filled 2023-04-02: qty 1

## 2023-04-02 MED ORDER — FOSFOMYCIN TROMETHAMINE 3 G PO PACK
3.0000 g | PACK | Freq: Once | ORAL | Status: AC
  Administered 2023-04-02: 3 g via ORAL
  Filled 2023-04-02: qty 3

## 2023-04-02 MED ORDER — ONDANSETRON HCL 4 MG/2ML IJ SOLN
4.0000 mg | Freq: Once | INTRAMUSCULAR | Status: AC
  Administered 2023-04-02: 4 mg via INTRAVENOUS
  Filled 2023-04-02: qty 2

## 2023-04-02 MED ORDER — SODIUM CHLORIDE 0.9 % IV BOLUS
1000.0000 mL | Freq: Once | INTRAVENOUS | Status: AC
  Administered 2023-04-02: 1000 mL via INTRAVENOUS

## 2023-04-02 NOTE — ED Triage Notes (Signed)
 C/o fever, abdominal pain, cough, sore throat, diarrhea, and emesis since Tuesday.

## 2023-04-02 NOTE — ED Provider Notes (Signed)
 [facility] Provider Note    Event Date/Time   First MD Initiated Contact with Patient 04/02/23 1919     (approximate)  History   Chief Complaint: Emesis and Fever  HPI  Chloe Perkins is a 78 y.o. female with a past medical history of arthritis, depression, hypertension, hyperlipidemia, SVT, presents to the emergency department for generalized fatigue weakness nausea vomiting diarrhea and cough over the past 1 week.  According to the patient approximately 1 week ago she developed subjective fever cough sore throat abdominal pain nausea vomiting and diarrhea.  She states most of her symptoms have resolved however she continues to feel a general sensation of fatigue and weakness.  Denies any abdominal pain recently.  States she last had vomiting and diarrhea on Wednesday.  Still has a sore throat and a slight cough  Physical Exam   Triage Vital Signs: ED Triage Vitals  Encounter Vitals Group     BP 04/02/23 1843 122/67     Systolic BP Percentile --      Diastolic BP Percentile --      Pulse Rate 04/02/23 1841 98     Resp 04/02/23 1841 20     Temp 04/02/23 1841 98.3 F (36.8 C)     Temp Source 04/02/23 1841 Oral     SpO2 04/02/23 1841 94 %     Weight 04/02/23 1842 108 lb (49 kg)     Height 04/02/23 1842 5\' 4"  (1.626 m)     Head Circumference --      Peak Flow --      Pain Score 04/02/23 1842 9     Pain Loc --      Pain Education --      Exclude from Growth Chart --     Most recent vital signs: Vitals:   04/02/23 1841 04/02/23 1843  BP:  122/67  Pulse: 98   Resp: 20   Temp: 98.3 F (36.8 C)   SpO2: 94%     General: Awake, no distress.  CV:  Good peripheral perfusion.  Regular rate and rhythm  Resp:  Normal effort.  Equal breath sounds bilaterally.  Abd:  No distention.  Soft, nontender.  No rebound or guarding.  ED Results / Procedures / Treatments   RADIOLOGY  Chest x-ray viewed interpreted by myself shows no  consolidation. Radiology is read the x-ray of COPD without acute process.   MEDICATIONS ORDERED IN ED: Medications  sodium chloride 0.9 % bolus 1,000 mL (1,000 mLs Intravenous New Bag/Given 04/02/23 1955)  ketorolac (TORADOL) 30 MG/ML injection 15 mg (15 mg Intravenous Given 04/02/23 1955)  ondansetron (ZOFRAN) injection 4 mg (4 mg Intravenous Given 04/02/23 1955)     IMPRESSION / MDM / ASSESSMENT AND PLAN / ED COURSE  I reviewed the triage vital signs and the nursing notes.  Patient's presentation is most consistent with acute presentation with potential threat to life or bodily function.  Patient presents to the emergency department for viral type symptoms.  Patient states a week ago or so she developed nausea vomiting diarrhea as well as a sore throat subjective fever and cough.  States the symptoms have since resolved besides she continues to have sore throat and fatigue/weakness.  Overall the patient appears well, reassuring physical exam, benign abdomen.  Patient's workup is reassuring so far with a reassuring CBC normal white blood cell count, chemistry shows no significant findings.  LFTs and lipase are normal.  Urinalysis does show 21-50 white cells  but no other finding.  Will send urine culture dose a one-time dose of fosfomycin.  Patient receiving medications and fluids we will see how the patient feels highly suspect patient is recovering from a viral syndrome.  Will likely be able to be discharged home with supportive care and PCP follow-up.  Patient's workup is reassuring.  Given the patient's reassuring workup I believe the patient is safe for discharge home.  Patient agreeable to plan of care.  Discussed over-the-counter treatments including benzocaine/Chloraseptic throat spray and using Tylenol.  Patient will follow-up with her doctor.  FINAL CLINICAL IMPRESSION(S) / ED DIAGNOSES   Viral syndrome Dehydration Weakness   Note:  This document was prepared using Dragon voice  recognition software and may include unintentional dictation errors.   Minna Antis, MD 04/02/23 2102

## 2023-04-02 NOTE — Discharge Instructions (Signed)
 As we discussed please use Tylenol 1000 mg every 6 hours as needed for fever or discomfort.  Please use an over-the-counter throat spray or lozenge that contains benzocaine such as Chloraseptic spray to help with any discomfort.  Please follow-up with your doctor in the next Avril days for recheck/reevaluation.  Return to the emergency department for any trouble breathing or any other symptom personally concerning to yourself.

## 2023-04-04 LAB — URINE CULTURE: Culture: <10000 — AB

## 2023-04-06 ENCOUNTER — Ambulatory Visit: Payer: Medicare Other | Admitting: Pulmonary Disease

## 2023-04-06 ENCOUNTER — Encounter: Payer: Self-pay | Admitting: Pulmonary Disease

## 2023-04-06 ENCOUNTER — Telehealth: Payer: Self-pay | Admitting: Internal Medicine

## 2023-04-06 VITALS — BP 122/78 | HR 91 | Temp 97.1°F | Ht 64.0 in | Wt 105.0 lb

## 2023-04-06 DIAGNOSIS — B9789 Other viral agents as the cause of diseases classified elsewhere: Secondary | ICD-10-CM

## 2023-04-06 DIAGNOSIS — J028 Acute pharyngitis due to other specified organisms: Secondary | ICD-10-CM

## 2023-04-06 DIAGNOSIS — B349 Viral infection, unspecified: Secondary | ICD-10-CM

## 2023-04-06 DIAGNOSIS — K529 Noninfective gastroenteritis and colitis, unspecified: Secondary | ICD-10-CM

## 2023-04-06 MED ORDER — BENZONATATE 100 MG PO CAPS: 100.0000 mg | ORAL_CAPSULE | Freq: Three times a day (TID) | ORAL | 0 refills | Status: DC | PRN

## 2023-04-06 NOTE — Patient Instructions (Addendum)
 VISIT SUMMARY:  Chloe Perkins, a 78 year old female, visited [place] today due to a febrile illness she has been experiencing for nine days. Her symptoms began with vomiting and diarrhea, followed by a fever, and a non-productive cough (which has now resolved). She was previously seen in the emergency room, where she was given antibiotics for a suspected urinary tract infection but has not taken any at home. She also reports gastrointestinal discomfort and a sore throat from vomiting.  YOUR PLAN:  -FEBRILE ILLNESS: A febrile illness means having a fever, often due to an infection. Your symptoms suggest a viral infection, so we recommend staying hydrated with Pedialyte, drinking ginger tea to soothe your stomach, and taking Pepcid and Zyrtec for relief. We will also send a note to Dr. Darrick Huntsman for an expedited appointment.  -GASTROINTESTINAL SYMPTOMS: Your gastrointestinal symptoms, including gas and rumbling, may be related to the viral illness or acid production. We recommend taking Pepcid to help soothe your stomach.  You likely have a gastroenteritis.  -SUSPECTED URINARY TRACT INFECTION (UTI): A urinary tract infection (UTI) is an infection in any part of your urinary system. Although the emergency room indicated a possible UTI, we need to confirm this with your urologist before starting antibiotics.  You declined empiric treatment preferring to contact your urologist.  Do so as UTI can give you the symptoms you are having.  INSTRUCTIONS:  Please follow up with Dr. Darrick Huntsman within the next week or two for further evaluation and management. Additionally, ensure you stay hydrated and take the recommended medications as discussed.

## 2023-04-06 NOTE — Telephone Encounter (Signed)
 Left a message to call office and schedule an appointment for tomorrow, patient doesn't feel well. She can see any provider at office.

## 2023-04-06 NOTE — Progress Notes (Unsigned)
 Subjective:    Patient ID: Chloe Perkins, female    DOB: [birth year], 78 y.o.   MRN: [id number]  Patient Care Team: Sherlene Shams, MD as PCP - General (Internal Medicine) Lennette Bihari, MD as PCP - Cardiology (Cardiology) Iva Boop, MD as Consulting Physician (Gastroenterology) Deirdre Evener, MD (Dermatology) Eldred Manges, MD as Consulting Physician (Orthopedic Surgery)  Chief Complaint  Patient presents with   Consult    Has been sick for 8 days. Vomiting. Sore Throat. Little SOB. No wheezing. Cough, dry.     BACKGROUND:   HPI Discussed the use of AI scribe software for clinical note transcription with the patient, who gave verbal consent to proceed.  History of Present Illness   Chloe Perkins is a 78 year old female who presents with a febrile illness.  She has been experiencing a febrile illness for about nine days, initially presenting with vomiting for approximately five hours one night. This was followed by diarrhea, which has since improved but remains irregular. She had a fever last night, with a temperature reaching 100F. She visited the emergency room on 21 February, where blood work and a chest x-ray were performed, with the chest x-ray reported as normal. She was given antibiotics at the emergency room for a suspected urinary tract infection but has not taken any antibiotics at home.  She experiences shortness of breath when walking and a non-productive cough however she says that this is "a little" and not her main complaint.. No current diarrhea, but bowel movements are irregular. No significant improvement since the emergency room visit. She also reports a sore throat from vomiting, which made it difficult to talk for two days.  She states that she just feels "miserable" but concentrates her concerns on her GI symptoms.  She mentions a lot of gas and rumbling in her stomach but denies any stomach pain. She has been unable to eat and mentions that  ginger ale does not taste right to her anymore. She reports a history of allergies to several medications, though specific allergies are not detailed.  She has a history of urinary tract infections but has not had one in over a year. The emergency room visit indicated a possible UTI with some growth in the urine sample.  Her past medical history includes a history of smoking, which she quit about forty years ago. No current use of inhalers.     Recommended antibiotic to cover for potential UTI but she declines this.  Wants something to soothe her stomach more.    Review of Systems A 10 point review of systems was performed and it is as noted above otherwise negative.   Past Medical History:  Diagnosis Date   Actinic keratosis    Anxiety and depression    Arthritis    Asthmatic bronchitis    Atypical nevus 01/27/2011   right side abdomen-severe atypia   Atypical nevus 11/09/2012   moderate atypia-left upper back   Atypical nevus 11/19/2012   moderate atypia-left forearm   BCC (basal cell carcinoma) 10/23/2002   left bridge nose-Bcc with sclerosis   Bronchitis    history of   Cancer (HCC)    MELANOMA   Depression    Dysrhythmia    Heart murmur    Hemorrhoids    History of melanoma    HLD (hyperlipidemia)    Hypertension    Melanoma (HCC) 10/18/1997   right post sholder-melanoma level 1   Mitral valve prolapse  Osteoporosis    Personal history of malignant melanoma of skin    Superficial basal cell carcinoma (BCC) 01/10/2013   right cheek   SVT (supraventricular tachycardia) (HCC)    SVT (supraventricular tachycardia) (HCC) 05/29/2013   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   Viral gastroenteritis     Past Surgical History:  Procedure Laterality Date   ABDOMINAL HYSTERECTOMY  1975   CATARACT EXTRACTION W/PHACO Right 02/10/2017   Procedure: CATARACT EXTRACTION PHACO  AND INTRAOCULAR LENS PLACEMENT (IOC);  Surgeon: Galen Manila, MD;  Location: [facility] ORS;  Service: Ophthalmology;  Laterality: Right;  [place] 00:35.9 AP% 11.5 CDE 4.11 Fluid Pack Lot # [id number]   CATARACT EXTRACTION W/PHACO Left 03/09/2017   Procedure: CATARACT EXTRACTION PHACO AND INTRAOCULAR LENS PLACEMENT (IOC);  Surgeon: Galen Manila, MD;  Location: [facility] OR[facility];  Service: Ophthalmology;  Laterality: Left;  [place] 00:23 AP% 16.3 CDE 3.85 Fluid pack lot # [id number]   COLONOSCOPY  04/15/2004   2006: Normal   ENDOMETRIAL ABLATION  1976   left thumb tendon repair  10/09   MELANOMA EXCISION  1999   superficial spreading melanoma right post shoulder   NM MYOCAR PERF WALL MOTION  12/28/2008   No ischemia   SKIN CANCER EXCISION  03/02/2013   superficial basal cell cancer   US ECHOCARDIOGRAPHY  10/16/2010   mitral valve leaflets midly thickened,trace MR.   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) 12/17/2021   Melanoma of trunk (HCC) 12/17/2021   Left lower quadrant pain 12/17/2021   Colon cancer screening 03/23/2020   PAC (premature atrial contraction) 02/08/2019   Urinary hesitancy 10/25/2017   CAD (coronary artery disease), native coronary artery 07/06/2017   Pain in thoracic spine 12/18/2016   Spondylolysis, cervical region 05/19/2016   Solitary pulmonary nodule 05/04/2015   B12 deficiency 05/04/2015   Encounter for preventive health examination 03/19/2015   S/P hysterectomy 06/26/2013   CKD (chronic kidney disease) stage 3, GFR 30-59 ml/min (HCC) 06/26/2013   Subclinical hypothyroidism 06/26/2013   Mitral valve prolapse    HLD (hyperlipidemia)    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   Peripheral retinal scars 10/08/2011   PVD (posterior  vitreous detachment), left eye 10/08/2011   Anxiety and depression 09/03/2010   IBS (irritable bowel syndrome) 09/03/2010   Essential hypertension 02/06/2008   Osteoporosis 02/06/2008   PERSONAL HISTORY OF MALIGNANT MELANOMA OF SKIN 02/06/2008    Family History  Problem Relation Age of Onset   Rheum arthritis Mother    Stroke Mother    Heart disease Mother        Father, brother, sister   Lung cancer Father    Emphysema Father        PGF, brother, sister   COPD Father        PGF, brother, sister   Heart disease Father    Heart disease Sister    Alzheimer's disease Sister    Heart disease Brother    Ovarian cancer Paternal Aunt    Lung cancer Paternal Grandfather    Breast cancer Neg Hx     Social History   Tobacco Use  Smoking status: Former    Current packs/day: 0.00    Average packs/day: 0.5 packs/day for 20.0 years (10.0 ttl pk-yrs)    Types: Cigarettes    Start date: 02/09/1970    Quit date: 02/09/1990    Years since quitting: 33.1   Smokeless tobacco: Never  Substance Use Topics   Alcohol use: No    Alcohol/week: 0.0 standard drinks of alcohol    Allergies  Allergen Reactions   Codeine Nausea And Vomiting   Macrobid [Nitrofurantoin]     Upsets stomach.   Metoprolol     Severe bradycardia    Adhesive [Tape] Rash    PAPER TAPE ONLY (INTOLERANCE TO LEADS--EKG)   Alendronate Sodium Other (See Comments)    REACTION: pt states INTOL to Fosamax   Verapamil Other (See Comments)    REACTION: Intol w/ bradycardia    Current Meds  Medication Sig   amLODipine (NORVASC) 5 MG tablet TAKE 1 TABLET(5 MG) BY MOUTH DAILY   buPROPion (WELLBUTRIN XL) 150 MG 24 hr tablet Take 300 mg by mouth daily.   calcium-vitamin D (OSCAL WITH D) 500-200 MG-UNIT per tablet Take 1 tablet by mouth 2 (two) times daily.   LORazepam (ATIVAN) 0.5 MG tablet Take 0.5 mg by mouth 2 (two) times daily.   losartan (COZAAR) 100 MG tablet Take 1 tablet (100 mg total) by mouth daily.    triamcinolone cream (KENALOG) 0.1 % Apply 1 Application topically 2 (two) times daily. To hand rash    Immunization History  Administered Date(s) Administered   Fluad Quad(high Dose 65+) 11/24/2018   Fluad Trivalent(High Dose 65+) 12/07/2022   Influenza Split 11/27/2010, 11/05/2011, 12/09/2012, 11/09/2013   Influenza Whole 02/09/2009   Influenza, High Dose Seasonal PF 11/11/2015, 12/27/2017   Influenza-Unspecified 12/08/2014   PFIZER(Purple Top)SARS-COV-2 Vaccination 03/13/2019, 04/03/2019, 12/11/2019   Pneumococcal Conjugate-13 03/18/2015   Pneumococcal Polysaccharide-23 12/10/2011   Tdap 07/09/2015        Objective:     BP 122/78 (BP Location: Right Arm, Cuff Size: Normal)   Pulse 91   Temp (!) 97.1 F (36.2 C)   Ht 5\' 4"  (1.626 m)   Wt 105 lb (47.6 kg) Comment: per the patient.  SpO2 92%   BMI 18.02 kg/m   SpO2: 92 % O2 Device: None (Room air)  GENERAL: Thin elderly woman, no acute respiratory distress, fully ambulatory, wearing facemask.  No conversational dyspnea, no cough, mild hoarseness. HEAD: Normocephalic, atraumatic.  EYES: Pupils equal, round, reactive to light.  No scleral icterus.  MOUTH: Mask removed for examination.  Dentition intact, pharynx is clear.  No exudates or petechiae noted. NECK: Supple. No thyromegaly. Trachea midline. No JVD.  No adenopathy. PULMONARY: Excellent air entry bilaterally.  No adventitious sounds whatsoever. CARDIOVASCULAR: S1 and S2. Regular rate and rhythm.  No rubs, murmurs or gallops heard. ABDOMEN: Thin, otherwise benign. MUSCULOSKELETAL: No joint deformity, no clubbing, no edema.  NEUROLOGIC: No overt focal deficit, no gait disturbance, speech is fluent. SKIN: Intact,warm,dry. PSYCH: Cantankerous, at times argumentative.  Recent Results (from the past 2160 hours)  Resp panel by RT-PCR (RSV, Flu A&B, Covid) Anterior Nasal Swab     Status: None   Collection Time: 04/02/23  6:38 PM   Specimen: Anterior Nasal Swab  Result  Value Ref Range   SARS Coronavirus 2 by RT PCR NEGATIVE NEGATIVE    Comment: (NOTE) SARS-CoV-2 target nucleic acids are NOT DETECTED.  The SARS-CoV-2 RNA is generally detectable in upper respiratory specimens during the acute phase of infection.  The lowest concentration of SARS-CoV-2 viral copies this assay can detect is 138 copies/mL. A negative result does not preclude SARS-Cov-2 infection and should not be used as the sole basis for treatment or other patient management decisions. A negative result may occur with  improper specimen collection/handling, submission of specimen other than nasopharyngeal swab, presence of viral mutation(s) within the areas targeted by this assay, and inadequate number of viral copies(<138 copies/mL). A negative result must be combined with clinical observations, patient history, and epidemiological information. The expected result is Negative.  Fact Sheet for Patients:  BloggerCourse.com  Fact Sheet for Healthcare Providers:  SeriousBroker.it  This test is no t yet approved or cleared by the [place] FDA and  has been authorized for detection and/or diagnosis of SARS-CoV-2 by FDA under an Emergency Use Authorization (EUA). This EUA will remain  in effect (meaning this test can be used) for the duration of the COVID-19 declaration under Section 564(b)(1) of the Act, 21 U.S.C.section 360bbb-3(b)(1), unless the authorization is terminated  or revoked sooner.       Influenza A by PCR NEGATIVE NEGATIVE   Influenza B by PCR NEGATIVE NEGATIVE    Comment: (NOTE) The Xpert Xpress SARS-CoV-2/FLU/RSV plus assay is intended as an aid in the diagnosis of influenza from Nasopharyngeal swab specimens and should not be used as a sole basis for treatment. Nasal washings and aspirates are unacceptable for Xpert Xpress SARS-CoV-2/FLU/RSV testing.  Fact Sheet for  Patients: BloggerCourse.com  Fact Sheet for Healthcare Providers: SeriousBroker.it  This test is not yet approved or cleared by the [place] FDA and has been authorized for detection and/or diagnosis of SARS-CoV-2 by FDA under an Emergency Use Authorization (EUA). This EUA will remain in effect (meaning this test can be used) for the duration of the COVID-19 declaration under Section 564(b)(1) of the Act, 21 U.S.C. section 360bbb-3(b)(1), unless the authorization is terminated or revoked.     Resp Syncytial Virus by PCR NEGATIVE NEGATIVE    Comment: (NOTE) Fact Sheet for Patients: BloggerCourse.com  Fact Sheet for Healthcare Providers: SeriousBroker.it  This test is not yet approved or cleared by the [place] FDA and has been authorized for detection and/or diagnosis of SARS-CoV-2 by FDA under an Emergency Use Authorization (EUA). This EUA will remain in effect (meaning this test can be used) for the duration of the COVID-19 declaration under Section 564(b)(1) of the Act, 21 U.S.C. section 360bbb-3(b)(1), unless the authorization is terminated or revoked.  Performed at [facility], [address] [place], [place] [address]   Lipase, blood     Status: None   Collection Time: 04/02/23  6:45 PM  Result Value Ref Range   Lipase 41 11 - 51 U/L    Comment: Performed at [facility], [address] [place], [place] [address]  Comprehensive metabolic panel     Status: Abnormal   Collection Time: 04/02/23  6:45 PM  Result Value Ref Range   Sodium 134 (L) 135 - 145 mmol/L   Potassium 3.7 3.5 - 5.1 mmol/L   Chloride 101 98 - 111 mmol/L   CO2 22 22 - 32 mmol/L   Glucose, Bld 133 (H) 70 - 99 mg/dL    Comment: Glucose reference range applies only to samples taken after fasting for at least 8 hours.   BUN 25 (H) 8 - 23 mg/dL   Creatinine, Ser 0.98 0.44 -  1.00 mg/dL   Calcium 8.9 8.9 - 11.9 mg/dL   Total Protein  6.7 6.5 - 8.1 g/dL   Albumin 3.6 3.5 - 5.0 g/dL   AST 31 15 - 41 U/L   ALT 19 0 - 44 U/L   Alkaline Phosphatase 56 38 - 126 U/L   Total Bilirubin 0.8 0.0 - 1.2 mg/dL   GFR, Estimated >16 >10 mL/min    Comment: (NOTE) Calculated using the CKD-EPI Creatinine Equation (2021)    Anion gap 11 5 - 15    Comment: Performed at [facility], [address] [place], [place] [address]  CBC     Status: None   Collection Time: 04/02/23  6:45 PM  Result Value Ref Range   WBC 5.7 4.0 - 10.5 K/uL   RBC 4.26 3.87 - 5.11 MIL/uL   Hemoglobin 13.8 12.0 - 15.0 g/dL   HCT 40.9 81.1 - 91.4 %   MCV 95.5 80.0 - 100.0 fL   MCH 32.4 26.0 - 34.0 pg   MCHC 33.9 30.0 - 36.0 g/dL   RDW 78.2 95.6 - 21.3 %   Platelets 177 150 - 400 K/uL   nRBC 0.0 0.0 - 0.2 %    Comment: Performed at [facility], [address] [place], [place] [address]  Urinalysis, Routine w reflex microscopic -Urine, Clean Catch     Status: Abnormal   Collection Time: 04/02/23  6:46 PM  Result Value Ref Range   Color, Urine YELLOW (A) YELLOW   APPearance CLEAR (A) CLEAR   Specific Gravity, Urine 1.020 1.005 - 1.030   pH 5.0 5.0 - 8.0   Glucose, UA NEGATIVE NEGATIVE mg/dL   Hgb urine dipstick SMALL (A) NEGATIVE   Bilirubin Urine NEGATIVE NEGATIVE   Ketones, ur NEGATIVE NEGATIVE mg/dL   Protein, ur NEGATIVE NEGATIVE mg/dL   Nitrite NEGATIVE NEGATIVE   Leukocytes,Ua MODERATE (A) NEGATIVE   RBC / HPF 0-5 0 - 5 RBC/hpf   WBC, UA 21-50 0 - 5 WBC/hpf   Bacteria, UA NONE SEEN NONE SEEN   Squamous Epithelial / HPF 0-5 0 - 5 /HPF   Mucus PRESENT     Comment: Performed at [facility], [address] [place], [place] [address]  Urine Culture     Status: Abnormal   Collection Time: 04/02/23  6:46 PM   Specimen: Urine, Clean Catch  Result Value Ref Range   Specimen Description      URINE, CLEAN CATCH Performed at [facility], [address], [place], [place] [address]    Special Requests      NONE Performed at [facility], [address] [place], [place] [address]    Culture (A)     <10,000 COLONIES/mL INSIGNIFICANT GROWTH Performed at [facility], [address], [place], [place] [address]    Report Status 04/04/2023 FINAL    Chest x-ray obtained 02 April 2023 showing no acute infiltrate:   Assessment & Plan:     ICD-10-CM   1. Gastroenteritis  K52.9     2. Sore throat (viral)  J02.8    B97.89     3. Viral syndrome  B34.9      Meds ordered this encounter  Medications   benzonatate (TESSALON PERLES) 100 MG capsule    Sig: Take 1 capsule (100 mg total) by mouth 3 (three) times daily as needed for cough.    Dispense:  20 capsule    Refill:  0   Discussion:    Febrile Illness Febrile illness for nine days with initial symptoms of  vomiting and diarrhea. Currently experiencing intermittent cough (patient not noted to cough during the entire visit and was not principal symptom), mild exertional dyspnea, and generalized weakness. Chest x-ray and auscultation were clear. Likely viral etiology given the absence of bacterial infection signs and the presence of circulating viruses in the community. Emphasized hydration and supportive care. - Encourage hydration with Pedialyte - Recommend ginger tea for stomach soothing - Advise taking Pepcid and Zyrtec for symptom relief - Sent a note to Dr. Darrick Huntsman for an expedited appointment  Gastrointestinal Symptoms/gastroenteritis Ongoing gastrointestinal discomfort with gas and rumbling, no current diarrhea. Symptoms may be related to viral illness or excessive acid production. Discussed Pepcid over-the-counter to help with dyspeptic symptoms - Advise using over-the-counter Pepcid   Suspected Urinary Tract Infection (UTI) Possible UTI indicated by emergency room urinalysis showing some growth. Received a single dose of antibiotics in the emergency room  but has not taken further antibiotics. Urologist advised against antibiotics without confirmation due to immune system concerns. Discussed risks of untreated UTI versus unnecessary antibiotic use and the importance of confirming the diagnosis with the urologist. - Patient declined empiric treatment for UTI with antibiotics - Advise follow-up with urologist for confirmation before starting antibiotics  Follow-up - Sent a note to Dr. Darrick Huntsman to schedule an appointment within the next week or two.  Dr. Darrick Huntsman will follow-up on this issue.  *Note as the patient was leaving he demanded to have something for "her cough", this symptom was minimized when she was being interviewed and she did not exhibit any cough during her visit.  Will provide with some Tessalon Perles to use as needed this may also be helpful for her sore throat.     Advised if symptoms do not improve or worsen, to please contact office for sooner follow up or seek emergency care.    I spent 45 minutes of dedicated to the care of this patient on the date of this encounter to include pre-visit review of records, face-to-face time with the patient discussing conditions above, post visit ordering of testing, clinical documentation with the electronic health record, making appropriate referrals as documented, and communicating necessary findings to members of the patients care team.   C. Danice Goltz, MD Advanced Bronchoscopy PCCM [facility]    *This note was dictated using voice recognition software/Dragon.  Despite best efforts to proofread, errors can occur which can change the meaning. Any transcriptional errors that result from this process are unintentional and may not be fully corrected at the time of dictation.

## 2023-04-08 ENCOUNTER — Ambulatory Visit (INDEPENDENT_AMBULATORY_CARE_PROVIDER_SITE_OTHER): Payer: Medicare Other | Admitting: Internal Medicine

## 2023-04-08 ENCOUNTER — Encounter: Payer: Self-pay | Admitting: Internal Medicine

## 2023-04-08 VITALS — BP 110/60 | HR 88 | Ht 64.0 in | Wt 99.4 lb

## 2023-04-08 DIAGNOSIS — R5383 Other fatigue: Secondary | ICD-10-CM | POA: Insufficient documentation

## 2023-04-08 DIAGNOSIS — N183 Chronic kidney disease, stage 3 unspecified: Secondary | ICD-10-CM | POA: Diagnosis not present

## 2023-04-08 DIAGNOSIS — E876 Hypokalemia: Secondary | ICD-10-CM | POA: Diagnosis not present

## 2023-04-08 DIAGNOSIS — G9331 Postviral fatigue syndrome: Secondary | ICD-10-CM

## 2023-04-08 DIAGNOSIS — E538 Deficiency of other specified B group vitamins: Secondary | ICD-10-CM

## 2023-04-08 MED ORDER — OMEPRAZOLE 20 MG PO CPDR: 20.0000 mg | DELAYED_RELEASE_CAPSULE | Freq: Every day | ORAL | 2 refills | Status: DC

## 2023-04-08 NOTE — Progress Notes (Unsigned)
 Subjective:  Patient ID: Chloe Perkins, female    DOB: December 04, 1945  Age: 78 y.o. 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 Diagnoses of B12 deficiency and Stage 3 chronic kidney disease, unspecified whether stage 3a or 3b CKD (HCC) were also pertinent to this visit.   HPI ELIZET KAPLAN presents for  Chief Complaint  Patient presents with   Hospitalization Follow-up    ED follow up    evaluated in ER on Feb 21 for  persistent fatigue and weakness following a 9 day history of a resolving viral illness that started 9 days earlier with sudden onset n/v that lasted 5 hours. Accompanied  by diarrhea  and cough without body aches,  but had fevers to 103 .  No UTI by ER evaluation .  Chest x ray was normal as well.  Sodium was slightly low but CR was normal  Did not   Outpatient Medications Prior to Visit  Medication Sig Dispense Refill   amLODipine (NORVASC) 5 MG tablet TAKE 1 TABLET(5 MG) BY MOUTH DAILY 90 tablet 3   buPROPion (WELLBUTRIN XL) 150 MG 24 hr tablet Take 300 mg by mouth daily.     calcium-vitamin D (OSCAL WITH D) 500-200 MG-UNIT per tablet Take 1 tablet by mouth 2 (two) times daily.     LORazepam (ATIVAN) 0.5 MG tablet Take 0.5 mg by mouth 2 (two) times daily.     losartan (COZAAR) 100 MG tablet Take 1 tablet (100 mg total) by mouth daily. 90 tablet 3   triamcinolone cream (KENALOG) 0.1 % Apply 1 Application topically 2 (two) times daily. To hand rash 80 g 2   benzonatate (TESSALON PERLES) 100 MG capsule Take 1 capsule (100 mg total) by mouth 3 (three) times daily as needed for cough. (Patient not taking: Reported on 04/08/2023) 20 capsule 0   No facility-administered medications prior to visit.    Review of Systems;  Patient denies headache, fevers, malaise, unintentional weight loss, skin rash, eye pain, sinus congestion and sinus pain, sore throat, dysphagia,  hemoptysis , cough, dyspnea, wheezing, chest pain, palpitations,  orthopnea, edema, abdominal pain, nausea, melena, diarrhea, constipation, flank pain, dysuria, hematuria, urinary  Frequency, nocturia, numbness, tingling, seizures,  Focal weakness, Loss of consciousness,  Tremor, insomnia, depression, anxiety, and suicidal ideation.      Objective:  BP 110/60   Pulse 88   Ht 5\' 4"  (1.626 m)   Wt 99 lb 6.4 oz (45.1 kg)   SpO2 96%   BMI 17.06 kg/m   BP Readings from Last 3 Encounters:  04/08/23 110/60  04/06/23 122/78  04/02/23 124/68    Wt Readings from Last 3 Encounters:  04/08/23 99 lb 6.4 oz (45.1 kg)  04/06/23 105 lb (47.6 kg)  04/02/23 108 lb (49 kg)    Physical Exam  No results found for: "HGBA1C"  Lab Results  Component Value Date   CREATININE 0.88 04/02/2023   CREATININE 1.03 (H) 11/05/2022   CREATININE 1.00 09/13/2021    Lab Results  Component Value Date   WBC 5.7 04/02/2023   HGB 13.8 04/02/2023   HCT 40.7 04/02/2023   PLT 177 04/02/2023   GLUCOSE 133 (H) 04/02/2023   CHOL 197 11/05/2022   TRIG 51 11/05/2022   HDL 83 11/05/2022   LDLDIRECT 102.2 04/23/2006   LDLCALC 104 (H) 11/05/2022   ALT 19 04/02/2023   AST 31 04/02/2023   NA 134 (L) 04/02/2023   K 3.7 04/02/2023  CL 101 04/02/2023   CREATININE 0.88 04/02/2023   BUN 25 (H) 04/02/2023   CO2 22 04/02/2023   TSH 3.960 11/05/2022   MICROALBUR 0.3 12/13/2013    DG Chest Portable 1 View Result Date: 04/02/2023 CLINICAL DATA:  Cough, fever EXAM: PORTABLE CHEST 1 VIEW COMPARISON:  09/13/2021 FINDINGS: There is hyperinflation of the lungs compatible with COPD. Heart and mediastinal contours are within normal limits. No focal opacities or effusions. No acute bony abnormality. IMPRESSION: COPD. No active disease. Electronically Signed   By: Charlett Nose M.D.   On: 04/02/2023 20:05    Assessment & Plan:  .Postviral fatigue syndrome Assessment & Plan: She was evaluated and treated in ER on Feb 21 for fatigue and weakness following a 9 day viral syndrome.  UTI ruled  out,  viral panel negative.   Repeat evaluation by Dr Jayme Cloud (patient accompanied her husband to his appt and  requested to be seen same day for cough ) was also nonspecific and no cough was noted during visit .  Mild hyponatremia c/w dehydration was noted.  Repeat soidum level is pending.  Advised to use gatorade or pedialyte to rehydrate.    Lab Results  Component Value Date   WBC 5.7 04/02/2023   HGB 13.8 04/02/2023   HCT 40.7 04/02/2023   MCV 95.5 04/02/2023   PLT 177 04/02/2023   Lab Results  Component Value Date   NA 134 (L) 04/02/2023   K 3.7 04/02/2023   CL 101 04/02/2023   CO2 22 04/02/2023     Orders: -     CBC with Differential/Platelet -     Comprehensive metabolic panel  B12 deficiency Assessment & Plan: Managed with monthly injections since diagnosis in 2017. Given today    Stage 3 chronic kidney disease, unspecified whether stage 3a or 3b CKD (HCC) Assessment & Plan: Renal function is at  baseline with avoidance of NSAIDs.  She is on an ARB for control of hypertension  Lab Results  Component Value Date   CREATININE 0.88 04/02/2023   Lab Results  Component Value Date   MICROALBUR 0.3 12/13/2013         Other orders -     Omeprazole; Take 1 capsule (20 mg total) by mouth daily.  Dispense: 30 capsule; Refill: 2     I spent 34 minutes on the day of this face to face encounter reviewing patient's  most recent ER visit,  her recent evaluation by Dr Jayme Cloud, ,  prior relevant surgical and non surgical procedures, recent  labs and imaging studies, reviewing the assessment and plan with patient, and post visit ordering and reviewing of  diagnostics and therapeutics with patient  .   Follow-up: No follow-ups on file.   Sherlene Shams, MD

## 2023-04-08 NOTE — Assessment & Plan Note (Signed)
 She was evaluated and treated in ER on Feb 21 for fatigue and weakness following a 9 day viral syndrome.  UTI ruled out,  viral panel negative.   Repeat evaluation by Dr Jayme Cloud (patient accompanied her husband to his appt and demanded to be seen as well) was also nonspecific and no cough was noted during visit

## 2023-04-08 NOTE — Patient Instructions (Addendum)
 Omeprazole 20 mg daily to suppress the acid in your stomach to allow your esophagus  to heal   Your ER labs showed dehydration and low sodium   You need to increase your fluid intake with gatorade (the red flavor is delicision,  like a  fruit  punch) or Pedialyte   You can use Delsym and robitussin for the cough  Tell Chloe Perkins not to worry about checking his blood sugars for now

## 2023-04-09 LAB — CBC WITH DIFFERENTIAL/PLATELET
Basophils Absolute: 0.1 10*3/uL (ref 0.0–0.1)
Basophils Relative: 0.8 % (ref 0.0–3.0)
Eosinophils Absolute: 0.1 10*3/uL (ref 0.0–0.7)
Eosinophils Relative: 1 % (ref 0.0–5.0)
HCT: 38.6 % (ref 36.0–46.0)
Hemoglobin: 13.3 g/dL (ref 12.0–15.0)
Lymphocytes Relative: 19.6 % (ref 12.0–46.0)
Lymphs Abs: 1.3 10*3/uL (ref 0.7–4.0)
MCHC: 34.4 g/dL (ref 30.0–36.0)
MCV: 93.3 fl (ref 78.0–100.0)
Monocytes Absolute: 0.6 10*3/uL (ref 0.1–1.0)
Monocytes Relative: 9.6 % (ref 3.0–12.0)
Neutro Abs: 4.4 10*3/uL (ref 1.4–7.7)
Neutrophils Relative %: 69 % (ref 43.0–77.0)
Platelets: 311 10*3/uL (ref 150.0–400.0)
RBC: 4.14 Mil/uL (ref 3.87–5.11)
RDW: 12.9 % (ref 11.5–15.5)
WBC: 6.4 10*3/uL (ref 4.0–10.5)

## 2023-04-09 LAB — COMPREHENSIVE METABOLIC PANEL
ALT: 14 U/L (ref 0–35)
AST: 23 U/L (ref 0–37)
Albumin: 3.7 g/dL (ref 3.5–5.2)
Alkaline Phosphatase: 66 U/L (ref 39–117)
BUN: 11 mg/dL (ref 6–23)
CO2: 28 meq/L (ref 19–32)
Calcium: 9.2 mg/dL (ref 8.4–10.5)
Chloride: 101 meq/L (ref 96–112)
Creatinine, Ser: 0.83 mg/dL (ref 0.40–1.20)
GFR: 67.32 mL/min (ref >60.00)
Glucose, Bld: 113 mg/dL — ABNORMAL HIGH (ref 70–99)
Potassium: 3 meq/L — ABNORMAL LOW (ref 3.5–5.1)
Sodium: 140 meq/L (ref 135–145)
Total Bilirubin: 0.5 mg/dL (ref 0.2–1.2)
Total Protein: 6.8 g/dL (ref 6.0–8.3)

## 2023-04-09 NOTE — Assessment & Plan Note (Signed)
 Renal function is at  baseline with avoidance of NSAIDs.  She is on an ARB for control of hypertension  Lab Results  Component Value Date   CREATININE 0.88 04/02/2023   Lab Results  Component Value Date   MICROALBUR 0.3 12/13/2013

## 2023-04-09 NOTE — Assessment & Plan Note (Signed)
 Managed with monthly injections since diagnosis in 2017. Given today

## 2023-04-10 DIAGNOSIS — E876 Hypokalemia: Secondary | ICD-10-CM | POA: Insufficient documentation

## 2023-04-10 MED ORDER — POTASSIUM CHLORIDE CRYS ER 20 MEQ PO TBCR: 20.0000 meq | EXTENDED_RELEASE_TABLET | Freq: Two times a day (BID) | ORAL | 0 refills | Status: DC

## 2023-04-10 MED ORDER — MAGNESIUM OXIDE 250 MG PO TABS: 1.0000 | ORAL_TABLET | Freq: Every day | ORAL | 0 refills | Status: DC

## 2023-04-10 NOTE — Addendum Note (Signed)
 Addended by: Sherlene Shams on: 04/10/2023 06:48 PM   Modules accepted: Orders

## 2023-05-03 ENCOUNTER — Encounter: Payer: Self-pay | Admitting: Cardiovascular Disease

## 2023-05-03 ENCOUNTER — Ambulatory Visit: Payer: Medicare Other | Attending: Cardiovascular Disease | Admitting: Cardiovascular Disease

## 2023-05-03 VITALS — BP 136/77 | HR 75 | Ht 64.0 in | Wt 96.0 lb

## 2023-05-03 DIAGNOSIS — F419 Anxiety disorder, unspecified: Secondary | ICD-10-CM | POA: Diagnosis present

## 2023-05-03 DIAGNOSIS — E785 Hyperlipidemia, unspecified: Secondary | ICD-10-CM | POA: Diagnosis not present

## 2023-05-03 DIAGNOSIS — I471 Supraventricular tachycardia, unspecified: Secondary | ICD-10-CM | POA: Diagnosis not present

## 2023-05-03 DIAGNOSIS — I2510 Atherosclerotic heart disease of native coronary artery without angina pectoris: Secondary | ICD-10-CM | POA: Diagnosis present

## 2023-05-03 DIAGNOSIS — F32A Depression, unspecified: Secondary | ICD-10-CM | POA: Diagnosis present

## 2023-05-03 DIAGNOSIS — I6529 Occlusion and stenosis of unspecified carotid artery: Secondary | ICD-10-CM | POA: Insufficient documentation

## 2023-05-03 DIAGNOSIS — I10 Essential (primary) hypertension: Secondary | ICD-10-CM | POA: Insufficient documentation

## 2023-05-03 MED ORDER — LOSARTAN POTASSIUM 100 MG PO TABS: 100.0000 mg | ORAL_TABLET | Freq: Every day | ORAL | 3 refills | Status: DC

## 2023-05-03 MED ORDER — AMLODIPINE BESYLATE 5 MG PO TABS: ORAL_TABLET | ORAL | 3 refills | Status: DC

## 2023-05-03 NOTE — Patient Instructions (Signed)
 Medication Instructions:  No medication changes were made during today's visit.  *If you need a refill on your cardiac medications before your next appointment, please call your pharmacy*   Lab Work: No labs were ordered during today's visit.  If you have labs (blood work) drawn today and your tests are completely normal, you will receive your results only by: MyChart Message (if you have MyChart) OR A paper copy in the mail If you have any lab test that is abnormal or we need to change your treatment, we will call you to review the results.   Testing/Procedures: No procedures were ordered during today's visit.    Follow-Up: At [facility], you and your health needs are our priority.  As part of our continuing mission to provide you with exceptional heart care, we have created designated Provider Care Teams.  These Care Teams include your primary Cardiologist (physician) and Advanced Practice Providers (APPs -  Physician Assistants and Nurse Practitioners) who all work together to provide you with the care you need, when you need it.  We recommend signing up for the patient portal called "MyChart".  Sign up information is provided on this After Visit Summary.  MyChart is used to connect with patients for Virtual Visits (Telemedicine).  Patients are able to view lab/test results, encounter notes, upcoming appointments, etc.  Non-urgent messages can be sent to your provider as well.   To learn more about what you can do with MyChart, go to [email or web address].    Your next appointment:   1 year(s)  Provider:   Dr. Epifanio Lesches     Other Instructions [facility]  [address] [place], [place] [address] OPENING APRIL [phone]       1st Floor: - Lobby - Registration  - Pharmacy  - Lab - Cafe   2nd Floor: - PV Lab - Diagnostic Testing (echo, CT, nuclear med)   3rd Floor: - Vacant   4th Floor: - TCTS (cardiothoracic  surgery) - AFib Clinic - Structural Heart Clinic - Vascular Surgery  - Vascular Ultrasound   5th Floor: - [facility] (general and EP) - Clinical Pharmacy for coumadin, hypertension, lipid, weight-loss medications, and med management appointments      Valet parking services will be available as well.       Thank you for choosing [facility]!

## 2023-05-07 ENCOUNTER — Encounter: Payer: Self-pay | Admitting: Cardiovascular Disease

## 2023-05-07 NOTE — Progress Notes (Signed)
 Patient ID: Chloe Perkins, female   DOB: 1945/08/26, 78 y.o.   MRN: [id number]       Primary M.D.: Dr. Duncan Dull  HPI: Chloe Perkins is a 78 y.o. female who presents to the office for a 5 month cardiology follow-up evaluation.  Chloe Perkins has a history of SVT and hypertension, for which she has been on diltiazem at 120 mg  and losartan 50 mg daily.  She has  also remote history of thyroid abnormality and in the past  had taken levothyroxine.  She has mild renal insufficiency with stage III renal disease.  Has a history of anxiety/depression for which she takes Wellbutrin XL 300 mg daily.  She also is on calcium with vitamin D in light of osteoporosis.  Recently, she believes that she has been experiencing more episodes of palpitations which are short-lived and at times occur daily.  She denies associated presyncope or syncope.  She denies chest pain.  She recently underwent a panoramix dental x-ray and was told by her dentist that she may have calcification of her carotids.   She has a history of osteoporosis, as well as vitamin B12 deficiency and has previously been found to have stable solitary pulmonary nodule.  She had a follow-up echo Doppler study in November 2016 which showed an ejection fraction at 60-65%.  She had normal diastolic parameters.  There was turbulent flow at the base of her aortic valve in the region of the right coronary cusp which was most likely the cause of her systolic murmur.    I saw her November 2017, she was experiencing  transient palpitations lasting less than 5 seconds, almost on a daily basis.  She has been using caffeine.  She underwent a carotid duplex study in July 2017 which showed heterogeneous plaque bilaterally and 1-39% bilateral ICA stenoses with normal subclavian arteries, and patent vertebral arteries with antegrade flow.  I saw her, I further titrated her Cardizem and suggested that she take 240 mg at bedtime.  She's continued to take  losartan 75 mg in the morning.  Since I  saw her in February 2018, she states that she has continued to experience short-lived episodes of asymptomatic palpitations which may last anywhere from 5-10 seconds, but seemed to occur on a daily basis.  She has been on slow acting diltiazem at 240 mg daily and has continued to take losartan 75 mg daily for hypertension.  She also is on Ativan and Wellbutrin.   She underwent a CT scan by Dr. Duncan Dull.  She was found to have a stable 1 cm nodule in the right lower lobe that was unchanged from previously.  Incidentally, she was also noted to have coronary artery calcification suggestive of CAD.  Once I found out about this, I had contacted her and recommended initiation of Crestor at an initial very low dose of 10 mg.  Since she was having almost daily palpitations when I saw her several weeks ago  I also attempted to add very low-dose metoprolol succinate at 12.5 mg.  Her heart rate had dropped into the upper 40s with this and she stopped taking it; however, toprol did improve her daily palpitations.    When I saw her in October 2018 I had a lengthy discussion with her regarding the fact that she has demonstration of subclinical atherosis in the importance of trying to induce plaque regression.  After much discussion, she agreed to initiate low-dose statin therapy.  She was started on  low-dose Crestor.  Apparently, she self discontinued this since she felt this caused her to be nauseated.  On for brief 14 2018.  Repeat laboratory off treatment showed a total cholesterol 2:15, HDL 87, LDL 114, and triglycerides 71.    When I saw her in March 2019 she had discontinued Crestor because of feeling nauseated which she attributed to Crestor and I initiated Zetia.  She saw Corine Shelter on Jul 06, 2017 as an add-on for slow heart rate.  Heart rate on his exam was 48.  He decreased diltiazem back to 120 mg daily   Apparently, Chloe Perkins did not feel well on the 120 of  diltiazem and for this reason went back to take her previous 180 mg dose.  She had noticed her recent pressure elevation and also had noticed a mild headache.  She took the reduced dose ifor 5 days and then went back to 180 mg with feeling improved.    When I saw her on July 29, 2017 for follow-up her blood pressure was elevated on losartan 100 mg daily.  At that time I suggested she discontinue diltiazem and changed her to metoprolol tartrate 25 mg twice a day depending upon her heart rate.  Her ECG during that evaluation showed a bigeminal rhythm with alternating sinus and low atrial beats.  She was tolerating Zetia for hyperlipidemia in light of her documentation of subclinical atherosclerosis.  Apparently, she had called the office and was having issues with elevated blood pressure and slow heart rate.  During my absence, her metoprolol was stopped and she was started on amlodipine 5 mg daily and to use the metoprolol 12.5 mg on an as-needed basis.  She wore a  Monitor for 5 days saw Dr. Graciela Husbands for follow-up evaluation.  She is unaware of any recurrent episodes of tachycardia.  She denies presyncope or syncope.  I saw her in March 2020 at which time she was doing well and denied any recurrent episodes of palpitations.  She was  on amlodipine 5 mg in addition to losartan 100 mg hypertension.  I had started her on Zetia for hyperlipidemia but apparently she was not taking this.. She continued to take bupropion 450 mg for depression.  She had seen Dr. Duncan Dull for annual exam and laboratory from that date was reviewed.  Recent lipid panel in November 2019 was 89 with an HDL of 85.    I saw her in December 2020 at which time she felt that she was stable from a cardiac standpoint.  She denied any chest pain but had noted some shortness of breath with step walking.    She was seen by Dr. Graciela Husbands on February 09, 2019.  Pulse was 59 and blood pressure was stable.  She had documented PACs and he reassured her  that her PACs were benign.  There was no documentation of atrial fibrillation.   She was evaluated by me in a telemedicine visit on August 02, 2019. Over the prior 6 months, she had experienced some episodes of shortness of breath with activity.  She continued to take Wellbutrin for depression.  She underwent evaluation of thyroid function and TSH was 6.7.  Presently she denies chest pain or shortness of breath.  She had not had recent labs.  She had not had a recent echo since 2016 and with her exertional dyspnea I have recommended that she undergo a follow-up echo evaluation.  She underwent an echo Doppler study on August 25 2019.  This  showed an EF of 60 to 65%.  Diastolic parameters were normal.  She had normal pulmonary artery systolic pressure.  I saw her in follow-up on December 05, 2019 at which time she felt well.  She was not exercising and was having bar spine issues.  Several weeks previously she had undergone gone on a steroid injection with some improvement.  She is unaware of any recurrent palpitations.    I saw her on December 11, 2020. She has remained stable.  She had laboratory drawn today which was not yet available for this office visit.  Her blood pressures at home typically run in the 120s with diastolics in the 70s.  She has continued to be on amlodipine 5 mg and losartan 100 mg for hypertension.  She is on bupropion 450 mg daily for depression and takes as needed lorazepam.  She sees Dr. Duncan Dull for primary care.    She was evaluated by Oris Drone, NP on December 08, 2021 and remained stable.  I last saw her on November 10, 2022 at which time she continued to do well.  She denies chest pain or shortness of breath.  She denies any palpitations, presyncope or syncope.  She continues to be followed by Dr. Darrick Huntsman and will be seeing her in November 2024.  She continues to be on amlodipine 5 mg, losartan 100 mg daily for hypertension.  She is on bupropion 300 mg daily for anxiety and  depression.    Presently, Ms. Arriola feels well.  However she was sick for approximately 3 weeks with vomiting and diarrhea which ultimately has resolved.  She continues to be on amlodipine 5 mg and losartan 100 mg daily for hypertension.  She is on bupropion for anxiety/depression.  She continues to see Dr. Duncan Dull for primary care.  Remotely she was on lipid-lowering therapy but did not tolerate statins and had been on Zetia but it is no longer on this as well.  Dr. Patrice Paradise has been checking her laboratory.  She presents for evaluation.  Past Medical History:  Diagnosis Date   Actinic keratosis    Anxiety and depression    Arthritis    Asthmatic bronchitis    Atypical nevus 01/27/2011   right side abdomen-severe atypia   Atypical nevus 11/09/2012   moderate atypia-left upper back   Atypical nevus 11/19/2012   moderate atypia-left forearm   BCC (basal cell carcinoma) 10/23/2002   left bridge nose-Bcc with sclerosis   Bronchitis    history of   Cancer (HCC)    MELANOMA   Depression    Dysrhythmia    Heart murmur    Hemorrhoids    History of melanoma    HLD (hyperlipidemia)    Hypertension    Melanoma (HCC) 10/18/1997   right post sholder-melanoma level 1   Mitral valve prolapse    Osteoporosis    Personal history of malignant melanoma of skin    Superficial basal cell carcinoma (BCC) 01/10/2013   right cheek   SVT (supraventricular tachycardia) (HCC)    SVT (supraventricular tachycardia) (HCC) 05/29/2013   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   Viral gastroenteritis     Past Surgical History:  Procedure Laterality Date   ABDOMINAL HYSTERECTOMY  1975   CATARACT EXTRACTION W/PHACO Right 02/10/2017   Procedure: CATARACT EXTRACTION PHACO AND INTRAOCULAR LENS PLACEMENT (IOC);  Surgeon: Galen Manila, MD;  Location: [facility] ORS;  Service:  Ophthalmology;  Laterality: Right;   [place] 00:35.9 AP% 11.5 CDE 4.11 Fluid Pack Lot # [id number]   CATARACT EXTRACTION W/PHACO Left 03/09/2017   Procedure: CATARACT EXTRACTION PHACO AND INTRAOCULAR LENS PLACEMENT (IOC);  Surgeon: Galen Manila, MD;  Location: [facility] ORS;  Service: Ophthalmology;  Laterality: Left;  [place] 00:23 AP% 16.3 CDE 3.85 Fluid pack lot # [id number]   COLONOSCOPY  04/15/2004   2006: Normal   ENDOMETRIAL ABLATION  1976   left thumb tendon repair  10/09   MELANOMA EXCISION  1999   superficial spreading melanoma right post shoulder   NM MYOCAR PERF WALL MOTION  12/28/2008   No ischemia   SKIN CANCER EXCISION  03/02/2013   superficial basal cell cancer   US ECHOCARDIOGRAPHY  10/16/2010   mitral valve leaflets midly thickened,trace MR.   WRIST SURGERY Left 2011    Allergies  Allergen Reactions   Codeine Nausea And Vomiting   Macrobid [Nitrofurantoin]     Upsets stomach.   Metoprolol     Severe bradycardia    Adhesive [Tape] Rash    PAPER TAPE ONLY (INTOLERANCE TO LEADS--EKG)   Alendronate Sodium Other (See Comments)    REACTION: pt states INTOL to Fosamax   Verapamil Other (See Comments)    REACTION: Intol w/ bradycardia    Current Outpatient Medications  Medication Sig Dispense Refill   buPROPion (WELLBUTRIN XL) 150 MG 24 hr tablet Take 300 mg by mouth daily.     calcium-vitamin D (OSCAL WITH D) 500-200 MG-UNIT per tablet Take 1 tablet by mouth 2 (two) times daily.     LORazepam (ATIVAN) 0.5 MG tablet Take 0.5 mg by mouth 2 (two) times daily.     triamcinolone cream (KENALOG) 0.1 % Apply 1 Application topically 2 (two) times daily. To hand rash 80 g 2   amLODipine (NORVASC) 5 MG tablet TAKE 1 TABLET(5 MG) BY MOUTH DAILY 90 tablet 3   losartan (COZAAR) 100 MG tablet Take 1 tablet (100 mg total) by mouth daily. 90 tablet 3   Magnesium Oxide 250 MG TABS Take 1 tablet (250 mg total) by mouth daily after breakfast. (Patient not taking: Reported on 05/03/2023) 5 tablet 0   omeprazole (PRILOSEC) 20 MG  capsule Take 1 capsule (20 mg total) by mouth daily. (Patient not taking: Reported on 05/03/2023) 30 capsule 2   potassium chloride SA (KLOR-CON M) 20 MEQ tablet Take 1 tablet (20 mEq total) by mouth 2 (two) times daily. With food (Patient not taking: Reported on 05/03/2023) 10 tablet 0   No current facility-administered medications for this visit.    Social History   Socioeconomic History   Marital status: Married    Spouse name: sammy Leanos   Number of children: 1   Years of education: Not on file   Highest education level: Not on file  Occupational History   Occupation: Retired  Tobacco Use   Smoking status: Former    Current packs/day: 0.00    Average packs/day: 0.5 packs/day for 20.0 years (10.0 ttl pk-yrs)    Types: Cigarettes    Start date: 02/09/1970    Quit date: 02/09/1990    Years since quitting: 33.2   Smokeless tobacco: Never  Vaping Use   Vaping status: Never Used  Substance and Sexual Activity   Alcohol use: No    Alcohol/week: 0.0 standard drinks of alcohol   Drug use: No   Sexual activity: Yes  Other Topics Concern   Not on file  Social History Narrative  She lives her family, she has one child, retired, no smoke no drink   Social Drivers of Corporate investment banker Strain: Low Risk  (10/07/2022)   Overall Financial Resource Strain (CARDIA)    Difficulty of Paying Living Expenses: Not hard at all  Food Insecurity: No Food Insecurity (10/07/2022)   Hunger Vital Sign    Worried About Running Out of Food in the Last Year: Never true    Ran Out of Food in the Last Year: Never true  Transportation Needs: No Transportation Needs (10/07/2022)   PRAPARE - Administrator, Civil Service (Medical): No    Lack of Transportation (Non-Medical): No  Physical Activity: Sufficiently Active (10/07/2022)   Exercise Vital Sign    Days of Exercise per Week: 7 days    Minutes of Exercise per Session: 60 min  Stress: No Stress Concern Present (10/07/2022)    [employer] of Occupational Health - Occupational Stress Questionnaire    Feeling of Stress : Not at all  Social Connections: Moderately Integrated (10/07/2022)   Social Connection and Isolation Panel [NHANES]    Frequency of Communication with Friends and Family: More than three times a week    Frequency of Social Gatherings with Friends and Family: Once a week    Attends Religious Services: 1 to 4 times per year    Active Member of [employer] or Organizations: No    Attends Banker Meetings: Never    Marital Status: Married  Catering manager Violence: Not At Risk (10/07/2022)   Humiliation, Afraid, Rape, and Kick questionnaire    Fear of Current or Ex-Partner: No    Emotionally Abused: No    Physically Abused: No    Sexually Abused: No    Family History  Problem Relation Age of Onset   Rheum arthritis Mother    Stroke Mother    Heart disease Mother        Father, brother, sister   Lung cancer Father    Emphysema Father        PGF, brother, sister   COPD Father        PGF, brother, sister   Heart disease Father    Heart disease Sister    Alzheimer's disease Sister    Heart disease Brother    Ovarian cancer Paternal Aunt    Lung cancer Paternal Grandfather    Breast cancer Neg Hx     ROS General: Negative; No fevers, chills, or night sweats;  HEENT: Negative; No changes in vision or hearing, sinus congestion, difficulty swallowing Pulmonary: Stable lung nodule Cardiovascular: see HPI GI: Negative; No nausea, vomiting, diarrhea, or abdominal pain GU: Negative; No dysuria, hematuria, or difficulty voiding Musculoskeletal: she complains of chronic back pain; status post recent spinal injection Hematologic/Oncology: Negative; no easy bruising, bleeding Endocrine: Negative; no heat/cold intolerance; no diabetes Neuro: Negative; no changes in balance, headaches Skin: Negative; No rashes or skin lesions Psychiatric: Positive for anxiety/depression on  Wellbutrin Sleep: Negative; No snoring, daytime sleepiness, hypersomnolence, bruxism, restless legs, hypnogognic hallucinations, no cataplexy Other comprehensive 14 point system review is negative.   PE BP 136/77 (BP Location: Left Arm, Patient Position: Sitting, Cuff Size: Normal)   Pulse 75   Ht 5\' 4"  (1.626 m)   Wt 96 lb (43.5 kg)   SpO2 96%   BMI 16.48 kg/m    Repeat blood pressure by me was 118/78  Wt Readings from Last 3 Encounters:  05/03/23 96 lb (43.5 kg)  04/08/23 99  lb 6.4 oz (45.1 kg)  04/06/23 105 lb (47.6 kg)   General: Alert, oriented, no distress.  Skin: normal turgor, no rashes, warm and dry HEENT: Normocephalic, atraumatic. Pupils equal round and reactive to light; sclera anicteric; extraocular muscles intact;  Nose without nasal septal hypertrophy Mouth/Parynx benign; Mallinpatti scale 3 Neck: No JVD, no carotid bruits; normal carotid upstroke Lungs: clear to ausculatation and percussion; no wheezing or rales Chest wall: without tenderness to palpitation Heart: PMI not displaced, RRR, s1 s2 normal, 1/6 systolic murmur, no diastolic murmur, no rubs, gallops, thrills, or heaves Abdomen: soft, nontender; no hepatosplenomehaly, BS+; abdominal aorta nontender and not dilated by palpation. Back: no CVA tenderness Pulses 2+ Musculoskeletal: full range of motion, normal strength, no joint deformities Extremities: no clubbing cyanosis or edema, Homan's sign negative  Neurologic: grossly nonfocal; Cranial nerves grossly wnl Psychologic: Normal mood and affect    EKG Interpretation Date/Time:  Monday May 03 2023 11:28:21 EDT Ventricular Rate:  75 PR Interval:  140 QRS Duration:  62 QT Interval:  392 QTC Calculation: 437 R Axis:   -29  Text Interpretation: Normal sinus rhythm with sinus arrhythmia Low voltage QRS Inferior infarct , age undetermined When compared with ECG of 10-Nov-2022 10:33, QRS axis Shifted left Borderline criteria for Anterior infarct are  no longer Present Borderline criteria for Anterolateral infarct are no longer Present Inferior infarct is now Present Confirmed by Nicki Guadalajara ([id number]) on 05/07/2023 10:58:17 AM.   November 10, 2022 ECG (independently read by me): Normal sinus rhythm with mild sinus arrhythmia at 80 bpm  December 11, 2020  ECG (independently read by me):  Sinus rhythm at 61, sinus arrythmia  December 05, 2019 ECG (independently read by me): NSR at 87; no ectopy; normal intervals  January 10, 2019 ECG (independently read by me): Normal sinus rhythm at 89 bpm.  Low voltage.  Small Q-wave in lead III.  No ectopy.  Normal intervals.  April 11, 2018 ECG (independently read by me): Sinus bradycardia with mild sinus arrhythmia, rate 51.  Low voltage.  Normal intervals.  No ectopy.  August 31, 2017 ECG (independently read by me): Normal sinus rhythm 80 bpm.  No ectopy.  Mild RV conduction delay.  Normal intervals.  July 29, 2017 ECG (independently read by me): Bigeminy rhythm with alternating sinus and low atrial beat heart rate 60 bpm.  May 05, 2017 ECG (independently read by me): normal sinus rhythm at 62 with a PAC.  Low voltage.  Normal intervals.  October 2018 ECG (independently read by me): Sinus rhythm at 65 bpm, PAC.  PR interval 128 ms, QTc interval 428 ms.  September 2018 ECG (independently read by me): Normal sinus rhythm at 64 bpm, PA-C, mild RV conduction delay.  Nonspecific ST changes.  February 2017 ECG (independently read by me): Sinus bradycardia 53 bpm..  Mild RV conduction delay.  Normal intervals.  November 2017 ECG (independently read by me): Normal sinus rhythm at 61 bpm.  No ectopy.  Normal intervals.  July 2017 ECG (independently read by me): Sinus rhythm at 60 bpm with PAC.  Normal intervals.  November 2016 ECG (independently read by me): Normal sinus rhythm at 66 bpm.  Mild RV conduction delay.  Nondiagnostic small inferior Q waves.  ECG (independently read by me): sinus bradycardia 50  bpm.  Mild RV conduction delay  November 2015 ECG (independently read by me0;  Normal sinus rhythm at 77 bpm.  RV conduction delay.  Nondiagnostic small Q waves.  Nonspecific ST changes.  April 2015 ECG (independently read by me): Sinus bradycardia 54 beats per minute.  Mild RV conduction delay.  No ectopy.  LABS:    Latest Ref Rng & Units 04/08/2023   12:53 PM 04/02/2023    6:45 PM 11/05/2022   12:24 PM  BMP  Glucose 70 - 99 mg/dL 161  096  94   BUN 6 - 23 mg/dL 11  25  17    Creatinine 0.40 - 1.20 mg/dL 0.45  4.09  8.11   Sodium 135 - 145 mEq/L 140  134  139   Potassium 3.5 - 5.1 mEq/L 3.0  3.7  3.9   Chloride 96 - 112 mEq/L 101  101  101   CO2 19 - 32 mEq/L 28  22  27    Calcium 8.4 - 10.5 mg/dL 9.2  8.9  9.8       Latest Ref Rng & Units 04/08/2023   12:53 PM 04/02/2023    6:45 PM 11/05/2022   12:24 PM  Hepatic Function  Total Protein 6.0 - 8.3 g/dL 6.8  6.7  7.0   Albumin 3.5 - 5.2 g/dL 3.7  3.6  3.9   AST 0 - 37 U/L 23  31  23    ALT 0 - 35 U/L 14  19  13    Alk Phosphatase 39 - 117 U/L 66  56  65   Total Bilirubin 0.2 - 1.2 mg/dL 0.5  0.8  0.7       Latest Ref Rng & Units 04/08/2023   12:53 PM 04/02/2023    6:45 PM 11/05/2022   12:24 PM  CBC  WBC 4.0 - 10.5 K/uL 6.4  5.7  4.8   Hemoglobin 12.0 - 15.0 g/dL 91.4  78.2  95.6   Hematocrit 36.0 - 46.0 % 38.6  40.7  40.9   Platelets 150.0 - 400.0 K/uL 311.0  177  188    Lab Results  Component Value Date   MCV 93.3 04/08/2023   MCV 95.5 04/02/2023   MCV 94.9 11/05/2022   Lab Results  Component Value Date   TSH 3.960 11/05/2022  No results found for: "HGBA1C"  Lipid Panel     Component Value Date/Time   CHOL 197 11/05/2022 1224   CHOL 206 (H) 12/11/2020 0823   CHOL 203 (H) 05/22/2011 0759   TRIG 51 11/05/2022 1224   TRIG 65 05/22/2011 0759   HDL 83 11/05/2022 1224   HDL 98 12/11/2020 0823   HDL 88 (H) 05/22/2011 0759   CHOLHDL 2.4 11/05/2022 1224   VLDL 10 11/05/2022 1224   VLDL 13 05/22/2011 0759   LDLCALC 104  (H) 11/05/2022 1224   LDLCALC 98 12/11/2020 0823   LDLCALC 102 (H) 05/22/2011 0759   LDLDIRECT 102.2 04/23/2006 1211     RADIOLOGY: No results found.  IMPRESSION:  1. Essential hypertension   2. SVT (supraventricular tachycardia) (HCC)   3. Hyperlipidemia with target LDL less than 70   4. Carotid artery plaque, unspecified laterality   5. Coronary artery calcification seen on CT scan   6. Anxiety and depression     ASSESSMENT AND PLAN: Ms. Linsy Ehresman is a 78 year-old female who has a history of SVT as well as hypertension. Remotely she had experiencing more frequent palpitations which led to further dose escalation of Cardizem up to 240 mg with improvement and due to blood pressure issues losartan was titrated up to 100 mg.  She had developed bradycardia and an attempt was made to reduce diltiazem  but she felt this contributed to her increased blood pressure and did not feel well.  On diltiazem, low-dose metoprolol had been added but due to bradycardia this was discontinued.  Subsequently, she was started on amlodipine in place of diltiazem with improvement in blood pressure.  She has documented normal systolic and diastolic function with relatively normal valves on echocardiography in July 2021.  She does have mild LDL elevation with most recent laboratory at 104 in September 2024.  She did not tolerate statin and when I attempted to initiate Zetia apparently she never initiated this treatment.  Her blood pressure today is stable and on repeat by me was 118/78 on amlodipine 5 mg and losartan 100 mg daily.  She continues to be followed by Dr. Duncan Dull.  Remotely, she has been documented to have mild carotid plaque and coronary calcification on CT imaging.  Both she and her husband are aware of my upcoming retirement.  I will transition her to the care of Dr. Epifanio Lesches and arrange follow-up in 1 year.  He Lennette Bihari, MD, [facility]  05/07/2023 11:10 AM

## 2023-05-12 ENCOUNTER — Other Ambulatory Visit: Payer: Medicare Other

## 2023-05-13 ENCOUNTER — Ambulatory Visit (INDEPENDENT_AMBULATORY_CARE_PROVIDER_SITE_OTHER): Admitting: Dermatology

## 2023-05-13 ENCOUNTER — Encounter: Payer: Self-pay | Admitting: Dermatology

## 2023-05-13 DIAGNOSIS — C44612 Basal cell carcinoma of skin of right upper limb, including shoulder: Secondary | ICD-10-CM

## 2023-05-13 DIAGNOSIS — W908XXA Exposure to other nonionizing radiation, initial encounter: Secondary | ICD-10-CM

## 2023-05-13 DIAGNOSIS — D492 Neoplasm of unspecified behavior of bone, soft tissue, and skin: Secondary | ICD-10-CM

## 2023-05-13 DIAGNOSIS — L821 Other seborrheic keratosis: Secondary | ICD-10-CM

## 2023-05-13 DIAGNOSIS — L820 Inflamed seborrheic keratosis: Secondary | ICD-10-CM

## 2023-05-13 DIAGNOSIS — L578 Other skin changes due to chronic exposure to nonionizing radiation: Secondary | ICD-10-CM | POA: Diagnosis not present

## 2023-05-13 DIAGNOSIS — C4491 Basal cell carcinoma of skin, unspecified: Secondary | ICD-10-CM

## 2023-05-13 HISTORY — DX: Basal cell carcinoma of skin, unspecified: C44.91

## 2023-05-13 NOTE — Progress Notes (Signed)
 Follow-Up Visit   Subjective  Chloe Perkins is a 78 y.o. female who presents for the following: check spot L ant thigh ~ 18yr, irritated by pant leg, check scaly spot chest, ~2 wks, check spots arms, legs, just noticed, some itchy The patient has spots, moles and lesions to be evaluated, some may be new or changing and the patient may have concern these could be cancer.   The following portions of the chart were reviewed this encounter and updated as appropriate: medications, allergies, medical history  Review of Systems:  No other skin or systemic complaints except as noted in HPI or Assessment and Plan.  Objective  Well appearing patient in no apparent distress; mood and affect are within normal limits.   A focused examination was performed of the following areas: Arms, legs, chest  Relevant exam findings are noted in the Assessment and Plan.  L ant thigh x 1, L lower leg x 1, R lower post leg x 1, (3) Stuck on waxy paps with erythema R forearm 8.36mm pink scar like macule   Assessment & Plan   SEBORRHEIC KERATOSIS - Stuck-on, waxy, tan-brown papules and/or plaques  - Benign-appearing - Discussed benign etiology and prognosis. - Observe - Call for any changes  ACTINIC DAMAGE chest - chronic, secondary to cumulative UV radiation exposure/sun exposure over time - diffuse scaly erythematous macules with underlying dyspigmentation - Recommend daily broad spectrum sunscreen SPF 30+ to sun-exposed areas, reapply every 2 hours as needed.  - Recommend staying in the shade or wearing long sleeves, sun glasses (UVA+UVB protection) and wide brim hats (4-inch brim around the entire circumference of the hat). - Call for new or changing lesions.     INFLAMED SEBORRHEIC KERATOSIS (3) L ant thigh x 1, L lower leg x 1, R lower post leg x 1, (3) Symptomatic, irritating, patient would like treated. Destruction of lesion - L ant thigh x 1, L lower leg x 1, R lower post leg x 1,  (3) Complexity: simple   Destruction method: cryotherapy   Informed consent: discussed and consent obtained   Timeout:  patient name, date of birth, surgical site, and procedure verified Lesion destroyed using liquid nitrogen: Yes   Region frozen until ice ball extended beyond lesion: Yes   Cryo cycles: 1 or 2. Outcome: patient tolerated procedure well with no complications   Post-procedure details: wound care instructions given   NEOPLASM OF SKIN R forearm Skin / nail biopsy Type of biopsy: tangential   Informed consent: discussed and consent obtained   Timeout: patient name, date of birth, surgical site, and procedure verified   Procedure prep:  Patient was prepped and draped in usual sterile fashion Prep type:  Isopropyl alcohol Anesthesia: the lesion was anesthetized in a standard fashion   Anesthetic:  1% lidocaine w/ epinephrine 1-100,000 buffered w/ 8.4% NaHCO3 Instrument used: DermaBlade   Hemostasis achieved with: pressure and aluminum chloride   Outcome: patient tolerated procedure well   Post-procedure details: sterile dressing applied and wound care instructions given   Dressing type: bandage and bacitracin   Specimen 1 - Surgical pathology Differential Diagnosis: D48.5 AK r/o BCC  Check Margins: No 8.70mm pink scar like macule SEBORRHEIC KERATOSES   ACTINIC ELASTOSIS    Return for as scheduled for [facility]SE.  I, Ardis Rowan, RMA, am acting as scribe for Elie Goody, MD .   Documentation: I have reviewed the above documentation for accuracy and completeness, and I agree with the above.  Collin-Jamal  Katrinka Blazing, MD

## 2023-05-13 NOTE — Patient Instructions (Addendum)

## 2023-05-17 LAB — SURGICAL PATHOLOGY

## 2023-05-19 ENCOUNTER — Ambulatory Visit: Payer: Medicare Other | Admitting: Dermatology

## 2023-05-19 ENCOUNTER — Telehealth: Payer: Self-pay

## 2023-05-19 NOTE — Telephone Encounter (Signed)
 Patient advised of BX results and scheduled EDC with Dr. Gwen Pounds./ aw

## 2023-05-19 NOTE — Telephone Encounter (Addendum)
 Tried calling patient regarding results and to scheduled ED&C. No answer. Lm for patient to return call.    ----- Message from Willeen Niece sent at 05/18/2023  6:32 PM EDT ----- 1. Skin, R forearm :       SUPERFICIAL BASAL CELL CARCINOMA   Thin BCC skin cancer- needs EDC - please call patient

## 2023-05-20 ENCOUNTER — Telehealth: Payer: Self-pay

## 2023-05-20 NOTE — Telephone Encounter (Signed)
-----   Message from Willeen Niece sent at 05/18/2023  6:32 PM EDT ----- 1. Skin, R forearm :       SUPERFICIAL BASAL CELL CARCINOMA   Thin BCC skin cancer- needs EDC - please call patient

## 2023-05-26 ENCOUNTER — Ambulatory Visit: Admitting: Dermatology

## 2023-05-26 ENCOUNTER — Encounter: Payer: Self-pay | Admitting: Dermatology

## 2023-05-26 DIAGNOSIS — L578 Other skin changes due to chronic exposure to nonionizing radiation: Secondary | ICD-10-CM

## 2023-05-26 DIAGNOSIS — C44612 Basal cell carcinoma of skin of right upper limb, including shoulder: Secondary | ICD-10-CM | POA: Diagnosis not present

## 2023-05-26 DIAGNOSIS — W908XXA Exposure to other nonionizing radiation, initial encounter: Secondary | ICD-10-CM | POA: Diagnosis not present

## 2023-05-26 DIAGNOSIS — L820 Inflamed seborrheic keratosis: Secondary | ICD-10-CM

## 2023-05-26 DIAGNOSIS — L821 Other seborrheic keratosis: Secondary | ICD-10-CM

## 2023-05-26 NOTE — Patient Instructions (Addendum)
 Electrodesiccation and Curettage ("Scrape and Burn") Wound Care Instructions  Leave the original bandage on for 24 hours if possible.  If the bandage becomes soaked or soiled before that time, it is OK to remove it and examine the wound.  A small amount of post-operative bleeding is normal.  If excessive bleeding occurs, remove the bandage, place gauze over the site and apply continuous pressure (no peeking) over the area for 30 minutes. If this does not work, please call our clinic as soon as possible or page your doctor if it is after hours.   Once a day, cleanse the wound with soap and water. It is fine to shower. If a thick crust develops you may use a Q-tip dipped into dilute hydrogen peroxide (mix 1:1 with water) to dissolve it.  Hydrogen peroxide can slow the healing process, so use it only as needed.    After washing, apply petroleum jelly (Vaseline) or an antibiotic ointment if your doctor prescribed one for you, followed by a bandage.    For best healing, the wound should be covered with a layer of ointment at all times. If you are not able to keep the area covered with a bandage to hold the ointment in place, this may mean re-applying the ointment several times a day.  Continue this wound care until the wound has healed and is no longer open. It may take several weeks for the wound to heal and close.  Itching and mild discomfort is normal during the healing process.  If you have any discomfort, you can take Tylenol (acetaminophen) or ibuprofen as directed on the bottle. (Please do not take these if you have an allergy to them or cannot take them for another reason).  Some redness, tenderness and white or yellow material in the wound is normal healing.  If the area becomes very sore and red, or develops a thick yellow-green material (pus), it may be infected; please notify us.    Wound healing continues for up to one year following surgery. It is not unusual to experience pain in the scar  from time to time during the interval.  If the pain becomes severe or the scar thickens, you should notify the office.    A slight amount of redness in a scar is expected for the first six months.  After six months, the redness will fade and the scar will soften and fade.  The color difference becomes less noticeable with time.  If there are any problems, return for a post-op surgery check at your earliest convenience.  To improve the appearance of the scar, you can use silicone scar gel, cream, or sheets (such as Mederma or Serica) every night for up to one year. These are available over the counter (without a prescription).  Please call our office at [phone] for any questions or concerns.   Seborrheic Keratosis  What causes seborrheic keratoses? Seborrheic keratoses are harmless, common skin growths that first appear during adult life.  As time goes by, more growths appear.  Some people may develop a large number of them.  Seborrheic keratoses appear on both covered and uncovered body parts.  They are not caused by sunlight.  The tendency to develop seborrheic keratoses can be inherited.  They vary in color from skin-colored to gray, brown, or even black.  They can be either smooth or have a rough, warty surface.   Seborrheic keratoses are superficial and look as if they were stuck on the skin.  Under  the microscope this type of keratosis looks like layers upon layers of skin.  That is why at times the top layer may seem to fall off, but the rest of the growth remains and re-grows.    Treatment Seborrheic keratoses do not need to be treated, but can easily be removed in the office.  Seborrheic keratoses often cause symptoms when they rub on clothing or jewelry.  Lesions can be in the way of shaving.  If they become inflamed, they can cause itching, soreness, or burning.  Removal of a seborrheic keratosis can be accomplished by freezing, burning, or surgery. If any spot bleeds, scabs, or  grows rapidly, please return to have it checked, as these can be an indication of a skin cancer.       Due to recent changes in healthcare laws, you may see results of your pathology and/or laboratory studies on MyChart before the doctors have had a chance to review them. We understand that in some cases there may be results that are confusing or concerning to you. Please understand that not all results are received at the same time and often the doctors may need to interpret multiple results in order to provide you with the best plan of care or course of treatment. Therefore, we ask that you please give [place] 2 business days to thoroughly review all your results before contacting the office for clarification. Should we see a critical lab result, you will be contacted sooner.   If You Need Anything After Your Visit  If you have any questions or concerns for your doctor, please call our main line at [phone] and press option 4 to reach your doctor's medical assistant. If no one answers, please leave a voicemail as directed and we will return your call as soon as possible. Messages left after 4 pm will be answered the following business day.   You may also send [place] a message via MyChart. We typically respond to MyChart messages within 1-2 business days.  For prescription refills, please ask your pharmacy to contact our office. Our fax number is [phone].  If you have an urgent issue when the clinic is closed that cannot wait until the next business day, you can page your doctor at the number below.    Please note that while we do our best to be available for urgent issues outside of office hours, we are not available 24/7.   If you have an urgent issue and are unable to reach [place], you may choose to seek medical care at your doctor's office, retail clinic, urgent care center, or emergency room.  If you have a medical emergency, please immediately call 911 or go to the emergency  department.  Pager Numbers  - Dr. Gwen Pounds: [phone]  - Dr. Roseanne Reno: [phone]  - Dr. Katrinka Blazing: [phone]   In the event of inclement weather, please call our main line at [phone] for an update on the status of any delays or closures.  Dermatology Medication Tips: Please keep the boxes that topical medications come in in order to help keep track of the instructions about where and how to use these. Pharmacies typically print the medication instructions only on the boxes and not directly on the medication tubes.   If your medication is too expensive, please contact our office at [phone] option 4 or send [place] a message through MyChart.   We are unable to tell what your co-pay for medications will be in advance as this is different depending  on your insurance coverage. However, we may be able to find a substitute medication at lower cost or fill out paperwork to get insurance to cover a needed medication.   If a prior authorization is required to get your medication covered by your insurance company, please allow [place] 1-2 business days to complete this process.  Drug prices often vary depending on where the prescription is filled and some pharmacies may offer cheaper prices.  The website www.goodrx.com contains coupons for medications through different pharmacies. The prices here do not account for what the cost may be with help from insurance (it may be cheaper with your insurance), but the website can give you the price if you did not use any insurance.  - You can print the associated coupon and take it with your prescription to the pharmacy.  - You may also stop by our office during regular business hours and pick up a GoodRx coupon card.  - If you need your prescription sent electronically to a different pharmacy, notify our office through [facility] or by phone at [phone] option 4.     Si Usted Necesita Algo Despus de Su Visita  Tambin puede enviarnos  un mensaje a travs de Clinical cytogeneticist. Por lo general respondemos a los mensajes de MyChart en el transcurso de 1 a 2 das hbiles.  Para renovar recetas, por favor pida a su farmacia que se ponga en contacto con nuestra oficina. Annie Sable de fax es Marie [phone].  Si tiene un asunto urgente cuando la clnica est cerrada y que no puede esperar hasta el siguiente da hbil, puede llamar/localizar a su doctor(a) al nmero que aparece a continuacin.   Por favor, tenga en cuenta que aunque hacemos todo lo posible para estar disponibles para asuntos urgentes fuera del horario de [place], no estamos disponibles las 24 horas del da, los 7 [address]  [address] de la [place].   Si tiene un problema urgente y no puede comunicarse con nosotros, puede optar por buscar atencin mdica  en el consultorio de su doctor(a), en una clnica privada, en un centro de atencin urgente o en una sala de emergencias.  Si tiene Engineer, drilling, por favor llame inmediatamente al 911 o vaya a la sala de emergencias.  Nmeros de bper  - Dr. Gwen Pounds: [phone]  - Dra. Roseanne Reno: [phone]  - Dr. Katrinka Blazing: [phone]   En caso de inclemencias del tiempo, por favor llame a Lacy Duverney principal al [phone] para una actualizacin sobre el [place] de cualquier retraso o cierre.  Consejos para la medicacin en dermatologa: Por favor, guarde las cajas en las que vienen los medicamentos de uso tpico para ayudarle a seguir las instrucciones sobre dnde y cmo usarlos. Las farmacias generalmente imprimen las instrucciones del medicamento slo en las cajas y no directamente en los tubos del [employer].   Si su medicamento es muy caro, por favor, pngase en contacto con Rolm Gala llamando al [phone] y presione la opcin 4 o envenos un mensaje a travs de Clinical cytogeneticist.   No podemos decirle cul ser su copago por los medicamentos por adelantado ya que esto es diferente dependiendo de la cobertura de su seguro. Sin  embargo, es posible que podamos encontrar un medicamento sustituto a Audiological scientist un formulario para que el seguro cubra el medicamento que se considera necesario.   Si se requiere una autorizacin previa para que su compaa de seguros [place] su medicamento, por favor permtanos de 1 a 2 das hbiles para [employer]  proceso.  Los precios de los medicamentos varan con frecuencia dependiendo del Environmental consultant de dnde se surte la receta y alguna farmacias pueden ofrecer precios ms baratos.  El sitio web www.goodrx.com tiene cupones para medicamentos de Health and safety inspector. Los precios aqu no tienen en cuenta lo que podra costar con la ayuda del seguro (puede ser ms barato con su seguro), pero el sitio web puede darle el precio si no utiliz Tourist information centre manager.  - Puede imprimir el cupn correspondiente y llevarlo con su receta a la farmacia.  - Tambin puede pasar por nuestra oficina durante el horario de atencin regular y Education officer, museum una tarjeta de cupones de GoodRx.  - Si necesita que su receta se enve electrnicamente a una farmacia diferente, informe a nuestra oficina a travs de MyChart de [place] o por telfono llamando al [phone] y presione la opcin 4.

## 2023-05-26 NOTE — Progress Notes (Signed)
 Follow-Up Visit   Subjective  Chloe Perkins is a 78 y.o. female who presents for the following:  Here for treatment of bx proven bcc at right forearm. Patient also reports she would like several spots at left and right lower legs and left thigh checked today. Areas were previously treated at beginning of April with cryotherapy.   The patient has spots, moles and lesions to be evaluated, some may be new or changing and the patient may have concern these could be cancer.  The following portions of the chart were reviewed this encounter and updated as appropriate: medications, allergies, medical history  Review of Systems:  No other skin or systemic complaints except as noted in HPI or Assessment and Plan.  Objective  Well appearing patient in no apparent distress; mood and affect are within normal limits.  A focused examination was performed of the following areas: Right forearm, right leg, left leg, left thigh  Relevant exam findings are noted in the Assessment and Plan.  right forearm Healing bx scar Left anterior thigh x 1, left lower leg x 1, right lower posterior leg x 1 (3) Erythematous stuck-on, waxy papule or plaque  Assessment & Plan   SEBORRHEIC KERATOSIS - Stuck-on, waxy, tan-brown papules and/or plaques  - Benign-appearing - Discussed benign etiology and prognosis. - Observe - Call for any changes   ACTINIC DAMAGE - chronic, secondary to cumulative UV radiation exposure/sun exposure over time - diffuse scaly erythematous macules with underlying dyspigmentation - Recommend daily broad spectrum sunscreen SPF 30+ to sun-exposed areas, reapply every 2 hours as needed.  - Recommend staying in the shade or wearing long sleeves, sun glasses (UVA+UVB protection) and wide brim hats (4-inch brim around the entire circumference of the hat). - Call for new or changing lesions.  BASAL CELL CARCINOMA (BCC) OF RIGHT FOREARM right forearm Destruction of  lesion Complexity: extensive   Destruction method: electrodesiccation and curettage   Informed consent: discussed and consent obtained   Timeout:  patient name, date of birth, surgical site, and procedure verified Procedure prep:  Patient was prepped and draped in usual sterile fashion Prep type:  Isopropyl alcohol Anesthesia: the lesion was anesthetized in a standard fashion   Anesthetic:  1% lidocaine w/ epinephrine 1-100,000 buffered w/ 8.4% NaHCO3 Curettage performed in three different directions: Yes   Electrodesiccation performed over the curetted area: Yes   Final wound size (cm):  1.5 Hemostasis achieved with:  pressure, aluminum chloride and electrodesiccation Outcome: patient tolerated procedure well with no complications   Post-procedure details: sterile dressing applied and wound care instructions given   Dressing type: bandage and petrolatum   Bx proven SUPERFICIAL BASAL CELL CARCINOMA at right forearm  Refer to previous pathology  05/13/2023 Accession: [id number]  ED&C done today    INFLAMED SEBORRHEIC KERATOSIS (3) Left anterior thigh x 1, left lower leg x 1, right lower posterior leg x 1 (3) Persistent ISKs - retreated today  Symptomatic, irritating, patient would like treated. Destruction of lesion - Left anterior thigh x 1, left lower leg x 1, right lower posterior leg x 1 (3) Complexity: simple   Destruction method: cryotherapy   Informed consent: discussed and consent obtained   Timeout:  patient name, date of birth, surgical site, and procedure verified Lesion destroyed using liquid nitrogen: Yes   Region frozen until ice ball extended beyond lesion: Yes   Outcome: patient tolerated procedure well with no complications   Post-procedure details: wound care instructions given   ACTINIC  SKIN DAMAGE   SEBORRHEIC KERATOSIS    Return for keep follow up as scheduled .  IRandee Busing, CMA, am acting as scribe for Celine Collard, MD.   Documentation: I  have reviewed the above documentation for accuracy and completeness, and I agree with the above.  Celine Collard, MD

## 2023-06-15 ENCOUNTER — Telehealth: Payer: Self-pay

## 2023-06-15 NOTE — Telephone Encounter (Signed)
-----   Message from Willeen Niece sent at 05/18/2023  6:32 PM EDT ----- 1. Skin, R forearm :       SUPERFICIAL BASAL CELL CARCINOMA   Thin BCC skin cancer- needs EDC - please call patient

## 2023-06-16 NOTE — Telephone Encounter (Signed)
 This patient was called shortly after BX results and EDC has been completed by Dr. Bary Likes. aw

## 2023-07-14 ENCOUNTER — Ambulatory Visit

## 2023-07-14 ENCOUNTER — Telehealth: Payer: Self-pay

## 2023-07-14 DIAGNOSIS — E538 Deficiency of other specified B group vitamins: Secondary | ICD-10-CM | POA: Diagnosis not present

## 2023-07-14 MED ORDER — CYANOCOBALAMIN 1000 MCG/ML IJ SOLN
1000.0000 ug | Freq: Once | INTRAMUSCULAR | Status: AC
  Administered 2023-07-14: 1000 ug via INTRAMUSCULAR

## 2023-07-14 NOTE — Progress Notes (Signed)
 Pt received B12 injection in Left  deltoid muscle. Pt tolerated it well with no complaints or concerns.

## 2023-07-14 NOTE — Telephone Encounter (Signed)
 Pt is scheduled for a b12 injection today. Pt has not received a b12 injection in our office since 01/2023. Last office note states that pt has been treated monthly for b12 deficiency. Does pt need to continue with monthly b12 injections?

## 2023-08-11 ENCOUNTER — Ambulatory Visit

## 2023-08-11 DIAGNOSIS — E538 Deficiency of other specified B group vitamins: Secondary | ICD-10-CM | POA: Diagnosis not present

## 2023-08-11 MED ORDER — CYANOCOBALAMIN 1000 MCG/ML IJ SOLN
1000.0000 ug | Freq: Once | INTRAMUSCULAR | Status: AC
  Administered 2023-08-11: 1000 ug via INTRAMUSCULAR

## 2023-08-11 NOTE — Progress Notes (Signed)
Pt received B12 injection in right deltoid. Pt tolerated it well with no complaints or concerns. 

## 2023-08-24 ENCOUNTER — Ambulatory Visit: Payer: Medicare Other | Admitting: Internal Medicine

## 2023-09-13 ENCOUNTER — Ambulatory Visit

## 2023-09-13 DIAGNOSIS — E538 Deficiency of other specified B group vitamins: Secondary | ICD-10-CM

## 2023-09-13 MED ORDER — CYANOCOBALAMIN 1000 MCG/ML IJ SOLN
1000.0000 ug | Freq: Once | INTRAMUSCULAR | Status: AC
  Administered 2023-09-13: 1000 ug via INTRAMUSCULAR

## 2023-09-13 NOTE — Progress Notes (Signed)
 Pt presented for their vitamin B12 injection. Pt was identified through two identifiers. Pt tolerated shot well in their left deltoid.

## 2023-10-18 ENCOUNTER — Ambulatory Visit

## 2023-10-19 ENCOUNTER — Ambulatory Visit

## 2023-10-21 ENCOUNTER — Encounter: Payer: Self-pay | Admitting: Dermatology

## 2023-10-21 ENCOUNTER — Ambulatory Visit (INDEPENDENT_AMBULATORY_CARE_PROVIDER_SITE_OTHER): Payer: Medicare Other | Admitting: Dermatology

## 2023-10-21 DIAGNOSIS — L814 Other melanin hyperpigmentation: Secondary | ICD-10-CM | POA: Diagnosis not present

## 2023-10-21 DIAGNOSIS — L820 Inflamed seborrheic keratosis: Secondary | ICD-10-CM

## 2023-10-21 DIAGNOSIS — Z1283 Encounter for screening for malignant neoplasm of skin: Secondary | ICD-10-CM

## 2023-10-21 DIAGNOSIS — D229 Melanocytic nevi, unspecified: Secondary | ICD-10-CM

## 2023-10-21 DIAGNOSIS — Z85820 Personal history of malignant melanoma of skin: Secondary | ICD-10-CM

## 2023-10-21 DIAGNOSIS — W908XXA Exposure to other nonionizing radiation, initial encounter: Secondary | ICD-10-CM

## 2023-10-21 DIAGNOSIS — Z86018 Personal history of other benign neoplasm: Secondary | ICD-10-CM

## 2023-10-21 DIAGNOSIS — L570 Actinic keratosis: Secondary | ICD-10-CM | POA: Diagnosis not present

## 2023-10-21 DIAGNOSIS — L578 Other skin changes due to chronic exposure to nonionizing radiation: Secondary | ICD-10-CM

## 2023-10-21 DIAGNOSIS — D1801 Hemangioma of skin and subcutaneous tissue: Secondary | ICD-10-CM

## 2023-10-21 DIAGNOSIS — L821 Other seborrheic keratosis: Secondary | ICD-10-CM

## 2023-10-21 DIAGNOSIS — D692 Other nonthrombocytopenic purpura: Secondary | ICD-10-CM

## 2023-10-21 DIAGNOSIS — Z85828 Personal history of other malignant neoplasm of skin: Secondary | ICD-10-CM

## 2023-10-21 NOTE — Patient Instructions (Signed)

## 2023-10-21 NOTE — Progress Notes (Signed)
 Follow-Up Visit   Subjective  Chloe Perkins is a 78 y.o. female who presents for the following: Skin Cancer Screening and Full Body Skin Exam  The patient presents for Total-Body Skin Exam (TBSE) for skin cancer screening and mole check. The patient has spots, moles and lesions to be evaluated, some may be new or changing and the patient may have concern these could be cancer.  Hx BCC, DN, MM. Spot at side of face, present for a few weeks, one at right neck, chest.   The following portions of the chart were reviewed this encounter and updated as appropriate: medications, allergies, medical history  Review of Systems:  No other skin or systemic complaints except as noted in HPI or Assessment and Plan.  Objective  Well appearing patient in no apparent distress; mood and affect are within normal limits.  A full examination was performed including scalp, head, eyes, ears, nose, lips, neck, chest, axillae, abdomen, back, buttocks, bilateral upper extremities, bilateral lower extremities, hands, feet, fingers, toes, fingernails, and toenails. All findings within normal limits unless otherwise noted below.   Relevant physical exam findings are noted in the Assessment and Plan.  face & chest x 12 (12) Erythematous thin papules/macules with gritty scale.  R medial thigh x 1, R antecubital x 1, R neck x  1, L lower leg x 1 (4) Erythematous stuck-on, waxy papule or plaque  Assessment & Plan   SKIN CANCER SCREENING PERFORMED TODAY.  ACTINIC DAMAGE - Chronic condition, secondary to cumulative UV/sun exposure - diffuse scaly erythematous macules with underlying dyspigmentation - Recommend daily broad spectrum sunscreen SPF 30+ to sun-exposed areas, reapply every 2 hours as needed.  - Staying in the shade or wearing long sleeves, sun glasses (UVA+UVB protection) and wide brim hats (4-inch brim around the entire circumference of the hat) are also recommended for sun protection.  - Call for  new or changing lesions.  LENTIGINES, SEBORRHEIC KERATOSES, HEMANGIOMAS - Benign normal skin lesions - Benign-appearing - Call for any changes  MELANOCYTIC NEVI - Tan-brown and/or pink-flesh-colored symmetric macules and papules - Benign appearing on exam today - Observation - Call clinic for new or changing moles - Recommend daily use of broad spectrum spf 30+ sunscreen to sun-exposed areas.   HISTORY OF DYSPLASTIC NEVI - No evidence of recurrence today - Recommend regular full body skin exams - Recommend daily broad spectrum sunscreen SPF 30+ to sun-exposed areas, reapply every 2 hours as needed.  - Call if any new or changing lesions are noted between office visits    HISTORY OF BASAL CELL CARCINOMA OF THE SKIN - No evidence of recurrence today - Recommend regular full body skin exams - Recommend daily broad spectrum sunscreen SPF 30+ to sun-exposed areas, reapply every 2 hours as needed.  - Call if any new or changing lesions are noted between office visits    HISTORY OF MELANOMA Right posterior shoulder 1999 - No evidence of recurrence today - No lymphadenopathy - Recommend regular full body skin exams - Recommend daily broad spectrum sunscreen SPF 30+ to sun-exposed areas, reapply every 2 hours as needed.  - Call if any new or changing lesions are noted between office visits   Purpura - Chronic; persistent and recurrent.  Treatable, but not curable. - Violaceous macules and patches at arms - Benign - Related to trauma, age, sun damage and/or use of blood thinners, chronic use of topical and/or oral steroids - Observe - Can use OTC arnica containing moisturizer such  as Dermend Bruise Formula if desired - Call for worsening or other concerns  AK (ACTINIC KERATOSIS) (12) face & chest x 12 (12) Actinic keratoses are precancerous spots that appear secondary to cumulative UV radiation exposure/sun exposure over time. They are chronic with expected duration over 1 year. A  portion of actinic keratoses will progress to squamous cell carcinoma of the skin. It is not possible to reliably predict which spots will progress to skin cancer and so treatment is recommended to prevent development of skin cancer.  Recommend daily broad spectrum sunscreen SPF 30+ to sun-exposed areas, reapply every 2 hours as needed.  Recommend staying in the shade or wearing long sleeves, sun glasses (UVA+UVB protection) and wide brim hats (4-inch brim around the entire circumference of the hat). Call for new or changing lesions. Destruction of lesion - face & chest x 12 (12) Complexity: simple   Destruction method: cryotherapy   Informed consent: discussed and consent obtained   Timeout:  patient name, date of birth, surgical site, and procedure verified Lesion destroyed using liquid nitrogen: Yes   Region frozen until ice ball extended beyond lesion: Yes   Outcome: patient tolerated procedure well with no complications   Post-procedure details: wound care instructions given    INFLAMED SEBORRHEIC KERATOSIS (4) R medial thigh x 1, R antecubital x 1, R neck x  1, L lower leg x 1 (4) Symptomatic, irritating, patient would like treated.  Benign-appearing.  Call clinic for new or changing lesions.   Destruction of lesion - R medial thigh x 1, R antecubital x 1, R neck x  1, L lower leg x 1 (4) Complexity: simple   Destruction method: cryotherapy   Informed consent: discussed and consent obtained   Timeout:  patient name, date of birth, surgical site, and procedure verified Lesion destroyed using liquid nitrogen: Yes   Region frozen until ice ball extended beyond lesion: Yes   Outcome: patient tolerated procedure well with no complications   Post-procedure details: wound care instructions given    SKIN CANCER SCREENING   HISTORY OF DYSPLASTIC NEVUS   HISTORY OF MALIGNANT MELANOMA   HISTORY OF BASAL CELL CARCINOMA   ACTINIC SKIN DAMAGE   LENTIGO   MELANOCYTIC NEVUS,  UNSPECIFIED LOCATION   PURPURA (HCC)   Return in about 1 year (around 10/20/2024) for TBSE, with Dr. MARLA, HxAK, HxBCC, HxDN, HxMM.  LILLETTE Lonell Drones, RMA, am acting as scribe for Alm Rhyme, MD .   Documentation: I have reviewed the above documentation for accuracy and completeness, and I agree with the above.  Alm Rhyme, MD

## 2023-10-26 ENCOUNTER — Ambulatory Visit: Admitting: Obstetrics and Gynecology

## 2023-11-01 ENCOUNTER — Telehealth: Payer: Self-pay

## 2023-11-01 NOTE — Telephone Encounter (Signed)
 Copied from CRM [id number]. Topic: Clinical - Prescription Issue >> Nov 01, 2023 11:36 AM Mercedes MATSU wrote: Reason for CRM: Patient called in requesting to speak with the clinic directly and did not want to speak with me. Patient wants Dr. Marylynn to call back because they need to know if her Genvia was increased. She said it was supposed to be 50mg  but the rx says 25mg . Requesting urgent call back at [phone].

## 2023-11-02 NOTE — Telephone Encounter (Signed)
Attempted to call. No answer. No voicemail.

## 2023-11-02 NOTE — Telephone Encounter (Signed)
 Spoke with pt and she stated that she does not have any issues with her medication. Pt stated that it was about her husbands medication.

## 2023-11-03 ENCOUNTER — Ambulatory Visit: Admitting: *Deleted

## 2023-11-03 VITALS — Ht 63.0 in | Wt 100.0 lb

## 2023-11-03 DIAGNOSIS — Z0000 Encounter for general adult medical examination without abnormal findings: Secondary | ICD-10-CM | POA: Diagnosis not present

## 2023-11-03 DIAGNOSIS — Z1231 Encounter for screening mammogram for malignant neoplasm of breast: Secondary | ICD-10-CM

## 2023-11-03 NOTE — Patient Instructions (Signed)
 Ms. Zinger,  Thank you for taking the time for your Medicare Wellness Visit. I appreciate your continued commitment to your health goals. Please review the care plan we discussed, and feel free to reach out if I can assist you further.  Medicare recommends these wellness visits once per year to help you and your care team stay ahead of potential health issues. These visits are designed to focus on prevention, allowing your provider to concentrate on managing your acute and chronic conditions during your regular appointments.  Please note that Annual Wellness Visits do not include a physical exam. Some assessments may be limited, especially if the visit was conducted virtually. If needed, we may recommend a separate in-person follow-up with your provider.  Ongoing Care Seeing your primary care provider every 3 to 6 months helps us  monitor your health and provide consistent, personalized care.  Remember to update your flu, covid and shingles vaccines. Remember to check you calendar and schedule a follow-up appointment with Dr. Marylynn.   You have an order for:  []   2D Mammogram  [x]   3D Mammogram  []   Bone Density     Please call for appointment:  [facility]  [address] [place] [address] [phone]     Make sure to wear two-piece clothing.  No lotions, powders, or deodorants the day of the appointment. Make sure to bring picture ID and insurance card.  Bring list of medications you are currently taking including any supplements.    Referrals If a referral was made during today's visit and you haven't received any updates within two weeks, please contact the referred provider directly to check on the status.  Recommended Screenings:  Health Maintenance  Topic Date Due   Zoster (Shingles) Vaccine (1 of 2) Never done   Flu Shot  09/10/2023   COVID-19 Vaccine (4 - 2025-26 season) 10/11/2023   Breast Cancer Screening   11/27/2023   Medicare Annual Wellness Visit  11/02/2024   DTaP/Tdap/Td vaccine (2 - Td or Tdap) 07/08/2025   Pneumococcal Vaccine for age over 81  Completed   DEXA scan (bone density measurement)  Completed   HPV Vaccine  Aged Out   Meningitis B Vaccine  Aged Out   Colon Cancer Screening  Discontinued   Hepatitis C Screening  Discontinued       11/03/2023    9:00 AM  Advanced Directives  Does Patient Have a Medical Advance Directive? Yes  Type of Estate agent of [place];Living will  Copy of Healthcare Power of Attorney in Chart? No - copy requested   Advance Care Planning is important because it: Ensures you receive medical care that aligns with your values, goals, and preferences. Provides guidance to your family and loved ones, reducing the emotional burden of decision-making during critical moments.  Vision: Annual vision screenings are recommended for early detection of glaucoma, cataracts, and diabetic retinopathy. These exams can also reveal signs of chronic conditions such as diabetes and high blood pressure.  Dental: Annual dental screenings help detect early signs of oral cancer, gum disease, and other conditions linked to overall health, including heart disease and diabetes.  Please see the attached documents for additional preventive care recommendations.

## 2023-11-03 NOTE — Progress Notes (Signed)
 Subjective:   Chloe Perkins is a 78 y.o. who presents for a Medicare Wellness preventive visit.  As a reminder, Annual Wellness Visits don't include a physical exam, and some assessments may be limited, especially if this visit is performed virtually. We may recommend an in-person follow-up visit with your provider if needed.  Visit Complete: Virtual I connected with  Chloe Perkins on 11/03/23 by a audio enabled telemedicine application and verified that I am speaking with the correct person using two identifiers.  Patient Location: Home  Provider Location: Home Office  I discussed the limitations of evaluation and management by telemedicine. The patient expressed understanding and agreed to proceed.  Vital Signs: Because this visit was a virtual/telehealth visit, some criteria may be missing or patient reported. Any vitals not documented were not able to be obtained and vitals that have been documented are patient reported.  VideoDeclined- This patient declined Librarian, academic. Therefore the visit was completed with audio only.  Persons Participating in Visit: Patient.  AWV Questionnaire: No: Patient Medicare AWV questionnaire was not completed prior to this visit.  Cardiac Risk Factors include: advanced age (>21men, >38 women);hypertension;dyslipidemia;Other (see comment), Risk factor comments: CAD     Objective:    Today's Vitals   11/03/23 0850  Weight: 100 lb (45.4 kg)  Height: 5' 3 (1.6 m)   Body mass index is 17.71 kg/m.     11/03/2023    9:00 AM 04/02/2023    6:44 PM 10/07/2022   10:41 AM 09/13/2021    7:03 PM 02/21/2021   10:37 AM 02/21/2020    9:35 AM 10/03/2018    4:24 PM  Advanced Directives  Does Patient Have a Medical Advance Directive? Yes No Yes;No No Yes Yes No  Type of Estate agent of [place];Living will    Healthcare Power of [place];Living will Healthcare Power of [place];Living will   Does  patient want to make changes to medical advance directive?   Yes (MAU/Ambulatory/Procedural Areas - Information given)  No - Patient declined No - Patient declined   Copy of Healthcare Power of Attorney in Chart? No - copy requested    No - copy requested No - copy requested   Would patient like information on creating a medical advance directive?  No - Patient declined     Yes (ED - Information included in AVS)    Current Medications (verified) Outpatient Encounter Medications as of 11/03/2023  Medication Sig   amLODipine  (NORVASC ) 5 MG tablet TAKE 1 TABLET(5 MG) BY MOUTH DAILY   buPROPion (WELLBUTRIN XL) 150 MG 24 hr tablet Take 300 mg by mouth daily.   calcium -vitamin D  (OSCAL WITH D) 500-200 MG-UNIT per tablet Take 1 tablet by mouth 2 (two) times daily.   LORazepam  (ATIVAN ) 0.5 MG tablet Take 0.5 mg by mouth 2 (two) times daily.   losartan  (COZAAR ) 100 MG tablet Take 1 tablet (100 mg total) by mouth daily.   triamcinolone  cream (KENALOG ) 0.1 % Apply 1 Application topically 2 (two) times daily. To hand rash (Patient taking differently: Apply 1 Application topically 2 (two) times daily. To hand rash as needed)   No facility-administered encounter medications on file as of 11/03/2023.    Allergies (verified) Codeine , Macrobid  [nitrofurantoin ], Metoprolol , Adhesive [tape], Alendronate sodium, and Verapamil   History: Past Medical History:  Diagnosis Date   Actinic keratosis    Anxiety and depression    Arthritis    Asthmatic bronchitis    Atypical nevus  01/27/2011   right side abdomen-severe atypia   Atypical nevus 11/09/2012   moderate atypia-left upper back   Atypical nevus 11/19/2012   moderate atypia-left forearm   BCC (basal cell carcinoma of skin) 05/13/2023   right forearm - ED&C done 05/26/2023   BCC (basal cell carcinoma) 10/23/2002   left bridge nose-Bcc with sclerosis   Bronchitis    history of   Cancer (HCC)    MELANOMA   Depression    Dysrhythmia    Heart murmur     Hemorrhoids    History of melanoma    HLD (hyperlipidemia)    Hypertension    Melanoma (HCC) 10/18/1997   right post sholder-melanoma level 1   Mitral valve prolapse    Osteoporosis    Personal history of malignant melanoma of skin    Superficial basal cell carcinoma (BCC) 01/10/2013   right cheek   SVT (supraventricular tachycardia)    SVT (supraventricular tachycardia) 05/29/2013   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   Viral gastroenteritis    Past Surgical History:  Procedure Laterality Date   ABDOMINAL HYSTERECTOMY  1975   CATARACT EXTRACTION W/PHACO Right 02/10/2017   Procedure: CATARACT EXTRACTION PHACO AND INTRAOCULAR LENS PLACEMENT (IOC);  Surgeon: Jaye Fallow, MD;  Location: [facility] ORS;  Service: Ophthalmology;  Laterality: Right;  US  00:35.9 AP% 11.5 CDE 4.11 Fluid Pack Lot # [id number]   CATARACT EXTRACTION W/PHACO Left 03/09/2017   Procedure: CATARACT EXTRACTION PHACO AND INTRAOCULAR LENS PLACEMENT (IOC);  Surgeon: Jaye Fallow, MD;  Location: [facility] ORS;  Service: Ophthalmology;  Laterality: Left;  US  00:23 AP% 16.3 CDE 3.85 Fluid pack lot # [id number]   COLONOSCOPY  04/15/2004   2006: Normal   ENDOMETRIAL ABLATION  1976   left thumb tendon repair  10/09   MELANOMA EXCISION  1999   superficial spreading melanoma right post shoulder   NM MYOCAR PERF WALL MOTION  12/28/2008   No ischemia   SKIN CANCER EXCISION  03/02/2013   superficial basal cell cancer   US  ECHOCARDIOGRAPHY  10/16/2010   mitral valve leaflets midly thickened,trace MR.   WRIST SURGERY Left 2011   Family History  Problem Relation Age of Onset   Rheum arthritis Mother    Stroke Mother    Heart disease Mother        Father, brother, sister   Lung cancer Father    Emphysema Father        PGF, brother, sister   COPD Father        PGF, brother, sister   Heart disease Father    Heart disease  Sister    Alzheimer's disease Sister    Heart disease Brother    Ovarian cancer Paternal Aunt    Lung cancer Paternal Grandfather    Breast cancer Neg Hx    Social History   Socioeconomic History   Marital status: Married    Spouse name: sammy Mcfayden   Number of children: 1   Years of education: Not on file   Highest education level: Not on file  Occupational History   Occupation: Retired  Tobacco Use   Smoking status: Former    Current packs/day: 0.00    Average packs/day: 0.5 packs/day for 20.0 years (10.0 ttl pk-yrs)    Types: Cigarettes    Start date: 02/09/1970    Quit date: 02/09/1990    Years since quitting: 33.7   Smokeless tobacco: Never  Vaping Use   Vaping status: Never Used  Substance and Sexual Activity   Alcohol use: No    Alcohol/week: 0.0 standard drinks of alcohol   Drug use: No   Sexual activity: Yes  Other Topics Concern   Not on file  Social History Narrative   She lives her family, she has one child, retired, no smoke no drink   Social Drivers of Corporate investment banker Strain: Low Risk  (11/03/2023)   Overall Financial Resource Strain (CARDIA)    Difficulty of Paying Living Expenses: Not hard at all  Food Insecurity: No Food Insecurity (11/03/2023)   Hunger Vital Sign    Worried About Running Out of Food in the Last Year: Never true    Ran Out of Food in the Last Year: Never true  Transportation Needs: No Transportation Needs (11/03/2023)   PRAPARE - Administrator, Civil Service (Medical): No    Lack of Transportation (Non-Medical): No  Physical Activity: Insufficiently Active (11/03/2023)   Exercise Vital Sign    Days of Exercise per Week: 7 days    Minutes of Exercise per Session: 20 min  Stress: No Stress Concern Present (11/03/2023)   [employer] of Occupational Health - Occupational Stress Questionnaire    Feeling of Stress: Not at all  Social Connections: Moderately Isolated (11/03/2023)   Social Connection and  Isolation Panel    Frequency of Communication with Friends and Family: More than three times a week    Frequency of Social Gatherings with Friends and Family: More than three times a week    Attends Religious Services: Never    Database administrator or Organizations: No    Attends Engineer, structural: Never    Marital Status: Married    Tobacco Counseling Counseling given: Not Answered    Clinical Intake:  Pre-visit preparation completed: Yes  Pain : No/denies pain     BMI - recorded: 17.71 Nutritional Status: BMI <19  Underweight Nutritional Risks: None Diabetes: No  No results found for: HGBA1C   How often do you need to have someone help you when you read instructions, pamphlets, or other written materials from your doctor or pharmacy?: 1 - Never  Interpreter Needed?: No  Information entered by :: R. Marita Burnsed LPN   Activities of Daily Living     11/03/2023    8:51 AM  In your present state of health, do you have any difficulty performing the following activities:  Hearing? 0  Vision? 0  Difficulty concentrating or making decisions? 0  Walking or climbing stairs? 0  Dressing or bathing? 0  Doing errands, shopping? 0  Preparing Food and eating ? N  Using the Toilet? N  In the past six months, have you accidently leaked urine? N  Do you have problems with loss of bowel control? N  Managing your Medications? N  Managing your Finances? N  Housekeeping or managing your Housekeeping? N    Patient Care Team: Marylynn Verneita CROME, MD as PCP - General (Internal Medicine) Burnard Debby LABOR, MD (Inactive) as PCP - Cardiology (Cardiology) Avram Lupita BRAVO, MD as Consulting Physician (Gastroenterology) Hester Alm BROCKS, MD (Dermatology) Barbarann Oneil BROCKS, MD (Inactive) as Consulting Physician (Orthopedic Surgery)  I have updated your Care Teams any recent Medical Services you may have received from other providers in the past year.     Assessment:   This is a  routine wellness examination for [place].  Hearing/Vision screen Hearing Screening -  Comments:: No issues Vision Screening - Comments:: glasses   Goals Addressed             This Visit's Progress    Patient Stated       Needs to gain weight        Depression Screen     11/03/2023    8:56 AM 02/16/2023   11:27 AM 10/07/2022   10:39 AM 04/30/2022   11:00 AM 02/21/2021   10:36 AM 03/20/2020    3:39 PM 02/21/2020    9:36 AM  PHQ 2/9 Scores  PHQ - 2 Score 0  0  0 0 0  PHQ- 9 Score 0  0      Exception Documentation  Patient refusal  Patient refusal       Fall Risk     11/03/2023    8:53 AM 02/16/2023   11:27 AM 10/07/2022   10:43 AM 04/30/2022   11:00 AM 02/21/2021   10:38 AM  Fall Risk   Falls in the past year? 0 0 0 0 0  Number falls in past yr: 0 0 0 0 0  Injury with Fall? 0 0 0 0 0  Risk for fall due to : No Fall Risks No Fall Risks No Fall Risks No Fall Risks   Follow up Falls evaluation completed;Falls prevention discussed Falls evaluation completed Falls prevention discussed Falls evaluation completed Falls evaluation completed      Data saved with a previous flowsheet row definition    MEDICARE RISK AT HOME:  Medicare Risk at Home Any stairs in or around the home?: Yes If so, are there any without handrails?: No Home free of loose throw rugs in walkways, pet beds, electrical cords, etc?: Yes Adequate lighting in your home to reduce risk of falls?: Yes Life alert?: No Use of a cane, walker or w/c?: No Grab bars in the bathroom?: Yes Shower chair or bench in shower?: No Elevated toilet seat or a handicapped toilet?: No  TIMED UP AND GO:  Was the test performed?  No  Cognitive Function: 6CIT completed    03/23/2016    1:20 PM  MMSE - Mini Mental State Exam  Orientation to time 5   Orientation to Place 5   Registration 3   Attention/ Calculation 5   Recall 3   Language- name 2 objects 2   Language- repeat 1  Language- follow 3 step command 3   Language-  read & follow direction 1   Write a sentence 1   Copy design 1   Total score 30      Data saved with a previous flowsheet row definition        11/03/2023    9:01 AM 10/07/2022   10:44 AM 02/21/2021   10:39 AM  6CIT Screen  What Year? 0 points 0 points 0 points  What month? 0 points 0 points 0 points  What time? 0 points 0 points 0 points  Count back from 20 0 points 0 points   Months in reverse 0 points 2 points 0 points  Repeat phrase 0 points 0 points   Total Score 0 points 2 points     Immunizations Immunization History  Administered Date(s) Administered   Fluad Quad(high Dose 65+) 11/24/2018   Fluad Trivalent(High Dose 65+) 12/07/2022   INFLUENZA, HIGH DOSE SEASONAL PF 11/11/2015, 12/27/2017   Influenza Split 11/27/2010, 11/05/2011, 12/09/2012, 11/09/2013   Influenza Whole 02/09/2009   Influenza-Unspecified 12/08/2014   PFIZER(Purple Top)SARS-COV-2 Vaccination 03/13/2019, 04/03/2019,  12/11/2019   Pneumococcal Conjugate-13 03/18/2015   Pneumococcal Polysaccharide-23 12/10/2011   Tdap 07/09/2015    Screening Tests Health Maintenance  Topic Date Due   Zoster Vaccines- Shingrix (1 of 2) Never done   Influenza Vaccine  09/10/2023   Medicare Annual Wellness (AWV)  10/07/2023   COVID-19 Vaccine (4 - 2025-26 season) 10/11/2023   Mammogram  11/27/2023   DTaP/Tdap/Td (2 - Td or Tdap) 07/08/2025   Pneumococcal Vaccine: 50+ Years  Completed   DEXA SCAN  Completed   HPV VACCINES  Aged Out   Meningococcal B Vaccine  Aged Out   Colonoscopy  Discontinued   Hepatitis C Screening  Discontinued    Health Maintenance Items Addressed: Mammogram ordered Discussed the need to update flu, covid and shingles vaccines. Patient declines Dexa scan.  Additional Screening:  Vision Screening: Recommended annual ophthalmology exams for early detection of glaucoma and other disorders of the eye. Is the patient up to date with their annual eye exam?  Yes  Who is the provider or what is  the name of the office in which the patient attends annual eye exams?  [facility]  Dental Screening: Recommended annual dental exams for proper oral hygiene  Community Resource Referral / Chronic Care Management: CRR required this visit?  No   CCM required this visit?  No   Plan:    I have personally reviewed and noted the following in the patient's chart:   Medical and social history Use of alcohol, tobacco or illicit drugs  Current medications and supplements including opioid prescriptions. Patient is not currently taking opioid prescriptions. Functional ability and status Nutritional status Physical activity Advanced directives List of other physicians Hospitalizations, surgeries, and ER visits in previous 12 months Vitals Screenings to include cognitive, depression, and falls Referrals and appointments  In addition, I have reviewed and discussed with patient certain preventive protocols, quality metrics, and best practice recommendations. A written personalized care plan for preventive services as well as general preventive health recommendations were provided to patient.   Angeline Fredericks, LPN   [id number]   After Visit Summary: (Pick Up) Due to this being a telephonic visit, with patients personalized plan was offered to patient and patient has requested to Pick up at office.  Notes: Nothing significant to report at this time.

## 2023-12-13 ENCOUNTER — Other Ambulatory Visit: Payer: Self-pay | Admitting: General Practice

## 2023-12-13 ENCOUNTER — Other Ambulatory Visit: Payer: Self-pay | Admitting: Cardiology

## 2023-12-13 ENCOUNTER — Ambulatory Visit

## 2023-12-13 DIAGNOSIS — E538 Deficiency of other specified B group vitamins: Secondary | ICD-10-CM | POA: Diagnosis not present

## 2023-12-13 MED ORDER — CYANOCOBALAMIN 1000 MCG/ML IJ SOLN
1000.0000 ug | Freq: Once | INTRAMUSCULAR | Status: AC
  Administered 2023-12-13: 1000 ug via INTRAMUSCULAR

## 2023-12-13 NOTE — Progress Notes (Signed)
 Pt presented for their vitamin B12 injection. Pt was identified through two identifiers. Pt tolerated shot well in their right deltoid.

## 2023-12-14 ENCOUNTER — Telehealth: Payer: Self-pay | Admitting: Cardiology

## 2023-12-14 NOTE — Telephone Encounter (Signed)
*  STAT* If patient is at the pharmacy, call can be transferred to refill team.   1. Which medications need to be refilled? (please list name of each medication and dose if known)   amLODipine  (NORVASC ) 5 MG tablet  losartan  (COZAAR ) 100 MG tablet   2. Which pharmacy/location (including street and city if local pharmacy) is medication to be sent to?  [id number] - [place], [place] - [address]      3. Do they need a 30 day or 90 day supply? 90 day   Pt is out of medication

## 2023-12-15 MED ORDER — AMLODIPINE BESYLATE 5 MG PO TABS: ORAL_TABLET | ORAL | 1 refills | Status: AC

## 2023-12-15 MED ORDER — LOSARTAN POTASSIUM 100 MG PO TABS: 100.0000 mg | ORAL_TABLET | Freq: Every day | ORAL | 1 refills | Status: AC

## 2023-12-15 NOTE — Telephone Encounter (Signed)
 Looks like when prescriptions was sent in March 2025, transmission failed.  Sent in 90 day supply of Losartan  100 daily and Amlodipine  5 mg daily with 1 additional refill.  Pt aware.  Pt scheduled to see Dr. Kate in March 2026.

## 2024-01-12 ENCOUNTER — Ambulatory Visit

## 2024-01-12 DIAGNOSIS — E538 Deficiency of other specified B group vitamins: Secondary | ICD-10-CM | POA: Diagnosis not present

## 2024-01-12 MED ORDER — CYANOCOBALAMIN 1000 MCG/ML IJ SOLN
1000.0000 ug | Freq: Once | INTRAMUSCULAR | Status: AC
  Administered 2024-01-12: 1000 ug via INTRAMUSCULAR

## 2024-01-12 NOTE — Progress Notes (Signed)
 Patient was administered a B12 injection into her left deltoid. Patient tolerated the B12 injection well.

## 2024-02-14 ENCOUNTER — Ambulatory Visit

## 2024-02-21 ENCOUNTER — Ambulatory Visit

## 2024-02-21 DIAGNOSIS — E538 Deficiency of other specified B group vitamins: Secondary | ICD-10-CM | POA: Diagnosis not present

## 2024-02-21 MED ORDER — CYANOCOBALAMIN 1000 MCG/ML IJ SOLN
1000.0000 ug | Freq: Once | INTRAMUSCULAR | Status: AC
  Administered 2024-02-21: 1000 ug via INTRAMUSCULAR

## 2024-02-21 NOTE — Progress Notes (Signed)
 Pt presented for their vitamin B12 injection. Pt was identified through two identifiers. Pt tolerated shot well in their right deltoid.

## 2024-03-27 ENCOUNTER — Ambulatory Visit

## 2024-04-13 ENCOUNTER — Ambulatory Visit: Admitting: Cardiology

## 2024-10-26 ENCOUNTER — Ambulatory Visit: Admitting: Dermatology

## 2024-11-07 ENCOUNTER — Ambulatory Visit
